# Patient Record
Sex: Female | Born: 1935 | Race: White | Hispanic: No | Marital: Married | Smoking: Never smoker
Health system: Southern US, Community
[De-identification: ages and names within clinical notes are randomized; demographics above are authoritative.]

## PROBLEM LIST (undated history)

## (undated) DIAGNOSIS — E119 Type 2 diabetes mellitus without complications: Secondary | ICD-10-CM

## (undated) DIAGNOSIS — D329 Benign neoplasm of meninges, unspecified: Secondary | ICD-10-CM

## (undated) DIAGNOSIS — I2510 Atherosclerotic heart disease of native coronary artery without angina pectoris: Secondary | ICD-10-CM

## (undated) DIAGNOSIS — I4891 Unspecified atrial fibrillation: Secondary | ICD-10-CM

## (undated) DIAGNOSIS — I10 Essential (primary) hypertension: Secondary | ICD-10-CM

## (undated) DIAGNOSIS — I5030 Unspecified diastolic (congestive) heart failure: Secondary | ICD-10-CM

## (undated) HISTORY — PX: OTHER SURGICAL HISTORY: SHX169

## (undated) HISTORY — PX: APPENDECTOMY: SHX54

## (undated) HISTORY — DX: Unspecified atrial fibrillation: I48.91

## (undated) HISTORY — DX: Benign neoplasm of meninges, unspecified: D32.9

## (undated) HISTORY — PX: CHOLECYSTECTOMY: SHX55

---

## 2014-05-19 DIAGNOSIS — E118 Type 2 diabetes mellitus with unspecified complications: Secondary | ICD-10-CM | POA: Insufficient documentation

## 2014-05-19 DIAGNOSIS — Z8781 Personal history of (healed) traumatic fracture: Secondary | ICD-10-CM | POA: Insufficient documentation

## 2014-05-19 DIAGNOSIS — D320 Benign neoplasm of cerebral meninges: Secondary | ICD-10-CM | POA: Insufficient documentation

## 2014-05-19 DIAGNOSIS — Z8603 Personal history of neoplasm of uncertain behavior: Secondary | ICD-10-CM | POA: Insufficient documentation

## 2014-12-06 DIAGNOSIS — I10 Essential (primary) hypertension: Secondary | ICD-10-CM | POA: Insufficient documentation

## 2015-02-01 ENCOUNTER — Encounter: Payer: Self-pay | Admitting: *Deleted

## 2015-02-01 ENCOUNTER — Emergency Department: Payer: Medicare Other

## 2015-02-01 ENCOUNTER — Emergency Department
Admission: EM | Admit: 2015-02-01 | Discharge: 2015-02-01 | Disposition: A | Discharge status: Home / Self Care | Payer: Medicare Other | Attending: Emergency Medicine | Admitting: Emergency Medicine

## 2015-02-01 DIAGNOSIS — W01198A Fall on same level from slipping, tripping and stumbling with subsequent striking against other object, initial encounter: Secondary | ICD-10-CM | POA: Diagnosis not present

## 2015-02-01 DIAGNOSIS — S233XXA Sprain of ligaments of thoracic spine, initial encounter: Secondary | ICD-10-CM | POA: Insufficient documentation

## 2015-02-01 DIAGNOSIS — Y9389 Activity, other specified: Secondary | ICD-10-CM | POA: Diagnosis not present

## 2015-02-01 DIAGNOSIS — S29012A Strain of muscle and tendon of back wall of thorax, initial encounter: Secondary | ICD-10-CM

## 2015-02-01 DIAGNOSIS — Y998 Other external cause status: Secondary | ICD-10-CM | POA: Insufficient documentation

## 2015-02-01 DIAGNOSIS — S0081XA Abrasion of other part of head, initial encounter: Secondary | ICD-10-CM | POA: Diagnosis not present

## 2015-02-01 DIAGNOSIS — R609 Edema, unspecified: Secondary | ICD-10-CM

## 2015-02-01 DIAGNOSIS — S6291XA Unspecified fracture of right wrist and hand, initial encounter for closed fracture: Secondary | ICD-10-CM | POA: Insufficient documentation

## 2015-02-01 DIAGNOSIS — I10 Essential (primary) hypertension: Secondary | ICD-10-CM | POA: Diagnosis not present

## 2015-02-01 DIAGNOSIS — W19XXXA Unspecified fall, initial encounter: Secondary | ICD-10-CM

## 2015-02-01 DIAGNOSIS — S39012A Strain of muscle, fascia and tendon of lower back, initial encounter: Secondary | ICD-10-CM | POA: Diagnosis not present

## 2015-02-01 DIAGNOSIS — S0083XA Contusion of other part of head, initial encounter: Secondary | ICD-10-CM

## 2015-02-01 DIAGNOSIS — Y9289 Other specified places as the place of occurrence of the external cause: Secondary | ICD-10-CM | POA: Diagnosis not present

## 2015-02-01 DIAGNOSIS — E119 Type 2 diabetes mellitus without complications: Secondary | ICD-10-CM | POA: Insufficient documentation

## 2015-02-01 DIAGNOSIS — S6991XA Unspecified injury of right wrist, hand and finger(s), initial encounter: Secondary | ICD-10-CM | POA: Diagnosis present

## 2015-02-01 DIAGNOSIS — S62101A Fracture of unspecified carpal bone, right wrist, initial encounter for closed fracture: Secondary | ICD-10-CM

## 2015-02-01 DIAGNOSIS — R52 Pain, unspecified: Secondary | ICD-10-CM

## 2015-02-01 HISTORY — DX: Essential (primary) hypertension: I10

## 2015-02-01 HISTORY — DX: Type 2 diabetes mellitus without complications: E11.9

## 2015-02-01 HISTORY — DX: Atherosclerotic heart disease of native coronary artery without angina pectoris: I25.10

## 2015-02-01 MED ORDER — TRAMADOL HCL 50 MG PO TABS: 50.0000 mg | ORAL_TABLET | Freq: Two times a day (BID) | ORAL | Status: DC

## 2015-02-01 MED ORDER — IBUPROFEN 200 MG PO TABS: 200.0000 mg | ORAL_TABLET | Freq: Four times a day (QID) | ORAL | Status: DC | PRN

## 2015-02-01 MED ORDER — ACETAMINOPHEN 500 MG PO TABS
1000.0000 mg | ORAL_TABLET | Freq: Four times a day (QID) | ORAL | Status: DC | PRN
  Administered 2015-02-01: 1000 mg via ORAL
  Filled 2015-02-01: qty 2

## 2015-02-01 NOTE — ED Provider Notes (Signed)
[facility] Emergency Department Provider Note  ____________________________________________  Time seen: Approximately 2:39 PM  I have reviewed the triage vital signs and the nursing notes.   HISTORY  Chief Complaint Fall   HPI [name] is a 81 y.o. female is brought in here from [facility].  Patient states she fell over a trolley when it rolled out in front of her. This caused her to fall forward striking her chin. She also is complaining of pain to her right wrist and mid back. She denies any head injury or loss of consciousness. She states that her to use currently or fitting normally. She denies any visual changes. She denies any nausea or vomiting. Really she rates her pain a 5 out of 10. She denies any previous injury to her right wrist.She was brought to the emergency room by her brother who is not present during the exam.   Past Medical History  Diagnosis Date  . Diabetes mellitus without complication   . Coronary artery disease   . Hypertension     There are no active problems to display for this patient.   Past Surgical History  Procedure Laterality Date  . Cholecystectomy    . Appendectomy      Current Outpatient Rx  Name  Route  Sig  Dispense  Refill  . ibuprofen (ADVIL) 200 MG tablet   Oral   Take 1 tablet (200 mg total) by mouth every 6 (six) hours as needed.   30 tablet   0   . traMADol (ULTRAM) 50 MG tablet   Oral   Take 1 tablet (50 mg total) by mouth 2 (two) times daily.   12 tablet   0     Allergies Review of patient's allergies indicates no known allergies.  No family history on file.  Social History Social History  Substance Use Topics  . Smoking status: Never Smoker   . Smokeless tobacco: None  . Alcohol Use: Yes    Review of Systems Constitutional: No fever/chills Eyes: No visual changes. ENT: No nose or throat pain Cardiovascular: Denies chest pain. Respiratory: Denies shortness of  breath. Gastrointestinal: No abdominal pain.  No nausea, no vomiting.  Genitourinary: Negative for dysuria. Musculoskeletal: Positive for back pain. Skin: Negative for rash. Positive for ecchymosis and swelling of 10 Neurological: Negative for headaches, focal weakness or numbness.  10-point ROS otherwise negative.  ____________________________________________   PHYSICAL EXAM:  VITAL SIGNS: ED Triage Vitals  Enc Vitals Group     BP 02/01/15 1326 185/159 mmHg     Pulse Rate 02/01/15 1326 69     Resp 02/01/15 1326 16     Temp 02/01/15 1326 98.8 F (37.1 C)     Temp Source 02/01/15 1326 Oral     SpO2 02/01/15 1326 97 %     Weight 02/01/15 1326 193 lb (87.544 kg)     Height 02/01/15 1326 5\' 9"  (1.753 m)     Head Cir --      Peak Flow --      Pain Score 02/01/15 1323 5     Pain Loc --      Pain Edu? --      Excl. in [place]? --     Constitutional: Alert and oriented. Well appearing and in no acute distress. Eyes: Conjunctivae are normal. PERRL. EOMI. Head: Atraumatic. Nose: No congestion/rhinnorhea. Neck: No stridor.  No cervical tenderness on palpation and range of motion is nonrestricted Hematological/Lymphatic/Immunilogical: No cervical lymphadenopathy. Cardiovascular: Normal rate,  regular rhythm. Grossly normal heart sounds.  Good peripheral circulation. Respiratory: Normal respiratory effort.  No retractions. Lungs CTAB. Nontender on palpation of the ribs. Gastrointestinal: Soft and nontender. No distention. No abdominal bruits. Musculoskeletal: Facial bones were nontender to palpation with the exception of the 10. Patient is talking without any difficulty. Right wrist is with swelling and minimal deformity. Range of motion is restricted secondary to pain. It is moderate tenderness on palpation. Motor and sensory function intact distally to the injury. Lower thoracic and lumbar area palpated with moderate tenderness on palpation. Bilateral hips were nontender. No tenderness was  noted on palpation of the knees and ankles or feet. No lower extremity tenderness nor edema.  No joint effusions. Neurologic:  Normal speech and language. No gross focal neurologic deficits are appreciated. No gait instability. Skin:  Skin is warm, dry and intact. No rash noted. There is positive abrasion and ecchymosis of the anterior chin Psychiatric: Mood and affect are normal. Speech and behavior are normal.  ____________________________________________   LABS (all labs ordered are listed, but only abnormal results are displayed)  Labs Reviewed - No data to display  RADIOLOGY  CT maxillofacial shows soft tissue swelling anterior mandible but no fractures were seen. CT of cervical spine showed multiple level degenerative changes but no acute fracture or soft tissue swelling. Per radiologist.  Right hand showed acute distal radius and ulna styloid fractures per radiologist and reviewed by me. Thoracic spine x-ray no acute fracture or subluxation per radiologist Lumbar spine showed multilevel degenerative disc disease with lumbar spondylosis. No acute fractures. Right forearm showed fractures of the distal radius and also styloid I, Johnn Hai, personally viewed and evaluated these images (plain radiographs) as part of my medical decision making.   ____________________________________________   PROCEDURES  Procedure(s) performed: None  Critical Care performed: No  ____________________________________________   INITIAL IMPRESSION / ASSESSMENT AND PLAN / ED COURSE  Pertinent labs & imaging results that were available during my care of the patient were reviewed by me and considered in my medical decision making (see chart for details)  CT reports were pending prior to shift change and the remainder of patient's care was turned over to [employer]. Initially patient was extremely upset over forearm x-ray being ordered prior to being seen by a provider. After being seen by  this provider she had multiple other pain complaints related to her fall. Patient refused narcotics prior to x-ray. She was given Tylenol 2 tablets to help with her pain.   ____________________________________________   FINAL CLINICAL IMPRESSION(S) / ED DIAGNOSES  Final diagnoses:  Swelling  Pain  Right wrist fracture, closed, initial encounter  Facial contusion, initial encounter  Upper back strain, initial encounter  Low back strain, initial encounter      Johnn Hai, PA-C 02/02/15 1412  Earleen Newport, MD 02/05/15 [phone]

## 2015-02-01 NOTE — Discharge Instructions (Signed)
Wear splint until evaluation by [facility]

## 2015-02-01 NOTE — ED Notes (Signed)
Pt reports fell over a trolley that rolled in front of her. Pain to right wrist and mid back pain. Bruising noted to chin. No LOC. Pt has ace wrap on wrist from [facility].

## 2015-02-01 NOTE — ED Provider Notes (Signed)
-----------------------------------------   4:44 PM on 02/01/2015 -----------------------------------------   Blood pressure 185/159, pulse 69, temperature 98.8 F (37.1 C), temperature source Oral, resp. rate 16, height 5\' 9"  (1.753 m), weight 193 lb (87.544 kg), SpO2 97 %.  Assuming care from [facility].  In short, [name] is a 81 y.o. female with a chief complaint of Fall .  Refer to the original H&P for additional details.  The current plan of care is to splint the patient's rigtht wrist 2nd to distal radial/ulnar styloid fracture. Advised patient no other acute findings from X-rays. and Ct Scans.Patient advised to follow up with [facility]o Clinic for re-evaluation of fracture right wrist. Patient given prescription for tramadol and ibuprofen.   Sable Feil, PA-C 02/01/15 1658  Nance Pear, MD 02/01/15 1754

## 2015-02-12 ENCOUNTER — Emergency Department
Admission: EM | Admit: 2015-02-12 | Discharge: 2015-02-12 | Disposition: A | Discharge status: Home / Self Care | Payer: Medicare Other | Attending: Emergency Medicine | Admitting: Emergency Medicine

## 2015-02-12 ENCOUNTER — Emergency Department: Payer: Medicare Other

## 2015-02-12 ENCOUNTER — Encounter: Payer: Self-pay | Admitting: Emergency Medicine

## 2015-02-12 ENCOUNTER — Other Ambulatory Visit: Payer: Self-pay

## 2015-02-12 DIAGNOSIS — W01198A Fall on same level from slipping, tripping and stumbling with subsequent striking against other object, initial encounter: Secondary | ICD-10-CM | POA: Diagnosis not present

## 2015-02-12 DIAGNOSIS — S0003XA Contusion of scalp, initial encounter: Secondary | ICD-10-CM | POA: Insufficient documentation

## 2015-02-12 DIAGNOSIS — S0990XA Unspecified injury of head, initial encounter: Secondary | ICD-10-CM | POA: Diagnosis present

## 2015-02-12 DIAGNOSIS — Y998 Other external cause status: Secondary | ICD-10-CM | POA: Diagnosis not present

## 2015-02-12 DIAGNOSIS — E119 Type 2 diabetes mellitus without complications: Secondary | ICD-10-CM | POA: Diagnosis not present

## 2015-02-12 DIAGNOSIS — I10 Essential (primary) hypertension: Secondary | ICD-10-CM | POA: Insufficient documentation

## 2015-02-12 DIAGNOSIS — Y92481 Parking lot as the place of occurrence of the external cause: Secondary | ICD-10-CM | POA: Insufficient documentation

## 2015-02-12 DIAGNOSIS — Y9389 Activity, other specified: Secondary | ICD-10-CM | POA: Diagnosis not present

## 2015-02-12 NOTE — ED Notes (Addendum)
Pt has cast in place to right arm, bruising noted above right eye

## 2015-02-12 NOTE — Discharge Instructions (Signed)
Head Injury °You have received a head injury. It does not appear serious at this time. Headaches and vomiting are common following head injury. It should be easy to awaken from sleeping. Sometimes it is necessary for you to stay in the emergency department for a while for observation. Sometimes admission to the hospital may be needed. After injuries such as yours, most problems occur within the first 24 hours, but side effects may occur up to 7-10 days after the injury. It is important for you to carefully monitor your condition and contact your health care provider or seek immediate medical care if there is a change in your condition. °WHAT ARE THE TYPES OF HEAD INJURIES? °Head injuries can be as minor as a bump. Some head injuries can be more severe. More severe head injuries include: °· A jarring injury to the brain (concussion). °· A bruise of the brain (contusion). This mean there is bleeding in the brain that can cause swelling. °· A cracked skull (skull fracture). °· Bleeding in the brain that collects, clots, and forms a bump (hematoma). °WHAT CAUSES A HEAD INJURY? °A serious head injury is most likely to happen to someone who is in a car wreck and is not wearing a seat belt. Other causes of major head injuries include bicycle or motorcycle accidents, sports injuries, and falls. °HOW ARE HEAD INJURIES DIAGNOSED? °A complete history of the event leading to the injury and your current symptoms will be helpful in diagnosing head injuries. Many times, pictures of the brain, such as CT or MRI are needed to see the extent of the injury. Often, an overnight hospital stay is necessary for observation.  °WHEN SHOULD I SEEK IMMEDIATE MEDICAL CARE?  °You should get help right away if: °· You have confusion or drowsiness. °· You feel sick to your stomach (nauseous) or have continued, forceful vomiting. °· You have dizziness or unsteadiness that is getting worse. °· You have severe, continued headaches not relieved by  medicine. Only take over-the-counter or prescription medicines for pain, fever, or discomfort as directed by your health care provider. °· You do not have normal function of the arms or legs or are unable to walk. °· You notice changes in the black spots in the center of the colored part of your eye (pupil). °· You have a clear or bloody fluid coming from your nose or ears. °· You have a loss of vision. °During the next 24 hours after the injury, you must stay with someone who can watch you for the warning signs. This person should contact local emergency services (911 in the [place] if you have seizures, you become unconscious, or you are unable to wake up. °HOW CAN I PREVENT A HEAD INJURY IN THE FUTURE? °The most important factor for preventing major head injuries is avoiding motor vehicle accidents.  To minimize the potential for damage to your head, it is crucial to wear seat belts while riding in motor vehicles. Wearing helmets while bike riding and playing collision sports (like football) is also helpful. Also, avoiding dangerous activities around the house will further help reduce your risk of head injury.  °WHEN CAN I RETURN TO NORMAL ACTIVITIES AND ATHLETICS? °You should be reevaluated by your health care provider before returning to these activities. If you have any of the following symptoms, you should not return to activities or contact sports until 1 week after the symptoms have stopped: °· Persistent headache. °· Dizziness or vertigo. °· Poor attention and concentration. °· Confusion. °·   Memory problems.  Nausea or vomiting.  Fatigue or tire easily.  Irritability.  Intolerant of bright lights or loud noises.  Anxiety or depression.  Disturbed sleep. MAKE SURE YOU:   Understand these instructions.  Will watch your condition.  Will get help right away if you are not doing well or get worse. Document Released: 04/28/2005 Document Revised: 05/03/2013 Document Reviewed:  01/03/2013 [facility] Patient Information 2015 [place], [place]. This information is not intended to replace advice given to you by your health care provider. Make sure you discuss any questions you have with your health care provider.  Facial or Scalp Contusion  A facial or scalp contusion is a deep bruise on the face or head. Contusions happen when an injury causes bleeding under the skin. Signs of bruising include pain, puffiness (swelling), and discolored skin. The contusion may turn blue, purple, or yellow. HOME CARE  Only take medicines as told by your doctor.  Put ice on the injured area.  Put ice in a plastic bag.  Place a towel between your skin and the bag.  Leave the ice on for 20 minutes, 2-3 times a day. GET HELP IF:  You have bite problems.  You have pain when chewing.  You are worried about your face not healing normally. GET HELP RIGHT AWAY IF:   You have severe pain or a headache and medicine does not help.  You are very tired or confused, or your personality changes.  You throw up (vomit).  You have a nosebleed that will not stop.  You see two of everything (double vision) or have blurry vision.  You have fluid coming from your nose or ear.  You have problems walking or using your arms or legs. MAKE SURE YOU:   Understand these instructions.  Will watch your condition.  Will get help right away if you are not doing well or get worse. Document Released: 04/17/2011 Document Revised: 02/16/2013 Document Reviewed: 12/09/2012 [facility] Patient Information 2015 [place], [place]. This information is not intended to replace advice given to you by your health care provider. Make sure you discuss any questions you have with your health care provider.

## 2015-02-12 NOTE — ED Notes (Signed)
Dr Jimmye Norman went reevaluate pt, pt c/o of back pain, new orders.  Await DC.

## 2015-02-12 NOTE — ED Provider Notes (Addendum)
[facility] Emergency Department Provider Note     Time seen: ----------------------------------------- 10:57 AM on 02/12/2015 -----------------------------------------    I have reviewed the triage vital signs and the nursing notes.   HISTORY  Chief Complaint Fall    HPI [name] is a 81 y.o. female who presents ER for a fall. Patient states she was bending over working on the dishwasher when she lost her balance and struck her head on the floor. Patient states she was in the floor throughout the night, her neighbor. She fell on September 23 in a parking lot when legs and fractured her right wrist and bruised her chin. He complains currently was had pain in right frontal scalp trauma.   Past Medical History  Diagnosis Date  . Diabetes mellitus without complication   . Coronary artery disease   . Hypertension     There are no active problems to display for this patient.   Past Surgical History  Procedure Laterality Date  . Cholecystectomy    . Appendectomy      Allergies Review of patient's allergies indicates no known allergies.  Social History Social History  Substance Use Topics  . Smoking status: Never Smoker   . Smokeless tobacco: Not on file  . Alcohol Use: Yes    Review of Systems Constitutional: Negative for fever. Eyes: Negative for visual changes. ENT: Negative for sore throat. Cardiovascular: Negative for chest pain. Respiratory: Negative for shortness of breath. Gastrointestinal: Negative for abdominal pain, vomiting and diarrhea. Genitourinary: Negative for dysuria. Musculoskeletal: Negative for back pain. Skin: Negative for rash. Neurological: Positive for headache  10-point ROS otherwise negative.  ____________________________________________   PHYSICAL EXAM:  VITAL SIGNS: ED Triage Vitals  Enc Vitals Group     BP --      Pulse --      Resp --      Temp --      Temp src --      SpO2 --      Weight  --      Height --      Head Cir --      Peak Flow --      Pain Score --      Pain Loc --      Pain Edu? --      Excl. in [place]? --     Constitutional: Alert and oriented. Well appearing and in no distress. Eyes: Conjunctivae are normal. PERRL. Normal extraocular movements. ENT   Head: Right periorbital scalp hematoma is evident. There is ecchymosis to the chin that she states is from remote fall   Nose: No congestion/rhinnorhea.   Mouth/Throat: Mucous membranes are moist.   Neck: No stridor. Cardiovascular: Normal rate, regular rhythm. Normal and symmetric distal pulses are present in all extremities. No murmurs, rubs, or gallops. Respiratory: Normal respiratory effort without tachypnea nor retractions. Breath sounds are clear and equal bilaterally. No wheezes/rales/rhonchi. Gastrointestinal: Soft and nontender. No distention. No abdominal bruits.  Musculoskeletal: Nontender with normal range of motion in all extremities with the exception of her right forearm and wrist that is in a cast Neurologic:  Normal speech and language. No gross focal neurologic deficits are appreciated. Speech is normal. No gait instability. Skin:  Skin is warm, dry and intact. No rash noted. Psychiatric: Mood and affect are normal. Speech and behavior are normal. Patient exhibits appropriate insight and judgment. ____________________________________________  EKG: Interpreted by me. Normal sinus rhythm with rate of 81 bpm, normal PR interval,  normal QS with, normal QT interval. Nonspecific ST-T wave changes.  ____________________________________________  ED COURSE:  Pertinent labs & imaging results that were available during my care of the patient were reviewed by me and considered in my medical decision making (see chart for details). Patient is in no acute distress, repeat fall. We'll obtain CT imaging. She is alert and oriented, denies any other complaints does not want pain medicine this  time. ____________________________________________   RADIOLOGY Images were viewed by me  CT head IMPRESSION: 1. Right forehead/scalp hematoma without underlying fracture. 2. Three cm right anterior convexity calcified meningioma re- identified with stable mass effect and vasogenic edema in the anterior right frontal lobe. 3. No new intracranial abnormality.  IMPRESSION: 1. No acute fracture or listhesis identified in the lumbar spine. 2. Widespread calcified atherosclerosis of the aorta. ____________________________________________  FINAL ASSESSMENT AND PLAN  Fall, minor head injury, scalp hematoma  Plan: Patient with imaging as dictated above. Here patient is stable, no serious injuries are appreciated. She is stable for outpatient follow-up with her doctor. Patient had been having some back spasms, therefore imaging or lumbar sacral spine was performed. There was no acute fracture, she stable for discharge   Earleen Newport, MD   Earleen Newport, MD 02/12/15 [place], MD 02/12/15 1236

## 2015-02-12 NOTE — ED Notes (Addendum)
Pt to ED via EMS transport after falling last night and hitting head on the floor, states she was in the floor throughout the night and found by her neighbor this am, pt states she fell on the 9/23 in the parking lot at [facility] and fx right wrist and bruised chin, today pt has bruising above right eye and c/o pain in lower back, a&o x 4 upon arrival to ED, states she thinks she may have blacked out at time of fall

## 2015-06-25 DIAGNOSIS — E1121 Type 2 diabetes mellitus with diabetic nephropathy: Secondary | ICD-10-CM | POA: Insufficient documentation

## 2015-08-01 ENCOUNTER — Encounter: Payer: Self-pay | Admitting: Podiatry

## 2015-08-01 ENCOUNTER — Ambulatory Visit (INDEPENDENT_AMBULATORY_CARE_PROVIDER_SITE_OTHER): Payer: Medicare Other | Admitting: Podiatry

## 2015-08-01 ENCOUNTER — Ambulatory Visit: Payer: Medicare Other

## 2015-08-01 VITALS — BP 147/88 | HR 80 | Resp 16

## 2015-08-01 DIAGNOSIS — E119 Type 2 diabetes mellitus without complications: Secondary | ICD-10-CM

## 2015-08-01 DIAGNOSIS — M79676 Pain in unspecified toe(s): Secondary | ICD-10-CM | POA: Diagnosis not present

## 2015-08-01 DIAGNOSIS — L84 Corns and callosities: Secondary | ICD-10-CM | POA: Diagnosis not present

## 2015-08-01 DIAGNOSIS — B351 Tinea unguium: Secondary | ICD-10-CM | POA: Diagnosis not present

## 2015-08-01 NOTE — Addendum Note (Signed)
Addended by: Rip Harbour on: 08/01/2015 01:31 PM   Modules accepted: Orders

## 2015-08-01 NOTE — Progress Notes (Signed)
   Subjective:    Patient ID: [name], female    DOB: [birth year], 82 y.o.   MRN: [id number]  HPI: She presents today with a chief complaint of painful elongated toenails and calluses to the distal aspect of the third digits bilaterally. She states that a year and a half ago she moved from [place] to hear and has not yet developed a relationship with a podiatrist. She relates therapy really don't hurt that is big and flat.    Review of Systems  HENT: Positive for hearing loss.   Cardiovascular: Positive for leg swelling.  Genitourinary: Positive for frequency.  Musculoskeletal: Positive for myalgias, back pain and gait problem.  Neurological: Positive for numbness.  All other systems reviewed and are negative.      Objective:   Physical Exam: Vital signs are stable she is alert and oriented 3 no apparent distress. Pulses are strongly palpable. Neurologic sensorium is intact. Deep tendon reflexes are not elicitable muscle strength +5 over 5 dorsiflexion plantar flexors and inverters everters onto the musculatures intact. Orthopedic evaluation demonstrates pes planus bilateral mallet to deformity third bilateral toenails are thick yellow dystrophic onychomycotic. Porokeratotic lesion or distal clavus third digit bilaterally. No open lesions or wounds. Patient deferred x-rays.        Assessment & Plan:  Diabetic foot care with mallet toe deformity third bilateral distal clavus. Non-complicated. Toenails are thick and painful.  Plan: Debridement of toenails and calluses bilateral.

## 2015-09-10 DIAGNOSIS — I358 Other nonrheumatic aortic valve disorders: Secondary | ICD-10-CM | POA: Insufficient documentation

## 2015-11-05 ENCOUNTER — Ambulatory Visit (INDEPENDENT_AMBULATORY_CARE_PROVIDER_SITE_OTHER): Payer: Medicare Other | Admitting: Podiatry

## 2015-11-05 ENCOUNTER — Encounter: Payer: Self-pay | Admitting: Podiatry

## 2015-11-05 DIAGNOSIS — B351 Tinea unguium: Secondary | ICD-10-CM

## 2015-11-05 DIAGNOSIS — E119 Type 2 diabetes mellitus without complications: Secondary | ICD-10-CM | POA: Diagnosis not present

## 2015-11-05 DIAGNOSIS — M79676 Pain in unspecified toe(s): Secondary | ICD-10-CM

## 2015-11-05 NOTE — Progress Notes (Signed)
She presents today with chief complaint of painful elongated toenails.  Objective: Vital signs are stable she is alert and oriented 3 pulses remain palpable neurologic sensorium is intact. Toenails are thick yellow dystrophic like mycotic.  Assessment: Pain and limp secondary to onychomycosis.  Plan: Debridement of toenails 1 through 5 bilateral covered service secondary to pain.

## 2016-01-30 ENCOUNTER — Ambulatory Visit: Payer: Medicare Other | Admitting: Podiatry

## 2016-06-27 DIAGNOSIS — M549 Dorsalgia, unspecified: Secondary | ICD-10-CM | POA: Insufficient documentation

## 2016-07-14 DIAGNOSIS — I4891 Unspecified atrial fibrillation: Secondary | ICD-10-CM | POA: Insufficient documentation

## 2016-07-14 DIAGNOSIS — I4819 Other persistent atrial fibrillation: Secondary | ICD-10-CM | POA: Insufficient documentation

## 2016-07-23 ENCOUNTER — Ambulatory Visit: Payer: Medicare Other | Attending: Orthopedic Surgery | Admitting: Physical Therapy

## 2016-07-23 DIAGNOSIS — G8929 Other chronic pain: Secondary | ICD-10-CM | POA: Diagnosis present

## 2016-07-23 DIAGNOSIS — R262 Difficulty in walking, not elsewhere classified: Secondary | ICD-10-CM | POA: Insufficient documentation

## 2016-07-23 DIAGNOSIS — M545 Low back pain: Secondary | ICD-10-CM | POA: Insufficient documentation

## 2016-07-23 DIAGNOSIS — R5381 Other malaise: Secondary | ICD-10-CM | POA: Diagnosis present

## 2016-07-24 NOTE — Therapy (Signed)
[facility] [address], [place], [address] Phone: [phone]   Fax:  [phone]  Physical Therapy Evaluation  Patient Details  Name: [name] MRN: [id number] Date of Birth: [birth year] No Data Recorded  Encounter Date: 07/23/2016      PT End of Session - 07/23/16 1432    Visit Number 1   Number of Visits 17   Date for PT Re-Evaluation 10/15/16   PT Start Time 1127   PT Stop Time 1205   PT Time Calculation (min) 38 min   Activity Tolerance Patient tolerated treatment well   Behavior During Therapy [facility] for tasks assessed/performed      Past Medical History:  Diagnosis Date  . Coronary artery disease   . Diabetes mellitus without complication ([place])   . Hypertension     Past Surgical History:  Procedure Laterality Date  . APPENDECTOMY    . CHOLECYSTECTOMY      There were no vitals filed for this visit.       Subjective Assessment - 07/23/16 1442    Subjective Patient was involved in an accident roughly 2 years ago where "something" hit her and caused her to fall and break her R wrist. She did not immediately have lower back pain, but developed fairly severe low back pain within 2 days (x-rays showed no fracture). She had difficulty walking past the chair for roughly 2 months. This progressively improved and returned to close to normal. She was largely symptom free until this past Thanksgiving when she was preparing meals for several days and again felt fairly intense lower back pain..   Limitations Walking;Standing;House hold activities   Diagnostic tests X-ray image that per PT appears to have collapsed vertebrae but unclear what level.    Patient Stated Goals To "walk taller" and become more active.    Currently in Pain? Yes  Patient reports intermittent mid to low back pain that is non-specific, she does not provide numeric rating for this. It does not frequently radiate into her LEs       TUG - 15.13 seconds with a cane  17m walk test - 12.16 seconds  5x sit to stand with hands - 19.38 seconds   Educated patient on theraband rows with red t-band x 10 Supine bridging with cuing for appropriate foot placement and pushing through heels x 5 (felt in gluteals and anterior thigh)   SLR- negative  Slump- negative  No pain reported with hip IR/ER bilaterally, no deficits in ROM noted.                          PT Education - 07/23/16 1441    Education provided Yes   Education Details Provided HEP and progression of exercises in clinic.    Person(s) Educated Patient   Methods Explanation;Demonstration;Handout   Comprehension Verbalized understanding;Returned demonstration             PT Long Term Goals - 07/24/16 1329      PT LONG TERM GOAL #1   Title Patient will demonstrate TUG time of less than 12 seconds with AD to demonstrate decreased falls risk.    Baseline 15.13 seconds    Time 6   Period Weeks   Status New     PT LONG TERM GOAL #2   Title Patient will demonstrate 33m walk time of less than 10 seconds to demonstrate decreased falls risk.    Baseline  12.16 seconds    Time 6   Period Weeks   Status New     PT LONG TERM GOAL #3   Title Patient will demonstrate 5x sit to stand time of less than 13 seconds to demonstrate improved LE strength and power.    Baseline 19.38 seconds    Time 6   Period Weeks   Status New               Plan - 07/23/16 1434    Clinical Impression Statement Patient arrives to clinic roughly 30 minutes late for appointment, thus this evaluation is quite limited. It appears her primary complaint is increasing mid to lower back pain associated with vertebral body height loss and progressive LE weakness from age/sarcopenia. She appears to have significant decline in LE strength and power (requires UEs to stand from chair). She also requires [facility] for mobility currently and is considered at high risk for  falling by outcome measures from this session. She would benefit from skilled PT services to increase her stamina and decrease her falls risk.    Rehab Potential Good   PT Frequency 2x / week   PT Duration 8 weeks   PT Treatment/Interventions Aquatic Therapy;Iontophoresis 4mg /ml Dexamethasone;Cryotherapy;Electrical Stimulation;Ultrasound;Moist Heat;Stair training;Gait training;Therapeutic exercise;Balance training;Therapeutic activities;Patient/family education;Manual techniques   PT Next Visit Plan Progress gluteal strengthening program, mid scapular strengthening.    PT Home Exercise Plan Supine bridging, rows with red t-band   Consulted and Agree with Plan of Care Patient      Patient will benefit from skilled therapeutic intervention in order to improve the following deficits and impairments:  Abnormal gait, Pain, Improper body mechanics, Difficulty walking, Decreased balance, Decreased strength, Decreased endurance, Decreased activity tolerance  Visit Diagnosis: Physical deconditioning - Plan: PT plan of care cert/re-cert  Difficulty in walking, not elsewhere classified - Plan: PT plan of care cert/re-cert  Chronic midline low back pain without sciatica - Plan: PT plan of care cert/re-cert      G-Codes - 75/44/92 1433    Functional Assessment Tool Used (Outpatient Only) TUG, 5x sit to stand, 22m walk   Functional Limitation Mobility: Walking and moving around   Mobility: Walking and Moving Around Current Status (E1007) At least 20 percent but less than 40 percent impaired, limited or restricted   Mobility: Walking and Moving Around Goal Status [phone]) At least 1 percent but less than 20 percent impaired, limited or restricted       Problem List Patient Active Problem List   Diagnosis Date Noted  . Macroalbuminuric diabetic nephropathy ([place]) 06/25/2015  . Essential (primary) hypertension 12/06/2014  . Cerebral meningioma ([place]) 05/19/2014  . Complication of diabetes mellitus  ([place]) 05/19/2014  . History of neoplasm of bladder 05/19/2014  . Personal history of healed traumatic fracture 05/19/2014   Royce Macadamia PT, DPT, CSCS    07/24/2016, 1:32 PM  [place] [address] [place], [address] Phone: [phone]   Fax:  [phone]  Name: [name] MRN: [id number] Date of Birth: [birth year]

## 2016-07-25 ENCOUNTER — Encounter: Payer: Medicare (Managed Care) | Admitting: Physical Therapy

## 2016-07-28 ENCOUNTER — Ambulatory Visit: Payer: Medicare Other | Admitting: Physical Therapy

## 2016-07-29 ENCOUNTER — Encounter: Payer: Medicare (Managed Care) | Admitting: Physical Therapy

## 2016-07-30 ENCOUNTER — Ambulatory Visit: Payer: Medicare Other | Admitting: Physical Therapy

## 2016-07-30 DIAGNOSIS — R5381 Other malaise: Secondary | ICD-10-CM

## 2016-07-30 DIAGNOSIS — M545 Low back pain, unspecified: Secondary | ICD-10-CM

## 2016-07-30 DIAGNOSIS — G8929 Other chronic pain: Secondary | ICD-10-CM

## 2016-07-30 DIAGNOSIS — R262 Difficulty in walking, not elsewhere classified: Secondary | ICD-10-CM

## 2016-07-30 NOTE — Therapy (Signed)
[facility] [address] [place], [address] Phone: [phone]   Fax:  [phone]  Physical Therapy Treatment  Patient Details  Name: [name] MRN: [id number] Date of Birth: 1930/08/27 No Data Recorded  Encounter Date: 07/30/2016      PT End of Session - 07/30/16 1049    Visit Number 2   Number of Visits 17   Date for PT Re-Evaluation 10/15/16   PT Start Time 1042   PT Stop Time 1125   PT Time Calculation (min) 43 min   Activity Tolerance Patient tolerated treatment well   Behavior During Therapy [facility] for tasks assessed/performed      Past Medical History:  Diagnosis Date  . Coronary artery disease   . Diabetes mellitus without complication ([place])   . Hypertension     Past Surgical History:  Procedure Laterality Date  . APPENDECTOMY    . CHOLECYSTECTOMY      There were no vitals filed for this visit.      Subjective Assessment - 07/30/16 1045    Subjective Patient reports she has been performing her HEP, no new falls. She was recently diagnosed with a-fib, has been feeling more lethargic lately.    Limitations Walking;Standing;House hold activities   Diagnostic tests X-ray image that per PT appears to have collapsed vertebrae but unclear what level.    Patient Stated Goals To "walk taller" and become more active.    Currently in Pain? No/denies  Reports more discomfort primarily with getting in and out of bed.       TRX Sit to stands with blue foam x 12, progressed to 6 with blue foam, 4 without to chair   Checked BP - 230/80, after 4 minutes rest 199/83 pulse rate 73-75 bpm -- after 6 minutes rest - 167/84 HR -76 (patient reports this is much more reflective of her normal values) .   Mini squats with HHA x 8 repetitions (continuing to monitor vitals and symptoms) x 2 sets   Standing hip taps (modified after unable to properly complete hip abductions) x8 for 2 sets   Measured BP -  213/84-- at this point discontinued therapy and called cardiology office who encouraged holding therapy until patient began new medication and BP was under better regulation.                            PT Education - 07/30/16 1049    Education provided Yes   Education Details Will hold on PT services until her BP regulation is improved.    Person(s) Educated Patient   Methods Explanation;Demonstration   Comprehension Verbalized understanding;Returned demonstration             PT Long Term Goals - 07/24/16 1329      PT LONG TERM GOAL #1   Title Patient will demonstrate TUG time of less than 12 seconds with AD to demonstrate decreased falls risk.    Baseline 15.13 seconds    Time 6   Period Weeks   Status New     PT LONG TERM GOAL #2   Title Patient will demonstrate 39m walk time of less than 10 seconds to demonstrate decreased falls risk.    Baseline 12.16 seconds    Time 6   Period Weeks   Status New     PT LONG TERM GOAL #3   Title Patient will demonstrate 5x sit to stand  time of less than 13 seconds to demonstrate improved LE strength and power.    Baseline 19.38 seconds    Time 6   Period Weeks   Status New               Plan - 07/30/16 1049    Clinical Impression Statement Patient demonstrating good muscular tolerance for strengthening, but noted to be dangerously hypertensive (systolic of 073) which reduced with rest, but persisted in rising throughout session. Given her new diagnosis of a-fib, called her cardiology office and concluded she would be best to change her medication and discontinue therapy for now until her BP is more stable.    Rehab Potential Good   PT Frequency 2x / week   PT Duration 8 weeks   PT Treatment/Interventions Aquatic Therapy;Iontophoresis 4mg /ml Dexamethasone;Cryotherapy;Electrical Stimulation;Ultrasound;Moist Heat;Stair training;Gait training;Therapeutic exercise;Balance training;Therapeutic  activities;Patient/family education;Manual techniques   PT Next Visit Plan Progress gluteal strengthening program, mid scapular strengthening.    PT Home Exercise Plan Supine bridging, rows with red t-band   Consulted and Agree with Plan of Care Patient      Patient will benefit from skilled therapeutic intervention in order to improve the following deficits and impairments:  Abnormal gait, Pain, Improper body mechanics, Difficulty walking, Decreased balance, Decreased strength, Decreased endurance, Decreased activity tolerance  Visit Diagnosis: Physical deconditioning  Difficulty in walking, not elsewhere classified  Chronic midline low back pain without sciatica     Problem List Patient Active Problem List   Diagnosis Date Noted  . Macroalbuminuric diabetic nephropathy ([place]) 06/25/2015  . Essential (primary) hypertension 12/06/2014  . Cerebral meningioma ([place]) 05/19/2014  . Complication of diabetes mellitus ([place]) 05/19/2014  . History of neoplasm of bladder 05/19/2014  . Personal history of healed traumatic fracture 05/19/2014   Royce Macadamia PT, DPT, CSCS    07/30/2016, 3:15 PM  [facility] [address] [place], [address] Phone: [phone]   Fax:  [phone]  Name: [name] MRN: [id number] Date of Birth: 02-21-1931

## 2016-07-30 NOTE — Patient Instructions (Addendum)
TRX Sit to stands with blue foam x 12, progressed to 6 with blue foam, 4 without to chair   Checked BP - 230/80, after 4 minutes rest 199/83 pulse rate 73-75 bpm -- after 6 minutes rest - 167/84 HR -76 (patient reports this is much more reflective of her normal values) .   Mini squats with HHA x 8 repetitions (continuing to monitor vitals and symptoms) x 2 sets   Standing hip taps (modified after unable to properly complete hip abductions) x8 for 2 sets   Measured BP - 213/84

## 2016-08-04 ENCOUNTER — Ambulatory Visit: Payer: Medicare Other | Admitting: Physical Therapy

## 2016-08-06 ENCOUNTER — Ambulatory Visit: Payer: Medicare Other | Admitting: Physical Therapy

## 2016-08-11 ENCOUNTER — Ambulatory Visit: Payer: Medicare Other | Admitting: Physical Therapy

## 2016-08-13 ENCOUNTER — Encounter: Payer: Medicare (Managed Care) | Admitting: Physical Therapy

## 2016-08-18 ENCOUNTER — Encounter: Payer: Medicare Other | Admitting: Physical Therapy

## 2016-08-20 ENCOUNTER — Encounter: Payer: Medicare (Managed Care) | Admitting: Physical Therapy

## 2016-08-25 ENCOUNTER — Encounter: Payer: Medicare (Managed Care) | Admitting: Physical Therapy

## 2016-08-27 ENCOUNTER — Encounter: Payer: Medicare (Managed Care) | Admitting: Physical Therapy

## 2016-08-27 DIAGNOSIS — G5601 Carpal tunnel syndrome, right upper limb: Secondary | ICD-10-CM | POA: Insufficient documentation

## 2016-09-01 ENCOUNTER — Encounter: Payer: Medicare (Managed Care) | Admitting: Physical Therapy

## 2016-09-03 ENCOUNTER — Encounter: Payer: Medicare (Managed Care) | Admitting: Physical Therapy

## 2016-09-08 ENCOUNTER — Encounter: Payer: Medicare (Managed Care) | Admitting: Physical Therapy

## 2016-10-16 ENCOUNTER — Ambulatory Visit (INDEPENDENT_AMBULATORY_CARE_PROVIDER_SITE_OTHER): Payer: Medicare Other | Admitting: Podiatry

## 2016-10-16 ENCOUNTER — Encounter: Payer: Self-pay | Admitting: Podiatry

## 2016-10-16 DIAGNOSIS — B351 Tinea unguium: Secondary | ICD-10-CM | POA: Diagnosis not present

## 2016-10-16 DIAGNOSIS — L84 Corns and callosities: Secondary | ICD-10-CM

## 2016-10-16 DIAGNOSIS — M79676 Pain in unspecified toe(s): Secondary | ICD-10-CM

## 2016-10-16 DIAGNOSIS — E119 Type 2 diabetes mellitus without complications: Secondary | ICD-10-CM | POA: Diagnosis not present

## 2016-10-16 NOTE — Progress Notes (Signed)
This patient returns to the office follow-up for long thick nails. This patient states that she has growth occurring under the nails that is causing concern. She says that she is a diabetic and requests an exam and treatment for her diabetic feet. She states that the nails are thick and painful walking and wearing her shoes. She also relates having a callus on the tip of both third toes. She presents the office today for an evaluation and treatment of her diabetic feet  GENERAL APPEARANCE: Alert, conversant. Appropriately groomed. No acute distress.  VASCULAR: Pedal pulses are  palpable at  [facility] and PT bilateral.  Capillary refill time is immediate to all digits,  Normal temperature gradient.   NEUROLOGIC: sensation is normal to 5.07 monofilament at 5/5 sites bilateral.  Light touch is intact bilateral, Muscle strength normal.  MUSCULOSKELETAL: acceptable muscle strength, tone and stability bilateral.  Intrinsic muscluature intact bilateral.  Rectus appearance of foot and digits noted bilateral. Mallet toe 3rd toe  B/L  DERMATOLOGIC: skin color, texture, and turgor are within normal limits.  No preulcerative lesions or ulcers  are seen, no interdigital maceration noted.  No open lesions present.   No drainage noted. Distal clavi 3rd toes  B/L NAILS  thick disfigured discolored nails with small debris noted and her first, third and fourth toes are especially thick.  Onychomycosis  B/L  Clavi 3rd toe B/L  ROV  debridement of long thick painful nails. Attention was provided to removing the subungual debris from her fungus nails. Debridement of distal clavi I third toes bilaterally   RTC 3 months   Gardiner Barefoot DPM

## 2016-12-04 IMAGING — CR DG HAND COMPLETE 3+V*R*
1 series · 3 of 3 positions shown · non-contrast
Comparison: None.

CLINICAL DATA: Status post fall today. Right wrist pain. Initial
encounter.

EXAM:
RIGHT HAND - COMPLETE 3+ VIEW

[Series 1: dg hand complete right · 0.14mm/px · 3 of 3 slices shown]
[im 1/3]
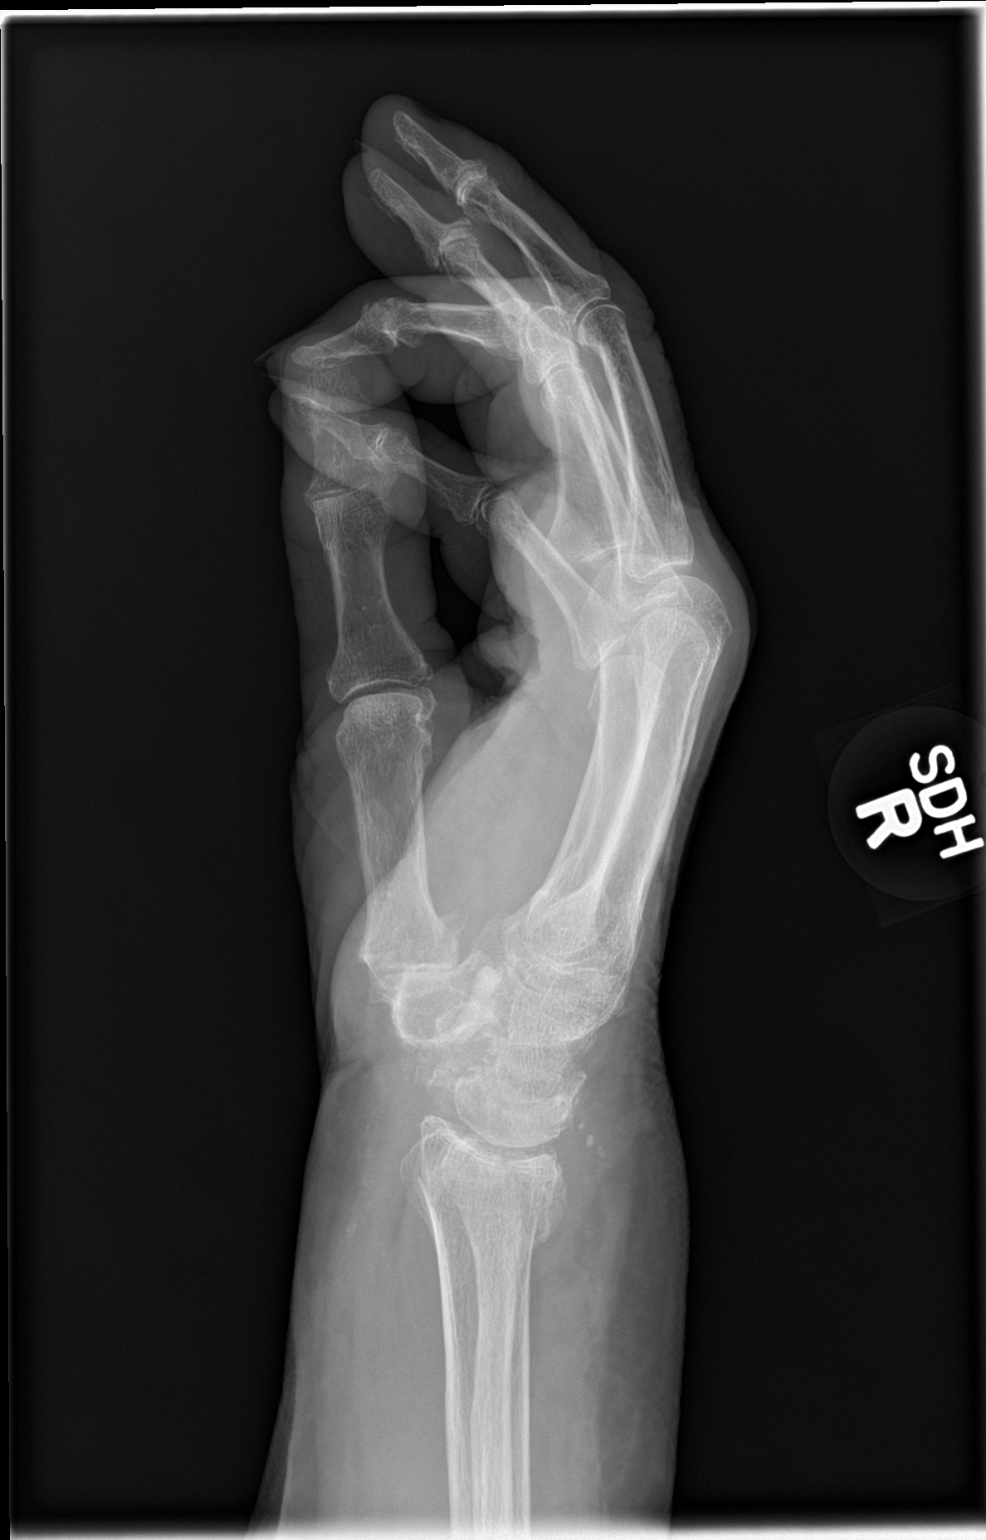
[im 2/3]
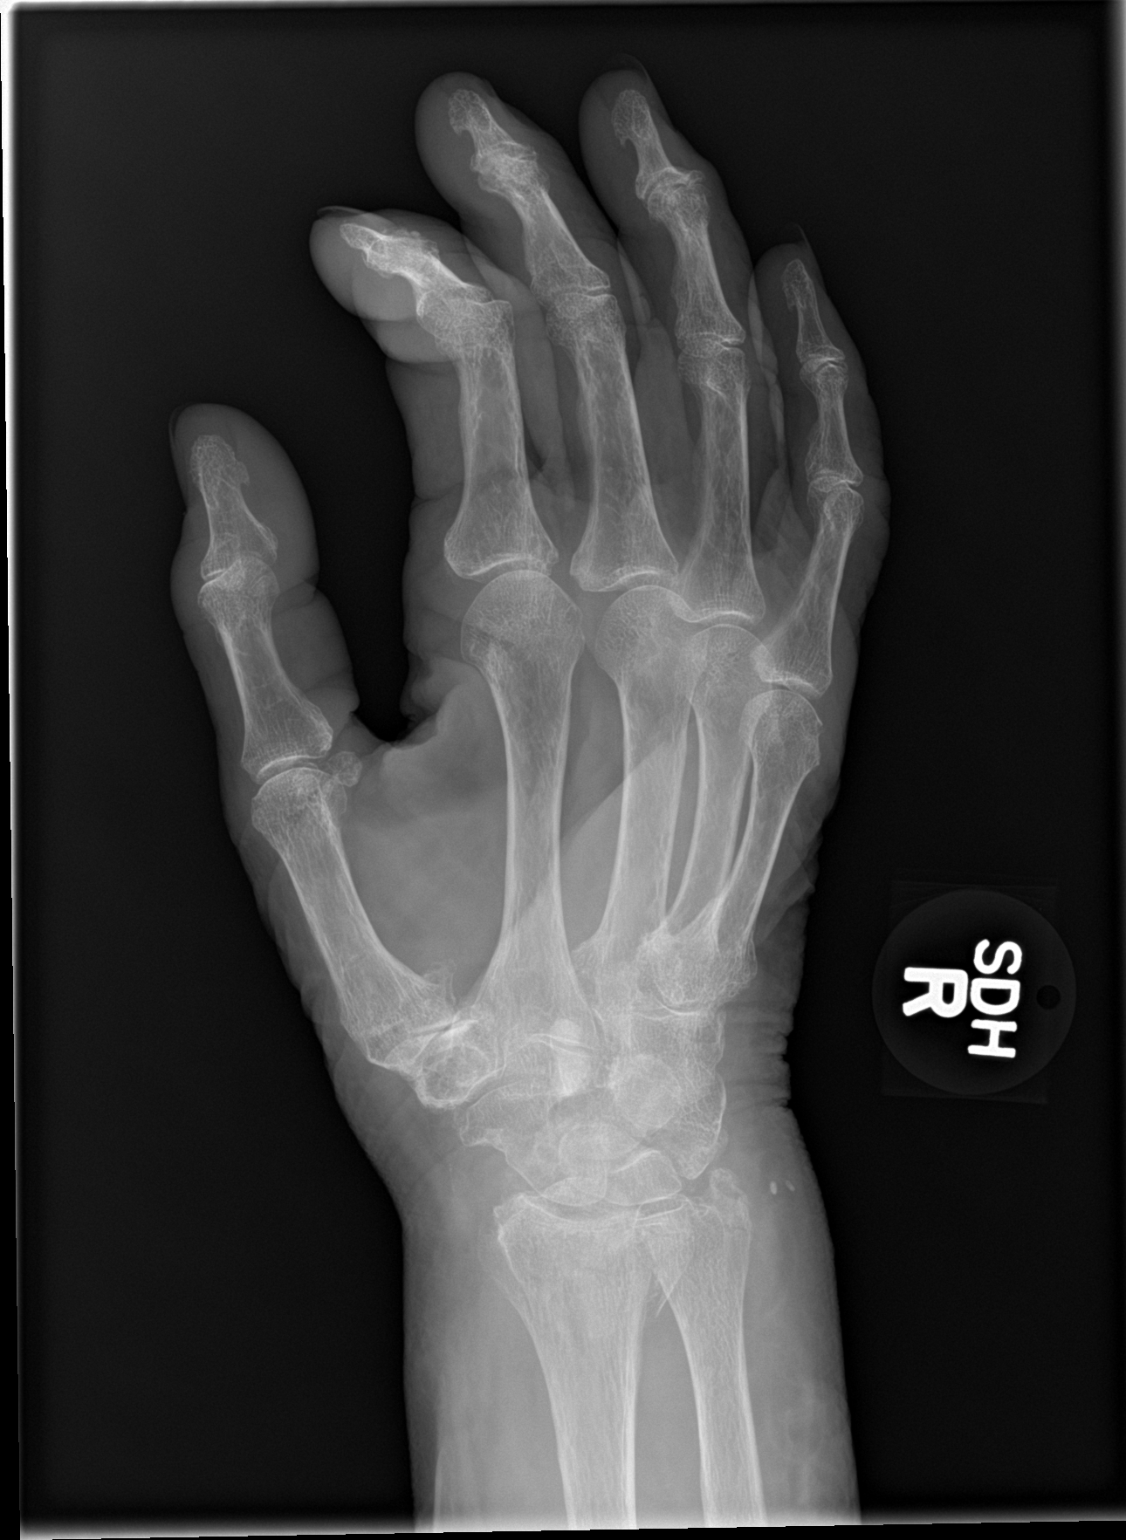
[im 3/3]
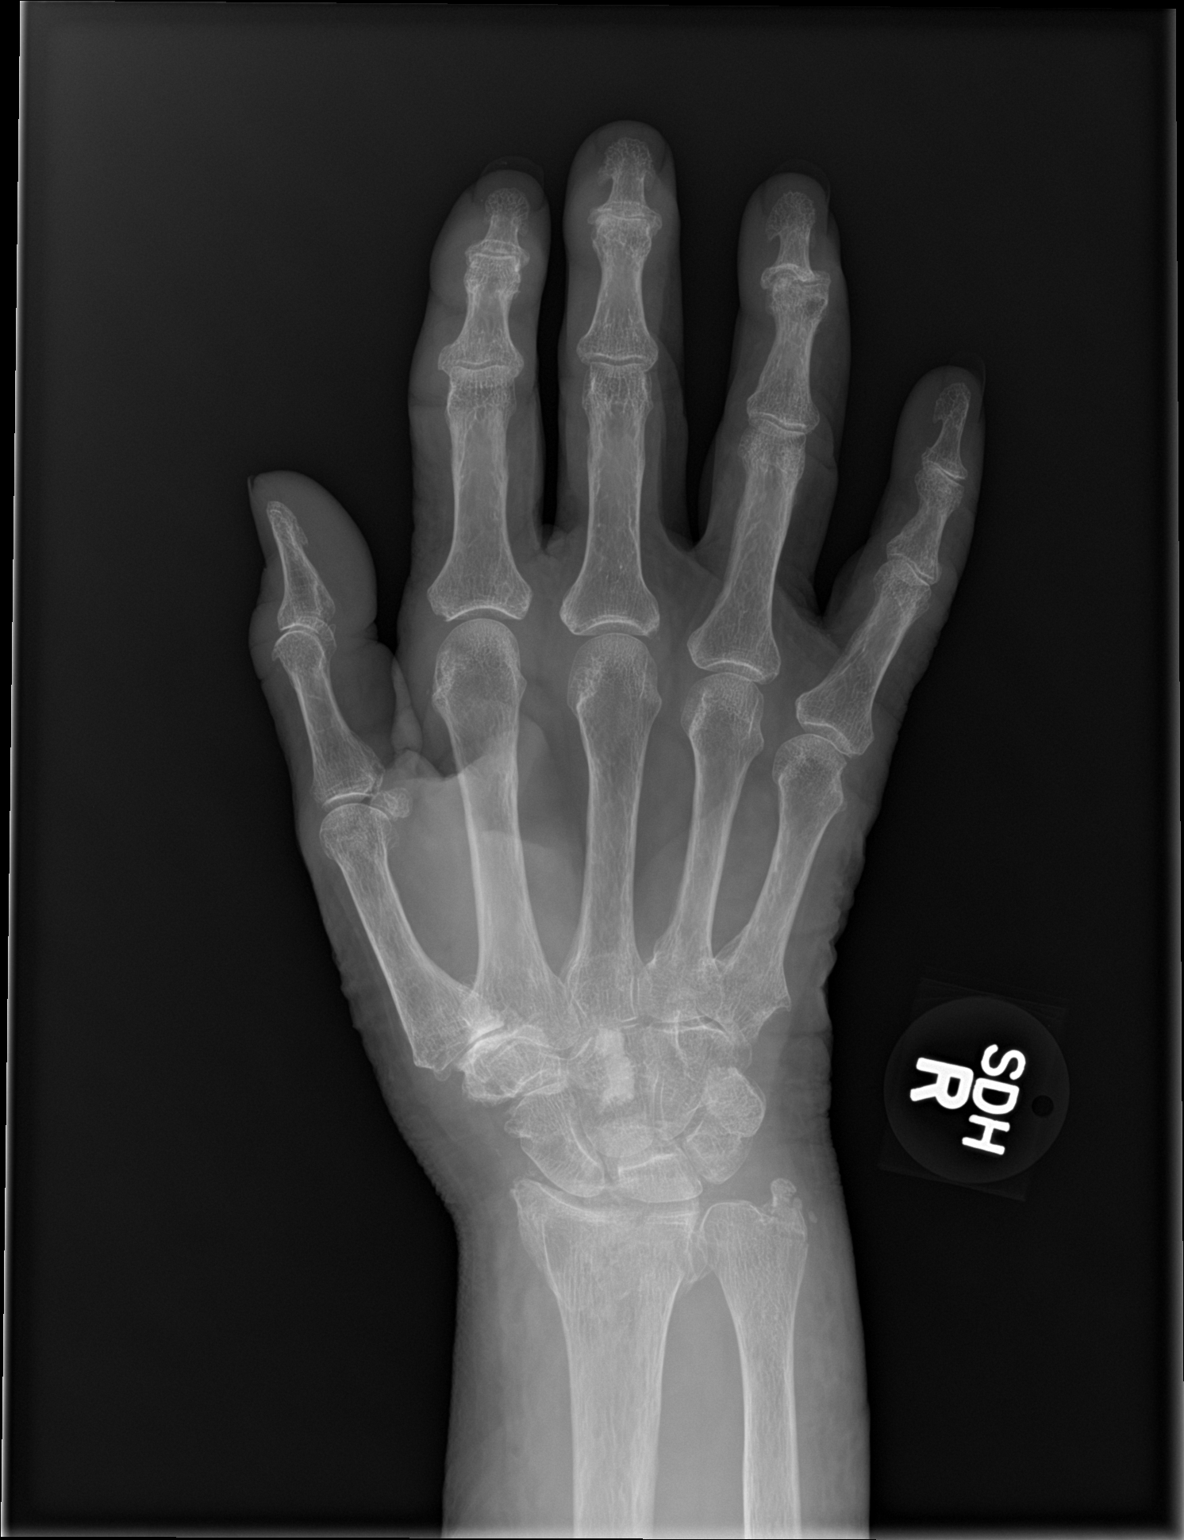

[3 of 3 positions shown; findings below may reference images not displayed]

FINDINGS: The patient has a mildly impacted and dorsally angulated fracture of
the distal radius with intra-articular extension. A nondisplaced
ulnar styloid fracture is also seen. No other fracture is
identified. Scattered degenerative change is most notable at the
first CMC joint and DIP joints of the long and ring fingers.
Sclerotic lesion projecting in the capitate is likely a bone island.
IMPRESSION: Acute distal radius and ulnar styloid fractures with associated soft
tissue swelling.

Osteoarthritis.

## 2016-12-04 IMAGING — CR DG FOREARM 2V*R*
1 series · 1 of 1 positions shown · non-contrast
Comparison: None.

CLINICAL DATA: Right wrist forearm pain after falling over a
trolley today.

EXAM:
RIGHT FOREARM - 2 VIEW

[ap]
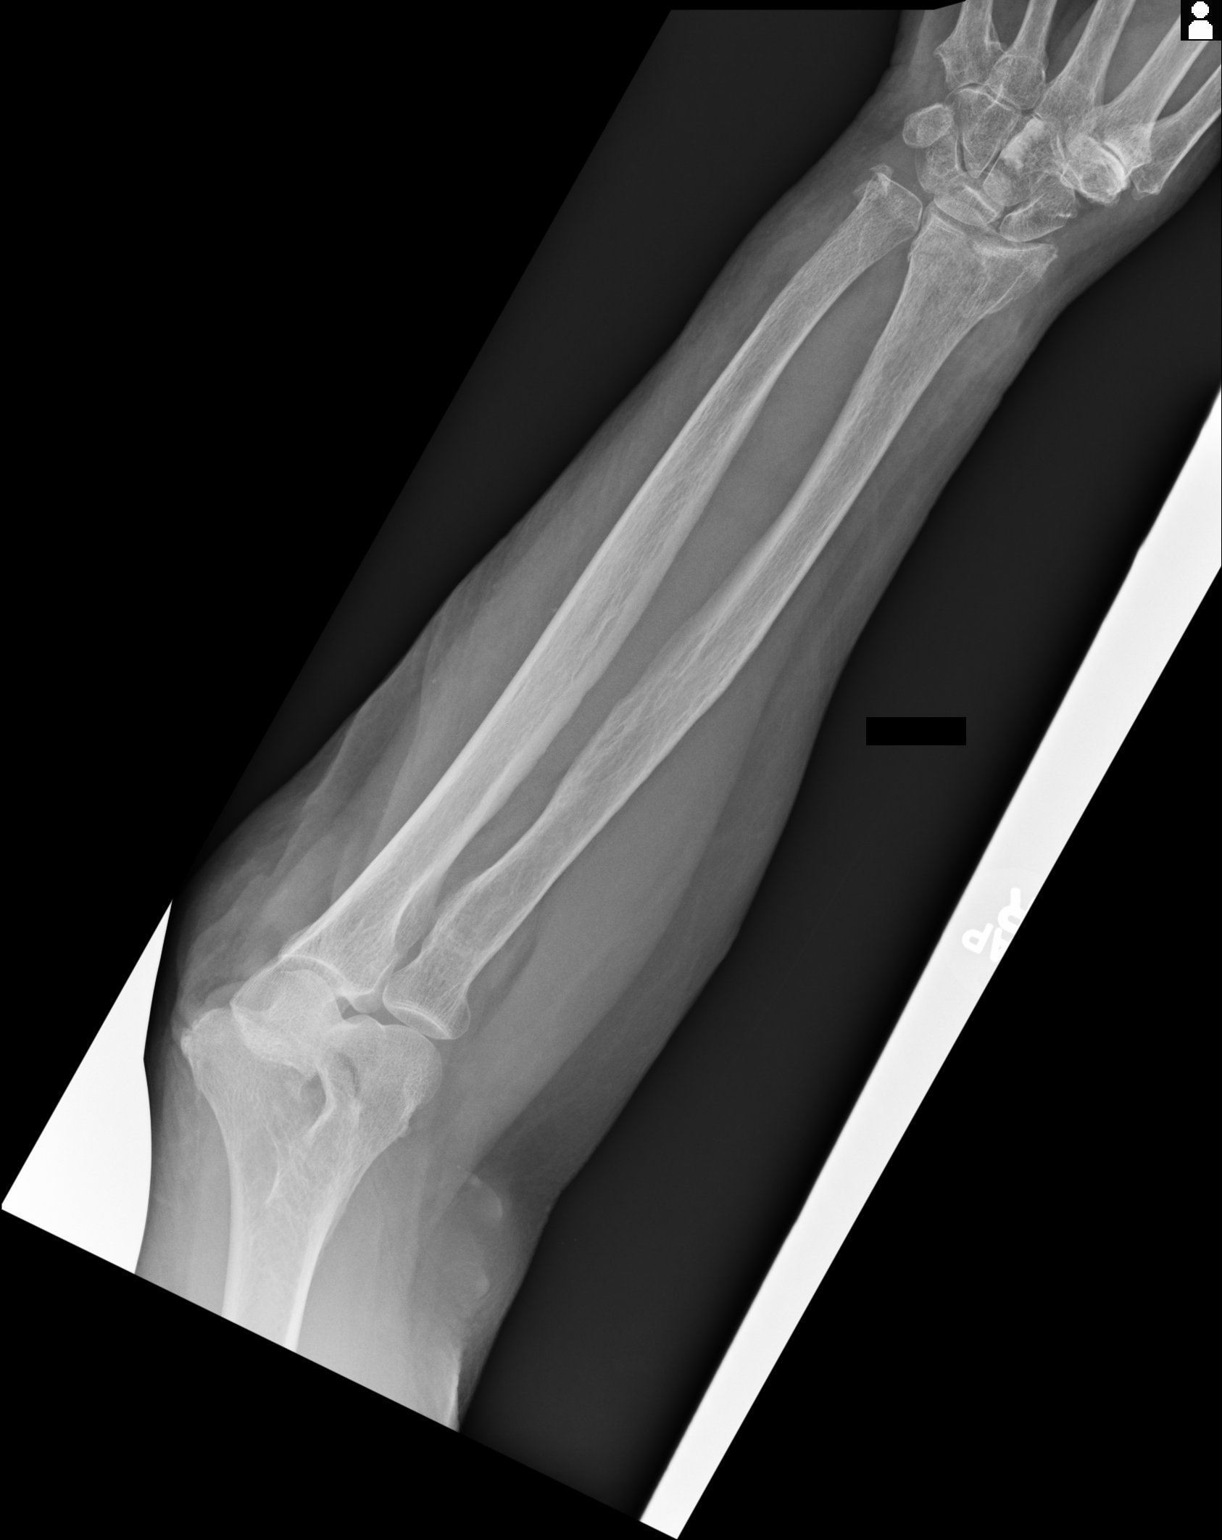

[1 of 1 positions shown; findings below may reference images not displayed]

FINDINGS: Comminuted, mildly impacted fracture of the distal radial metaphysis
and epiphysis with mild dorsal displacement and dorsal angulation of
the distal fragment. This extends into the radiocarpal joint. There
is also a mildly comminuted fracture of the ulnar styloid.
Associated soft tissue swelling. Also noted are right wrist
degenerative changes. There is also an elongated, oval area of
density ventral to the distal carpal row.
IMPRESSION: Fractures of the distal radius and ulnar styloid, as described
above.

## 2016-12-04 IMAGING — CR DG THORACIC SPINE 2V
1 series · 3 of 3 positions shown · non-contrast
Comparison: None.

CLINICAL DATA: Fall. Right wrist and mid back pain. Bruising to the
chin. Right back pain.

EXAM:
THORACIC SPINE 2 VIEWS

[Series 1: dg thoracic spine 2 view · 0.14mm/px · 3 of 3 slices shown]
[im 1/3]
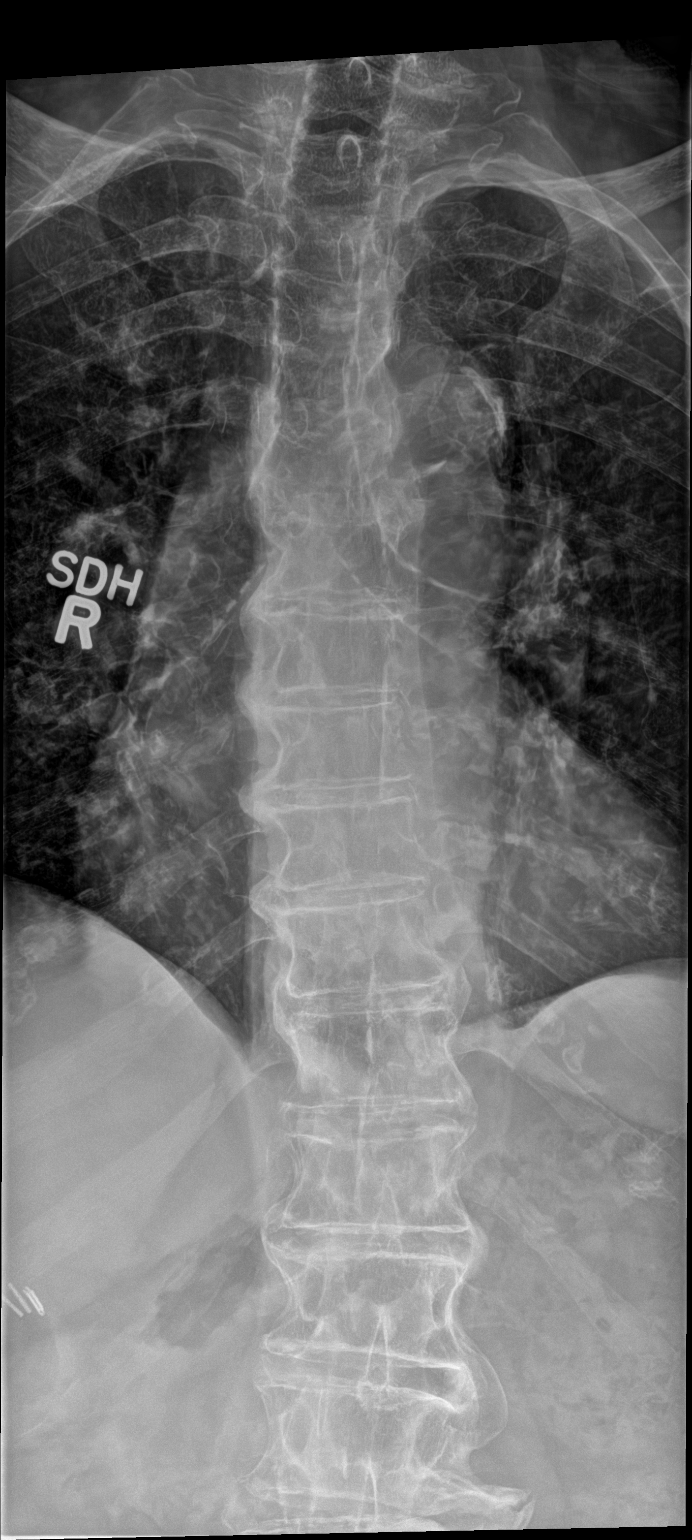
[im 2/3]
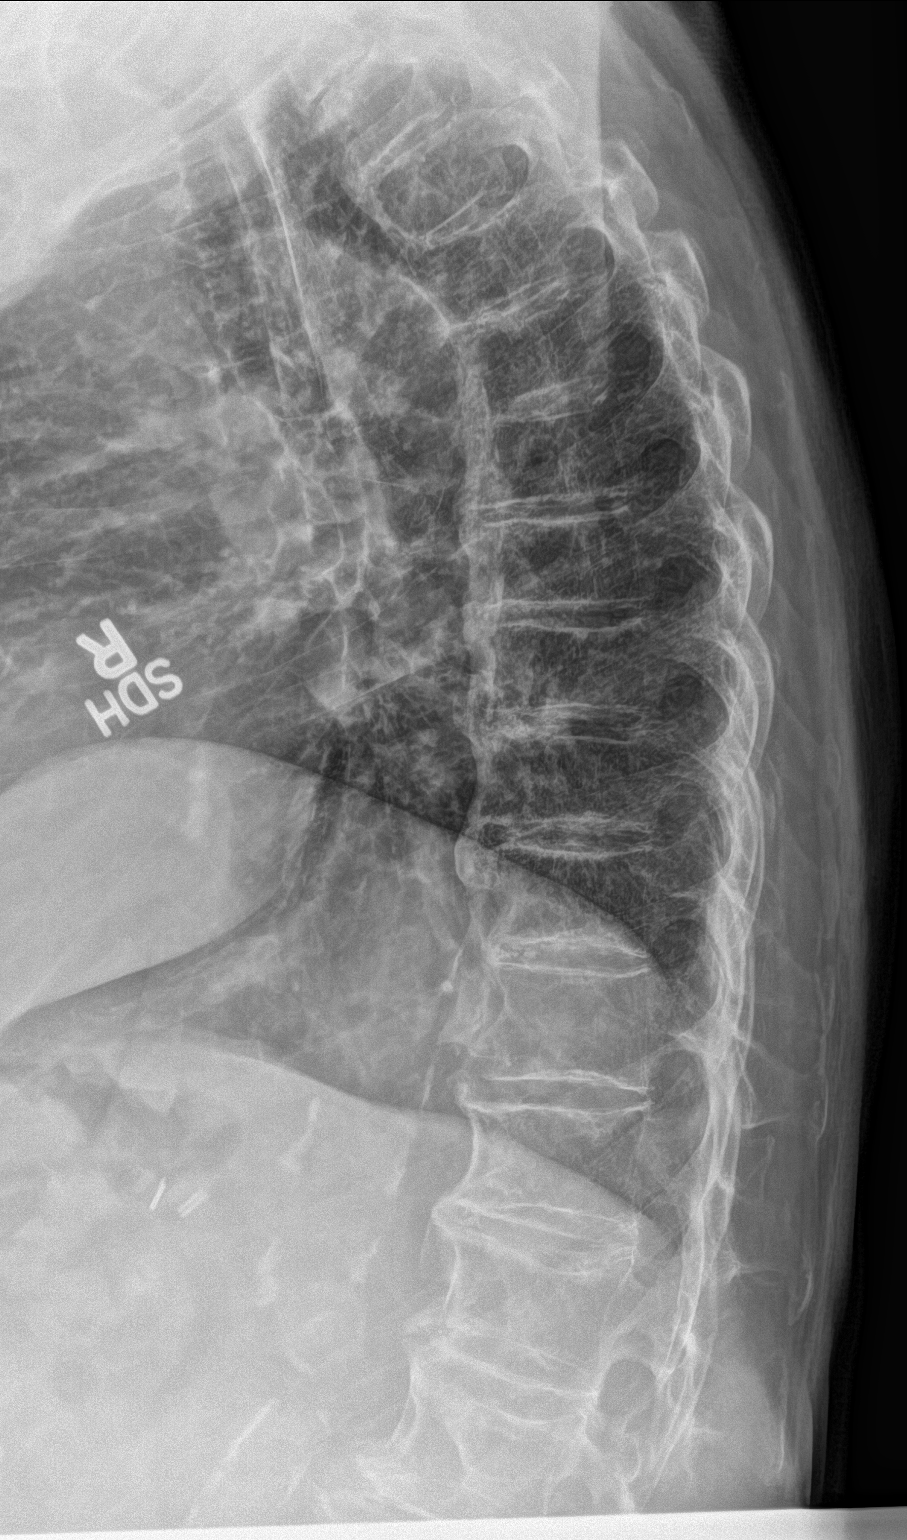
[im 3/3]
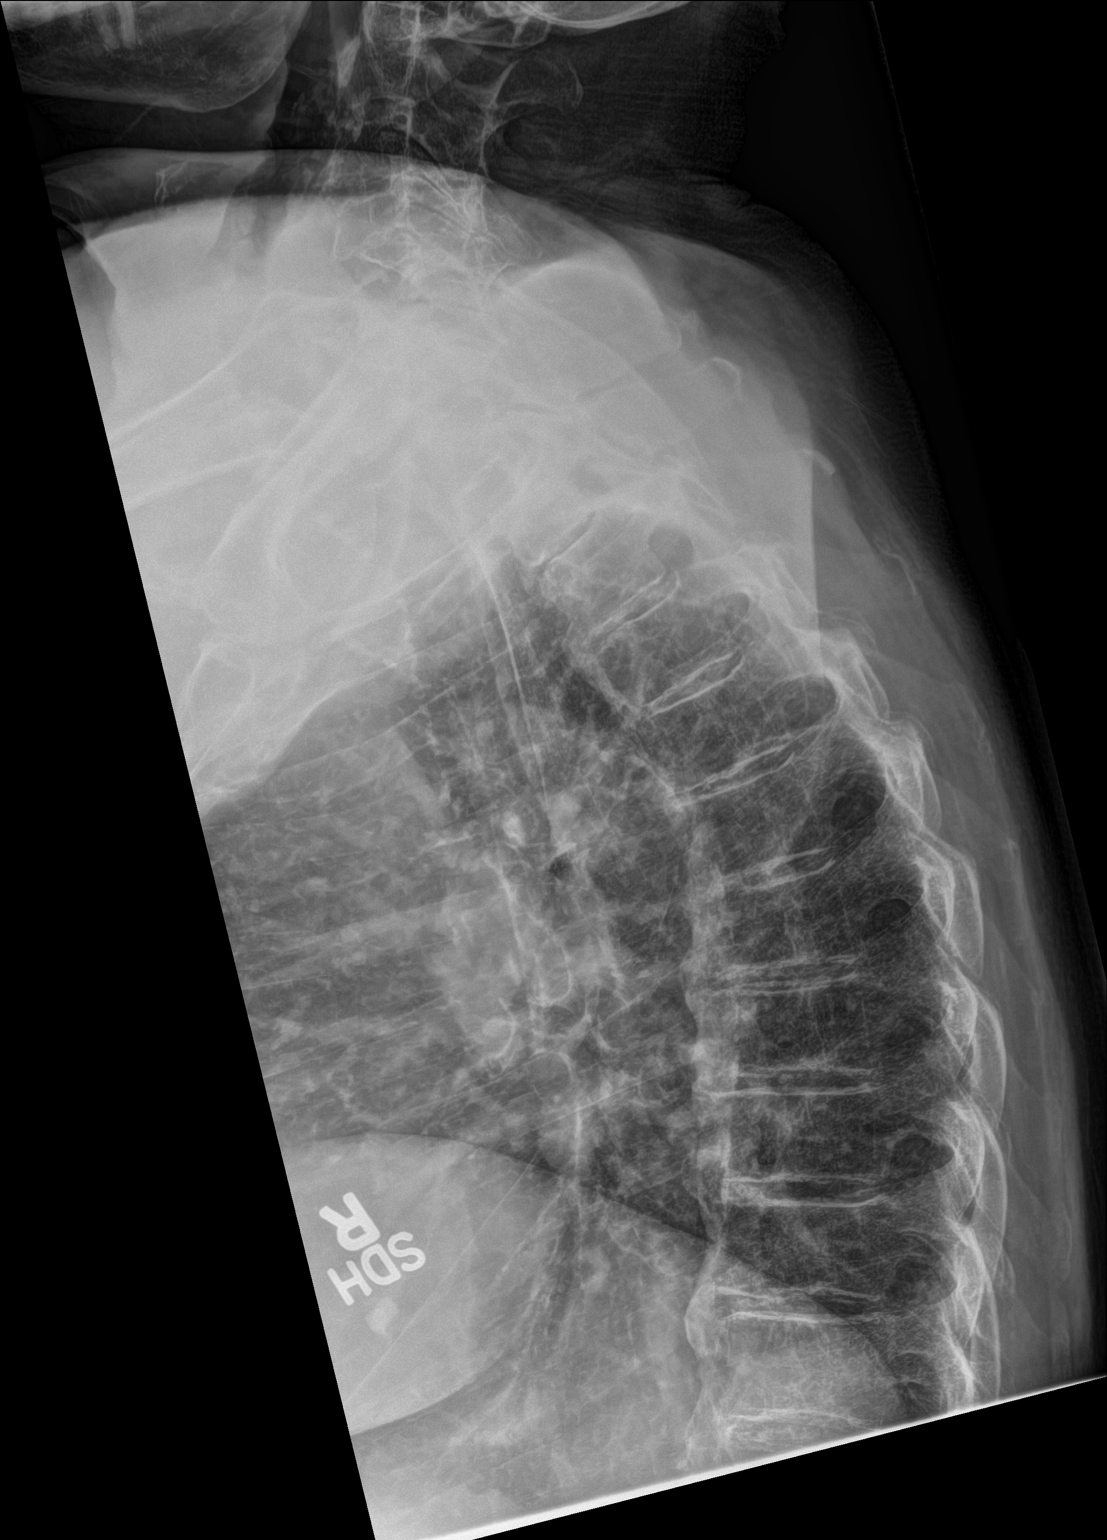

[3 of 3 positions shown; findings below may reference images not displayed]

FINDINGS: Diffuse bony demineralization and thoracic spondylosis. No discrete
compression fracture identified in the thoracic spine.

Thoracic aortic atherosclerotic calcification.  No pleural effusion.
IMPRESSION: 1. Bony demineralization.
2. Extensive thoracic spondylosis. No discrete thoracic compression
fracture or subluxation is identified.
3. Atherosclerosis.

## 2017-01-15 ENCOUNTER — Ambulatory Visit: Payer: Medicare Other | Admitting: Podiatry

## 2017-02-13 LAB — HM DEXA SCAN

## 2017-05-18 DIAGNOSIS — M810 Age-related osteoporosis without current pathological fracture: Secondary | ICD-10-CM | POA: Insufficient documentation

## 2018-07-05 ENCOUNTER — Other Ambulatory Visit: Payer: Self-pay | Admitting: Interventional Cardiology

## 2018-07-05 DIAGNOSIS — I4819 Other persistent atrial fibrillation: Secondary | ICD-10-CM

## 2018-07-21 ENCOUNTER — Other Ambulatory Visit: Payer: Self-pay

## 2018-07-21 ENCOUNTER — Ambulatory Visit
Admission: RE | Admit: 2018-07-21 | Discharge: 2018-07-21 | Disposition: A | Discharge status: Home / Self Care | Payer: Medicare Other | Source: Ambulatory Visit | Attending: Optometry | Admitting: Optometry

## 2018-07-21 DIAGNOSIS — E119 Type 2 diabetes mellitus without complications: Secondary | ICD-10-CM | POA: Diagnosis not present

## 2018-07-21 DIAGNOSIS — I10 Essential (primary) hypertension: Secondary | ICD-10-CM | POA: Diagnosis not present

## 2018-07-21 DIAGNOSIS — R011 Cardiac murmur, unspecified: Secondary | ICD-10-CM | POA: Diagnosis not present

## 2018-07-21 DIAGNOSIS — I358 Other nonrheumatic aortic valve disorders: Secondary | ICD-10-CM | POA: Diagnosis not present

## 2018-07-21 DIAGNOSIS — I2510 Atherosclerotic heart disease of native coronary artery without angina pectoris: Secondary | ICD-10-CM | POA: Insufficient documentation

## 2018-07-21 DIAGNOSIS — I350 Nonrheumatic aortic (valve) stenosis: Secondary | ICD-10-CM | POA: Insufficient documentation

## 2018-07-21 DIAGNOSIS — I4819 Other persistent atrial fibrillation: Secondary | ICD-10-CM | POA: Diagnosis not present

## 2018-07-21 NOTE — Progress Notes (Signed)
*  PRELIMINARY RESULTS* Echocardiogram 2D Echocardiogram has been performed.  [name] 07/21/2018, 11:58 AM

## 2019-01-12 LAB — BASIC METABOLIC PANEL
BUN: 30 — AB (ref 4–21)
Creatinine: 1.1 (ref 0.5–1.1)
Glucose: 114
Potassium: 4.3 (ref 3.4–5.3)
Sodium: 136 — AB (ref 137–147)

## 2019-01-12 LAB — HEMOGLOBIN A1C: Hemoglobin A1C: 6.3

## 2019-02-15 ENCOUNTER — Encounter: Payer: Medicare Other | Admitting: Nurse Practitioner

## 2019-02-15 ENCOUNTER — Other Ambulatory Visit: Payer: Self-pay

## 2019-02-16 NOTE — Progress Notes (Signed)
This encounter was created in error - please disregard.

## 2019-02-17 ENCOUNTER — Encounter: Payer: Self-pay | Admitting: Nurse Practitioner

## 2019-02-17 ENCOUNTER — Ambulatory Visit: Payer: Medicare Other | Admitting: Nurse Practitioner

## 2019-02-17 ENCOUNTER — Other Ambulatory Visit: Payer: Self-pay

## 2019-02-17 VITALS — BP 120/64 | HR 63 | Temp 97.1°F | Ht 69.0 in | Wt 195.0 lb

## 2019-02-17 DIAGNOSIS — M5442 Lumbago with sciatica, left side: Secondary | ICD-10-CM | POA: Diagnosis not present

## 2019-02-17 DIAGNOSIS — R531 Weakness: Secondary | ICD-10-CM | POA: Diagnosis not present

## 2019-02-17 NOTE — Progress Notes (Signed)
Careteam: Patient Care Team: Rusty Aus, MD as PCP - General (Internal Medicine)  Advanced Directive information Does Patient Have a Medical Advance Directive?: Yes(Has DNR, but does not provide a copy), Type of Advance Directive: Out of facility DNR (pink MOST or yellow form)  Allergies  Allergen Reactions  . Alendronate Other (See Comments)    Cramping of the jaw  . Iodinated Diagnostic Agents Other (See Comments)    Brother has allergy and she wants not to receive it   . Other Other (See Comments)    Brother has allergy and she wants not to receive it   . Statins Other (See Comments)    Fatigue/muscle weakness Fatigue/muscle weakness Fatigue/muscle weakness    Chief Complaint  Patient presents with  . Acute Visit    Patient with complaints of left hip pain x3 weeks, not relieved by Advil  . Immunizations    Flu shot - states she will get it at [facility] flu clinic     HPI: Patient is a 85 y.o. female seen at [facility] for acute visit. Pain to left lower back started 3 weeks ago, it is day 19. Never had pain like this before. Pain is 10+ sometimes, took medication before visit and pain 6-7 with 2 Advil. Has been using ibuprofen/advil for 3 weeks No injury.  Starts low back left side and travels around into leg- shooting pain.  Starts as a cramping and then muscle goes into a spasm. Had an electric type sensation when it first start and now that has gotten better but cramping has gotten worse.   Increase weakness, she is sedentary worse due to pandemic.  No loss of control of bowel or bladder.  Uses a walker   Fatigued easily which she contributes to decrease in activity.  Review of Systems:  Review of Systems  Constitutional: Negative for chills and fever.  Gastrointestinal: Negative for constipation and diarrhea.       No loss of control of bowels   Genitourinary: Positive for frequency (unchanged).  Musculoskeletal: Positive for back pain and  myalgias. Negative for falls and joint pain.  Neurological: Positive for weakness. Negative for dizziness, tingling, focal weakness and headaches.    Past Medical History:  Diagnosis Date  . Atrial fibrillation ([place])   . Coronary artery disease   . Diabetes mellitus without complication ([place])   . Hypertension   . Meningioma ([place])    Left frontal lobe   Past Surgical History:  Procedure Laterality Date  . APPENDECTOMY    . CHOLECYSTECTOMY    . MELANOMA REMOVAL Right    Social History:   reports that she has never smoked. She has never used smokeless tobacco. She reports current alcohol use. She reports that she does not use drugs.  History reviewed. No pertinent family history.  Medications: Patient's Medications  New Prescriptions   No medications on file  Previous Medications   ASPIRIN 325 MG EC TABLET    Take 325 mg by mouth daily. Usually takes 1 tab BID   FUROSEMIDE (LASIX) 20 MG TABLET    Take 20 mg by mouth daily. Take an extra pill qd if needed for swelling.   IBUPROFEN (ADVIL PO)    Take by mouth as needed. Unknown dose - states taking 1-2 up to 6 times a day   INSULIN DETEMIR (LEVEMIR FLEXPEN) 100 UNIT/ML PEN    Inject 5-10 Units into the skin 2 (two) times daily. 5 units QAM 10  units QPM   INSULIN SYRINGE-NEEDLE U-100 (INSULIN SYRINGE 1CC/31GX5/16") 31G X 5/16" 1 ML MISC       LOSARTAN-HYDROCHLOROTHIAZIDE (HYZAAR) 100-12.5 MG TABLET    Take 1 tablet by mouth daily.   METFORMIN (GLUCOPHAGE) 1000 MG TABLET    1,000 mg 2 (two) times daily with a meal.    METOPROLOL TARTRATE (LOPRESSOR) 25 MG TABLET    Take 12.5 mg by mouth 2 (two) times daily.   SPIRONOLACTONE (ALDACTONE) 25 MG TABLET    Take 25 mg by mouth daily.  Modified Medications   No medications on file  Discontinued Medications   ASPIRIN EC 81 MG TABLET    Take by mouth.    Physical Exam:   Vitals:   02/17/19 1520  BP: 120/64  Pulse: 63  Temp: (!) 97.1 F (36.2 C)  TempSrc: Oral  Weight: 195 lb  (88.5 kg)  Height: 5\' 9"  (1.753 m)  PF: 97 L/min   Body mass index is 28.8 kg/m. Wt Readings from Last 3 Encounters:  02/17/19 195 lb (88.5 kg)  02/12/15 195 lb (88.5 kg)  02/01/15 193 lb (87.5 kg)    Physical Exam Constitutional:      Appearance: Normal appearance.  Musculoskeletal:     Lumbar back: She exhibits tenderness and spasm.       Back:     Right lower leg: No edema.     Left lower leg: No edema.     Comments: Tenderness noted  Reports spasm   Neurological:     General: No focal deficit present.     Mental Status: She is alert and oriented to person, place, and time.     Sensory: No sensory deficit.     Coordination: Coordination normal.     Gait: Gait abnormal (slow with walker).     Deep Tendon Reflexes: Reflexes normal.   limited exam, pt unable to get up on exam table due to weakness and height of exam table in clinic   Labs reviewed: Basic Metabolic Panel: Recent Labs    01/12/19  NA 136*  K 4.3  BUN 30*  CREATININE 1.1   Liver Function Tests: No results for input(s): AST, ALT, ALKPHOS, BILITOT, PROT, ALBUMIN in the last 8760 hours. No results for input(s): LIPASE, AMYLASE in the last 8760 hours. No results for input(s): AMMONIA in the last 8760 hours. CBC: No results for input(s): WBC, NEUTROABS, HGB, HCT, MCV, PLT in the last 8760 hours. Lipid Panel: No results for input(s): CHOL, HDL, LDLCALC, TRIG, CHOLHDL, LDLDIRECT in the last 8760 hours. TSH: No results for input(s): TSH in the last 8760 hours. A1C: Lab Results  Component Value Date   HGBA1C 6.3 01/12/2019   Imagining:  CLINICAL DATA:  85 year old female with lumbar back pain after a fall last month from standing in a parking lot. Pain radiating to the right. Subsequent encounter.  EXAM: LUMBAR SPINE - 2-3 VIEW  COMPARISON:  Lumbar radiographs 02/01/2015.  FINDINGS: Stable cholecystectomy clips. Extensive calcified atherosclerosis of the abdominal aorta. Normal lumbar  segmentation re- demonstrated. Bulky, flowing lumbar endplate osteophytes maximal at L2-L3 on the left. Chronic severe disc space loss at L2-L3 with vacuum disc and endplate sclerosis. Stable lumbar and visualized lower thoracic vertebral height and alignment. Sacral ala appear grossly intact. Pelvic calcifications probably a combination of phleboliths and uterine fibroid calcification. No acute osseous abnormality identified.  IMPRESSION: 1.  No acute fracture or listhesis identified in the lumbar spine. 2. Widespread calcified atherosclerosis of the aorta.  Assessment/Plan 1. Acute left-sided low back pain with left-sided sciatica -pt is using an excess of ibuprofein/advil over the last 3 weeks. Due to her age will have her stop at this time due to risk of adverse effects on GI, cardiac, and kidneys.  -to use tylenol 500 mg 1-2 tablets every 8 hours as needed pain -to use heating pad 30 mins 3 times daily with muscle rub after to effected areas.  - DG Lumbar Spine Complete; Future -order sent to [facility] therapy department for PT due to weakness and low back pain.  2. Weakness Worsening weakness due to being sedentary from increase pain. Also noted decrease activity due to COVID which has possible contributed to pain. Therapy referral has been placed and SW at [facility] have been involved in pt care.   Carlos American. [place], [facility] [phone]

## 2019-02-17 NOTE — Patient Instructions (Addendum)
To use heating pad to low back 3 times daily for 20-30 mins after heat to apply muscle rub  To stop all NSAIDS (advil, ibuprofen, aleve) at this time To use tylenol 500 mg 1-2 tablets every 8 hours as needed for pain  Order placed for xray at [facility] - to coordinate with [facility] and radiology department about getting an appt   You will also hear from the physical therapy department.

## 2019-03-24 ENCOUNTER — Ambulatory Visit: Payer: Medicare Other | Admitting: Nurse Practitioner

## 2019-03-24 ENCOUNTER — Encounter: Payer: Self-pay | Admitting: Nurse Practitioner

## 2019-03-24 ENCOUNTER — Other Ambulatory Visit: Payer: Self-pay

## 2019-03-24 VITALS — BP 124/70 | HR 90 | Temp 98.8°F

## 2019-03-24 DIAGNOSIS — L03116 Cellulitis of left lower limb: Secondary | ICD-10-CM

## 2019-03-24 DIAGNOSIS — L03115 Cellulitis of right lower limb: Secondary | ICD-10-CM

## 2019-03-24 MED ORDER — DOXYCYCLINE HYCLATE 100 MG PO TABS: 100.0000 mg | ORAL_TABLET | Freq: Two times a day (BID) | ORAL | 0 refills | Status: DC

## 2019-03-24 NOTE — Patient Instructions (Signed)
[address] Located at the [facility]  To walk in without appt. Until 6 tonight    To start doxycycline 100 mg by mouth twice daily  Follow up in 1 week- sooner if symptoms worsen

## 2019-03-24 NOTE — Progress Notes (Signed)
Careteam: Patient Care Team: Rusty Aus, MD as PCP - General (Internal Medicine)  Advanced Directive information    Allergies  Allergen Reactions  . Alendronate Other (See Comments)    Cramping of the jaw  . Iodinated Diagnostic Agents Other (See Comments)    Brother has allergy and she wants not to receive it   . Other Other (See Comments)    Brother has allergy and she wants not to receive it   . Statins Other (See Comments)    Fatigue/muscle weakness Fatigue/muscle weakness Fatigue/muscle weakness    Chief Complaint  Patient presents with  . Acute Visit    Foot injury from mid October 2020      HPI: Patient is a 85 y.o. female seen in today at the [facility] for foot injury.  Pt with hx of diabetes, a fib, osteoporosis,   Went to Dr Tyrone Schimke and had xrays done of her back there but has not heard back but pain got better so she stopped calling.  A few weeks later her heavy walker (weighting about 15-20 lbs) fell and hit her foot. This was about a month ago. Immediately caused swelling, pain and blisters and redness. This has not healed or improved. At one point redness was going up her leg but this has improved. Her aide recommended compression socks and this helped the swelling. Pain and bruising has persisted.  Now red and exteremly painful. Ongoing blisters to tops of both feet.  No fevers but reports she feels "feverish for her"  Review of Systems:  Review of Systems  Constitutional: Positive for malaise/fatigue. Negative for chills and fever.  Musculoskeletal: Positive for falls, joint pain and myalgias.    Past Medical History:  Diagnosis Date  . Atrial fibrillation ([place])   . Coronary artery disease   . Diabetes mellitus without complication ([place])   . Hypertension   . Meningioma ([place])    Left frontal lobe   Past Surgical History:  Procedure Laterality Date  . APPENDECTOMY    . CHOLECYSTECTOMY    . MELANOMA REMOVAL Right    Social History:    reports that she has never smoked. She has never used smokeless tobacco. She reports current alcohol use. She reports that she does not use drugs.  History reviewed. No pertinent family history.  Medications: Patient's Medications  New Prescriptions   No medications on file  Previous Medications   ASPIRIN 325 MG EC TABLET    Take 325 mg by mouth 2 (two) times daily.    FUROSEMIDE (LASIX) 20 MG TABLET    Take 20 mg by mouth daily. Take an extra pill qd if needed for swelling.   INSULIN DETEMIR (LEVEMIR FLEXPEN) 100 UNIT/ML PEN    Inject 5-10 Units into the skin 2 (two) times daily. 5 units QAM 10 units QPM   INSULIN SYRINGE-NEEDLE U-100 (INSULIN SYRINGE 1CC/31GX5/16") 31G X 5/16" 1 ML MISC       LOSARTAN-HYDROCHLOROTHIAZIDE (HYZAAR) 100-12.5 MG TABLET    Take 1 tablet by mouth daily.   METFORMIN (GLUCOPHAGE) 1000 MG TABLET    1,000 mg 2 (two) times daily with a meal.    METOPROLOL TARTRATE (LOPRESSOR) 25 MG TABLET    Take 12.5 mg by mouth 2 (two) times daily.   SPIRONOLACTONE (ALDACTONE) 25 MG TABLET    Take 25 mg by mouth daily.  Modified Medications   No medications on file  Discontinued Medications   No medications on file  Physical Exam:  Vitals:   03/24/19 1516  BP: 124/70  Pulse: 90  Temp: 98.8 F (37.1 C)  TempSrc: Oral  SpO2: 98%   There is no height or weight on file to calculate BMI. Wt Readings from Last 3 Encounters:  02/17/19 195 lb (88.5 kg)  02/12/15 195 lb (88.5 kg)  02/01/15 193 lb (87.5 kg)    Physical Exam Constitutional:      Appearance: Normal appearance.  HENT:     Head: Normocephalic and atraumatic.  Cardiovascular:     Rate and Rhythm: Normal rate and regular rhythm.     Comments: Unable to get pulses in feet due to pain and pt not tolerating Pulmonary:     Effort: Pulmonary effort is normal.     Breath sounds: Normal breath sounds.  Skin:    General: Skin is warm.     Comments: Redness and significant swelling noted to complete right  foot with extreme right foot tenderness on palpitation. Intact blisters noted to dorsal foot bilaterally Left foot with mild erythema surround blisters and minimal swelling. Tender to touch bilaterally. No redness or edema noted to legs bilaterally  Neurological:     Mental Status: She is alert.  Psychiatric:        Mood and Affect: Mood is anxious. Affect is blunt.        Speech: Speech normal.        Behavior: Behavior is agitated.        Thought Content: Thought content normal.     Labs reviewed: Basic Metabolic Panel: Recent Labs    01/12/19  NA 136*  K 4.3  BUN 30*  CREATININE 1.1   Liver Function Tests: No results for input(s): AST, ALT, ALKPHOS, BILITOT, PROT, ALBUMIN in the last 8760 hours. No results for input(s): LIPASE, AMYLASE in the last 8760 hours. No results for input(s): AMMONIA in the last 8760 hours. CBC: No results for input(s): WBC, NEUTROABS, HGB, HCT, MCV, PLT in the last 8760 hours. Lipid Panel: No results for input(s): CHOL, HDL, LDLCALC, TRIG, CHOLHDL, LDLDIRECT in the last 8760 hours. TSH: No results for input(s): TSH in the last 8760 hours. A1C: Lab Results  Component Value Date   HGBA1C 6.3 01/12/2019     Assessment/Plan 1. Cellulitis of right lower extremity -to right foot, educated pt due to her diabetes hx she is at high risk for complications (gangrene and amputation) and it is imperative for her to keep follow up appts, have xray and take antibiotic as prescribed. She reports she does not wish to [facility] but then reports she will consider having transportation take her. Information provided on [facility] with address and number provided. [place] [facility] nurse made aware of situation. Pt reports she plans to call her endocrinologist and wont be following up with [place] in clinic or her PCP.  - DG Foot Complete Right; Future to rule out osteomyelitis, fracture - doxycycline (VIBRA-TABS) 100 MG tablet; Take 1  tablet (100 mg total) by mouth 2 (two) times daily.  Dispense: 20 tablet; Refill: 0 - DG Foot Complete Left; Future  2. Cellulitis of left lower extremity -left foot with slight redness, swelling and pain. Will get xray to rule out fracture and infection due to diabetes. - DG Foot Complete Right; Future - doxycycline (VIBRA-TABS) 100 MG tablet; Take 1 tablet (100 mg total) by mouth 2 (two) times daily.  Dispense: 20 tablet; Refill: 0 - DG Foot Complete Left;  Future  Next appt- encouraged 1 week follow up in clinic but she declined, states she will follow up with endocrinologist.  Carlos American. Harle Battiest  [facility] [phone]   Total time 60 mins:  time greater than 50% of total time spent doing pt counseling and coordination of care

## 2019-03-25 ENCOUNTER — Ambulatory Visit
Admission: RE | Admit: 2019-03-25 | Discharge: 2019-03-25 | Disposition: A | Discharge status: Home / Self Care | Payer: Medicare Other | Source: Ambulatory Visit | Attending: Nurse Practitioner | Admitting: Nurse Practitioner

## 2019-03-25 ENCOUNTER — Ambulatory Visit
Admission: RE | Admit: 2019-03-25 | Discharge: 2019-03-25 | Disposition: A | Discharge status: Home / Self Care | Payer: Medicare Other | Attending: Nurse Practitioner | Admitting: Nurse Practitioner

## 2019-03-25 DIAGNOSIS — L03116 Cellulitis of left lower limb: Secondary | ICD-10-CM

## 2019-03-25 DIAGNOSIS — M5442 Lumbago with sciatica, left side: Secondary | ICD-10-CM

## 2019-03-25 DIAGNOSIS — L03115 Cellulitis of right lower limb: Secondary | ICD-10-CM | POA: Insufficient documentation

## 2019-03-29 ENCOUNTER — Inpatient Hospital Stay
Admission: EM | Admit: 2019-03-29 | Discharge: 2019-04-08 | DRG: 300 | Disposition: A | Discharge status: Skilled Nursing Facility | Payer: Medicare Other | Attending: Family Medicine | Admitting: Family Medicine

## 2019-03-29 ENCOUNTER — Other Ambulatory Visit: Payer: Self-pay

## 2019-03-29 ENCOUNTER — Encounter: Payer: Self-pay | Admitting: Emergency Medicine

## 2019-03-29 ENCOUNTER — Telehealth: Payer: Self-pay | Admitting: Nurse Practitioner

## 2019-03-29 DIAGNOSIS — E875 Hyperkalemia: Secondary | ICD-10-CM | POA: Diagnosis not present

## 2019-03-29 DIAGNOSIS — L03116 Cellulitis of left lower limb: Secondary | ICD-10-CM | POA: Diagnosis not present

## 2019-03-29 DIAGNOSIS — Z888 Allergy status to other drugs, medicaments and biological substances status: Secondary | ICD-10-CM | POA: Diagnosis not present

## 2019-03-29 DIAGNOSIS — E1151 Type 2 diabetes mellitus with diabetic peripheral angiopathy without gangrene: Secondary | ICD-10-CM | POA: Diagnosis present

## 2019-03-29 DIAGNOSIS — S92911D Unspecified fracture of right toe(s), subsequent encounter for fracture with routine healing: Secondary | ICD-10-CM

## 2019-03-29 DIAGNOSIS — Z789 Other specified health status: Secondary | ICD-10-CM | POA: Diagnosis not present

## 2019-03-29 DIAGNOSIS — I70202 Unspecified atherosclerosis of native arteries of extremities, left leg: Secondary | ICD-10-CM | POA: Diagnosis present

## 2019-03-29 DIAGNOSIS — I70234 Atherosclerosis of native arteries of right leg with ulceration of heel and midfoot: Secondary | ICD-10-CM | POA: Diagnosis not present

## 2019-03-29 DIAGNOSIS — M810 Age-related osteoporosis without current pathological fracture: Secondary | ICD-10-CM | POA: Diagnosis present

## 2019-03-29 DIAGNOSIS — E118 Type 2 diabetes mellitus with unspecified complications: Secondary | ICD-10-CM

## 2019-03-29 DIAGNOSIS — Z85820 Personal history of malignant melanoma of skin: Secondary | ICD-10-CM

## 2019-03-29 DIAGNOSIS — Z20828 Contact with and (suspected) exposure to other viral communicable diseases: Secondary | ICD-10-CM | POA: Diagnosis present

## 2019-03-29 DIAGNOSIS — Z7982 Long term (current) use of aspirin: Secondary | ICD-10-CM

## 2019-03-29 DIAGNOSIS — L03115 Cellulitis of right lower limb: Secondary | ICD-10-CM | POA: Diagnosis present

## 2019-03-29 DIAGNOSIS — I5032 Chronic diastolic (congestive) heart failure: Secondary | ICD-10-CM | POA: Diagnosis present

## 2019-03-29 DIAGNOSIS — I739 Peripheral vascular disease, unspecified: Secondary | ICD-10-CM | POA: Diagnosis present

## 2019-03-29 DIAGNOSIS — I70244 Atherosclerosis of native arteries of left leg with ulceration of heel and midfoot: Secondary | ICD-10-CM | POA: Diagnosis not present

## 2019-03-29 DIAGNOSIS — I4819 Other persistent atrial fibrillation: Secondary | ICD-10-CM | POA: Diagnosis present

## 2019-03-29 DIAGNOSIS — W208XXD Other cause of strike by thrown, projected or falling object, subsequent encounter: Secondary | ICD-10-CM | POA: Diagnosis present

## 2019-03-29 DIAGNOSIS — Z79899 Other long term (current) drug therapy: Secondary | ICD-10-CM | POA: Diagnosis not present

## 2019-03-29 DIAGNOSIS — R52 Pain, unspecified: Secondary | ICD-10-CM | POA: Diagnosis present

## 2019-03-29 DIAGNOSIS — R079 Chest pain, unspecified: Secondary | ICD-10-CM | POA: Diagnosis not present

## 2019-03-29 DIAGNOSIS — I4891 Unspecified atrial fibrillation: Secondary | ICD-10-CM | POA: Diagnosis not present

## 2019-03-29 DIAGNOSIS — Z794 Long term (current) use of insulin: Secondary | ICD-10-CM | POA: Diagnosis not present

## 2019-03-29 DIAGNOSIS — I110 Hypertensive heart disease with heart failure: Secondary | ICD-10-CM | POA: Diagnosis present

## 2019-03-29 DIAGNOSIS — I10 Essential (primary) hypertension: Secondary | ICD-10-CM | POA: Diagnosis not present

## 2019-03-29 DIAGNOSIS — I2510 Atherosclerotic heart disease of native coronary artery without angina pectoris: Secondary | ICD-10-CM | POA: Diagnosis present

## 2019-03-29 DIAGNOSIS — Z91041 Radiographic dye allergy status: Secondary | ICD-10-CM

## 2019-03-29 DIAGNOSIS — N179 Acute kidney failure, unspecified: Secondary | ICD-10-CM | POA: Diagnosis present

## 2019-03-29 DIAGNOSIS — D649 Anemia, unspecified: Secondary | ICD-10-CM | POA: Diagnosis present

## 2019-03-29 LAB — BASIC METABOLIC PANEL
Anion gap: 17 — ABNORMAL HIGH (ref 5–15)
BUN: 85 mg/dL — ABNORMAL HIGH (ref 8–23)
CO2: 21 mmol/L — ABNORMAL LOW (ref 22–32)
Calcium: 9.9 mg/dL (ref 8.9–10.3)
Chloride: 99 mmol/L (ref 98–111)
Creatinine, Ser: 1.97 mg/dL — ABNORMAL HIGH (ref 0.44–1.00)
GFR calc Af Amer: 26 mL/min — ABNORMAL LOW (ref >60)
GFR calc non Af Amer: 22 mL/min — ABNORMAL LOW (ref >60)
Glucose, Bld: 135 mg/dL — ABNORMAL HIGH (ref 70–99)
Potassium: 4.2 mmol/L (ref 3.5–5.1)
Sodium: 137 mmol/L (ref 135–145)

## 2019-03-29 LAB — CBC
HCT: 37.3 % (ref 36.0–46.0)
Hemoglobin: 12.3 g/dL (ref 12.0–15.0)
MCH: 32.6 pg (ref 26.0–34.0)
MCHC: 33 g/dL (ref 30.0–36.0)
MCV: 98.9 fL (ref 80.0–100.0)
Platelets: 350 10*3/uL (ref 150–400)
RBC: 3.77 MIL/uL — ABNORMAL LOW (ref 3.87–5.11)
RDW: 14.2 % (ref 11.5–15.5)
WBC: 10.4 10*3/uL (ref 4.0–10.5)
nRBC: 0 % (ref 0.0–0.2)

## 2019-03-29 LAB — LACTIC ACID, PLASMA: Lactic Acid, Venous: 3.2 mmol/L (ref 0.5–1.9)

## 2019-03-29 MED ORDER — SODIUM CHLORIDE 0.9 % IV SOLN
INTRAVENOUS | Status: AC
  Administered 2019-03-29 – 2019-03-30 (×2): via INTRAVENOUS

## 2019-03-29 MED ORDER — SODIUM CHLORIDE 0.9 % IV BOLUS
1000.0000 mL | Freq: Once | INTRAVENOUS | Status: AC
  Administered 2019-03-29: 19:00:00 1000 mL via INTRAVENOUS

## 2019-03-29 MED ORDER — VANCOMYCIN HCL IN DEXTROSE 1-5 GM/200ML-% IV SOLN
1000.0000 mg | Freq: Once | INTRAVENOUS | Status: DC
  Filled 2019-03-29: qty 200

## 2019-03-29 MED ORDER — INSULIN ASPART 100 UNIT/ML ~~LOC~~ SOLN
0.0000 [IU] | Freq: Three times a day (TID) | SUBCUTANEOUS | Status: DC
  Filled 2019-03-29 (×2): qty 1

## 2019-03-29 MED ORDER — ACETAMINOPHEN 325 MG PO TABS
650.0000 mg | ORAL_TABLET | Freq: Four times a day (QID) | ORAL | Status: DC | PRN
  Filled 2019-03-29: qty 2

## 2019-03-29 MED ORDER — INSULIN DETEMIR 100 UNIT/ML ~~LOC~~ SOLN
5.0000 [IU] | Freq: Every morning | SUBCUTANEOUS | Status: DC
  Administered 2019-03-30 – 2019-04-08 (×10): 5 [IU] via SUBCUTANEOUS
  Filled 2019-03-29 (×12): qty 0.05

## 2019-03-29 MED ORDER — HEPARIN SODIUM (PORCINE) 5000 UNIT/ML IJ SOLN
5000.0000 [IU] | Freq: Three times a day (TID) | INTRAMUSCULAR | Status: DC
  Administered 2019-03-29 – 2019-04-08 (×29): 5000 [IU] via SUBCUTANEOUS
  Filled 2019-03-29 (×29): qty 1

## 2019-03-29 MED ORDER — SODIUM CHLORIDE 0.9 % IV SOLN
2.0000 g | Freq: Once | INTRAVENOUS | Status: AC
  Administered 2019-03-29: 2 g via INTRAVENOUS
  Filled 2019-03-29: qty 2

## 2019-03-29 MED ORDER — VANCOMYCIN HCL 10 G IV SOLR
1750.0000 mg | Freq: Once | INTRAVENOUS | Status: AC
  Administered 2019-03-29: 1750 mg via INTRAVENOUS
  Filled 2019-03-29: qty 1750

## 2019-03-29 MED ORDER — ACETAMINOPHEN 650 MG RE SUPP: 650.0000 mg | Freq: Four times a day (QID) | RECTAL | Status: DC | PRN

## 2019-03-29 MED ORDER — ASPIRIN EC 325 MG PO TBEC
325.0000 mg | DELAYED_RELEASE_TABLET | Freq: Two times a day (BID) | ORAL | Status: DC
  Administered 2019-03-29 – 2019-04-08 (×20): 325 mg via ORAL
  Filled 2019-03-29 (×20): qty 1

## 2019-03-29 MED ORDER — ONDANSETRON HCL 4 MG PO TABS: 4.0000 mg | ORAL_TABLET | Freq: Four times a day (QID) | ORAL | Status: DC | PRN

## 2019-03-29 MED ORDER — ONDANSETRON HCL 4 MG/2ML IJ SOLN: 4.0000 mg | Freq: Four times a day (QID) | INTRAMUSCULAR | Status: DC | PRN

## 2019-03-29 MED ORDER — METOPROLOL TARTRATE 25 MG PO TABS
12.5000 mg | ORAL_TABLET | Freq: Two times a day (BID) | ORAL | Status: DC
  Administered 2019-03-29 – 2019-04-08 (×19): 12.5 mg via ORAL
  Filled 2019-03-29 (×20): qty 1

## 2019-03-29 NOTE — ED Triage Notes (Signed)
Pt in via [facility] from home, reports being sent here from PCP due to elevated kidney function.  Pt eating snack in triage.  Denies any new complaints.    Cellulitis noted to bilateral feet, reports currently on antibiotic therapy for such.    Vitals WDL.

## 2019-03-29 NOTE — ED Notes (Signed)
Dr Joni Fears made aware at this time of pt's elevated Lactic Acid level as reported by lab. Lactic Acid 3.2 mmol/L.

## 2019-03-29 NOTE — Consult Note (Signed)
PHARMACY -  BRIEF ANTIBIOTIC NOTE   Pharmacy has received consult(s) for Cefepime/Vancomycin from an ED provider.  The patient's profile has been reviewed for ht/wt/allergies/indication/available labs.    One time order(s) placed for Cefepime 2g x1 and Vancomycin 1g x1.   Further antibiotics/pharmacy consults should be ordered by admitting physician if indicated.                       Thank you, Rowland Lathe 03/29/2019  7:03 PM

## 2019-03-29 NOTE — H&P (Addendum)
History and Physical    [name] [id number] DOB: November 28, 1930 DOA: 03/29/2019  PCP: Rusty Aus, MD  Patient coming from: Home.  Chief Complaint: Lower extremity swelling and worsening renal function.  HPI: [name] is a 85 y.o. female with history of diabetes mellitus type 2, chronic diastolic dysfunction, hypertension had an injury to have both feet when and heavy object fell onto it about 10 days ago.  The feet started swelling become erythematous with wound and had followed up with her primary care physician who prescribed antibiotics.  Despite taking which patient's wound has been getting worse.  Labs were done which showed worsening renal function and patient was instructed to come to the ER.  Patient states about 2 to 3 weeks ago patient did have some diarrhea but nothing further from that.  Denies any vomiting or abdominal pain chest pain or shortness of breath.  X-rays done of the feet on November 13 showed swelling of the right foot with second toe proximal phalanx fracture.  ED Course: In the ER labs reveal creatinine 1.9 with a lactate of 3.2 creatinine is increased from baseline of around 1.1 in September 2020.  Other labs show WBC of 10.4 globin 12.3 platelets 350.  COVID-19 test is pending blood cultures were obtained started on empiric antibiotics fluids and admitted for cellulitis of the both lower extremity with acute renal failure.  Review of Systems: As per HPI, rest all negative.   Past Medical History:  Diagnosis Date  . Atrial fibrillation ([place])   . Coronary artery disease   . Diabetes mellitus without complication ([place])   . Hypertension   . Meningioma ([place])    Left frontal lobe    Past Surgical History:  Procedure Laterality Date  . APPENDECTOMY    . CHOLECYSTECTOMY    . MELANOMA REMOVAL Right      reports that she has never smoked. She has never used smokeless tobacco. She reports current alcohol use of about 1.0 standard drinks of alcohol per  week. She reports that she does not use drugs.  Allergies  Allergen Reactions  . Alendronate Other (See Comments)    Cramping of the jaw  . Iodinated Diagnostic Agents Other (See Comments)    Brother has allergy and she wants not to receive it   . Statins Other (See Comments)    Fatigue/muscle weakness Fatigue/muscle weakness Fatigue/muscle weakness    Family History  Family history unknown: Yes    Prior to Admission medications   Medication Sig Start Date End Date Taking? Authorizing Provider  aspirin 325 MG EC tablet Take 325 mg by mouth 2 (two) times daily.    Yes [provider]  doxycycline (VIBRA-TABS) 100 MG tablet Take 1 tablet (100 mg total) by mouth 2 (two) times daily. 03/24/19  Yes Lauree Chandler, NP  furosemide (LASIX) 20 MG tablet Take 20 mg by mouth daily. Take an extra pill qd if needed for swelling.   Yes [provider]  ibuprofen (ADVIL) 200 MG tablet Take 200 mg by mouth every 6 (six) hours as needed.   Yes [provider]  Insulin Detemir (LEVEMIR FLEXPEN) 100 UNIT/ML Pen Inject 5-10 Units into the skin 2 (two) times daily. 5 units QAM 10 units QPM   Yes [provider]  losartan-hydrochlorothiazide (HYZAAR) 100-12.5 MG tablet Take 1 tablet by mouth daily.   Yes [provider]  metFORMIN (GLUCOPHAGE) 1000 MG tablet 1,000 mg 2 (two) times daily with a meal.  04/13/15  Yes [provider]  metoprolol tartrate (LOPRESSOR) 25 MG tablet Take 12.5 mg by mouth 2 (two) times daily.   Yes [provider]  spironolactone (ALDACTONE) 25 MG tablet Take 25 mg by mouth daily.   Yes [provider]    Physical Exam: Constitutional: Moderately built and nourished. Vitals:   03/29/19 1729 03/29/19 1732  BP: 115/69   Pulse: 74   Resp: 18   Temp: 98.5 F (36.9 C)   TempSrc: Oral   SpO2: 100%   Weight:  87.5 kg  Height:  5\' 7"  (1.702 m)   Eyes: Anicteric no pallor. ENMT: No discharge from the  ears eyes nose or mouth. Neck: No mass felt.  No neck rigidity. Respiratory: No rhonchi or crepitations. Cardiovascular: S1-S2 heard. Abdomen: Soft nontender bowel sounds present. Musculoskeletal: Both lower extremity foot are swollen erythematous with wounds.  No active discharge seen.  Warm to touch. Skin: Bilateral lower extremity wounds and edema of the feet warm to touch. Neurologic: Alert awake oriented to time place and person.  Moves all extremities. Psychiatric: Appears normal per normal affect.   Labs on Admission: I have personally reviewed following labs and imaging studies  CBC: Recent Labs  Lab 03/29/19 1758  WBC 10.4  HGB 12.3  HCT 37.3  MCV 98.9  PLT [id number]   Basic Metabolic Panel: Recent Labs  Lab 03/29/19 1758  NA 137  K 4.2  CL 99  CO2 21*  GLUCOSE 135*  BUN 85*  CREATININE 1.97*  CALCIUM 9.9   GFR: Estimated Creatinine Clearance: 23.3 mL/min (A) (by C-G formula based on SCr of 1.97 mg/dL (H)). Liver Function Tests: No results for input(s): AST, ALT, ALKPHOS, BILITOT, PROT, ALBUMIN in the last 168 hours. No results for input(s): LIPASE, AMYLASE in the last 168 hours. No results for input(s): AMMONIA in the last 168 hours. Coagulation Profile: No results for input(s): INR, PROTIME in the last 168 hours. Cardiac Enzymes: No results for input(s): CKTOTAL, CKMB, CKMBINDEX, TROPONINI in the last 168 hours. BNP (last 3 results) No results for input(s): PROBNP in the last 8760 hours. HbA1C: No results for input(s): HGBA1C in the last 72 hours. CBG: No results for input(s): GLUCAP in the last 168 hours. Lipid Profile: No results for input(s): CHOL, HDL, LDLCALC, TRIG, CHOLHDL, LDLDIRECT in the last 72 hours. Thyroid Function Tests: No results for input(s): TSH, T4TOTAL, FREET4, T3FREE, THYROIDAB in the last 72 hours. Anemia Panel: No results for input(s): VITAMINB12, FOLATE, FERRITIN, TIBC, IRON, RETICCTPCT in the last 72 hours. Urine analysis: No  results found for: COLORURINE, APPEARANCEUR, LABSPEC, PHURINE, GLUCOSEU, HGBUR, BILIRUBINUR, KETONESUR, PROTEINUR, UROBILINOGEN, NITRITE, LEUKOCYTESUR Sepsis Labs: @LABRCNTIP (procalcitonin:4,lacticidven:4) )No results found for this or any previous visit (from the past 240 hour(s)).   Radiological Exams on Admission: No results found.   Assessment/Plan Principal Problem:   Cellulitis of both lower extremities Active Problems:   Essential (primary) hypertension   Persistent atrial fibrillation (HCC)   Diabetes mellitus type 2, controlled, with complications ([place])   Cellulitis   ARF (acute renal failure) ([place])    1. Bilateral lower extremity cellulitis failed outpatient antibiotics we will keep patient on IV antibiotics for now follow cultures if symptoms do not get better will need MRI.  Check ABI.  Note that patient's x-rays did show second toe proximal phalanx fracture. 2. Acute renal failure could be from prerenal cause check UA FENa hold diuretics ARB gently hydrate.  Follow intake output. 3. Hypertension holding ARB due to  renal failure.  Continue beta-blockers.  As needed IV hydralazine. 4. History of A. fib mention in the chart.  Presently only on aspirin and beta-blocker. 5. Diabetes mellitus type 2 on Levemir.  Patient states her blood sugars have been running low recently.  Given that patient has renal failure will decrease her insulin to usually take Levemir twice daily we will keep only on once daily dose for now.  Sliding scale coverage.  Hemoglobin A1c was 6.32 months ago.  Given that patient has failed outpatient oral antibiotics for cellulitis is worsening cellulitis with renal function will need more than 2 midnight stay in inpatient status.   COVID-19 test is pending.  DVT prophylaxis: Heparin. Code Status: Full code. Family Communication: Discussed with patient. Disposition Plan: Home. Consults called: Wound team. Admission status: Inpatient.   Rise Patience MD [facility] Pager [phone].  If 7PM-7AM, please contact night-coverage [email or web address] Password [facility]  03/29/2019, 9:42 PM

## 2019-03-29 NOTE — ED Provider Notes (Signed)
[facility] Emergency Department Provider Note  ____________________________________________  Time seen: Approximately 7:32 PM  I have reviewed the triage vital signs and the nursing notes.   HISTORY  Chief Complaint Abnormal Lab    HPI [name] is a 85 y.o. female with a history of atrial fibrillation, CAD diabetes hypertension who is sent to the ED from her PCP today for hospitalization due to outpatient treatment failure of bilateral foot cellulitis.  The patient was in her usual state of health when a metal walker fell across her feet about a week ago.  She had constant pain after, worse with weightbearing and movement, nonradiating.  Her feet then progressively got more red and swollen.  She saw her doctor who did x-rays which diagnosed a right second proximal phalanx fracture.  They gave her ceftriaxone and started doxycycline for cellulitis.  However, on follow-up today they found that she is clinically not improved and her labs now show acute renal insufficiency.   I reviewed Dr. Blane Ohara note which suggests IV Zosyn and podiatry evaluation.     Past Medical History:  Diagnosis Date  . Atrial fibrillation ([place])   . Coronary artery disease   . Diabetes mellitus without complication ([place])   . Hypertension   . Meningioma ([place])    Left frontal lobe     Patient Active Problem List   Diagnosis Date Noted  . Postmenopausal osteoporosis 05/18/2017  . Carpal tunnel syndrome on right 08/27/2016  . Persistent atrial fibrillation ([place]) 07/14/2016  . Backache 06/27/2016  . Aortic heart murmur 09/10/2015  . Macroalbuminuric diabetic nephropathy ([place]) 06/25/2015  . Essential (primary) hypertension 12/06/2014  . Cerebral meningioma ([place]) 05/19/2014  . Complication of diabetes mellitus ([place]) 05/19/2014  . History of neoplasm of bladder 05/19/2014  . Personal history of healed traumatic fracture 05/19/2014  . Diabetes mellitus type 2, controlled,  with complications ([place]) [id number]     Past Surgical History:  Procedure Laterality Date  . APPENDECTOMY    . CHOLECYSTECTOMY    . MELANOMA REMOVAL Right      Prior to Admission medications   Medication Sig Start Date End Date Taking? Authorizing Provider  aspirin 325 MG EC tablet Take 325 mg by mouth 2 (two) times daily.     [provider]  doxycycline (VIBRA-TABS) 100 MG tablet Take 1 tablet (100 mg total) by mouth 2 (two) times daily. 03/24/19   Lauree Chandler, NP  furosemide (LASIX) 20 MG tablet Take 20 mg by mouth daily. Take an extra pill qd if needed for swelling.    [provider]  Insulin Detemir (LEVEMIR FLEXPEN) 100 UNIT/ML Pen Inject 5-10 Units into the skin 2 (two) times daily. 5 units QAM 10 units QPM    [provider]  Insulin Syringe-Needle U-100 (INSULIN SYRINGE 1CC/31GX5/16") 31G X 5/16" 1 ML MISC  05/19/14   [provider]  losartan-hydrochlorothiazide (HYZAAR) 100-12.5 MG tablet Take 1 tablet by mouth daily.    [provider]  metFORMIN (GLUCOPHAGE) 1000 MG tablet 1,000 mg 2 (two) times daily with a meal.  04/13/15   [provider]  metoprolol tartrate (LOPRESSOR) 25 MG tablet Take 12.5 mg by mouth 2 (two) times daily.    [provider]  spironolactone (ALDACTONE) 25 MG tablet Take 25 mg by mouth daily.    [provider]     Allergies Alendronate, Iodinated diagnostic agents, and Statins   No family history on file.  Social History Social History  Tobacco Use  . Smoking status: Never Smoker  . Smokeless tobacco: Never Used  Substance Use Topics  . Alcohol use: Yes    Alcohol/week: 1.0 standard drinks    Types: 1 Glasses of wine per week  . Drug use: No    Review of Systems  Constitutional:   No fever or chills.  ENT:   No sore throat. No rhinorrhea. Cardiovascular:   No chest pain or syncope. Respiratory:   No dyspnea or cough. Gastrointestinal:   Negative for  abdominal pain, vomiting and diarrhea.  Musculoskeletal:   Bilateral foot pain as above All other systems reviewed and are negative except as documented above in ROS and HPI.  ____________________________________________   PHYSICAL EXAM:  VITAL SIGNS: ED Triage Vitals  Enc Vitals Group     BP 03/29/19 1729 115/69     Pulse Rate 03/29/19 1729 74     Resp 03/29/19 1729 18     Temp 03/29/19 1729 98.5 F (36.9 C)     Temp Source 03/29/19 1729 Oral     SpO2 03/29/19 1729 100 %     Weight 03/29/19 1732 193 lb (87.5 kg)     Height 03/29/19 1732 5\' 7"  (1.702 m)     Head Circumference --      Peak Flow --      Pain Score 03/29/19 1746 3     Pain Loc --      Pain Edu? --      Excl. in [place]? --     Vital signs reviewed, nursing assessments reviewed.   Constitutional:   Alert and oriented. Non-toxic appearance. Eyes:   Conjunctivae are normal. EOMI. PERRL. ENT      Head:   Normocephalic and atraumatic.      Nose:   Wearing a mask.      Mouth/Throat:   Wearing a mask.      Neck:   No meningismus. Full ROM. Hematological/Lymphatic/Immunilogical:   No cervical lymphadenopathy. Cardiovascular:   Irregularly irregular rhythm. Symmetric bilateral radial and DP pulses.  No murmurs. Cap refill less than 2 seconds. Respiratory:   Normal respiratory effort without tachypnea/retractions. Breath sounds are clear and equal bilaterally. No wheezes/rales/rhonchi. Gastrointestinal:   Soft and nontender. Non distended. There is no CVA tenderness.  No rebound, rigidity, or guarding.  Musculoskeletal:   Normal range of motion in all extremities. No joint effusions or ankle tenderness.  There is erythema swelling tenderness of bilateral dorsal midfoot, right greater than left.  Some hemorrhagic blisters on the right foot as well.  No streaking.  No crepitus.  Good peripheral cap refill..  There is several small superficial wounds on the right foot as well which are crusted. Neurologic:   Normal speech  and language.  Motor grossly intact. No acute focal neurologic deficits are appreciated.  Skin:    Skin is warm, dry with injuries as above.. No rash noted.  No petechiae, purpura, or bullae.  ____________________________________________    LABS (pertinent positives/negatives) (all labs ordered are listed, but only abnormal results are displayed) Labs Reviewed  BASIC METABOLIC PANEL - Abnormal; Notable for the following components:      Result Value   CO2 21 (*)    Glucose, Bld 135 (*)    BUN 85 (*)    Creatinine, Ser 1.97 (*)    GFR calc non Af Amer 22 (*)    GFR calc Af Amer 26 (*)    Anion gap 17 (*)    All other  components within normal limits  CBC - Abnormal; Notable for the following components:   RBC 3.77 (*)    All other components within normal limits  CULTURE, BLOOD (ROUTINE X 2)  CULTURE, BLOOD (ROUTINE X 2)  SARS CORONAVIRUS 2 (TAT 6-24 HRS)  LACTIC ACID, PLASMA  LACTIC ACID, PLASMA   ____________________________________________   EKG    ____________________________________________    RADIOLOGY  No results found.  ____________________________________________   PROCEDURES Procedures  ____________________________________________    CLINICAL IMPRESSION / ASSESSMENT AND PLAN / ED COURSE  Medications ordered in the ED: Medications  ceFEPIme (MAXIPIME) 2 g in sodium chloride 0.9 % 100 mL IVPB (2 g Intravenous New Bag/Given 03/29/19 1931)  vancomycin (VANCOCIN) IVPB 1000 mg/200 mL premix (has no administration in time range)  sodium chloride 0.9 % bolus 1,000 mL (1,000 mLs Intravenous New Bag/Given 03/29/19 1928)    Pertinent labs & imaging results that were available during my care of the patient were reviewed by me and considered in my medical decision making (see chart for details).  [name] was evaluated in Emergency Department on 03/29/2019 for the symptoms described in the history of present illness. She was evaluated in the context of  the global COVID-19 pandemic, which necessitated consideration that the patient might be at risk for infection with the SARS-CoV-2 virus that causes COVID-19. Institutional protocols and algorithms that pertain to the evaluation of patients at risk for COVID-19 are in a state of rapid change based on information released by regulatory bodies including the CDC and federal and state organizations. These policies and algorithms were followed during the patient's care in the ED.   Patient presents with cellulitis of bilateral feet, outpatient treatment failure.  Given her age and comorbidities including insulin-dependent diabetes, she will need hospitalization for IV antibiotics, IV fluids for her acute kidney injury.  She is not septic.  Doubt osteomyelitis abscess necrotizing fasciitis.      ____________________________________________   FINAL CLINICAL IMPRESSION(S) / ED DIAGNOSES    Final diagnoses:  Cellulitis of foot, right  Failure of outpatient treatment     ED Discharge Orders    None      Portions of this note were generated with dragon dictation software. Dictation errors may occur despite best attempts at proofreading.   Carrie Mew, MD 03/29/19 [place]

## 2019-03-29 NOTE — Telephone Encounter (Signed)
Called pt to follow up. She reports she went to her PCP on 03/25/2019 and he gave her the xray report. She has another follow up with his office today at 1:30pm. Started the doxycycline and tolerated well. He also did blood work on her which she reports was all "normal". She thanked me for her visit and reports her PCP office is taking care of things from here. She is aware we are here if she need anything else.

## 2019-03-30 ENCOUNTER — Inpatient Hospital Stay: Payer: Medicare Other

## 2019-03-30 DIAGNOSIS — I739 Peripheral vascular disease, unspecified: Secondary | ICD-10-CM | POA: Diagnosis present

## 2019-03-30 DIAGNOSIS — I4819 Other persistent atrial fibrillation: Secondary | ICD-10-CM

## 2019-03-30 DIAGNOSIS — I5032 Chronic diastolic (congestive) heart failure: Secondary | ICD-10-CM

## 2019-03-30 DIAGNOSIS — I10 Essential (primary) hypertension: Secondary | ICD-10-CM

## 2019-03-30 LAB — CBC
HCT: 31.9 % — ABNORMAL LOW (ref 36.0–46.0)
Hemoglobin: 10.5 g/dL — ABNORMAL LOW (ref 12.0–15.0)
MCH: 32.9 pg (ref 26.0–34.0)
MCHC: 32.9 g/dL (ref 30.0–36.0)
MCV: 100 fL (ref 80.0–100.0)
Platelets: 292 10*3/uL (ref 150–400)
RBC: 3.19 MIL/uL — ABNORMAL LOW (ref 3.87–5.11)
RDW: 14.2 % (ref 11.5–15.5)
WBC: 7.4 10*3/uL (ref 4.0–10.5)
nRBC: 0 % (ref 0.0–0.2)

## 2019-03-30 LAB — BASIC METABOLIC PANEL
Anion gap: 16 — ABNORMAL HIGH (ref 5–15)
BUN: 76 mg/dL — ABNORMAL HIGH (ref 8–23)
CO2: 23 mmol/L (ref 22–32)
Calcium: 8.9 mg/dL (ref 8.9–10.3)
Chloride: 99 mmol/L (ref 98–111)
Creatinine, Ser: 1.72 mg/dL — ABNORMAL HIGH (ref 0.44–1.00)
GFR calc Af Amer: 31 mL/min — ABNORMAL LOW (ref >60)
GFR calc non Af Amer: 26 mL/min — ABNORMAL LOW (ref >60)
Glucose, Bld: 174 mg/dL — ABNORMAL HIGH (ref 70–99)
Potassium: 4.6 mmol/L (ref 3.5–5.1)
Sodium: 138 mmol/L (ref 135–145)

## 2019-03-30 LAB — LACTIC ACID, PLASMA: Lactic Acid, Venous: 1.1 mmol/L (ref 0.5–1.9)

## 2019-03-30 LAB — GLUCOSE, CAPILLARY
Glucose-Capillary: 142 mg/dL — ABNORMAL HIGH (ref 70–99)
Glucose-Capillary: 119 mg/dL — ABNORMAL HIGH (ref 70–99)
Glucose-Capillary: 148 mg/dL — ABNORMAL HIGH (ref 70–99)
Glucose-Capillary: 201 mg/dL — ABNORMAL HIGH (ref 70–99)

## 2019-03-30 LAB — SEDIMENTATION RATE: Sed Rate: 35 mm/hr — ABNORMAL HIGH (ref 0–30)

## 2019-03-30 LAB — SARS CORONAVIRUS 2 (TAT 6-24 HRS): SARS Coronavirus 2: NEGATIVE

## 2019-03-30 LAB — C-REACTIVE PROTEIN: CRP: 0.9 mg/dL (ref <1.0)

## 2019-03-30 MED ORDER — SODIUM CHLORIDE 0.9 % IV SOLN
2.0000 g | INTRAVENOUS | Status: DC
  Administered 2019-03-31: 2 g via INTRAVENOUS
  Filled 2019-03-30 (×3): qty 2

## 2019-03-30 MED ORDER — INSULIN DETEMIR 100 UNIT/ML ~~LOC~~ SOLN
10.0000 [IU] | Freq: Every day | SUBCUTANEOUS | Status: DC
  Administered 2019-03-30 – 2019-04-07 (×9): 10 [IU] via SUBCUTANEOUS
  Filled 2019-03-30 (×10): qty 0.1

## 2019-03-30 NOTE — Progress Notes (Signed)
Pharmacy Antibiotic Note  [name] is a 85 y.o. female admitted on 03/29/2019 with LE swelling cellulitis w/ AKIPharmacy has been consulted for vanc/cefepime dosing. Patient received vanc 1.75g IV load and cefepime 2g IV x 1 and is currently in AKI w/ current Scr of 1.97 (baseline 1.1 2 months ago).  Plan: Will follow up w/ am BMP to reassess patient's renal function and will check a vanc random level w/ am labs 11/19.  If renal function appears to be improving on am labs will schedule random level sooner in 12 hours and will readjust cefepime dose.  Height: 5\' 7"  (170.2 cm) Weight: 193 lb (87.5 kg) IBW/kg (Calculated) : 61.6  Temp (24hrs), Avg:98.5 F (36.9 C), Min:98.4 F (36.9 C), Max:98.5 F (36.9 C)  Recent Labs  Lab 03/29/19 1758 03/29/19 1917  WBC 10.4  --   CREATININE 1.97*  --   LATICACIDVEN  --  3.2*    Estimated Creatinine Clearance: 23.3 mL/min (A) (by C-G formula based on SCr of 1.97 mg/dL (H)).    Allergies  Allergen Reactions  . Alendronate Other (See Comments)    Cramping of the jaw  . Iodinated Diagnostic Agents Other (See Comments)    Brother has allergy and she wants not to receive it   . Statins Other (See Comments)    Fatigue/muscle weakness Fatigue/muscle weakness Fatigue/muscle weakness    Thank you for allowing pharmacy to be a part of this patient's care.  Tobie Lords, PharmD, BCPS Clinical Pharmacist 03/30/2019 1:20 AM

## 2019-03-30 NOTE — ED Notes (Signed)
Pt visualized resting in bed at this time, lights remain dimmed for patient comfort. Pt with even and unlabored respirations at this time. Will continue to monitor for further patient needs.

## 2019-03-30 NOTE — Progress Notes (Signed)
PROGRESS NOTE    [name]  PVV:[id number] DOB: Dec 16, 1930 DOA: 03/29/2019 PCP: Rusty Aus, MD    Brief Narrative:  [name] is a 85 y.o. female with history of diabetes mellitus type 2, chronic diastolic dysfunction, hypertension had an injury to have both feet when and heavy object fell onto it about 10 days ago.  The feet started swelling become erythematous with wound and had followed up with her primary care physician who prescribed antibiotics.  Despite taking which patient's wound has been getting worse.  Labs were done which showed worsening renal function and patient was instructed to come to the ER.  Patient states about 2 to 3 weeks ago patient did have some diarrhea but nothing further from that.  Denies any vomiting or abdominal pain chest pain or shortness of breath.  X-rays done of the feet on November 13 showed swelling of the right foot with second toe proximal phalanx fracture.  ED Course: In the ER labs reveal creatinine 1.9 with a lactate of 3.2 creatinine is increased from baseline of around 1.1 in September 2020.  Other labs show WBC of 10.4 globin 12.3 platelets 350.  COVID-19 test is pending blood cultures were obtained started on empiric antibiotics fluids and admitted for cellulitis of the both lower extremity with acute renal failure.  Interim History: 11/18: ABI: showed severe right lower extremity arterial occlusive disease at rest. Left ABI may be artifactually elevated due to vascular noncompressibility, underestimating degree of disease.  Subjective:  severe pain in both feet (the right foot is worse than the left). No fever or chills. No CP, SOB, no nausea, vomiting, diarrhea or abdominal pain.  Assessment & Plan:   Principal Problem:   Cellulitis of both lower extremities Active Problems:   Essential (primary) hypertension   Persistent atrial fibrillation (HCC)   Diabetes mellitus type 2, controlled, with complications (HCC)   ARF (acute  renal failure) (HCC)   Chronic diastolic CHF (congestive heart failure) (HCC)   PVD (peripheral vascular disease) (HCC)   Cellulitis of both lower extremities: patient has failed outpatient oral antibiotics for cellulitis. Note that patient's x-rays did show second toe proximal phalanx fracture. ABI showed severe right lower extremity arterial occlusive disease at rest. Left ABI may be artifactually elevated due to vascular noncompressibility, underestimating degree of disease.   -will continue vanco and cefepime -Follow-up ESR and CRP -Follow-up wound culture (so far no growth)  PVD: as shown by ABI. -Will consult VVS  AKI: could be from prerenal cause check UA FENa -hold diuretics ARB - gently hydrate, NS 75 cc/h - Follow intake output.  Hypertension: - holding ARB due to renal failure.  - Continue beta-blockers. -  As needed IV hydralazine  History of A. fib mention in the chart.  - Presently only on aspirin and beta-blocker.  Diabetes mellitus type 2, controlled, with complications: Dibetes mellitus type 2 on Levemir.  Patient states her blood sugars have been running low recently.  Given that patient has renal failure will decrease her insulin to usually take Levemir twice daily,  we will keep only on once daily dose for now.  Sliding scale coverage.  Hemoglobin A1c was 6.32 months ago.   DVT prophylaxis: Heparin. Code Status: Full code. Family Communication: Discussed with patient. Disposition Plan: Home. Consults called: Wound team. Admission status: Inpatient.    DVT prophylaxis: sq heparin Code Status: full Family Communication: no, family not at bedside Disposition Plan: inpt. Pt still has severe foot pain.  ABI showed severe right lower extremity arterial occlusive disease at rest, need further work up and VVS consult    Consultants:   none  Procedures:    Antimicrobials:  Anti-infectives (From admission, onward)   Start     Dose/Rate Route Frequency  Ordered Stop   03/30/19 2000  ceFEPIme (MAXIPIME) 2 g in sodium chloride 0.9 % 100 mL IVPB     2 g 200 mL/hr over 30 Minutes Intravenous Every 24 hours 03/30/19 0124     03/29/19 2145  vancomycin (VANCOCIN) 1,750 mg in sodium chloride 0.9 % 500 mL IVPB     1,750 mg 250 mL/hr over 120 Minutes Intravenous  Once 03/29/19 2143 03/30/19 0035   03/29/19 1915  ceFEPIme (MAXIPIME) 2 g in sodium chloride 0.9 % 100 mL IVPB     2 g 200 mL/hr over 30 Minutes Intravenous  Once 03/29/19 1901 03/29/19 2001   03/29/19 1915  vancomycin (VANCOCIN) IVPB 1000 mg/200 mL premix  Status:  Discontinued     1,000 mg 200 mL/hr over 60 Minutes Intravenous  Once 03/29/19 1901 03/29/19 2143        Objective: Vitals:   03/30/19 0858 03/30/19 1156 03/30/19 1610 03/30/19 1643  BP: (!) 148/65 (!) 112/97 (!) 155/80 (!) 158/71  Pulse: 75 79 77 71  Resp: (!) '22 15 18 20  ' Temp: 98 F (36.7 C)   97.6 F (36.4 C)  TempSrc: Oral   Oral  SpO2: 97% 95% 98% (!) 88%  Weight:      Height:        Intake/Output Summary (Last 24 hours) at 03/30/2019 1918 Last data filed at 03/30/2019 1602 Gross per 24 hour  Intake 1921.39 ml  Output -  Net 1921.39 ml   Filed Weights   03/29/19 1732  Weight: 87.5 kg    Examination:  Physical Exam:  General: Not in acute distress HEENT: PERRL, EOMI, no scleral icterus, No JVD or bruit Cardiac: S1/S2, RRR, gallops or rubs Pulm: Clear to auscultation bilaterally. No rales, wheezing, rhonchi or rubs. Abd: Soft, nondistended, nontender, no rebound pain, no organomegaly, BS present Ext: has bilateral foot swelling, erythema and tenderness, scabbed wounds in both feet. Musculoskeletal: No joint deformities, erythema, or stiffness, ROM full Skin: No rashes.  Neuro: Alert and oriented X3, cranial nerves II-XII grossly intact, moves all extremeties normally.  Psych: Patient is not psychotic, no suicidal or hemocidal ideation.    Data Reviewed: I have personally reviewed following  labs and imaging studies  CBC: Recent Labs  Lab 03/29/19 1758 03/30/19 0540  WBC 10.4 7.4  HGB 12.3 10.5*  HCT 37.3 31.9*  MCV 98.9 100.0  PLT 350 161   Basic Metabolic Panel: Recent Labs  Lab 03/29/19 1758 03/30/19 0540  NA 137 138  K 4.2 4.6  CL 99 99  CO2 21* 23  GLUCOSE 135* 174*  BUN 85* 76*  CREATININE 1.97* 1.72*  CALCIUM 9.9 8.9   GFR: Estimated Creatinine Clearance: 26.7 mL/min (A) (by C-G formula based on SCr of 1.72 mg/dL (H)). Liver Function Tests: No results for input(s): AST, ALT, ALKPHOS, BILITOT, PROT, ALBUMIN in the last 168 hours. No results for input(s): LIPASE, AMYLASE in the last 168 hours. No results for input(s): AMMONIA in the last 168 hours. Coagulation Profile: No results for input(s): INR, PROTIME in the last 168 hours. Cardiac Enzymes: No results for input(s): CKTOTAL, CKMB, CKMBINDEX, TROPONINI in the last 168 hours. BNP (last 3 results) No results for input(s): PROBNP in  the last 8760 hours. HbA1C: No results for input(s): HGBA1C in the last 72 hours. CBG: Recent Labs  Lab 03/30/19 0912 03/30/19 1142 03/30/19 1727  GLUCAP 142* 119* 148*   Lipid Profile: No results for input(s): CHOL, HDL, LDLCALC, TRIG, CHOLHDL, LDLDIRECT in the last 72 hours. Thyroid Function Tests: No results for input(s): TSH, T4TOTAL, FREET4, T3FREE, THYROIDAB in the last 72 hours. Anemia Panel: No results for input(s): VITAMINB12, FOLATE, FERRITIN, TIBC, IRON, RETICCTPCT in the last 72 hours. Sepsis Labs: Recent Labs  Lab 03/29/19 1917 03/30/19 0852  LATICACIDVEN 3.2* 1.1    Recent Results (from the past 240 hour(s))  Blood Culture (routine x 2)     Status: None (Preliminary result)   Collection Time: 03/29/19  7:06 PM   Specimen: BLOOD  Result Value Ref Range Status   Specimen Description BLOOD RIGHT ANTECUBITAL  Final   Special Requests   Final    BOTTLES DRAWN AEROBIC AND ANAEROBIC Blood Culture adequate volume   Culture   Final    NO GROWTH  < 12 HOURS Performed at [facility], [address], [place], [place] [address]    Report Status PENDING  Incomplete  Blood Culture (routine x 2)     Status: None (Preliminary result)   Collection Time: 03/29/19  7:17 PM   Specimen: BLOOD  Result Value Ref Range Status   Specimen Description BLOOD BLOOD RIGHT ARM  Final   Special Requests   Final    BOTTLES DRAWN AEROBIC AND ANAEROBIC Blood Culture adequate volume   Culture   Final    NO GROWTH < 12 HOURS Performed at [facility], [address], [place], [place] [address]    Report Status PENDING  Incomplete  SARS CORONAVIRUS 2 (TAT 6-24 HRS) Nasopharyngeal Nasopharyngeal Swab     Status: None   Collection Time: 03/29/19  7:17 PM   Specimen: Nasopharyngeal Swab  Result Value Ref Range Status   SARS Coronavirus 2 NEGATIVE NEGATIVE Final    Comment: (NOTE) SARS-CoV-2 target nucleic acids are NOT DETECTED. The SARS-CoV-2 RNA is generally detectable in upper and lower respiratory specimens during the acute phase of infection. Negative results do not preclude SARS-CoV-2 infection, do not rule out co-infections with other pathogens, and should not be used as the sole basis for treatment or other patient management decisions. Negative results must be combined with clinical observations, patient history, and epidemiological information. The expected result is Negative. Fact Sheet for Patients: SugarRoll.be Fact Sheet for Healthcare Providers: https://www.woods-mathews.com/ This test is not yet approved or cleared by the [place] FDA and  has been authorized for detection and/or diagnosis of SARS-CoV-2 by FDA under an Emergency Use Authorization (EUA). This EUA will remain  in effect (meaning this test can be used) for the duration of the COVID-19 declaration under Section 56 4(b)(1) of the Act, 21 U.S.C. section 360bbb-3(b)(1), unless the authorization is terminated  or revoked sooner. Performed at [facility], [place] [address], [place],  [address]      Radiology Studies: US Arterial Burnard Bunting (screening Lower Extremity)  Result Date: 03/30/2019 CLINICAL DATA:  Bilateral vascular ulcers, skin color change. Hypertension, diabetes. EXAM: NONINVASIVE PHYSIOLOGIC VASCULAR STUDY OF BILATERAL LOWER EXTREMITIES TECHNIQUE: Evaluation of both lower extremities were performed at rest, including calculation of ankle-brachial indices with single level Doppler, pressure and pulse volume recording. COMPARISON:  None. FINDINGS: Right ABI:  0.48 Left ABI:  1.19 Right Lower Extremity:  Monophasic arterial waveforms at the ankle. Left  Lower Extremity:  Biphasic arterial waveforms at the ankle. IMPRESSION: Severe right lower extremity arterial occlusive disease at rest. Left ABI may be artifactually elevated due to vascular noncompressibility, underestimating degree of disease. Electronically Signed   By: Lucrezia Europe M.D.   On: 03/30/2019 12:18        Scheduled Meds: . aspirin  325 mg Oral BID  . heparin  5,000 Units Subcutaneous Q8H  . insulin aspart  0-9 Units Subcutaneous TID WC  . insulin detemir  5 Units Subcutaneous q morning - 10a  . metoprolol tartrate  12.5 mg Oral BID   Continuous Infusions: . sodium chloride 75 mL/hr at 03/30/19 1856  . ceFEPime (MAXIPIME) IV       LOS: 1 day    Time spent: 30 min     Ivor Costa, DO [facility] PAGER is on [place]  If 7PM-7AM, please contact night-coverage [email or web address] Password [facility] 03/30/2019, 7:18 PM

## 2019-03-30 NOTE — Progress Notes (Signed)
MD notified: Patient came up from ED. refused to take sliding scale insulin. Indicates she takes levemir insulin at home and does not want to mix doses. The patient was educated but want to continue to take Levemir and she indicates if dose needs to be higher than dose taken at home she would be find with that.

## 2019-03-30 NOTE — ED Notes (Addendum)
Tiffany, A.D. at bedside, due to patient repeatedly stating to this RN and Joelene Millin, NT that she would like her demands passed along to administration. Pt states that she demands to be placed into a room because "as part of the treatment for my cellulitis it demands to be elevated and it's not elevated". This RN explained patient's leg was elevated, offered to place more pillows and more pillows placed by Joelene Millin, NT for patient comfort. This RN apologized for delay in going to a room however explained to patient long holds for admission. Pt states "well I understand but I need to be put in a room". Pt states to this RN, "Please pass them along to your administration and let them know this is not a request". This RN notified Tiffany, A.D. of patient's demands, currently at bedside speaking with patient.

## 2019-03-30 NOTE — ED Notes (Signed)
Pt up to the bathroom with this RN and Debi, Therapist, sports. Pt back to bed at this time without incident. Pt placed in hospital bed for comfort at this time. Pt states "why doesn't it elevate more?" This RN explained that this is the exact bed that she would receive should she be admitted to the floor. Pt states "No it's not I've seen beds with lots of elevation for the legs like for a broken leg". This RN explained that we did not have that kind of bed here. Pt states "Oh yes you do, so there are no other beds in the whole hopsital?" This RN explained that special considerations for bariatric beds however pt did not qualify for a bariatric bed. Pt states "I don't know who I need to talk to to complain but my care needs are just not being met". Pt given the remote to be able to self adjust her bed to her liking. Will continue to monitor for further patient needs.

## 2019-03-30 NOTE — ED Notes (Signed)
This RN to bedside at this time, introduced self to patient, pt awakens easily to mild verbal stimuli. Pt is alert and oriented during assessment. A-fib on the monitor. This RN apologized for delay in patient being admitted upstairs, pt states understanding. Repeat lactic collected at lactic from 11/17 was 3.2.

## 2019-03-30 NOTE — ED Notes (Signed)
Pt transported to [place] at this time. This RN informed by [place] tech that patient pulled her IV out of L AC during transfer from recliner to bed. This RN initiated new IV. Fluids paused while patient in [place] at this time, will re-initiate when patient returns.

## 2019-03-30 NOTE — ED Notes (Signed)
Pt resting in bed at this time eating lunch.

## 2019-03-30 NOTE — Consult Note (Signed)
WOC Nurse Consult Note: Reason for Consult: bilateral LE wounds  Wound type: bulla related to cellulitis. Patient does not report any injury to the area. Unknown etiology.  Timeline on presentation not clear   Pressure Injury POA: NA Measurement: Right dorsal foot: 1.0cm x 4.0cm x 0cm Left dorsal foot: 1cm x 1.5cm x 0cm  Right lateral great toe: 0.5cm x 0.5cm x 0cm Wound bed: both areas on the bilateral dorsal foot intact blood filled bulla; with surrounding non blanchable purple tissue. Right lateral great toe: 100% eschar Drainage (amount, consistency, odor) none, intact skin  Periwound: edema, palpable distal pulses, TTP Dressing procedure/placement/frequency: Single layer of xeroform over the dorsal foot wounds, top with dry dressing, secure with conform gauze.  No topical care to the right great toe wound.  ABIs being started when [place] leaving room. Based on results may need vascular; appears to have no circulatory issues.   Discussed POC with patient and bedside nurse.  Re consult if needed, will not follow at this time. Thanks  Laurella Tull [employer], RN,CWOCN, CNS, [place] [phone])

## 2019-03-30 NOTE — ED Notes (Signed)
Wound care consult at bedside at this time.

## 2019-03-30 NOTE — ED Notes (Signed)
Pt returned from [place] at this time. Fluids re-started by this RN. Pt states that she wants to speak to kitchen regarding the "sorry excuse for a breakfast tray" that she was sent for breakfast. This RN explained that patient had diet order in and that the kitchen sent trays based on diet order. Pt states "No, no, a biscuit and a pathetic egg is not good for a diabetic patient". This RN explained that in the ED patient's did not receive a menu from which to order. This RN apologized for that fact and relayed that this RN would call dietary and speak with them regarding patient's meal. Pt then had this RN cover her up with blankets, then reposition blankets, then reposition blankets again. Pt states "I'm just a crotchety old lady".

## 2019-03-30 NOTE — Progress Notes (Signed)
Pharmacy Antibiotic Note  [name] is a 85 y.o. female admitted on 03/29/2019 with LE swelling cellulitis w/ AKIPharmacy has been consulted for vanc/cefepime dosing. Patient received vanc 1.75g IV load and cefepime 2g IV x 1 and is currently in AKI w/ current Scr of 1.97 (baseline 1.1 2 months ago).  Plan: Will follow up w/ am BMP to reassess patient's renal function and will check a vanc random level w/ am labs 11/19.   11/18 @ 0500 Scr 1.72 has improved but not by much, will continue current plan and check vanc random w/ am labs 11/19. Will continue to monitor.  Height: 5\' 7"  (170.2 cm) Weight: 193 lb (87.5 kg) IBW/kg (Calculated) : 61.6  Temp (24hrs), Avg:98.2 F (36.8 C), Min:97.8 F (36.6 C), Max:98.5 F (36.9 C)  Recent Labs  Lab 03/29/19 1758 03/29/19 1917 03/30/19 0540  WBC 10.4  --  7.4  CREATININE 1.97*  --  1.72*  LATICACIDVEN  --  3.2*  --     Estimated Creatinine Clearance: 26.7 mL/min (A) (by C-G formula based on SCr of 1.72 mg/dL (H)).    Allergies  Allergen Reactions  . Alendronate Other (See Comments)    Cramping of the jaw  . Iodinated Diagnostic Agents Other (See Comments)    Brother has allergy and she wants not to receive it   . Statins Other (See Comments)    Fatigue/muscle weakness Fatigue/muscle weakness Fatigue/muscle weakness    Thank you for allowing pharmacy to be a part of this patient's care.  Tobie Lords, PharmD, BCPS Clinical Pharmacist 03/30/2019 7:25 AM

## 2019-03-30 NOTE — ED Notes (Addendum)
Pt assisted to the bathroom by this RN and Debi, RN. Pt back to room, this RN placed a recliner in room for patient comfort. Pt given breakfast tray at this time.

## 2019-03-30 NOTE — ED Notes (Signed)
This RN spoke with dining services, per dining services will send down different tray for patient per her request. Pt currently on phone with her nephew at this time. Pt looks at this RN and asks, "so what are they doing about my cellulitis?" This RN explained to patient would be receiving IV abx for cellulitis, pt can be overheard on phone with nephew "well I just think I'd be better off at home because I don't know how they are going to elevate me down here in the ER, they aren't elevating my foot". Pt's foot not elevated while in [place], this RN replaced pillow under R foot as it had been all night prior to patient going to [place].

## 2019-03-30 NOTE — ED Notes (Signed)
This RN explained waiting for hospital bed, pt states "see sometimes it pays to talk to administration". Pt A&O x 4, NAD noted at this time.

## 2019-03-31 DIAGNOSIS — I4891 Unspecified atrial fibrillation: Secondary | ICD-10-CM

## 2019-03-31 DIAGNOSIS — I70244 Atherosclerosis of native arteries of left leg with ulceration of heel and midfoot: Secondary | ICD-10-CM

## 2019-03-31 DIAGNOSIS — I739 Peripheral vascular disease, unspecified: Secondary | ICD-10-CM

## 2019-03-31 DIAGNOSIS — I70234 Atherosclerosis of native arteries of right leg with ulceration of heel and midfoot: Secondary | ICD-10-CM

## 2019-03-31 LAB — GLUCOSE, CAPILLARY
Glucose-Capillary: 110 mg/dL — ABNORMAL HIGH (ref 70–99)
Glucose-Capillary: 133 mg/dL — ABNORMAL HIGH (ref 70–99)
Glucose-Capillary: 145 mg/dL — ABNORMAL HIGH (ref 70–99)
Glucose-Capillary: 152 mg/dL — ABNORMAL HIGH (ref 70–99)

## 2019-03-31 LAB — BASIC METABOLIC PANEL
Anion gap: 13 (ref 5–15)
BUN: 53 mg/dL — ABNORMAL HIGH (ref 8–23)
CO2: 23 mmol/L (ref 22–32)
Calcium: 9.7 mg/dL (ref 8.9–10.3)
Chloride: 105 mmol/L (ref 98–111)
Creatinine, Ser: 1.34 mg/dL — ABNORMAL HIGH (ref 0.44–1.00)
GFR calc Af Amer: 41 mL/min — ABNORMAL LOW (ref >60)
GFR calc non Af Amer: 36 mL/min — ABNORMAL LOW (ref >60)
Glucose, Bld: 99 mg/dL (ref 70–99)
Potassium: 4.3 mmol/L (ref 3.5–5.1)
Sodium: 141 mmol/L (ref 135–145)

## 2019-03-31 LAB — CBC
HCT: 36.1 % (ref 36.0–46.0)
Hemoglobin: 12.3 g/dL (ref 12.0–15.0)
MCH: 32.7 pg (ref 26.0–34.0)
MCHC: 34.1 g/dL (ref 30.0–36.0)
MCV: 96 fL (ref 80.0–100.0)
Platelets: 331 10*3/uL (ref 150–400)
RBC: 3.76 MIL/uL — ABNORMAL LOW (ref 3.87–5.11)
RDW: 14.5 % (ref 11.5–15.5)
WBC: 7 10*3/uL (ref 4.0–10.5)
nRBC: 0 % (ref 0.0–0.2)

## 2019-03-31 LAB — VANCOMYCIN, RANDOM: Vancomycin Rm: 12

## 2019-03-31 MED ORDER — SODIUM CHLORIDE 0.9 % IV SOLN
INTRAVENOUS | Status: DC | PRN
  Administered 2019-03-31 – 2019-04-01 (×3): 1000 mL via INTRAVENOUS

## 2019-03-31 MED ORDER — DOCUSATE SODIUM 100 MG PO CAPS
100.0000 mg | ORAL_CAPSULE | Freq: Two times a day (BID) | ORAL | Status: DC
  Administered 2019-03-31 – 2019-04-06 (×6): 100 mg via ORAL
  Filled 2019-03-31 (×15): qty 1

## 2019-03-31 MED ORDER — SODIUM CHLORIDE 0.9 % IV SOLN
2.0000 g | Freq: Two times a day (BID) | INTRAVENOUS | Status: DC
  Administered 2019-03-31 – 2019-04-02 (×5): 2 g via INTRAVENOUS
  Filled 2019-03-31 (×8): qty 2

## 2019-03-31 MED ORDER — VANCOMYCIN HCL IN DEXTROSE 1-5 GM/200ML-% IV SOLN
1000.0000 mg | INTRAVENOUS | Status: DC
  Administered 2019-03-31: 07:00:00 1000 mg via INTRAVENOUS
  Filled 2019-03-31 (×2): qty 200

## 2019-03-31 NOTE — Progress Notes (Signed)
MD notified: This patient only takes insulin levemir at home and novolog insulin has ordered for meal coverages. She has been refusing the doses when required. She wants to know if you could D/C the novolog insulin(sliding scale) and only keep her on the levemir. She has been educated but continuous to refuse the medication.

## 2019-03-31 NOTE — Progress Notes (Signed)
Pharmacy Antibiotic Note  [name] is a 85 y.o. female admitted on 03/29/2019 with LE swelling cellulitis w/ AKIPharmacy has been consulted for vanc/cefepime dosing. Patient received vanc 1.75g IV load and cefepime 2g IV x 1 and is currently in AKI w/ current Scr of 1.97 (baseline 1.1 2 months ago).  Plan: -Vanc:  11/19 @ 0500 VR: 12 mcg/mL. Will start following regimen: Vancomycin 1000 mg IV Q 36 hrs. Goal AUC 400-550. Expected AUC: 530.4 SCr used: 1.34 Cssmin: 13.0 Will continue to monitor s/sx of infx and renal function and adjust as needed.  -Cefepime 2gm q24h. Renal fxn improved. Will adjust dose to 2 gm IV q12h for crcl 34.3 ml/min    Height: 5\' 7"  (170.2 cm) Weight: 193 lb 5.5 oz (87.7 kg) IBW/kg (Calculated) : 61.6  Temp (24hrs), Avg:97.9 F (36.6 C), Min:97.6 F (36.4 C), Max:98.2 F (36.8 C)  Recent Labs  Lab 03/29/19 1758 03/29/19 1917 03/30/19 0540 03/30/19 0852 03/31/19 0432  WBC 10.4  --  7.4  --  7.0  CREATININE 1.97*  --  1.72*  --  1.34*  LATICACIDVEN  --  3.2*  --  1.1  --   VANCORANDOM  --   --   --   --  12    Estimated Creatinine Clearance: 34.3 mL/min (A) (by C-G formula based on SCr of 1.34 mg/dL (H)).    Allergies  Allergen Reactions  . Alendronate Other (See Comments)    Cramping of the jaw  . Iodinated Diagnostic Agents Other (See Comments)    Brother has allergy and she wants not to receive it   . Statins Other (See Comments)    Fatigue/muscle weakness Fatigue/muscle weakness Fatigue/muscle weakness    Thank you for allowing pharmacy to be a part of this patient's care.  Chinita [place] PharmD Clinical Pharmacist 03/31/2019

## 2019-03-31 NOTE — Consult Note (Signed)
[facility] Vascular Consult Note  MRN : [id number]  [name] is a 85 y.o. (1931/03/27) female who presents with chief complaint of  Chief Complaint  Patient presents with  . Abnormal Lab  .  History of Present Illness:   I am asked to evaluate the patient by Dr. Soledad Gerlach.  She is an 85 year old woman who has been living independently at [facility].  Approximately 10 to 14 days ago she sustained bilateral foot trauma after dropping a heavy walker across both her feet.  Subsequently she developed ulcerations associated with cellulitis.  She was seen in the emergency room on November 17th  and subsequently admitted and started on IV antibiotics.  Examination by both the hospitalist staff as well as the ER demonstrated nonpalpable pedal pulses and I have been asked to evaluate for PAD in association with ulceration.  The patient denies claudication symptoms.  She has never had any open wound sores or infections in the past.  She is unaware that she has peripheral vascular disease.  She has never had a cardiac or peripheral catheterization.  Current Meds  Medication Sig  . aspirin 325 MG EC tablet Take 325 mg by mouth 2 (two) times daily.   Marland Kitchen doxycycline (VIBRA-TABS) 100 MG tablet Take 1 tablet (100 mg total) by mouth 2 (two) times daily.  . furosemide (LASIX) 20 MG tablet Take 20 mg by mouth daily. Take an extra pill qd if needed for swelling.  Marland Kitchen ibuprofen (ADVIL) 200 MG tablet Take 200 mg by mouth every 6 (six) hours as needed.  . Insulin Detemir (LEVEMIR FLEXPEN) 100 UNIT/ML Pen Inject 5-10 Units into the skin 2 (two) times daily. 5 units QAM 10 units QPM  . losartan-hydrochlorothiazide (HYZAAR) 100-12.5 MG tablet Take 1 tablet by mouth daily.  . metFORMIN (GLUCOPHAGE) 1000 MG tablet 1,000 mg 2 (two) times daily with a meal.   . metoprolol tartrate (LOPRESSOR) 25 MG tablet Take 12.5 mg by mouth 2 (two) times daily.  Marland Kitchen spironolactone (ALDACTONE) 25 MG  tablet Take 25 mg by mouth daily.    Past Medical History:  Diagnosis Date  . Atrial fibrillation ([place])   . Coronary artery disease   . Diabetes mellitus without complication ([place])   . Hypertension   . Meningioma ([place])    Left frontal lobe    Past Surgical History:  Procedure Laterality Date  . APPENDECTOMY    . CHOLECYSTECTOMY    . MELANOMA REMOVAL Right     Social History Social History   Tobacco Use  . Smoking status: Never Smoker  . Smokeless tobacco: Never Used  Substance Use Topics  . Alcohol use: Yes    Alcohol/week: 1.0 standard drinks    Types: 1 Glasses of wine per week  . Drug use: No    Family History Family History  Family history unknown: Yes  No family history of bleeding/clotting disorders, porphyria or autoimmune disease   Allergies  Allergen Reactions  . Alendronate Other (See Comments)    Cramping of the jaw  . Iodinated Diagnostic Agents Other (See Comments)    Brother has allergy and she wants not to receive it   . Statins Other (See Comments)    Fatigue/muscle weakness Fatigue/muscle weakness Fatigue/muscle weakness     REVIEW OF SYSTEMS (Negative unless checked)  Constitutional: [] Weight loss  [] Fever  [] Chills Cardiac: [] Chest pain   [] Chest pressure   [] Palpitations   [] Shortness of breath when laying flat   [] Shortness of  breath at rest   [] Shortness of breath with exertion. Vascular:  [] Pain in legs with walking   [] Pain in legs at rest   [] Pain in legs when laying flat   [] Claudication   [] Pain in feet when walking  [] Pain in feet at rest  [] Pain in feet when laying flat   [] History of DVT   [] Phlebitis   [] Swelling in legs   [] Varicose veins   [x] Non-healing ulcers Pulmonary:   [] Uses home oxygen   [] Productive cough   [] Hemoptysis   [] Wheeze  [] COPD   [] Asthma Neurologic:  [] Dizziness  [] Blackouts   [] Seizures   [] History of stroke   [] History of TIA  [] Aphasia   [] Temporary blindness   [] Dysphagia   [] Weakness or numbness in  arms   [x] Weakness or numbness in legs Musculoskeletal:  [x] Arthritis   [x] Joint swelling   [] Joint pain   [] Low back pain Hematologic:  [] Easy bruising  [] Easy bleeding   [] Hypercoagulable state   [] Anemic  [] Hepatitis Gastrointestinal:  [] Blood in stool   [] Vomiting blood  [] Gastroesophageal reflux/heartburn   [] Difficulty swallowing. Genitourinary:  [x] Chronic kidney disease   [] Difficult urination  [] Frequent urination  [] Burning with urination   [] Blood in urine Skin:  [] Rashes   [x] Ulcers   [x] Wounds Psychological:  [] History of anxiety   []  History of major depression.    Physical Examination  Vitals:   03/31/19 0500 03/31/19 0904 03/31/19 1210 03/31/19 1530  BP:  138/70 (!) 154/70 (!) 152/65  Pulse:  68 62 68  Resp:  14 17 16   Temp:  97.7 F (36.5 C) 98.2 F (36.8 C) 97.9 F (36.6 C)  TempSrc:  Oral Oral Oral  SpO2:  100% 100% 99%  Weight: 87.7 kg     Height:       Body mass index is 30.28 kg/m.  Head: [place]/AT, No temporalis wasting.  Ear/Nose/Throat: Nares w/o erythema or drainage, oropharynx w/o obsrtuction, Mallampati score: Class III.   Eyes: PERRLA, Sclera nonicteric.  Neck: Supple, no rigidity.  No  JVD.  Pulmonary: No audible wheezing, no use of accessory muscles.  Vascular: Popliteal pulses are nonpalpable on the right 2+ on the left.  Dorsalis pedis and posterior tibial pulses are nonpalpable bilaterally.  Both feet are bandaged across the forefoot.  There is 2-3+ edema and mild venous stasis dermatitis noted. Gastrointestinal: soft, non-tender, non-distended.  Musculoskeletal: Moves all extremities.  Arthritic deformity multiple joints.  2+ edema. Neurologic: CN 2-12 intact. Symmetrical.  Speech is fluent.  Psychiatric: Judgment intact, Mood & affect appropriate for pt's clinical situation. Dermatologic: No rashes or ulcers noted.  + cellulitis associated with her foot wounds. Lymph : No Cervical,  or Inguinal lymphadenopathy.      CBC Lab Results   Component Value Date   WBC 7.0 03/31/2019   HGB 12.3 03/31/2019   HCT 36.1 03/31/2019   MCV 96.0 03/31/2019   PLT 331 03/31/2019    BMET    Component Value Date/Time   NA 141 03/31/2019 0432   NA 136 (A) 01/12/2019   K 4.3 03/31/2019 0432   CL 105 03/31/2019 0432   CO2 23 03/31/2019 0432   GLUCOSE 99 03/31/2019 0432   BUN 53 (H) 03/31/2019 0432   BUN 30 (A) 01/12/2019   CREATININE 1.34 (H) 03/31/2019 0432   CALCIUM 9.7 03/31/2019 0432   GFRNONAA 36 (L) 03/31/2019 0432   GFRAA 41 (L) 03/31/2019 0432   Estimated Creatinine Clearance: 34.3 mL/min (A) (by C-G formula based on SCr of  1.34 mg/dL (H)).  COAG No results found for: INR, PROTIME    Assessment/Plan 1.  Atherosclerotic occlusive disease bilateral lower extremities associated with open wounds of the dorsum of both feet and cellulitis:  The patient likely will require angiography and revascularization.  She repeatedly stated that she is a very conservative person and does not want to rush into anything.  She is concerned about the contrast media, rightly so.  However given her injury it is unlikely that she will be able to resolve this without revascularization.  I did discuss this with her.  She does have a sister who had what sounds like vascular interventions but ultimately did and with an amputation.  This is also of great concern to her.  For the time being I would continue antibiotic therapy to treat her cellulitis and local wound care.  I will reassess the patient and continue my discussion with her regarding optimizing her wound healing potential.   A total of 70 minutes was spent with this patient and greater than 50% was spent in counseling and coordination of care with the patient.  Discussion included the treatment options for vascular disease including indications for surgery and intervention.  Also discussed is the appropriate timing of treatment.  In addition medical therapy was discussed.  2.  Renal  insufficiency: With hydration and control of her infection her renal function has been improving on admission her GFR was 22 and she is now 36 based on her morning labs.  She is quite encouraged by this and this is one of the reasons she wishes to give it a few more days before she would consider angiography.  I think this is a reasonable request and certainly continued improvement in her kidney function would make exposure to a contrast agent more easily tolerated.  3.  Atrial fibrillation: Continue antiarrhythmia medications as already ordered, these medications have been reviewed and there are no changes at this time.  Continue anticoagulation as ordered by Cardiology Service  4.  Diabetes mellitus with vascular occlusive disease: Her diabetes will also have a negative impact on wound healing and consequently supports revascularization.  Continue hypoglycemic medications as already ordered, these medications have been reviewed and there are no changes at this time.  Hgb A1C to be monitored as already arranged by primary service    Hortencia Pilar, MD  03/31/2019 8:22 PM

## 2019-03-31 NOTE — Evaluation (Signed)
Physical Therapy Evaluation Patient Details Name: [name] MRN: [id number] DOB: 04/07/1931 Today's Date: 03/31/2019   History of Present Illness  Pt. is an 85 y.o. female who was admitted to [facility] with LE edema, cellulitis, and worsening renal function. Pt. sustained an injury 10 days ago when a heavy object fell on her both of her feet. Per chert review, an xray obtained on 11/13 revealed a right 2nd digit proximal phalanx fracture. PMHx includes: DM Type II, A-Fib, and Meningioma.  Clinical Impression  Pt is a pleasant 85 year old female who was admitted for LE edema. Pt received in recliner and performs transfers with mod I and ambulation with supervision using RW. Pt demonstrates deficits with strength/endurance/mobility. Pt is close to baseline level, however does report she has deficits she wishes to work on.  Would benefit from skilled PT to address above deficits and promote optimal return to PLOF.     Follow Up Recommendations Outpatient PT    Equipment Recommendations  None recommended by PT    Recommendations for Other Services       Precautions / Restrictions Precautions Precautions: Fall Restrictions Weight Bearing Restrictions: No      Mobility  Bed Mobility               General bed mobility comments: deferred as pt received seated in chair  Transfers Overall transfer level: Modified independent Equipment used: Rolling walker (2 wheeled)             General transfer comment: safe technique with pt pushing from seated surface. Once standing, forward flexed posture noted  Ambulation/Gait Ambulation/Gait assistance: Supervision Gait Distance (Feet): 20 Feet Assistive device: Rolling walker (2 wheeled) Gait Pattern/deviations: Step-through pattern     General Gait Details: ambulated using RW to bathroom for urgent needs. Safe technique without LOB noted.   Stairs            Wheelchair Mobility    Modified Rankin (Stroke Patients  Only)       Balance Overall balance assessment: Needs assistance Sitting-balance support: Feet supported Sitting balance-Leahy Scale: Good     Standing balance support: Bilateral upper extremity supported Standing balance-Leahy Scale: Good                               Pertinent Vitals/Pain Pain Assessment: Faces Faces Pain Scale: Hurts a little bit Pain Location: B plantar feet Pain Descriptors / Indicators: Discomfort Pain Intervention(s): Limited activity within patient's tolerance    Home Living Family/patient expects to be discharged to:: Private residence Living Arrangements: Alone   Type of Home: Independent living facility       Home Layout: One level Home Equipment: Walker - 4 wheels;Grab bars - tub/shower;Shower seat;Bedside commode      Prior Function Level of Independence: Independent with assistive device(s)         Comments: used rollater for all mobility     Hand Dominance        Extremity/Trunk Assessment   Upper Extremity Assessment Upper Extremity Assessment: Overall WFL for tasks assessed    Lower Extremity Assessment Lower Extremity Assessment: Generalized weakness(B LE grossly 4/5 observed with functional movt)       Communication   Communication: No difficulties  Cognition Arousal/Alertness: Awake/alert Behavior During Therapy: WFL for tasks assessed/performed Overall Cognitive Status: Within Functional Limits for tasks assessed  General Comments      Exercises Other Exercises Other Exercises: ambulated to bathroom with supervision. Safe technique with transfer on/off toilet.    Assessment/Plan    PT Assessment Patient needs continued PT services  PT Problem List Decreased strength;Decreased balance;Decreased mobility;Pain;Decreased knowledge of use of DME       PT Treatment Interventions Gait training;DME instruction;Therapeutic exercise;Balance  training    PT Goals (Current goals can be found in the Care Plan section)  Acute Rehab PT Goals Patient Stated Goal: To be able to walk aagain PT Goal Formulation: With patient Time For Goal Achievement: 04/14/19 Potential to Achieve Goals: Good    Frequency Min 2X/week   Barriers to discharge        Co-evaluation               AM-PAC PT "6 Clicks" Mobility  Outcome Measure Help needed turning from your back to your side while in a flat bed without using bedrails?: None Help needed moving from lying on your back to sitting on the side of a flat bed without using bedrails?: None Help needed moving to and from a bed to a chair (including a wheelchair)?: A Little Help needed standing up from a chair using your arms (e.g., wheelchair or bedside chair)?: A Little Help needed to walk in hospital room?: A Little Help needed climbing 3-5 steps with a railing? : A Little 6 Click Score: 20    End of Session Equipment Utilized During Treatment: Gait belt Activity Tolerance: Patient tolerated treatment well Patient left: (bathroom, RN aware) Nurse Communication: Mobility status PT Visit Diagnosis: Muscle weakness (generalized) (M62.81);Difficulty in walking, not elsewhere classified (R26.2);Pain Pain - Right/Left: (bilat feet) Pain - part of body: Ankle and joints of foot    Time: 1345-1405 PT Time Calculation (min) (ACUTE ONLY): 20 min   Charges:   PT Evaluation $PT Eval Low Complexity: 1 Low PT Treatments $Therapeutic Activity: 8-22 mins        Greggory Stallion, PT, DPT [phone]   [name] 03/31/2019, 4:24 PM

## 2019-03-31 NOTE — Progress Notes (Signed)
Pharmacy Antibiotic Note  [name] is a 85 y.o. female admitted on 03/29/2019 with LE swelling cellulitis w/ AKIPharmacy has been consulted for vanc/cefepime dosing. Patient received vanc 1.75g IV load and cefepime 2g IV x 1 and is currently in AKI w/ current Scr of 1.97 (baseline 1.1 2 months ago).  Plan: 11/19 @ 0500 VR: 12 mcg/mL. Will start following regimen:  Vancomycin 1000 mg IV Q 36 hrs. Goal AUC 400-550. Expected AUC: 530.4 SCr used: 1.34 Cssmin: 13.0  Will continue to monitor s/sx of infx and renal function and adjust as needed.  Height: 5\' 7"  (170.2 cm) Weight: 193 lb (87.5 kg) IBW/kg (Calculated) : 61.6  Temp (24hrs), Avg:97.9 F (36.6 C), Min:97.6 F (36.4 C), Max:98.2 F (36.8 C)  Recent Labs  Lab 03/29/19 1758 03/29/19 1917 03/30/19 0540 03/30/19 0852 03/31/19 0432  WBC 10.4  --  7.4  --  7.0  CREATININE 1.97*  --  1.72*  --  1.34*  LATICACIDVEN  --  3.2*  --  1.1  --   VANCORANDOM  --   --   --   --  12    Estimated Creatinine Clearance: 34.3 mL/min (A) (by C-G formula based on SCr of 1.34 mg/dL (H)).    Allergies  Allergen Reactions  . Alendronate Other (See Comments)    Cramping of the jaw  . Iodinated Diagnostic Agents Other (See Comments)    Brother has allergy and she wants not to receive it   . Statins Other (See Comments)    Fatigue/muscle weakness Fatigue/muscle weakness Fatigue/muscle weakness    Thank you for allowing pharmacy to be a part of this patient's care.  Tobie Lords, PharmD, BCPS Clinical Pharmacist 03/31/2019 5:59 AM

## 2019-03-31 NOTE — Evaluation (Addendum)
Occupational Therapy Evaluation Patient Details Name: [name] MRN: [id number] DOB: [birth year] Today's Date: 03/31/2019    History of Present Illness Pt. is an 85 y.o. female who was admitted to [facility] with LE edema, cellulitis, and worsening renal function. Pt. sustained an injury 10 days ago when a heavy object fell on her both of her feet. Per chert review, an xray obtained on 11/13 revealed a right 2nd digit proximal phalanx fracture. PMHx includes: DM Type II, A-Fib, and Meningioma.   Clinical Impression   Pt. presents with limited functional mobility. Pt. has no pain at rest reporting that it only hurts during weightbearing. Pt. resides at home alone in an independent villa at [facility]. Pt. was independent with ADLs, and IADL functioning: including: meal preparation, and medication management. Pt. reports that she does not access the shower since this most recent injury secondary to a fear of falling. Pt. Reports that she is able to problem solve through performing LE ADL tasks. Pt. has a reacher, grab bars, shower chair, BSCommode, a 4 wheeled walker, and has declined any additional A/E needs at this time. Pt. declined need for additional, or follow-up OT services as pt. Reports being able to problem-solve through ADL needs/tasks. No further OT services are warranted at this time. Pt. Is in agreement. Pt. Could benefit a home aide to assist with household tasks upon return home. Will Complete the OT order.    Follow Up Recommendations  No OT follow up    Equipment Recommendations       Recommendations for Other Services       Precautions / Restrictions Precautions Precautions: None Restrictions Weight Bearing Restrictions: No      Mobility Bed Mobility               General bed mobility comments: Deferred  Transfers                 General transfer comment: Deferred    Balance                                           ADL either  performed or assessed with clinical judgement   ADL   Eating/Feeding: Set up;Independent   Grooming: Set up;Independent   Upper Body Bathing: Set up;Independent       Upper Body Dressing : Set up;Independent     Lower Body Dressing Details (indicate cue type and reason): Pt. reports being able to problem solve through donning pants, shoes, and socks independently               General ADL Comments: Pt. has a reacher.     Vision Baseline Vision/History: Wears glasses Wears Glasses: Distance only;Reading only(Driving)       Perception     Praxis      Pertinent Vitals/Pain Pain Assessment: No/denies pain     Hand Dominance Right   Extremity/Trunk Assessment Upper Extremity Assessment Upper Extremity Assessment: Overall WFL for tasks assessed           Communication Communication Communication: No difficulties   Cognition Arousal/Alertness: Awake/alert Behavior During Therapy: WFL for tasks assessed/performed Overall Cognitive Status: Within Functional Limits for tasks assessed  General Comments       Exercises     Shoulder Instructions      Home Living Family/patient expects to be discharged to:: Private residence Living Arrangements: Alone   Type of Home: Independent living facility([facility]) Home Access: Stairs to enter     [place]: One level     Bathroom Shower/Tub: Walk-in shower;Curtain         [place]: Environmental consultant - 4 wheels;Grab bars - tub/shower;Shower seat;Bedside commode          Prior Functioning/Environment Level of Independence: Independent        Comments: Pt. was independent  with ADLs/IADLs        OT Problem List:        OT Treatment/Interventions:      OT Goals(Current goals can be found in the care plan section) Acute Rehab OT Goals Patient Stated Goal: To be able to walk aagain OT Goal Formulation: With patient Potential to Achieve Goals:  Good  OT Frequency:     Barriers to D/C:            Co-evaluation              AM-PAC OT "6 Clicks" Daily Activity     Outcome Measure Help from another person eating meals?: None Help from another person taking care of personal grooming?: None Help from another person toileting, which includes using toliet, bedpan, or urinal?: A Little Help from another person bathing (including washing, rinsing, drying)?: A Little Help from another person to put on and taking off regular upper body clothing?: None Help from another person to put on and taking off regular lower body clothing?: None 6 Click Score: 22   End of Session    Activity Tolerance: Patient tolerated treatment well Patient left: in bed;with call bell/phone within reach;with bed alarm set  OT Visit Diagnosis: History of falling (Z91.81)                Time: [id number] OT Time Calculation (min): 20 min Charges:  OT General Charges $OT Visit: 1 Visit OT Evaluation $OT Eval Low Complexity: 1 Low  Harrel Carina, MS, OTR/L  Harrel Carina 03/31/2019, 12:22 PM

## 2019-03-31 NOTE — Progress Notes (Signed)
PROGRESS NOTE    [name]  NAT:[id number] DOB: [birth year] DOA: 03/29/2019 PCP: Rusty Aus, MD    Brief Narrative:  [name] is a 85 y.o. female with history of diabetes mellitus type 2, chronic diastolic dysfunction, hypertension had an injury to have both feet when and heavy object fell onto it about 10 days ago.  The feet started swelling become erythematous with wound and had followed up with her primary care physician who prescribed antibiotics.  Despite taking which patient's wound has been getting worse.  Labs were done which showed worsening renal function and patient was instructed to come to the ER.  Patient states about 2 to 3 weeks ago patient did have some diarrhea but nothing further from that.  Denies any vomiting or abdominal pain chest pain or shortness of breath.  X-rays done of the feet on November 13 showed swelling of the right foot with second toe proximal phalanx fracture.  ED Course: In the ER labs reveal creatinine 1.9 with a lactate of 3.2 creatinine is increased from baseline of around 1.1 in September 2020.  Other labs show WBC of 10.4 globin 12.3 platelets 350.  COVID-19 test is pending blood cultures were obtained started on empiric antibiotics fluids and admitted for cellulitis of the both lower extremity with acute renal failure.  Interim History: -11/18: ABI: showed severe right lower extremity arterial occlusive disease at rest. Left ABI may be artifactually elevated due to vascular noncompressibility, underestimating degree of disease. -11/19: consulted [facility], Dr. Delana Meyer, pending recommendation   Subjective:  still has severe pain in both feet (the right foot is worse than the left). No fever or chills. No CP, SOB, no nausea, vomiting, diarrhea or abdominal pain.  Assessment & Plan:   Principal Problem:   Cellulitis of both lower extremities Active Problems:   Essential (primary) hypertension   Persistent atrial fibrillation (HCC)  Diabetes mellitus type 2, controlled, with complications (HCC)   ARF (acute renal failure) (HCC)   Chronic diastolic CHF (congestive heart failure) (HCC)   PVD (peripheral vascular disease) (HCC)   Cellulitis of both lower extremities: patient has failed outpatient oral antibiotics for cellulitis. Note that patient's x-rays did show second toe proximal phalanx fracture. ABI showed severe right lower extremity arterial occlusive disease at rest. Left ABI may be artifactually elevated due to vascular noncompressibility, underestimating degree of disease. CRp  0.9 and ESR 35.  -will continue vanco and cefepime -Follow-up blood culture (so far no growth)  PVD: as shown by ABI. -consulted [facility]VS, pending recommendation  AKI: could be from prerenal cause check UA FENa -hold diuretics ARB - gently hydrate, NS 75 cc/h - Follow intake output.  Lab Results  Component Value Date   CREATININE 1.34 (H) 03/31/2019   CREATININE 1.72 (H) 03/30/2019   CREATININE 1.97 (H) 03/29/2019   Hypertension: - holding ARB due to renal failure.  - Continue beta-blockers. -  As needed IV hydralazine  History of A. fib mention in the chart.  - Presently only on aspirin and beta-blocker.  Diabetes mellitus type 2, controlled, with complications: Hemoglobin A1c was 6.32 months ago. -Levemir 5 unit in AM and 10 Units PM -SSI   DVT prophylaxis: Heparin. Code Status: Full code. Family Communication: Discussed with patient. Disposition Plan: Home. Consults called: Wound team. Admission status: Inpatient.    DVT prophylaxis: sq heparin Code Status: full Family Communication: no, family not at bedside Disposition Plan: inpt. Pt still has severe foot pain. ABI showed severe right  lower extremity arterial occlusive disease at rest, need further work up and VVS consult pending    Consultants:   none  Procedures:    Antimicrobials:  Anti-infectives (From admission, onward)   Start     Dose/Rate  Route Frequency Ordered Stop   03/31/19 1200  ceFEPIme (MAXIPIME) 2 g in sodium chloride 0.9 % 100 mL IVPB     2 g 200 mL/hr over 30 Minutes Intravenous Every 12 hours 03/31/19 0955     03/31/19 0600  vancomycin (VANCOCIN) IVPB 1000 mg/200 mL premix     1,000 mg 200 mL/hr over 60 Minutes Intravenous Every 36 hours 03/31/19 0558     03/30/19 2000  ceFEPIme (MAXIPIME) 2 g in sodium chloride 0.9 % 100 mL IVPB  Status:  Discontinued     2 g 200 mL/hr over 30 Minutes Intravenous Every 24 hours 03/30/19 0124 03/31/19 0955   03/29/19 2145  vancomycin (VANCOCIN) 1,750 mg in sodium chloride 0.9 % 500 mL IVPB     1,750 mg 250 mL/hr over 120 Minutes Intravenous  Once 03/29/19 2143 03/30/19 0035   03/29/19 1915  ceFEPIme (MAXIPIME) 2 g in sodium chloride 0.9 % 100 mL IVPB     2 g 200 mL/hr over 30 Minutes Intravenous  Once 03/29/19 1901 03/29/19 2001   03/29/19 1915  vancomycin (VANCOCIN) IVPB 1000 mg/200 mL premix  Status:  Discontinued     1,000 mg 200 mL/hr over 60 Minutes Intravenous  Once 03/29/19 1901 03/29/19 2143        Objective: Vitals:   03/31/19 0500 03/31/19 0904 03/31/19 1210 03/31/19 1530  BP:  138/70 (!) 154/70 (!) 152/65  Pulse:  68 62 68  Resp:  '14 17 16  ' Temp:  97.7 F (36.5 C) 98.2 F (36.8 C) 97.9 F (36.6 C)  TempSrc:  Oral Oral Oral  SpO2:  100% 100% 99%  Weight: 87.7 kg     Height:        Intake/Output Summary (Last 24 hours) at 03/31/2019 1632 Last data filed at 03/31/2019 1036 Gross per 24 hour  Intake 372.56 ml  Output -  Net 372.56 ml   Filed Weights   03/29/19 1732 03/31/19 0500  Weight: 87.5 kg 87.7 kg    Examination:  Physical Exam:  General: Not in acute distress HEENT: PERRL, EOMI, no scleral icterus, No JVD or bruit Cardiac: S1/S2, RRR, gallops or rubs Pulm: Clear to auscultation bilaterally. No rales, wheezing, rhonchi or rubs. Abd: Soft, nondistended, nontender, no rebound pain, no organomegaly, BS present Ext: has bilateral foot  swelling, erythema and tenderness, scabbed wounds in both feet. Musculoskeletal: No joint deformities, erythema, or stiffness, ROM full Skin: No rashes.  Neuro: Alert and oriented X3, cranial nerves II-XII grossly intact, moves all extremeties normally.  Psych: Patient is not psychotic, no suicidal or hemocidal ideation.    Data Reviewed: I have personally reviewed following labs and imaging studies  CBC: Recent Labs  Lab 03/29/19 1758 03/30/19 0540 03/31/19 0432  WBC 10.4 7.4 7.0  HGB 12.3 10.5* 12.3  HCT 37.3 31.9* 36.1  MCV 98.9 100.0 96.0  PLT 350 292 163   Basic Metabolic Panel: Recent Labs  Lab 03/29/19 1758 03/30/19 0540 03/31/19 0432  NA 137 138 141  K 4.2 4.6 4.3  CL 99 99 105  CO2 21* 23 23  GLUCOSE 135* 174* 99  BUN 85* 76* 53*  CREATININE 1.97* 1.72* 1.34*  CALCIUM 9.9 8.9 9.7   GFR: Estimated Creatinine Clearance: 34.3  mL/min (A) (by C-G formula based on SCr of 1.34 mg/dL (H)). Liver Function Tests: No results for input(s): AST, ALT, ALKPHOS, BILITOT, PROT, ALBUMIN in the last 168 hours. No results for input(s): LIPASE, AMYLASE in the last 168 hours. No results for input(s): AMMONIA in the last 168 hours. Coagulation Profile: No results for input(s): INR, PROTIME in the last 168 hours. Cardiac Enzymes: No results for input(s): CKTOTAL, CKMB, CKMBINDEX, TROPONINI in the last 168 hours. BNP (last 3 results) No results for input(s): PROBNP in the last 8760 hours. HbA1C: No results for input(s): HGBA1C in the last 72 hours. CBG: Recent Labs  Lab 03/30/19 1142 03/30/19 1727 03/30/19 2107 03/31/19 0755 03/31/19 1158  GLUCAP 119* 148* 201* 110* 133*   Lipid Profile: No results for input(s): CHOL, HDL, LDLCALC, TRIG, CHOLHDL, LDLDIRECT in the last 72 hours. Thyroid Function Tests: No results for input(s): TSH, T4TOTAL, FREET4, T3FREE, THYROIDAB in the last 72 hours. Anemia Panel: No results for input(s): VITAMINB12, FOLATE, FERRITIN, TIBC, IRON,  RETICCTPCT in the last 72 hours. Sepsis Labs: Recent Labs  Lab 03/29/19 1917 03/30/19 0852  LATICACIDVEN 3.2* 1.1    Recent Results (from the past 240 hour(s))  Blood Culture (routine x 2)     Status: None (Preliminary result)   Collection Time: 03/29/19  7:06 PM   Specimen: BLOOD  Result Value Ref Range Status   Specimen Description BLOOD RIGHT ANTECUBITAL  Final   Special Requests   Final    BOTTLES DRAWN AEROBIC AND ANAEROBIC Blood Culture adequate volume   Culture   Final    NO GROWTH 2 DAYS Performed at [facility], [address] [place], [place] [address]    Report Status PENDING  Incomplete  Blood Culture (routine x 2)     Status: None (Preliminary result)   Collection Time: 03/29/19  7:17 PM   Specimen: BLOOD  Result Value Ref Range Status   Specimen Description BLOOD BLOOD RIGHT ARM  Final   Special Requests   Final    BOTTLES DRAWN AEROBIC AND ANAEROBIC Blood Culture adequate volume   Culture   Final    NO GROWTH 2 DAYS Performed at [facility], [address] [place], [place] [address]    Report Status PENDING  Incomplete  SARS CORONAVIRUS 2 (TAT 6-24 HRS) Nasopharyngeal Nasopharyngeal Swab     Status: None   Collection Time: 03/29/19  7:17 PM   Specimen: Nasopharyngeal Swab  Result Value Ref Range Status   SARS Coronavirus 2 NEGATIVE NEGATIVE Final    Comment: (NOTE) SARS-CoV-2 target nucleic acids are NOT DETECTED. The SARS-CoV-2 RNA is generally detectable in upper and lower respiratory specimens during the acute phase of infection. Negative results do not preclude SARS-CoV-2 infection, do not rule out co-infections with other pathogens, and should not be used as the sole basis for treatment or other patient management decisions. Negative results must be combined with clinical observations, patient history, and epidemiological information. The expected result is Negative. Fact Sheet for Patients:  SugarRoll.be Fact Sheet for Healthcare Providers: https://www.woods-mathews.com/ This test is not yet approved or cleared by the [place] FDA and  has been authorized for detection and/or diagnosis of SARS-CoV-2 by FDA under an Emergency Use Authorization (EUA). This EUA will remain  in effect (meaning this test can be used) for the duration of the COVID-19 declaration under Section 56 4(b)(1) of the Act, 21 U.S.C. section 360bbb-3(b)(1), unless the authorization is terminated or revoked sooner. Performed at [facility]  [facility], [place] [address], [place], [place] [address]      Radiology Studies: US Arterial Burnard Bunting (screening Lower Extremity)  Result Date: 03/30/2019 CLINICAL DATA:  Bilateral vascular ulcers, skin color change. Hypertension, diabetes. EXAM: NONINVASIVE PHYSIOLOGIC VASCULAR STUDY OF BILATERAL LOWER EXTREMITIES TECHNIQUE: Evaluation of both lower extremities were performed at rest, including calculation of ankle-brachial indices with single level Doppler, pressure and pulse volume recording. COMPARISON:  None. FINDINGS: Right ABI:  0.48 Left ABI:  1.19 Right Lower Extremity:  Monophasic arterial waveforms at the ankle. Left Lower Extremity:  Biphasic arterial waveforms at the ankle. IMPRESSION: Severe right lower extremity arterial occlusive disease at rest. Left ABI may be artifactually elevated due to vascular noncompressibility, underestimating degree of disease. Electronically Signed   By: Lucrezia Europe M.D.   On: 03/30/2019 12:18        Scheduled Meds: . aspirin  325 mg Oral BID  . docusate sodium  100 mg Oral BID  . heparin  5,000 Units Subcutaneous Q8H  . insulin aspart  0-9 Units Subcutaneous TID WC  . insulin detemir  10 Units Subcutaneous QHS  . insulin detemir  5 Units Subcutaneous q morning - 10a  . metoprolol tartrate  12.5 mg Oral BID   Continuous Infusions: . sodium chloride 1,000 mL (03/31/19 0642)  . ceFEPime  (MAXIPIME) IV 2 g (03/31/19 1224)  . vancomycin 1,000 mg (03/31/19 0643)     LOS: 2 days    Time spent: 30 min     Ivor Costa, DO [facility] PAGER is on [place]  If 7PM-7AM, please contact night-coverage [email or web address] Password [email or web address] 03/31/2019, 4:32 PM

## 2019-04-01 DIAGNOSIS — I70234 Atherosclerosis of native arteries of right leg with ulceration of heel and midfoot: Secondary | ICD-10-CM

## 2019-04-01 DIAGNOSIS — I70244 Atherosclerosis of native arteries of left leg with ulceration of heel and midfoot: Secondary | ICD-10-CM

## 2019-04-01 DIAGNOSIS — E875 Hyperkalemia: Secondary | ICD-10-CM | POA: Diagnosis present

## 2019-04-01 DIAGNOSIS — I4891 Unspecified atrial fibrillation: Secondary | ICD-10-CM

## 2019-04-01 LAB — CBC
HCT: 33.8 % — ABNORMAL LOW (ref 36.0–46.0)
Hemoglobin: 10.9 g/dL — ABNORMAL LOW (ref 12.0–15.0)
MCH: 32.4 pg (ref 26.0–34.0)
MCHC: 32.2 g/dL (ref 30.0–36.0)
MCV: 100.6 fL — ABNORMAL HIGH (ref 80.0–100.0)
Platelets: 295 10*3/uL (ref 150–400)
RBC: 3.36 MIL/uL — ABNORMAL LOW (ref 3.87–5.11)
RDW: 14.3 % (ref 11.5–15.5)
WBC: 7.4 10*3/uL (ref 4.0–10.5)
nRBC: 0 % (ref 0.0–0.2)

## 2019-04-01 LAB — BASIC METABOLIC PANEL
Anion gap: 10 (ref 5–15)
BUN: 47 mg/dL — ABNORMAL HIGH (ref 8–23)
CO2: 25 mmol/L (ref 22–32)
Calcium: 9.3 mg/dL (ref 8.9–10.3)
Chloride: 106 mmol/L (ref 98–111)
Creatinine, Ser: 1.17 mg/dL — ABNORMAL HIGH (ref 0.44–1.00)
GFR calc Af Amer: 49 mL/min — ABNORMAL LOW (ref >60)
GFR calc non Af Amer: 42 mL/min — ABNORMAL LOW (ref >60)
Glucose, Bld: 109 mg/dL — ABNORMAL HIGH (ref 70–99)
Potassium: 5.2 mmol/L — ABNORMAL HIGH (ref 3.5–5.1)
Sodium: 141 mmol/L (ref 135–145)

## 2019-04-01 LAB — GLUCOSE, CAPILLARY
Glucose-Capillary: 100 mg/dL — ABNORMAL HIGH (ref 70–99)
Glucose-Capillary: 163 mg/dL — ABNORMAL HIGH (ref 70–99)
Glucose-Capillary: 116 mg/dL — ABNORMAL HIGH (ref 70–99)
Glucose-Capillary: 211 mg/dL — ABNORMAL HIGH (ref 70–99)

## 2019-04-01 LAB — VANCOMYCIN, RANDOM: Vancomycin Rm: 13

## 2019-04-01 MED ORDER — VANCOMYCIN HCL IN DEXTROSE 750-5 MG/150ML-% IV SOLN
750.0000 mg | INTRAVENOUS | Status: DC
  Administered 2019-04-01 – 2019-04-02 (×2): 750 mg via INTRAVENOUS
  Filled 2019-04-01 (×3): qty 150

## 2019-04-01 MED ORDER — SODIUM ZIRCONIUM CYCLOSILICATE 10 G PO PACK
10.0000 g | PACK | Freq: Once | ORAL | Status: DC
  Filled 2019-04-01: qty 1

## 2019-04-01 MED ORDER — SODIUM CHLORIDE 0.9 % IV SOLN
INTRAVENOUS | Status: DC
  Administered 2019-04-01 – 2019-04-02 (×2): via INTRAVENOUS

## 2019-04-01 NOTE — Progress Notes (Signed)
Notified Dr. Blaine Hamper that patient is refusing her insulin because she does not take it at home. She also refused her Lokelma for her potassium, she said that it may bring her potassium down to low and that she wanted the doctor to come and evaluate her. No new orders given.

## 2019-04-01 NOTE — Progress Notes (Signed)
[facility]  Daily Progress Note   Subjective  - * No surgery found *  The patient continues to have pain in both of her feet.  She has been thinking about the angiogram which we discussed yesterday evening and would like to have further imaging before agreeing to an angiogram and has questioned whether a CT scan would be appropriate.  She is been refusing her Lokelma and would rather change to a different antibiotic if that is what is causing her potassium to increase.  Objective Vitals:   04/01/19 0500 04/01/19 0514 04/01/19 0930 04/01/19 1155  BP:  (!) 157/67 132/72 (!) 150/91  Pulse:  (!) 58 (!) 52 64  Resp:  16  18  Temp:  97.7 F (36.5 C)  98.1 F (36.7 C)  TempSrc:  Oral  Oral  SpO2:  98%  100%  Weight: 88 kg     Height:        Intake/Output Summary (Last 24 hours) at 04/01/2019 1801 Last data filed at 04/01/2019 1558 Gross per 24 hour  Intake 859.03 ml  Output 800 ml  Net 59.03 ml    PULM  Normal effort , no use of accessory muscles CV  No JVD, RRR Abd      No distended, nontender VASC  pedal pulses are nonpalpable.  Wounds are undressed on the dorsum of the right foot she has 3 ulcers in a transverse pattern across the top of the foot the lateral and middle ones are full-thickness and appear about the size of a dime the most medial 1 is full-thickness and appears to be approximately the size of a nickel.  On the left foot there is a superficial almost more of an abrasion or skin tear.  There is 3+ edema on the dorsum of both feet and I suspect this is in part swelling from the trauma and in part some hematoma.  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  The good news today is that her GFR is now up to 40.  I do not see any urgency and in fact feel that waiting a few more days to allow her kidney function to recover would be the most appropriate course of action.  Nevertheless I do not think she will heal her wounds of the right foot without revascularization.  I believe the left foot appears to be significantly more superficial and her perfusion is better on that side as well both of which would say the left foot should heal without vascular intervention.    She seems to be fixated on a CT scan but does not appear to grasp that the scan requires contrast  and that there is no potential for intervention and therefore she would still require an angiogram.  Nevertheless, if she insists on a CAT scan we could obtain a CT angiogram which would allow for more clear understanding of her occlusive disease I would then delay intervention until we are certain her kidney function did not deteriorate after the contrast exposure.    She also raised the fact that she would like her doctor to come see her and participate in the care she was informed that since there is now a hospitalist service internal medicine doctor is no longer make rounds in the hospital she seemed quite vexed by this but then said she does not see him but once every 3 years and then commented she sees her diabetic doctor at [facility] more often than her regular doctor but would never expect her doctor from [facility] to come here to see her.  It was a somewhat confusing discussion.    Lastly, attempts at  replacing her IV were discontinued at the patient's insistence after a single attempt with a 22-gauge.  I would strongly consider having the PICC service assessor if she will continue her antibiotics for more than another day or 2.    [name]  04/01/2019, 6:01 PM

## 2019-04-01 NOTE — Progress Notes (Signed)
PROGRESS NOTE    [name]  AJL:[id number] DOB: 02-02-1931 DOA: 03/29/2019 PCP: Rusty Aus, MD    Brief Narrative:  [name] is a 85 y.o. female with history of diabetes mellitus type 2, chronic diastolic dysfunction, hypertension had an injury to have both feet when and heavy object fell onto it about 10 days ago.  The feet started swelling become erythematous with wound and had followed up with her primary care physician who prescribed antibiotics.  Despite taking which patient's wound has been getting worse.  Labs were done which showed worsening renal function and patient was instructed to come to the ER.  Patient states about 2 to 3 weeks ago patient did have some diarrhea but nothing further from that.  Denies any vomiting or abdominal pain chest pain or shortness of breath.  X-rays done of the feet on November 13 showed swelling of the right foot with second toe proximal phalanx fracture.  ED Course: In the ER labs reveal creatinine 1.9 with a lactate of 3.2 creatinine is increased from baseline of around 1.1 in September 2020.  Other labs show WBC of 10.4 globin 12.3 platelets 350.  COVID-19 test is pending blood cultures were obtained started on empiric antibiotics fluids and admitted for cellulitis of the both lower extremity with acute renal failure.  Interim History: -11/18: ABI: showed severe right lower extremity arterial occlusive disease at rest. Left ABI may be artifactually elevated due to vascular noncompressibility, underestimating degree of disease. -11/19: consulted [facility], Dr. Delana Meyer, pending recommendation -11/20: k 5.2. pt refused Lokelma  Subjective: Patient reports that her foot pain has improved than yesterday. No fever or chills. No CP, SOB, no nausea, vomiting, diarrhea or abdominal pain.  Assessment & Plan:   Principal Problem:   Cellulitis of both lower extremities Active Problems:   Essential (primary) hypertension   Persistent atrial  fibrillation (HCC)   Diabetes mellitus type 2, controlled, with complications (HCC)   ARF (acute renal failure) (HCC)   Chronic diastolic CHF (congestive heart failure) (HCC)   PVD (peripheral vascular disease) (HCC)   Hyperkalemia   Cellulitis of both lower extremities: patient has failed outpatient oral antibiotics for cellulitis. Note that patient's x-rays did show second toe proximal phalanx fracture. ABI showed severe right lower extremity arterial occlusive disease at rest. Left ABI may be artifactually elevated due to vascular noncompressibility, underestimating degree of disease. CRP  0.9 and ESR 35.  -will continue vanco and cefepime -Follow-up blood culture (so far no growth)  PVD: as shown by ABI. -consulted [facility], planing intervention, need more discuss with pt, possible CTA needed  AKI: improving  Lab Results  Component Value Date   CREATININE 1.17 (H) 04/01/2019   CREATININE 1.34 (H) 03/31/2019   CREATININE 1.72 (H) 03/30/2019   Hypertension: - holding ARB due to renal failure.  - Continue beta-blockers. -  As needed IV hydralazine  History of A. fib mention in the chart.  - Presently only on aspirin and beta-blocker.  Diabetes mellitus type 2, controlled, with complications: Hemoglobin A1c was 6.32 months ago. -Levemir 5 unit in AM and 10 Units PM -SSI  Hyperkalemia:  K 5.2.  -Lokelma was ordered, but the patient refused it -will give gentle IVF 75 cc/h -recheck BMP in am   DVT prophylaxis: sq heparin Code Status: full Family Communication: talked to pt's daughter-in-law on the phone Disposition Plan: inpt. Pt still has severe foot pain. ABI showed severe right lower extremity arterial occlusive disease at rest,  need further work up and [facility] consult pending   Consultants:   none  Procedures:    Antimicrobials:  Anti-infectives (From admission, onward)   Start     Dose/Rate Route Frequency Ordered Stop   04/01/19 1400  vancomycin (VANCOCIN) IVPB  750 mg/150 ml premix     750 mg 150 mL/hr over 60 Minutes Intravenous Every 24 hours 04/01/19 1219     03/31/19 1200  ceFEPIme (MAXIPIME) 2 g in sodium chloride 0.9 % 100 mL IVPB     2 g 200 mL/hr over 30 Minutes Intravenous Every 12 hours 03/31/19 0955     03/31/19 0600  vancomycin (VANCOCIN) IVPB 1000 mg/200 mL premix  Status:  Discontinued     1,000 mg 200 mL/hr over 60 Minutes Intravenous Every 36 hours 03/31/19 0558 04/01/19 1219   03/30/19 2000  ceFEPIme (MAXIPIME) 2 g in sodium chloride 0.9 % 100 mL IVPB  Status:  Discontinued     2 g 200 mL/hr over 30 Minutes Intravenous Every 24 hours 03/30/19 0124 03/31/19 0955   03/29/19 2145  vancomycin (VANCOCIN) 1,750 mg in sodium chloride 0.9 % 500 mL IVPB     1,750 mg 250 mL/hr over 120 Minutes Intravenous  Once 03/29/19 2143 03/30/19 0035   03/29/19 1915  ceFEPIme (MAXIPIME) 2 g in sodium chloride 0.9 % 100 mL IVPB     2 g 200 mL/hr over 30 Minutes Intravenous  Once 03/29/19 1901 03/29/19 2001   03/29/19 1915  vancomycin (VANCOCIN) IVPB 1000 mg/200 mL premix  Status:  Discontinued     1,000 mg 200 mL/hr over 60 Minutes Intravenous  Once 03/29/19 1901 03/29/19 2143        Objective: Vitals:   04/01/19 0500 04/01/19 0514 04/01/19 0930 04/01/19 1155  BP:  (!) 157/67 132/72 (!) 150/91  Pulse:  (!) 58 (!) 52 64  Resp:  16  18  Temp:  97.7 F (36.5 C)  98.1 F (36.7 C)  TempSrc:  Oral  Oral  SpO2:  98%  100%  Weight: 88 kg     Height:        Intake/Output Summary (Last 24 hours) at 04/01/2019 1928 Last data filed at 04/01/2019 1558 Gross per 24 hour  Intake 859.03 ml  Output 800 ml  Net 59.03 ml   Filed Weights   03/29/19 1732 03/31/19 0500 04/01/19 0500  Weight: 87.5 kg 87.7 kg 88 kg    Examination:  Physical Exam:  General: Not in acute distress HEENT: PERRL, EOMI, no scleral icterus, No JVD or bruit Cardiac: S1/S2, RRR, gallops or rubs Pulm: Clear to auscultation bilaterally. No rales, wheezing, rhonchi or  rubs. Abd: Soft, nondistended, nontender, no rebound pain, no organomegaly, BS present Ext: has bilateral foot swelling, erythema and tenderness, scabbed wounds in both feet. Musculoskeletal: No joint deformities, erythema, or stiffness, ROM full Skin: No rashes.  Neuro: Alert and oriented X3, cranial nerves II-XII grossly intact, moves all extremeties normally.  Psych: Patient is not psychotic, no suicidal or hemocidal ideation.    Data Reviewed: I have personally reviewed following labs and imaging studies  CBC: Recent Labs  Lab 03/29/19 1758 03/30/19 0540 03/31/19 0432 04/01/19 0641  WBC 10.4 7.4 7.0 7.4  HGB 12.3 10.5* 12.3 10.9*  HCT 37.3 31.9* 36.1 33.8*  MCV 98.9 100.0 96.0 100.6*  PLT 350 292 331 993   Basic Metabolic Panel: Recent Labs  Lab 03/29/19 1758 03/30/19 0540 03/31/19 0432 04/01/19 0641  NA 137 138 141 141  K 4.2 4.6 4.3 5.2*  CL 99 99 105 106  CO2 21* '23 23 25  ' GLUCOSE 135* 174* 99 109*  BUN 85* 76* 53* 47*  CREATININE 1.97* 1.72* 1.34* 1.17*  CALCIUM 9.9 8.9 9.7 9.3   GFR: Estimated Creatinine Clearance: 39.3 mL/min (A) (by C-G formula based on SCr of 1.17 mg/dL (H)). Liver Function Tests: No results for input(s): AST, ALT, ALKPHOS, BILITOT, PROT, ALBUMIN in the last 168 hours. No results for input(s): LIPASE, AMYLASE in the last 168 hours. No results for input(s): AMMONIA in the last 168 hours. Coagulation Profile: No results for input(s): INR, PROTIME in the last 168 hours. Cardiac Enzymes: No results for input(s): CKTOTAL, CKMB, CKMBINDEX, TROPONINI in the last 168 hours. BNP (last 3 results) No results for input(s): PROBNP in the last 8760 hours. HbA1C: No results for input(s): HGBA1C in the last 72 hours. CBG: Recent Labs  Lab 03/31/19 1700 03/31/19 2101 04/01/19 0751 04/01/19 1153 04/01/19 1651  GLUCAP 145* 152* 100* 163* 116*   Lipid Profile: No results for input(s): CHOL, HDL, LDLCALC, TRIG, CHOLHDL, LDLDIRECT in the last  72 hours. Thyroid Function Tests: No results for input(s): TSH, T4TOTAL, FREET4, T3FREE, THYROIDAB in the last 72 hours. Anemia Panel: No results for input(s): VITAMINB12, FOLATE, FERRITIN, TIBC, IRON, RETICCTPCT in the last 72 hours. Sepsis Labs: Recent Labs  Lab 03/29/19 1917 03/30/19 0852  LATICACIDVEN 3.2* 1.1    Recent Results (from the past 240 hour(s))  Blood Culture (routine x 2)     Status: None (Preliminary result)   Collection Time: 03/29/19  7:06 PM   Specimen: BLOOD  Result Value Ref Range Status   Specimen Description BLOOD RIGHT ANTECUBITAL  Final   Special Requests   Final    BOTTLES DRAWN AEROBIC AND ANAEROBIC Blood Culture adequate volume   Culture   Final    NO GROWTH 3 DAYS Performed at [facility], [address] [place], [place] [address]    Report Status PENDING  Incomplete  Blood Culture (routine x 2)     Status: None (Preliminary result)   Collection Time: 03/29/19  7:17 PM   Specimen: BLOOD  Result Value Ref Range Status   Specimen Description BLOOD BLOOD RIGHT ARM  Final   Special Requests   Final    BOTTLES DRAWN AEROBIC AND ANAEROBIC Blood Culture adequate volume   Culture   Final    NO GROWTH 3 DAYS Performed at [facility], [address], [place], [place] [address]    Report Status PENDING  Incomplete  SARS CORONAVIRUS 2 (TAT 6-24 HRS) Nasopharyngeal Nasopharyngeal Swab     Status: None   Collection Time: 03/29/19  7:17 PM   Specimen: Nasopharyngeal Swab  Result Value Ref Range Status   SARS Coronavirus 2 NEGATIVE NEGATIVE Final    Comment: (NOTE) SARS-CoV-2 target nucleic acids are NOT DETECTED. The SARS-CoV-2 RNA is generally detectable in upper and lower respiratory specimens during the acute phase of infection. Negative results do not preclude SARS-CoV-2 infection, do not rule out co-infections with other pathogens, and should not be used as the sole basis for treatment or other patient management decisions.  Negative results must be combined with clinical observations, patient history, and epidemiological information. The expected result is Negative. Fact Sheet for Patients: SugarRoll.be Fact Sheet for Healthcare Providers: https://www.woods-mathews.com/ This test is not yet approved or cleared by the [place] FDA and  has been authorized for detection and/or diagnosis of SARS-CoV-2 by FDA under  an Emergency Use Authorization (EUA). This EUA will remain  in effect (meaning this test can be used) for the duration of the COVID-19 declaration under Section 56 4(b)(1) of the Act, 21 U.S.C. section 360bbb-3(b)(1), unless the authorization is terminated or revoked sooner. Performed at [facility], [place] [address], [place],  [address]      Radiology Studies: No results found.      Scheduled Meds: . aspirin  325 mg Oral BID  . docusate sodium  100 mg Oral BID  . heparin  5,000 Units Subcutaneous Q8H  . insulin aspart  0-9 Units Subcutaneous TID WC  . insulin detemir  10 Units Subcutaneous QHS  . insulin detemir  5 Units Subcutaneous q morning - 10a  . metoprolol tartrate  12.5 mg Oral BID  . sodium zirconium cyclosilicate  10 g Oral Once   Continuous Infusions: . sodium chloride Stopped (04/01/19 1503)  . ceFEPime (MAXIPIME) IV Stopped (04/01/19 1137)  . vancomycin 750 mg (04/01/19 1508)     LOS: 3 days    Time spent: 30 min     Ivor Costa, DO [facility] PAGER is on [place]  If 7PM-7AM, please contact night-coverage [email or web address] Password [facility] 04/01/2019, 7:28 PM

## 2019-04-01 NOTE — Progress Notes (Signed)
Pharmacy Antibiotic Note  [name] is a 85 y.o. female admitted on 03/29/2019 with LE swelling cellulitis. Pharmacy was consulted for vancomycin and cefepime dosing. Approximately 10 to 14 days ago she sustained bilateral foot trauma after dropping a heavy walker across both her feet.  Subsequently she developed ulcerations associated with cellulitis. Dr Delana Meyer has noted that she likely will require angiography and revascularization She was originally admitted with AKI, which is much improved. SCr today is 1.17 mg/dL (baseline ~1.1), This is day #4 of IV antibiotics  Vancomycin doses 11/17 2235 1750mg  11/19 0643 1000mg   Vancomycin levels (random) 11/19 0452 12 mcg/mL 11/20 0641 13 mcg/mL   Plan: 1) start vancomycin 750 mg IV Q 24 hrs Goal AUC 400-550 BMI 30.4 Expected AUC: 528 SCr used: 1.17  Css: 31.6/15.1 mcg/mL T1/2: 21.5h  2) continue cefepime 2gm IV q12h   Height: 5\' 7"  (170.2 cm) Weight: 193 lb 14.4 oz (88 kg) IBW/kg (Calculated) : 61.6  Temp (24hrs), Avg:98 F (36.7 C), Min:97.7 F (36.5 C), Max:98.6 F (37 C)  Recent Labs  Lab 03/29/19 1758 03/29/19 1917 03/30/19 0540 03/30/19 0852 03/31/19 0432 04/01/19 0641  WBC 10.4  --  7.4  --  7.0 7.4  CREATININE 1.97*  --  1.72*  --  1.34* 1.17*  LATICACIDVEN  --  3.2*  --  1.1  --   --   VANCORANDOM  --   --   --   --  12  --     Estimated Creatinine Clearance: 39.3 mL/min (A) (by C-G formula based on SCr of 1.17 mg/dL (H)).    Antimicrobials this admission: vancomycin 11/17 >>  cefepime 11/17 >>   Microbiology results: 11/17 BCx: NG 11/17 SARS CoV-2: negative   Thank you for allowing pharmacy to be a part of this patient's care.  Vallery Sa, PharmD Clinical Pharmacist 04/01/2019

## 2019-04-01 NOTE — Care Management Important Message (Signed)
Important Message  Patient Details  Name: [name] MRN: [id number] Date of Birth: October 24, 1930   Medicare Important Message Given:  Yes  Initial Medicare IM given by Patient Access Associate on 03/31/2019 at 10:04am.  Still valid.     Dannette Barbara 04/01/2019, 8:22 AM

## 2019-04-02 DIAGNOSIS — L03115 Cellulitis of right lower limb: Secondary | ICD-10-CM

## 2019-04-02 LAB — BASIC METABOLIC PANEL
Anion gap: 8 (ref 5–15)
BUN: 36 mg/dL — ABNORMAL HIGH (ref 8–23)
CO2: 25 mmol/L (ref 22–32)
Calcium: 9.1 mg/dL (ref 8.9–10.3)
Chloride: 106 mmol/L (ref 98–111)
Creatinine, Ser: 1.01 mg/dL — ABNORMAL HIGH (ref 0.44–1.00)
GFR calc Af Amer: 58 mL/min — ABNORMAL LOW (ref >60)
GFR calc non Af Amer: 50 mL/min — ABNORMAL LOW (ref >60)
Glucose, Bld: 107 mg/dL — ABNORMAL HIGH (ref 70–99)
Potassium: 4.5 mmol/L (ref 3.5–5.1)
Sodium: 139 mmol/L (ref 135–145)

## 2019-04-02 LAB — GLUCOSE, CAPILLARY
Glucose-Capillary: 90 mg/dL (ref 70–99)
Glucose-Capillary: 164 mg/dL — ABNORMAL HIGH (ref 70–99)
Glucose-Capillary: 253 mg/dL — ABNORMAL HIGH (ref 70–99)
Glucose-Capillary: 148 mg/dL — ABNORMAL HIGH (ref 70–99)

## 2019-04-02 LAB — MAGNESIUM: Magnesium: 1.9 mg/dL (ref 1.7–2.4)

## 2019-04-02 LAB — CBC
HCT: 32.5 % — ABNORMAL LOW (ref 36.0–46.0)
Hemoglobin: 11.2 g/dL — ABNORMAL LOW (ref 12.0–15.0)
MCH: 33 pg (ref 26.0–34.0)
MCHC: 34.5 g/dL (ref 30.0–36.0)
MCV: 95.9 fL (ref 80.0–100.0)
Platelets: 286 10*3/uL (ref 150–400)
RBC: 3.39 MIL/uL — ABNORMAL LOW (ref 3.87–5.11)
RDW: 14.5 % (ref 11.5–15.5)
WBC: 7.5 10*3/uL (ref 4.0–10.5)
nRBC: 0 % (ref 0.0–0.2)

## 2019-04-02 LAB — VANCOMYCIN, RANDOM: Vancomycin Rm: 17

## 2019-04-02 MED ORDER — TRIAMCINOLONE 0.1 % CREAM:EUCERIN CREAM 1:1
TOPICAL_CREAM | Freq: Two times a day (BID) | CUTANEOUS | Status: DC | PRN
  Filled 2019-04-02: qty 1

## 2019-04-02 MED ORDER — SODIUM CHLORIDE 0.9 % IV SOLN
1.0000 g | INTRAVENOUS | Status: DC
  Administered 2019-04-03 – 2019-04-06 (×4): 1 g via INTRAVENOUS
  Filled 2019-04-02: qty 1
  Filled 2019-04-02: qty 10
  Filled 2019-04-02 (×3): qty 1

## 2019-04-02 MED ORDER — SENNOSIDES-DOCUSATE SODIUM 8.6-50 MG PO TABS
1.0000 | ORAL_TABLET | Freq: Two times a day (BID) | ORAL | Status: DC
  Filled 2019-04-02 (×12): qty 1

## 2019-04-02 MED ORDER — POLYETHYLENE GLYCOL 3350 17 G PO PACK
17.0000 g | PACK | Freq: Every day | ORAL | Status: DC | PRN
  Filled 2019-04-02: qty 1

## 2019-04-02 NOTE — Progress Notes (Addendum)
PROGRESS NOTE    [name]  KWI:[id number] DOB: Dec 09, 1930 DOA: 03/29/2019 PCP: Rusty Aus, MD    Brief Narrative:  [name] is a 85 y.o. female with history of diabetes mellitus type 2, chronic diastolic dysfunction, hypertension had an injury to have both feet when and heavy object fell onto it about 10 days ago.  The feet started swelling become erythematous with wound and had followed up with her primary care physician who prescribed antibiotics.  Despite taking which patient's wound has been getting worse.  Labs were done which showed worsening renal function and patient was instructed to come to the ER.  Patient states about 2 to 3 weeks ago patient did have some diarrhea but nothing further from that.  Denies any vomiting or abdominal pain chest pain or shortness of breath.  X-rays done of the feet on November 13 showed swelling of the right foot with second toe proximal phalanx fracture.  ED Course: In the ER labs reveal creatinine 1.9 with a lactate of 3.2 creatinine is increased from baseline of around 1.1 in September 2020.  Other labs show WBC of 10.4 globin 12.3 platelets 350.  COVID-19 test is pending blood cultures were obtained started on empiric antibiotics fluids and admitted for cellulitis of the both lower extremity with acute renal failure.  Interim History: -11/18: ABI: showed severe right lower extremity arterial occlusive disease at rest. Left ABI may be artifactually elevated due to vascular noncompressibility, underestimating degree of disease. -11/19: consulted [facility], Dr. Delana Meyer, pending recommendation -11/20: k 5.2. pt refused Lokelma -->11/21 K 4.5 with IVF treatment. D/C'ed IVF -11/21: narrow down antibiotics from vancomycin and cefepime to Rocephin  Subjective: Patient still has severe bilateral foot pain, which is constant, sharp and worse upon slight touch. No fever or chills. No CP, SOB, no nausea, vomiting, diarrhea or abdominal pain.  Assessment  & Plan:   Principal Problem:   Cellulitis of both lower extremities Active Problems:   Essential (primary) hypertension   Persistent atrial fibrillation (HCC)   Diabetes mellitus type 2, controlled, with complications (HCC)   ARF (acute renal failure) (HCC)   Chronic diastolic CHF (congestive heart failure) (HCC)   PVD (peripheral vascular disease) (HCC)   Hyperkalemia   Cellulitis of foot, right   Cellulitis of both lower extremities: patient has failed outpatient oral antibiotics for cellulitis. Note that patient's x-rays did show second toe proximal phalanx fracture. ABI showed severe right lower extremity arterial occlusive disease at rest. Left ABI may be artifactually elevated due to vascular noncompressibility, underestimating degree of disease. CRP  0.9 and ESR 35.   -will changed vanco and cefepime to rocephin 11/21, will continue rocephin  -Follow-up blood culture (so far no growth) -wound care on borad  PVD: as shown by ABI. VVS has discussed several times about possible intervention, but pt still cannot make final decision. -consulted [facility], planing intervention, need more discuss with pt, possible CTA needed  AKI: improving  Lab Results  Component Value Date   CREATININE 0.89 04/04/2019   CREATININE 0.91 04/03/2019   CREATININE 1.01 (H) 04/02/2019   Hypertension: - holding ARB due to renal failure.  - Continue beta-blockers. -  As needed IV hydralazine  History of A. fib mention in the chart.  - Presently only on aspirin and beta-blocker.  Diabetes mellitus type 2, controlled, with complications: Hemoglobin A1c was 6.32 months ago. -Levemir 5 unit in AM and 10 Units PM -SSI  Hyperkalemia: resolved. K 4.2 -BMP in  am   DVT prophylaxis: sq heparin Code Status: full Family Communication:  Disposition Plan: inpt. Pt still has severe foot pain. ABI showed severe right lower extremity arterial occlusive disease at rest, need further work up and VVS consult  pending   Consultants:   none  Procedures:    Antimicrobials:  Anti-infectives (From admission, onward)   Start     Dose/Rate Route Frequency Ordered Stop   04/03/19 0900  cefTRIAXone (ROCEPHIN) 1 g in sodium chloride 0.9 % 100 mL IVPB     1 g 200 mL/hr over 30 Minutes Intravenous Every 24 hours 04/02/19 1709     04/01/19 1400  vancomycin (VANCOCIN) IVPB 750 mg/150 ml premix  Status:  Discontinued     750 mg 150 mL/hr over 60 Minutes Intravenous Every 24 hours 04/01/19 1219 04/02/19 1730   03/31/19 1200  ceFEPIme (MAXIPIME) 2 g in sodium chloride 0.9 % 100 mL IVPB  Status:  Discontinued     2 g 200 mL/hr over 30 Minutes Intravenous Every 12 hours 03/31/19 0955 04/02/19 1708   03/31/19 0600  vancomycin (VANCOCIN) IVPB 1000 mg/200 mL premix  Status:  Discontinued     1,000 mg 200 mL/hr over 60 Minutes Intravenous Every 36 hours 03/31/19 0558 04/01/19 1219   03/30/19 2000  ceFEPIme (MAXIPIME) 2 g in sodium chloride 0.9 % 100 mL IVPB  Status:  Discontinued     2 g 200 mL/hr over 30 Minutes Intravenous Every 24 hours 03/30/19 0124 03/31/19 0955   03/29/19 2145  vancomycin (VANCOCIN) 1,750 mg in sodium chloride 0.9 % 500 mL IVPB     1,750 mg 250 mL/hr over 120 Minutes Intravenous  Once 03/29/19 2143 03/30/19 0035   03/29/19 1915  ceFEPIme (MAXIPIME) 2 g in sodium chloride 0.9 % 100 mL IVPB     2 g 200 mL/hr over 30 Minutes Intravenous  Once 03/29/19 1901 03/29/19 2001   03/29/19 1915  vancomycin (VANCOCIN) IVPB 1000 mg/200 mL premix  Status:  Discontinued     1,000 mg 200 mL/hr over 60 Minutes Intravenous  Once 03/29/19 1901 03/29/19 2143        Objective: Vitals:   04/04/19 0517 04/04/19 1009 04/04/19 1151 04/04/19 2029  BP: 134/66 (!) 156/83 (!) 152/66 (!) 120/102  Pulse: (!) 58 68 66 61  Resp: 18   18  Temp: 98.6 F (37 C)  97.6 F (36.4 C) 98.3 F (36.8 C)  TempSrc: Oral  Oral Oral  SpO2: 98% 100% 99% 100%  Weight:      Height:        Intake/Output Summary  (Last 24 hours) at 04/04/2019 2128 Last data filed at 04/04/2019 1600 Gross per 24 hour  Intake 100 ml  Output -  Net 100 ml   Filed Weights   03/31/19 0500 04/01/19 0500 04/02/19 0500  Weight: 87.7 kg 88 kg 88.9 kg    Examination:  Physical Exam:  General: Not in acute distress HEENT: PERRL, EOMI, no scleral icterus, No JVD or bruit Cardiac: S1/S2, RRR, gallops or rubs Pulm: Clear to auscultation bilaterally. No rales, wheezing, rhonchi or rubs. Abd: Soft, nondistended, nontender, no rebound pain, no organomegaly, BS present Ext: has bilateral foot swelling, erythema and tenderness, scabbed wounds with black blisters in both feet. Musculoskeletal: No joint deformities, erythema, or stiffness, ROM full Skin: No rashes.  Neuro: Alert and oriented X3, cranial nerves II-XII grossly intact, moves all extremeties normally.  Psych: Patient is not psychotic, no suicidal or hemocidal ideation.  Data Reviewed: I have personally reviewed following labs and imaging studies  CBC: Recent Labs  Lab 03/31/19 0432 04/01/19 0641 04/02/19 0634 04/03/19 0453 04/04/19 0346  WBC 7.0 7.4 7.5 7.2 7.2  HGB 12.3 10.9* 11.2* 10.6* 10.1*  HCT 36.1 33.8* 32.5* 31.0* 30.1*  MCV 96.0 100.6* 95.9 96.0 98.7  PLT 331 295 286 281 102   Basic Metabolic Panel: Recent Labs  Lab 03/31/19 0432 04/01/19 0641 04/02/19 0634 04/03/19 0453 04/04/19 0346  NA 141 141 139 138 140  K 4.3 5.2* 4.5 4.2 4.6  CL 105 106 106 106 110  CO2 _0 GLUCOSE 99 109* 107* 120* 152*  BUN 53* 47* 36* 29* 29*  CREATININE 1.34* 1.17* 1.01* 0.91 0.89  CALCIUM 9.7 9.3 9.1 9.0 9.1  MG  --   --  1.9  --   --    GFR: Estimated Creatinine Clearance: 51.9 mL/min (by C-G formula based on SCr of 0.89 mg/dL). Liver Function Tests: No results for input(s): AST, ALT, ALKPHOS, BILITOT, PROT, ALBUMIN in the last 168 hours. No results for input(s): LIPASE, AMYLASE in the last 168 hours. No results for input(s):  AMMONIA in the last 168 hours. Coagulation Profile: No results for input(s): INR, PROTIME in the last 168 hours. Cardiac Enzymes: No results for input(s): CKTOTAL, CKMB, CKMBINDEX, TROPONINI in the last 168 hours. BNP (last 3 results) No results for input(s): PROBNP in the last 8760 hours. HbA1C: No results for input(s): HGBA1C in the last 72 hours. CBG: Recent Labs  Lab 04/03/19 1653 04/03/19 2150 04/04/19 0734 04/04/19 1149 04/04/19 1639  GLUCAP 212* 320* 119* 173* 195*   Lipid Profile: No results for input(s): CHOL, HDL, LDLCALC, TRIG, CHOLHDL, LDLDIRECT in the last 72 hours. Thyroid Function Tests: No results for input(s): TSH, T4TOTAL, FREET4, T3FREE, THYROIDAB in the last 72 hours. Anemia Panel: No results for input(s): VITAMINB12, FOLATE, FERRITIN, TIBC, IRON, RETICCTPCT in the last 72 hours. Sepsis Labs: Recent Labs  Lab 03/29/19 1917 03/30/19 0852  LATICACIDVEN 3.2* 1.1    Recent Results (from the past 240 hour(s))  Blood Culture (routine x 2)     Status: None   Collection Time: 03/29/19  7:06 PM   Specimen: BLOOD  Result Value Ref Range Status   Specimen Description BLOOD RIGHT ANTECUBITAL  Final   Special Requests   Final    BOTTLES DRAWN AEROBIC AND ANAEROBIC Blood Culture adequate volume   Culture   Final    NO GROWTH 5 DAYS Performed at [facility], [address], [place], [place] [address]    Report Status 04/03/2019 FINAL  Final  Blood Culture (routine x 2)     Status: None   Collection Time: 03/29/19  7:17 PM   Specimen: BLOOD  Result Value Ref Range Status   Specimen Description BLOOD BLOOD RIGHT ARM  Final   Special Requests   Final    BOTTLES DRAWN AEROBIC AND ANAEROBIC Blood Culture adequate volume   Culture   Final    NO GROWTH 5 DAYS Performed at [facility], [address] [place], [place] [address]    Report Status 04/03/2019 FINAL  Final  SARS CORONAVIRUS 2 (TAT 6-24 HRS) Nasopharyngeal Nasopharyngeal Swab      Status: None   Collection Time: 03/29/19  7:17 PM   Specimen: Nasopharyngeal Swab  Result Value Ref Range Status   SARS Coronavirus 2 NEGATIVE NEGATIVE Final    Comment: (NOTE) SARS-CoV-2 target nucleic acids are  NOT DETECTED. The SARS-CoV-2 RNA is generally detectable in upper and lower respiratory specimens during the acute phase of infection. Negative results do not preclude SARS-CoV-2 infection, do not rule out co-infections with other pathogens, and should not be used as the sole basis for treatment or other patient management decisions. Negative results must be combined with clinical observations, patient history, and epidemiological information. The expected result is Negative. Fact Sheet for Patients: SugarRoll.be Fact Sheet for Healthcare Providers: https://www.woods-mathews.com/ This test is not yet approved or cleared by the [place] FDA and  has been authorized for detection and/or diagnosis of SARS-CoV-2 by FDA under an Emergency Use Authorization (EUA). This EUA will remain  in effect (meaning this test can be used) for the duration of the COVID-19 declaration under Section 56 4(b)(1) of the Act, 21 U.S.C. section 360bbb-3(b)(1), unless the authorization is terminated or revoked sooner. Performed at [facility], [place] [address], [place], [place] [address]      Radiology Studies: No results found.      Scheduled Meds: . aspirin  325 mg Oral BID  . docusate sodium  100 mg Oral BID  . heparin  5,000 Units Subcutaneous Q8H  . insulin aspart  0-9 Units Subcutaneous TID WC  . insulin detemir  10 Units Subcutaneous QHS  . insulin detemir  5 Units Subcutaneous q morning - 10a  . metoprolol tartrate  12.5 mg Oral BID  . senna-docusate  1 tablet Oral BID  . sodium zirconium cyclosilicate  10 g Oral Once   Continuous Infusions: . sodium chloride Stopped (04/01/19 1503)  . cefTRIAXone (ROCEPHIN)  IV Stopped  (04/04/19 1046)     LOS: 6 days    Time spent: 30 min     Ivor Costa, DO [facility] PAGER is on [place]  If 7PM-7AM, please contact night-coverage [email or web address] Password [facility] 04/04/2019, 9:28 PM

## 2019-04-03 LAB — GLUCOSE, CAPILLARY
Glucose-Capillary: 113 mg/dL — ABNORMAL HIGH (ref 70–99)
Glucose-Capillary: 155 mg/dL — ABNORMAL HIGH (ref 70–99)
Glucose-Capillary: 212 mg/dL — ABNORMAL HIGH (ref 70–99)
Glucose-Capillary: 320 mg/dL — ABNORMAL HIGH (ref 70–99)

## 2019-04-03 LAB — BASIC METABOLIC PANEL
Anion gap: 8 (ref 5–15)
BUN: 29 mg/dL — ABNORMAL HIGH (ref 8–23)
CO2: 24 mmol/L (ref 22–32)
Calcium: 9 mg/dL (ref 8.9–10.3)
Chloride: 106 mmol/L (ref 98–111)
Creatinine, Ser: 0.91 mg/dL (ref 0.44–1.00)
GFR calc Af Amer: >60 mL/min (ref >60)
GFR calc non Af Amer: 57 mL/min — ABNORMAL LOW (ref >60)
Glucose, Bld: 120 mg/dL — ABNORMAL HIGH (ref 70–99)
Potassium: 4.2 mmol/L (ref 3.5–5.1)
Sodium: 138 mmol/L (ref 135–145)

## 2019-04-03 LAB — CBC
HCT: 31 % — ABNORMAL LOW (ref 36.0–46.0)
Hemoglobin: 10.6 g/dL — ABNORMAL LOW (ref 12.0–15.0)
MCH: 32.8 pg (ref 26.0–34.0)
MCHC: 34.2 g/dL (ref 30.0–36.0)
MCV: 96 fL (ref 80.0–100.0)
Platelets: 281 10*3/uL (ref 150–400)
RBC: 3.23 MIL/uL — ABNORMAL LOW (ref 3.87–5.11)
RDW: 14.5 % (ref 11.5–15.5)
WBC: 7.2 10*3/uL (ref 4.0–10.5)
nRBC: 0 % (ref 0.0–0.2)

## 2019-04-03 LAB — CULTURE, BLOOD (ROUTINE X 2)
Culture: NO GROWTH
Culture: NO GROWTH
Special Requests: ADEQUATE
Special Requests: ADEQUATE

## 2019-04-03 NOTE — Plan of Care (Signed)
Patient very anxious and worried in regards to her wounds. Although nursing continues to reinforce education and provide reassurance, patient still prefers physician to evaluate.   Fuller Mandril, RN

## 2019-04-03 NOTE — Consult Note (Addendum)
[facility] Nurse Consult Note: Reason for Consult: bilateral feet, R>L Wound type:Trauma, PAD  Discussed with Dr. Blaine Hamper today via [place].  Patient was seen by my partner on 11/18 and by Vascular Surgeon, Dr. Delana Meyer on 11/19. In his note he indicated that the wounds were not likely to heal without angioplasty. He continues to discuss his recommendation for this procedure with the patient. She is interested in having a CT scan.  There are conservative wound care orders on the chart for topical care using xeroform gauze that were provided by my partner and these are still appropriate today. Prevention from pressure injury in the patient with diminished blood flow to the LEs is important. I have added the order to float heels for this purpose and to assist with edema. Wound measurements are unchanged from initial assessment:  Right dorsal foot: 1.0cm x 4.0cm x 0.2cm (total area, three distinct wounds, largest at medial, full thickness) Left dorsal foot: 1cm x 1.5cm, no depth (absorbing blood filled bullae) Right lateral great toe: 0.5cm x 0.5cm, no depth (eschar)  At this time, there is no role for continuing care by [place].  [place] nursing team will not follow, but will remain available to this patient, the nursing and medical teams.  Please re-consult if needed. Thanks, Maudie Flakes, MSN, RN, [place], Arther Abbott  Pager# [phone]

## 2019-04-04 DIAGNOSIS — I70234 Atherosclerosis of native arteries of right leg with ulceration of heel and midfoot: Secondary | ICD-10-CM

## 2019-04-04 LAB — CBC
HCT: 30.1 % — ABNORMAL LOW (ref 36.0–46.0)
Hemoglobin: 10.1 g/dL — ABNORMAL LOW (ref 12.0–15.0)
MCH: 33.1 pg (ref 26.0–34.0)
MCHC: 33.6 g/dL (ref 30.0–36.0)
MCV: 98.7 fL (ref 80.0–100.0)
Platelets: 274 10*3/uL (ref 150–400)
RBC: 3.05 MIL/uL — ABNORMAL LOW (ref 3.87–5.11)
RDW: 14.6 % (ref 11.5–15.5)
WBC: 7.2 10*3/uL (ref 4.0–10.5)
nRBC: 0 % (ref 0.0–0.2)

## 2019-04-04 LAB — GLUCOSE, CAPILLARY
Glucose-Capillary: 119 mg/dL — ABNORMAL HIGH (ref 70–99)
Glucose-Capillary: 173 mg/dL — ABNORMAL HIGH (ref 70–99)
Glucose-Capillary: 195 mg/dL — ABNORMAL HIGH (ref 70–99)
Glucose-Capillary: 271 mg/dL — ABNORMAL HIGH (ref 70–99)

## 2019-04-04 LAB — BASIC METABOLIC PANEL
Anion gap: 7 (ref 5–15)
BUN: 29 mg/dL — ABNORMAL HIGH (ref 8–23)
CO2: 23 mmol/L (ref 22–32)
Calcium: 9.1 mg/dL (ref 8.9–10.3)
Chloride: 110 mmol/L (ref 98–111)
Creatinine, Ser: 0.89 mg/dL (ref 0.44–1.00)
GFR calc Af Amer: >60 mL/min (ref >60)
GFR calc non Af Amer: 59 mL/min — ABNORMAL LOW (ref >60)
Glucose, Bld: 152 mg/dL — ABNORMAL HIGH (ref 70–99)
Potassium: 4.6 mmol/L (ref 3.5–5.1)
Sodium: 140 mmol/L (ref 135–145)

## 2019-04-04 NOTE — Care Management Important Message (Signed)
Important Message  Patient Details  Name: [name] MRN: [id number] Date of Birth: August 23, 1930   Medicare Important Message Given:  Yes     Dannette Barbara 04/04/2019, 10:44 AM

## 2019-04-04 NOTE — Progress Notes (Signed)
PT Cancellation Note  Patient Details Name: [name] MRN: [id number] DOB: [birth year]   Cancelled Treatment:    Reason Eval/Treat Not Completed: Other (comment). Treatment attempted, however pt reports she would rather just rest as she is feeling sluggish. Refused treatment at this time.   Lyndell Gillyard 04/04/2019, 3:35 PM  Greggory Stallion, PT, DPT [phone]

## 2019-04-04 NOTE — Progress Notes (Addendum)
[facility]  Daily Progress Note   Subjective  -  Foot remains painful when she walks and by her description she is having significant with ambulation  Objective Vitals:   04/03/19 2033 04/04/19 0517 04/04/19 1009 04/04/19 1151  BP: (!) 142/78 134/66 (!) 156/83 (!) 152/66  Pulse: 65 (!) 58 68 66  Resp: 20 18    Temp: 98 F (36.7 C) 98.6 F (37 C)  97.6 F (36.4 C)  TempSrc: Oral Oral  Oral  SpO2: 98% 98% 100% 99%  Weight:      Height:        Intake/Output Summary (Last 24 hours) at 04/04/2019 1820 Last data filed at 04/04/2019 1600 Gross per 24 hour  Intake 100 ml  Output -  Net 100 ml    PULM  Normal effort , no use of accessory muscles CV  No JVD Abd      none distended, no guarding VASC  edema and erythema of the ankle is now essentially resolved  Laboratory CBC    Component Value Date/Time   WBC 7.2 04/04/2019 0346   HGB 10.1 (L) 04/04/2019 0346   HCT 30.1 (L) 04/04/2019 0346   PLT 274 04/04/2019 0346    BMET    Component Value Date/Time   NA 140 04/04/2019 0346   NA 136 (A) 01/12/2019   K 4.6 04/04/2019 0346   CL 110 04/04/2019 0346   CO2 23 04/04/2019 0346   GLUCOSE 152 (H) 04/04/2019 0346   BUN 29 (H) 04/04/2019 0346   BUN 30 (A) 01/12/2019   CREATININE 0.89 04/04/2019 0346   CALCIUM 9.1 04/04/2019 0346   GFRNONAA 59 (L) 04/04/2019 0346   GFRAA >60 04/04/2019 0346    Assessment/Planning:   Atherosclerotic occlusive disease bilateral lower extremities with ulcerations of the right forefoot:    I again had discussion regarding the need for angiography.  We discussed that her kidney function has now returned to normal.  She does not wish to address the topic of angiography.  She is focusing more on antibiotic therapy and its duration as well as wound care and discharge planning.    She is of the opinion that she will have multiple choices and options for her posthospitalization care.  She is expressed  disappointment in the care given at [facility] in particular with respect to her brother.  She repeatedly stated how much better the medical care is in [place].    At this point I have restated the difficulty in healing wounds with significant atherosclerotic occlusive disease.  Since she does not wish to commit to angiography at this point I have offered that she follow-up with me in the office and we can always address possible angiography with intervention as an outpatient.   A total of 30 minutes was spent with this patient and greater than 50% was spent in counseling and coordination of care with the patient.  Discussion included the treatment options for vascular disease including indications for surgery and intervention.  Also discussed is the appropriate timing of treatment.  In addition medical therapy was discussed.    [name]  04/04/2019, 6:20 PM

## 2019-04-05 LAB — BASIC METABOLIC PANEL
Anion gap: 5 (ref 5–15)
BUN: 25 mg/dL — ABNORMAL HIGH (ref 8–23)
CO2: 26 mmol/L (ref 22–32)
Calcium: 9.3 mg/dL (ref 8.9–10.3)
Chloride: 107 mmol/L (ref 98–111)
Creatinine, Ser: 0.87 mg/dL (ref 0.44–1.00)
GFR calc Af Amer: >60 mL/min (ref >60)
GFR calc non Af Amer: >60 mL/min (ref >60)
Glucose, Bld: 137 mg/dL — ABNORMAL HIGH (ref 70–99)
Potassium: 5.7 mmol/L — ABNORMAL HIGH (ref 3.5–5.1)
Sodium: 138 mmol/L (ref 135–145)

## 2019-04-05 LAB — CBC
HCT: 30.5 % — ABNORMAL LOW (ref 36.0–46.0)
Hemoglobin: 10.5 g/dL — ABNORMAL LOW (ref 12.0–15.0)
MCH: 32.9 pg (ref 26.0–34.0)
MCHC: 34.4 g/dL (ref 30.0–36.0)
MCV: 95.6 fL (ref 80.0–100.0)
Platelets: 272 10*3/uL (ref 150–400)
RBC: 3.19 MIL/uL — ABNORMAL LOW (ref 3.87–5.11)
RDW: 14.4 % (ref 11.5–15.5)
WBC: 7.8 10*3/uL (ref 4.0–10.5)
nRBC: 0 % (ref 0.0–0.2)

## 2019-04-05 LAB — GLUCOSE, CAPILLARY
Glucose-Capillary: 103 mg/dL — ABNORMAL HIGH (ref 70–99)
Glucose-Capillary: 188 mg/dL — ABNORMAL HIGH (ref 70–99)
Glucose-Capillary: 157 mg/dL — ABNORMAL HIGH (ref 70–99)
Glucose-Capillary: 266 mg/dL — ABNORMAL HIGH (ref 70–99)

## 2019-04-05 MED ORDER — INSULIN ASPART 100 UNIT/ML IV SOLN: 10.0000 [IU] | Freq: Once | INTRAVENOUS | Status: DC

## 2019-04-05 MED ORDER — SODIUM ZIRCONIUM CYCLOSILICATE 10 G PO PACK
10.0000 g | PACK | Freq: Once | ORAL | Status: AC
  Administered 2019-04-05: 10 g via ORAL
  Filled 2019-04-05: qty 1

## 2019-04-05 MED ORDER — DEXTROSE 50 % IV SOLN
1.0000 | Freq: Once | INTRAVENOUS | Status: DC
  Filled 2019-04-05: qty 50

## 2019-04-05 MED ORDER — CALCIUM GLUCONATE-NACL 1-0.675 GM/50ML-% IV SOLN
1.0000 g | Freq: Once | INTRAVENOUS | Status: AC
  Administered 2019-04-05: 1000 mg via INTRAVENOUS
  Filled 2019-04-05: qty 50

## 2019-04-05 MED ORDER — HYDRALAZINE HCL 50 MG PO TABS
50.0000 mg | ORAL_TABLET | Freq: Three times a day (TID) | ORAL | Status: DC | PRN
  Administered 2019-04-05 – 2019-04-07 (×2): 50 mg via ORAL
  Filled 2019-04-05 (×2): qty 1

## 2019-04-05 MED ORDER — SODIUM CHLORIDE 0.9 % IV SOLN
INTRAVENOUS | Status: DC
  Administered 2019-04-05 (×2): via INTRAVENOUS

## 2019-04-05 NOTE — Progress Notes (Addendum)
MD notified: The patient refuses to take [facility] at this time would like to speak to you before taking it. She was educated on the lab values and reason for lokalma yet still refuses. Refuses calcium gluconate at this time as well and MD was notified.

## 2019-04-05 NOTE — Progress Notes (Signed)
OT Cancellation Note  Patient Details Name: [name] MRN: [id number] DOB: 05-Jul-1930   Cancelled Treatment:    Reason Eval/Treat Not Completed: Medical issues which prohibited therapy. Consult received, chart reviewed. Of note, pt evaluated by OT during this admission on 03/31/19 and signed off. Pt noted with K+ of 5.7 this date and refusing medication to address hyperkalemia. Pt K+ falling outside guidelines for participation in therapy at this time. Will continue to follow and re-attempt as appropriate to see whether a significant change has taken place to warrant additional skilled OT needs.   Jeni Salles, MPH, MS, OTR/L ascom [phone] 04/05/19, 8:23 AM

## 2019-04-05 NOTE — Progress Notes (Signed)
PT Cancellation Note  Patient Details Name: [name] MRN: [id number] DOB: Jul 28, 1930   Cancelled Treatment:    Reason Eval/Treat Not Completed: Other (comment). Pt with 5.7 K+ at this time, not appropriate for PT intervention. Noted 3 active/new PT consults in chart. Pt has been on caseload and already evaluated. Treatment attempted yesterday, however pt refused due to feeling sluggish. Will re-attempt next date.   Scotlynn Noyes 04/05/2019, 10:32 AM  Greggory Stallion, PT, DPT [phone]

## 2019-04-05 NOTE — Progress Notes (Signed)
PROGRESS NOTE    [name]  PRX:[id number] DOB: 28-May-1930 DOA: 03/29/2019 PCP: Rusty Aus, MD    Brief Narrative:  [name] is a 85 y.o. female with history of diabetes mellitus type 2, chronic diastolic dysfunction, hypertension had an injury to have both feet when and heavy object fell onto it about 10 days ago.  The feet started swelling become erythematous with wound and had followed up with her primary care physician who prescribed antibiotics.  Despite taking which patient's wound has been getting worse.  Labs were done which showed worsening renal function and patient was instructed to come to the ER.  Patient states about 2 to 3 weeks ago patient did have some diarrhea but nothing further from that.  Denies any vomiting or abdominal pain chest pain or shortness of breath.  X-rays done of the feet on November 13 showed swelling of the right foot with second toe proximal phalanx fracture.  ED Course: In the ER labs reveal creatinine 1.9 with a lactate of 3.2 creatinine is increased from baseline of around 1.1 in September 2020.  Other labs show WBC of 10.4 globin 12.3 platelets 350.  COVID-19 test is pending blood cultures were obtained started on empiric antibiotics fluids and admitted for cellulitis of the both lower extremity with acute renal failure.  Interim History: -11/18: ABI: showed severe right lower extremity arterial occlusive disease at rest. Left ABI may be artifactually elevated due to vascular noncompressibility, underestimating degree of disease. -11/19: consulted [facility], Dr. Delana Meyer, pending recommendation -11/20: k 5.2. pt refused Lokelma -->11/21 K 4.5 with IVF treatment. D/C'ed IVF -11/21: narrow down antibiotics from vancomycin and cefepime to Rocephin - 11/24: K 5.7, 10 g of Lokelma is given  Subjective: Patient still has severe bilateral foot pain, which is constant, sharp and worse upon slight touch. No fever or chills. No CP, SOB, no nausea, vomiting,  diarrhea or abdominal pain.  Assessment & Plan:   Principal Problem:   Cellulitis of both lower extremities Active Problems:   Essential (primary) hypertension   Persistent atrial fibrillation (HCC)   Diabetes mellitus type 2, controlled, with complications (HCC)   ARF (acute renal failure) (HCC)   Chronic diastolic CHF (congestive heart failure) (HCC)   PVD (peripheral vascular disease) (HCC)   Hyperkalemia   Cellulitis of foot, right   Cellulitis of both lower extremities: patient has failed outpatient oral antibiotics for cellulitis. Note that patient's x-rays did show second toe proximal phalanx fracture. ABI showed severe right lower extremity arterial occlusive disease at rest. Left ABI may be artifactually elevated due to vascular noncompressibility, underestimating degree of disease. CRP  0.9 and ESR 35. Improvng. No fever or leukocytosis.  -changed vanco and cefepime to rocephin 11/21, will continue rocephin  -Follow-up blood culture (so far no growth) -wound care on borad  PVD: as shown by ABI. [facility]VS has discussed several times about possible intervention, but pt still cannot make final decision. -consulted [facility], planing intervention, need more discuss with pt, possible CTA needed. Per Dr. Delana Meyer, decision is made that pt will be followed up in his office, no procedure will be performed in hospital 11/23. -will get PT/OT recommendation and plan to discharge pt   AKI: improving  Lab Results  Component Value Date   CREATININE 0.87 04/05/2019   CREATININE 0.89 04/04/2019   CREATININE 0.91 04/03/2019   Hypertension: - holding ARB due to renal failure.  - Continue beta-blockers. -  As needed IV hydralazine  History of  A. fib mention in the chart.  - Presently only on aspirin and beta-blocker.  Diabetes mellitus type 2, controlled, with complications: Hemoglobin A1c was 6.32 months ago. -Levemir 5 unit in AM and 10 Units PM -SSI  Hyperkalemia: resolved. K 5.7 -10 of  Lokelma given -BMP in am   DVT prophylaxis: sq heparin Code Status: full Family Communication:  Disposition Plan: inpt. Pt still has severe foot pain. ABI showed severe right lower extremity arterial occlusive disease at rest, need further work up and VVS consult pending   Consultants:   none  Procedures:    Antimicrobials:  Anti-infectives (From admission, onward)   Start     Dose/Rate Route Frequency Ordered Stop   04/03/19 0900  cefTRIAXone (ROCEPHIN) 1 g in sodium chloride 0.9 % 100 mL IVPB     1 g 200 mL/hr over 30 Minutes Intravenous Every 24 hours 04/02/19 1709     04/01/19 1400  vancomycin (VANCOCIN) IVPB 750 mg/150 ml premix  Status:  Discontinued     750 mg 150 mL/hr over 60 Minutes Intravenous Every 24 hours 04/01/19 1219 04/02/19 1730   03/31/19 1200  ceFEPIme (MAXIPIME) 2 g in sodium chloride 0.9 % 100 mL IVPB  Status:  Discontinued     2 g 200 mL/hr over 30 Minutes Intravenous Every 12 hours 03/31/19 0955 04/02/19 1708   03/31/19 0600  vancomycin (VANCOCIN) IVPB 1000 mg/200 mL premix  Status:  Discontinued     1,000 mg 200 mL/hr over 60 Minutes Intravenous Every 36 hours 03/31/19 0558 04/01/19 1219   03/30/19 2000  ceFEPIme (MAXIPIME) 2 g in sodium chloride 0.9 % 100 mL IVPB  Status:  Discontinued     2 g 200 mL/hr over 30 Minutes Intravenous Every 24 hours 03/30/19 0124 03/31/19 0955   03/29/19 2145  vancomycin (VANCOCIN) 1,750 mg in sodium chloride 0.9 % 500 mL IVPB     1,750 mg 250 mL/hr over 120 Minutes Intravenous  Once 03/29/19 2143 03/30/19 0035   03/29/19 1915  ceFEPIme (MAXIPIME) 2 g in sodium chloride 0.9 % 100 mL IVPB     2 g 200 mL/hr over 30 Minutes Intravenous  Once 03/29/19 1901 03/29/19 2001   03/29/19 1915  vancomycin (VANCOCIN) IVPB 1000 mg/200 mL premix  Status:  Discontinued     1,000 mg 200 mL/hr over 60 Minutes Intravenous  Once 03/29/19 1901 03/29/19 2143        Objective: Vitals:   04/05/19 1152 04/05/19 1626 04/05/19 1856  04/05/19 2009  BP: (!) 177/77 (!) 170/79 (!) 178/95 (!) 168/74  Pulse: 67 71  68  Resp: 18 20  20  Temp:  98.4 F (36.9 C)  98.3 F (36.8 C)  TempSrc:  Oral  Oral  SpO2: 98% 100%  96%  Weight:      Height:        Intake/Output Summary (Last 24 hours) at 04/05/2019 2134 Last data filed at 04/05/2019 2012 Gross per 24 hour  Intake 893.4 ml  Output -  Net 893.4 ml   Filed Weights   04/01/19 0500 04/02/19 0500 04/05/19 0500  Weight: 88 kg 88.9 kg 86.7 kg    Examination:  Physical Exam:  General: Not in acute distress HEENT: PERRL, EOMI, no scleral icterus, No JVD or bruit Cardiac: S1/S2, RRR, gallops or rubs Pulm: Clear to auscultation bilaterally. No rales, wheezing, rhonchi or rubs. Abd: Soft, nondistended, nontender, no rebound pain, no organomegaly, BS present Ext: has bilateral foot swelling, erythema and tenderness, scabbed   wounds with black blisters in both feet. Musculoskeletal: No joint deformities, erythema, or stiffness, ROM full Skin: No rashes.  Neuro: Alert and oriented X3, cranial nerves II-XII grossly intact, moves all extremeties normally.  Psych: Patient is not psychotic, no suicidal or hemocidal ideation.    Data Reviewed: I have personally reviewed following labs and imaging studies  CBC: Recent Labs  Lab 04/01/19 0641 04/02/19 0634 04/03/19 0453 04/04/19 0346 04/05/19 0420  WBC 7.4 7.5 7.2 7.2 7.8  HGB 10.9* 11.2* 10.6* 10.1* 10.5*  HCT 33.8* 32.5* 31.0* 30.1* 30.5*  MCV 100.6* 95.9 96.0 98.7 95.6  PLT 295 286 281 274 272   Basic Metabolic Panel: Recent Labs  Lab 04/01/19 0641 04/02/19 0634 04/03/19 0453 04/04/19 0346 04/05/19 0420  NA 141 139 138 140 138  K 5.2* 4.5 4.2 4.6 5.7*  CL 106 106 106 110 107  CO2 25 25 24 23 26  GLUCOSE 109* 107* 120* 152* 137*  BUN 47* 36* 29* 29* 25*  CREATININE 1.17* 1.01* 0.91 0.89 0.87  CALCIUM 9.3 9.1 9.0 9.1 9.3  MG  --  1.9  --   --   --    GFR: Estimated Creatinine Clearance: 52.5  mL/min (by C-G formula based on SCr of 0.87 mg/dL). Liver Function Tests: No results for input(s): AST, ALT, ALKPHOS, BILITOT, PROT, ALBUMIN in the last 168 hours. No results for input(s): LIPASE, AMYLASE in the last 168 hours. No results for input(s): AMMONIA in the last 168 hours. Coagulation Profile: No results for input(s): INR, PROTIME in the last 168 hours. Cardiac Enzymes: No results for input(s): CKTOTAL, CKMB, CKMBINDEX, TROPONINI in the last 168 hours. BNP (last 3 results) No results for input(s): PROBNP in the last 8760 hours. HbA1C: No results for input(s): HGBA1C in the last 72 hours. CBG: Recent Labs  Lab 04/04/19 2158 04/05/19 0751 04/05/19 1214 04/05/19 1638 04/05/19 2105  GLUCAP 271* 103* 188* 157* 266*   Lipid Profile: No results for input(s): CHOL, HDL, LDLCALC, TRIG, CHOLHDL, LDLDIRECT in the last 72 hours. Thyroid Function Tests: No results for input(s): TSH, T4TOTAL, FREET4, T3FREE, THYROIDAB in the last 72 hours. Anemia Panel: No results for input(s): VITAMINB12, FOLATE, FERRITIN, TIBC, IRON, RETICCTPCT in the last 72 hours. Sepsis Labs: Recent Labs  Lab 03/30/19 0852  LATICACIDVEN 1.1    Recent Results (from the past 240 hour(s))  Blood Culture (routine x 2)     Status: None   Collection Time: 03/29/19  7:06 PM   Specimen: BLOOD  Result Value Ref Range Status   Specimen Description BLOOD RIGHT ANTECUBITAL  Final   Special Requests   Final    BOTTLES DRAWN AEROBIC AND ANAEROBIC Blood Culture adequate volume   Culture   Final    NO GROWTH 5 DAYS Performed at [facility], [address] [place], [place] [address]    Report Status 04/03/2019 FINAL  Final  Blood Culture (routine x 2)     Status: None   Collection Time: 03/29/19  7:17 PM   Specimen: BLOOD  Result Value Ref Range Status   Specimen Description BLOOD BLOOD RIGHT ARM  Final   Special Requests   Final    BOTTLES DRAWN AEROBIC AND ANAEROBIC Blood Culture adequate volume    Culture   Final    NO GROWTH 5 DAYS Performed at [facility], [address] [place], [place] [address]    Report Status 04/03/2019 FINAL  Final  SARS CORONAVIRUS 2 (TAT 6-24 HRS) Nasopharyngeal   Nasopharyngeal Swab     Status: None   Collection Time: 03/29/19  7:17 PM   Specimen: Nasopharyngeal Swab  Result Value Ref Range Status   SARS Coronavirus 2 NEGATIVE NEGATIVE Final    Comment: (NOTE) SARS-CoV-2 target nucleic acids are NOT DETECTED. The SARS-CoV-2 RNA is generally detectable in upper and lower respiratory specimens during the acute phase of infection. Negative results do not preclude SARS-CoV-2 infection, do not rule out co-infections with other pathogens, and should not be used as the sole basis for treatment or other patient management decisions. Negative results must be combined with clinical observations, patient history, and epidemiological information. The expected result is Negative. Fact Sheet for Patients: https://www.fda.gov/media/138098/download Fact Sheet for Healthcare Providers: https://www.fda.gov/media/138095/download This test is not yet approved or cleared by the [place] FDA and  has been authorized for detection and/or diagnosis of SARS-CoV-2 by FDA under an Emergency Use Authorization (EUA). This EUA will remain  in effect (meaning this test can be used) for the duration of the COVID-19 declaration under Section 56 4(b)(1) of the Act, 21 U.S.C. section 360bbb-3(b)(1), unless the authorization is terminated or revoked sooner. Performed at [facility], [address] [place], [place] [address]      Radiology Studies: No results found.      Scheduled Meds: . aspirin  325 mg Oral BID  . dextrose  1 ampule Intravenous Once  . docusate sodium  100 mg Oral BID  . heparin  5,000 Units Subcutaneous Q8H  . insulin aspart  0-9 Units Subcutaneous TID WC  . insulin aspart  10 Units Intravenous Once  . insulin detemir  10  Units Subcutaneous QHS  . insulin detemir  5 Units Subcutaneous q morning - 10a  . metoprolol tartrate  12.5 mg Oral BID  . senna-docusate  1 tablet Oral BID   Continuous Infusions: . sodium chloride Stopped (04/01/19 1503)  . cefTRIAXone (ROCEPHIN)  IV Stopped (04/05/19 1306)     LOS: 7 days    Time spent: 30 min     Xilin Niu, DO [facility] PAGER is on AMION  If 7PM-7AM, please contact night-coverage [email or web address] Password [email or web address] 04/05/2019, 9:34 PM   

## 2019-04-05 NOTE — Progress Notes (Addendum)
The patient complained that she was left in the room sitting in the bedside commode for 20 minutes. The patient was upset that the NA was not was not able to help her after being left on the bedside commode. It was explained to the patient by the nurse tech that she was called to care for another patient upon  leaving her room and forgot and apologized. The patient indicated that she felt like things were taken person to her and that her life was in danger if something could have happen; for example a fall. The nurse tech tried to explain to the patient what happen but the patient would talk over the nurse tech at times. The Nurse, NA and charged nurse apologized but the patient was upset. The patient was informed that her complain would be notified to the 2c director. The charge nurse will notify the director of the complain.

## 2019-04-05 NOTE — TOC Initial Note (Addendum)
Transition of Care [facility]) - Initial/Assessment Note    Patient Details  Name: [name] MRN: [id number] Date of Birth: 11/01/1930  Transition of Care [facility]) CM/SW Contact:    Ross Ludwig, LCSW Phone Number: 04/05/2019, 3:00pm   Clinical Narrative:                  Patient is an 85 year old female who is alert and oriented x4.  Patient is a resident at [facility] facility.  Patient expressed concern that she wants to know if there  Is a speciality facility that does wound care for patient.  CSW informed her that the SNFs that do rehab have their own wound care team.  Patient expressed that she does not want to discharge from the hospital until her wound is healed completely because she is concerned if she leaves before, it may open up and become more infected again.  Patient states that if the physician tries to discharge her too soon she will appeal the discharge.  Patient stated that she has heard stories about people at SNFs and they end up becoming worse then when they went in.  CSW explained to her about the process of finding SNFs and how insurance will pay for the stay.  Patient did not seem receptive about going to SNF for short term rehab and to let her wounds heal at a SNF.  CSW informed her about the different star ratings from CMS and Medicare.  Paitient expressed understanding .  CSW to continue to follow patient's progress throughout discharge planning.  Expected Discharge Plan: Skilled Nursing Facility Barriers to Discharge: Continued Medical Work up   Patient Goals and CMS Choice Patient states their goals for this hospitalization and ongoing recovery are:: To have her wound healed up and then return back home. CMS Medicare.gov Compare Post Acute Care list provided to:: Patient Choice offered to / list presented to : Patient  Expected Discharge Plan and Services Expected Discharge Plan: [place] In-house Referral: Clinical Social  Work   Post Acute Care Choice: [place] Living arrangements for the past 2 months: [facility]([place])                                      Prior Living Arrangements/Services Living arrangements for the past 2 months: Independent Living Facility([place]) Lives with:: Self Patient language and need for interpreter reviewed:: Yes Do you feel safe going back to the place where you live?: No   Patient feels she does not want to leave hospital until her wound is completely healed.  Need for Family Participation in Patient Care: No (Comment) Care giver support system in place?: No (comment)   Criminal Activity/Legal Involvement Pertinent to Current Situation/Hospitalization: No - Comment as needed  Activities of Daily Living Home Assistive Devices/Equipment: None(Lives at [facility]) ADL Screening (condition at time of admission) Patient's cognitive ability adequate to safely complete daily activities?: Yes Is the patient deaf or have difficulty hearing?: No Does the patient have difficulty seeing, even when wearing glasses/contacts?: No Does the patient have difficulty concentrating, remembering, or making decisions?: No Patient able to express need for assistance with ADLs?: Yes Does the patient have difficulty dressing or bathing?: No Independently performs ADLs?: Yes (appropriate for developmental age) Does the patient have difficulty walking or climbing stairs?: Yes Weakness of Legs: Both Weakness of  Arms/Hands: None  Permission Sought/Granted Permission sought to share information with : Facility Art therapist granted to share information with : Yes, Verbal Permission Granted  Share Information with NAME: Fernand Parkins   [id number] or Luci Bank   [phone]  Permission granted to share info w AGENCY: SNF admissions        Emotional Assessment Appearance:: Appears  stated age Attitude/Demeanor/Rapport: Engaged Affect (typically observed): Appropriate, Agitated, Accepting, Stable Orientation: : Oriented to Self, Oriented to Place, Oriented to  Time, Oriented to Situation Alcohol / Substance Use: Not Applicable Psych Involvement: No (comment)  Admission diagnosis:  Pain [R52] Cellulitis of foot, right [L03.115] Failure of outpatient treatment [Z78.9] Cellulitis [L03.90] Patient Active Problem List   Diagnosis Date Noted  . Cellulitis of foot, right   . Hyperkalemia 04/01/2019  . Chronic diastolic CHF (congestive heart failure) ([place]) 03/30/2019  . PVD (peripheral vascular disease) ([place]) 03/30/2019  . Cellulitis of both lower extremities 03/29/2019  . ARF (acute renal failure) ([place]) 03/29/2019  . Postmenopausal osteoporosis 05/18/2017  . Carpal tunnel syndrome on right 08/27/2016  . Persistent atrial fibrillation ([place]) 07/14/2016  . Backache 06/27/2016  . Aortic heart murmur 09/10/2015  . Macroalbuminuric diabetic nephropathy ([place]) 06/25/2015  . Essential (primary) hypertension 12/06/2014  . Cerebral meningioma ([place]) 05/19/2014  . Complication of diabetes mellitus ([place]) 05/19/2014  . History of neoplasm of bladder 05/19/2014  . Personal history of healed traumatic fracture 05/19/2014  . Diabetes mellitus type 2, controlled, with complications ([place]) [id number]   PCP:  Rusty Aus, MD Pharmacy:   [id number] - [place], [place] [address] [place] [address] Phone: [phone] Fax: [phone]     Social Determinants of Health (SDOH) Interventions    Readmission Risk Interventions No flowsheet data found.

## 2019-04-05 NOTE — Progress Notes (Signed)
MD notified: Would you like to order PRN BP meds for this patient. Her systolic BP is above 0000000 as per order parameters.

## 2019-04-06 DIAGNOSIS — Z789 Other specified health status: Secondary | ICD-10-CM

## 2019-04-06 DIAGNOSIS — I70234 Atherosclerosis of native arteries of right leg with ulceration of heel and midfoot: Secondary | ICD-10-CM

## 2019-04-06 LAB — GLUCOSE, CAPILLARY
Glucose-Capillary: 123 mg/dL — ABNORMAL HIGH (ref 70–99)
Glucose-Capillary: 180 mg/dL — ABNORMAL HIGH (ref 70–99)
Glucose-Capillary: 227 mg/dL — ABNORMAL HIGH (ref 70–99)
Glucose-Capillary: 212 mg/dL — ABNORMAL HIGH (ref 70–99)

## 2019-04-06 LAB — BASIC METABOLIC PANEL
Anion gap: 9 (ref 5–15)
BUN: 24 mg/dL — ABNORMAL HIGH (ref 8–23)
CO2: 25 mmol/L (ref 22–32)
Calcium: 9.1 mg/dL (ref 8.9–10.3)
Chloride: 104 mmol/L (ref 98–111)
Creatinine, Ser: 0.82 mg/dL (ref 0.44–1.00)
GFR calc Af Amer: >60 mL/min (ref >60)
GFR calc non Af Amer: >60 mL/min (ref >60)
Glucose, Bld: 151 mg/dL — ABNORMAL HIGH (ref 70–99)
Potassium: 4.6 mmol/L (ref 3.5–5.1)
Sodium: 138 mmol/L (ref 135–145)

## 2019-04-06 LAB — CBC
HCT: 28.4 % — ABNORMAL LOW (ref 36.0–46.0)
Hemoglobin: 9.9 g/dL — ABNORMAL LOW (ref 12.0–15.0)
MCH: 33.1 pg (ref 26.0–34.0)
MCHC: 34.9 g/dL (ref 30.0–36.0)
MCV: 95 fL (ref 80.0–100.0)
Platelets: 280 10*3/uL (ref 150–400)
RBC: 2.99 MIL/uL — ABNORMAL LOW (ref 3.87–5.11)
RDW: 14.4 % (ref 11.5–15.5)
WBC: 8 10*3/uL (ref 4.0–10.5)
nRBC: 0 % (ref 0.0–0.2)

## 2019-04-06 MED ORDER — SODIUM CHLORIDE 0.9 % IV SOLN
1.0000 g | INTRAVENOUS | Status: AC
  Administered 2019-04-07 – 2019-04-08 (×2): 1 g via INTRAVENOUS
  Filled 2019-04-06 (×2): qty 1

## 2019-04-06 NOTE — Progress Notes (Signed)
Physical Therapy Treatment Patient Details Name: [name] MRN: [id number] DOB: 18-Dec-1930 Today's Date: 04/06/2019    History of Present Illness Pt. is an 85 y.o. female who was admitted to [facility] with LE edema, cellulitis, and worsening renal function. Pt. sustained an injury 10 days ago when a heavy object fell on her both of her feet. Per chert review, an xray obtained on 11/13 revealed a right 2nd digit proximal phalanx fracture. PMHx includes: DM Type II, A-Fib, and Meningioma.    PT Comments    Agrees to session this attempt.  To edge of bed with rails but no assist.  Sitting balance without difficulty for seated ex as below.  Stood with supervision and was able to walk to door and back x 3 with RW and slow steady gait.    Pt self directs session and is generally argumentative when suggested ideas to improve mobility resistive to education or suggestions.   Follow Up Recommendations  Outpatient PT     Equipment Recommendations  None recommended by PT    Recommendations for Other Services       Precautions / Restrictions Precautions Precautions: Fall Restrictions Weight Bearing Restrictions: No    Mobility  Bed Mobility Overal bed mobility: Modified Independent             General bed mobility comments: mod indep from flat bed using bed rails to assist, no assist needed from therapist  Transfers Overall transfer level: Modified independent Equipment used: Rolling walker (2 wheeled)                Ambulation/Gait Ambulation/Gait assistance: Supervision   Assistive device: Rolling walker (2 wheeled) Gait Pattern/deviations: Step-through pattern Gait velocity: decreased       Stairs             Wheelchair Mobility    Modified Rankin (Stroke Patients Only)       Balance Overall balance assessment: Modified Independent   Sitting balance-Leahy Scale: Good     Standing balance support: Bilateral upper extremity supported Standing  balance-Leahy Scale: Good Standing balance comment: no LOB with gait/turns                            Cognition Arousal/Alertness: Awake/alert Behavior During Therapy: Agitated Overall Cognitive Status: Within Functional Limits for tasks assessed                                 General Comments: cues to redirect at times      Exercises Other Exercises Other Exercises: seated AROM ankle pumps, LAQ x 10 BLE Other Exercises: Pt instructed in modifications for LB ADL tasks to improve comfort 2/2 bilat foot pain    General Comments General comments (skin integrity, edema, etc.): B feet bandaged, clean and dry, grip socks over top      Pertinent Vitals/Pain Pain Assessment: Faces Pain Score: 5  Faces Pain Scale: Hurts a little bit Pain Location: general soreness noted BLE with mobility Pain Descriptors / Indicators: Sore Pain Intervention(s): Limited activity within patient's tolerance;Monitored during session    Home Living Family/patient expects to be discharged to:: Private residence Living Arrangements: Alone   Type of Home: Independent living facility     Home Layout: One level Home Equipment: Walker - 4 wheels;Grab bars - tub/shower;Shower seat;Bedside commode;Hand held shower head;Adaptive equipment      Prior Function Level of Independence:  Independent with assistive device(s)      Comments: used rollater for all mobility, indep with ADL and med mgt   PT Goals (current goals can now be found in the care plan section) Acute Rehab PT Goals Patient Stated Goal: to return to PLOF with improved independence Progress towards PT goals: Progressing toward goals    Frequency    Min 2X/week      PT Plan Current plan remains appropriate    Co-evaluation              AM-PAC PT "6 Clicks" Mobility   Outcome Measure  Help needed turning from your back to your side while in a flat bed without using bedrails?: None Help needed moving  from lying on your back to sitting on the side of a flat bed without using bedrails?: None Help needed moving to and from a bed to a chair (including a wheelchair)?: None Help needed standing up from a chair using your arms (e.g., wheelchair or bedside chair)?: None Help needed to walk in hospital room?: A Little Help needed climbing 3-5 steps with a railing? : A Little 6 Click Score: 22    End of Session Equipment Utilized During Treatment: Gait belt Activity Tolerance: Patient tolerated treatment well Patient left: in bed;with call bell/phone within reach;with bed alarm set Nurse Communication: Mobility status Pain - part of body: Ankle and joints of foot     Time: [id number] PT Time Calculation (min) (ACUTE ONLY): 31 min  Charges:  $Gait Training: 8-22 mins $Therapeutic Exercise: 8-22 mins                    Chesley Noon, PTA 04/06/19, 12:28 PM

## 2019-04-06 NOTE — Care Management Important Message (Signed)
Important Message  Patient Details  Name: [name] MRN: [id number] Date of Birth: 20-Nov-1930   Medicare Important Message Given:  Yes     Dannette Barbara 04/06/2019, 11:12 AM

## 2019-04-06 NOTE — NC FL2 (Signed)
[place] LEVEL OF CARE SCREENING TOOL     IDENTIFICATION  Patient Name: [name] Birthdate: 1930-12-14 Sex: female Admission Date (Current Location): 03/29/2019  [place] and [place] Number:  Engineering geologist and Address:  [facility], [address] [place], [place] [address]      Provider Number: [id number]  Attending Physician Name and Address:  Ivor Costa, MD  Relative Name and Phone Number:  Fernand Parkins   [phone] or Barkley Bruns Son   [phone]    Current Level of Care: Hospital Recommended Level of Care: [place] Prior Approval Number:    Date Approved/Denied:   PASRR Number: [id number]  Discharge Plan: SNF    Current Diagnoses: Patient Active Problem List   Diagnosis Date Noted  . Failure of outpatient treatment   . Cellulitis of foot, right   . Hyperkalemia 04/01/2019  . Chronic diastolic CHF (congestive heart failure) ([place]) 03/30/2019  . PVD (peripheral vascular disease) ([place]) 03/30/2019  . Cellulitis of both lower extremities 03/29/2019  . ARF (acute renal failure) ([place]) 03/29/2019  . Postmenopausal osteoporosis 05/18/2017  . Carpal tunnel syndrome on right 08/27/2016  . Persistent atrial fibrillation ([place]) 07/14/2016  . Backache 06/27/2016  . Aortic heart murmur 09/10/2015  . Macroalbuminuric diabetic nephropathy ([place]) 06/25/2015  . Essential (primary) hypertension 12/06/2014  . Cerebral meningioma ([place]) 05/19/2014  . Complication of diabetes mellitus ([place]) 05/19/2014  . History of neoplasm of bladder 05/19/2014  . Personal history of healed traumatic fracture 05/19/2014  . Diabetes mellitus type 2, controlled, with complications ([place]) [id number]    Orientation RESPIRATION BLADDER Height & Weight     Self, Time, Situation, Place  Normal Continent Weight: 191 lb 3.2 oz (86.7 kg) Height:  5\' 7"  (170.2 cm)  BEHAVIORAL SYMPTOMS/MOOD NEUROLOGICAL BOWEL NUTRITION  STATUS      Continent Diet(Cardiac diet)  AMBULATORY STATUS COMMUNICATION OF NEEDS Skin   Limited Assist Verbally Surgical wounds(Daily dressing changes.)                       Personal Care Assistance Level of Assistance  Bathing, Dressing, Feeding Bathing Assistance: Limited assistance Feeding assistance: Independent Dressing Assistance: Limited assistance     Functional Limitations Info  Sight, Hearing, Speech Sight Info: Adequate Hearing Info: Adequate Speech Info: Adequate    SPECIAL CARE FACTORS FREQUENCY  PT (By licensed PT), OT (By licensed OT)     PT Frequency: Outpatient therapy OT Frequency: Outpatient therapy            Contractures Contractures Info: Not present    Additional Factors Info  Code Status, Allergies, Insulin Sliding Scale Code Status Info: Full Code Allergies Info: Alendronate Iodinated Diagnostic Agents Statins   Insulin Sliding Scale Info: insulin aspart (novoLOG) injection 0-9 Units 3x a day with meals       Current Medications (04/06/2019):  This is the current hospital active medication list Current Facility-Administered Medications  Medication Dose Route Frequency Provider Last Rate Last Dose  . 0.9 %  sodium chloride infusion   Intravenous PRN Rise Patience, MD   Stopped at 04/01/19 1503  . acetaminophen (TYLENOL) tablet 650 mg  650 mg Oral Q6H PRN Rise Patience, MD       Or  . acetaminophen (TYLENOL) suppository 650 mg  650 mg Rectal Q6H PRN Rise Patience, MD      . aspirin EC tablet 325 mg  325 mg Oral BID Rise Patience,  MD   325 mg at 04/06/19 0850  . [START ON 04/07/2019] cefTRIAXone (ROCEPHIN) 1 g in sodium chloride 0.9 % 100 mL IVPB  1 g Intravenous Q24H Ivor Costa, MD      . dextrose 50 % solution 50 mL  1 ampule Intravenous Once Kirby-Graham, Karsten Fells, NP      . docusate sodium (COLACE) capsule 100 mg  100 mg Oral BID Ivor Costa, MD   100 mg at 04/06/19 0835  . heparin injection 5,000 Units   5,000 Units Subcutaneous Q8H Rise Patience, MD   5,000 Units at 04/06/19 1410  . hydrALAZINE (APRESOLINE) tablet 50 mg  50 mg Oral TID PRN Ivor Costa, MD   50 mg at 04/05/19 1856  . insulin aspart (novoLOG) injection 0-9 Units  0-9 Units Subcutaneous TID WC Rise Patience, MD      . insulin aspart (novoLOG) injection 10 Units  10 Units Intravenous Once Kirby-Graham, Karsten Fells, NP      . insulin detemir (LEVEMIR) injection 10 Units  10 Units Subcutaneous QHS Rise Patience, MD   10 Units at 04/05/19 2119  . insulin detemir (LEVEMIR) injection 5 Units  5 Units Subcutaneous q morning - 10a Rise Patience, MD   5 Units at 04/06/19 [phone]  . metoprolol tartrate (LOPRESSOR) tablet 12.5 mg  12.5 mg Oral BID Rise Patience, MD   12.5 mg at 04/06/19 0836  . ondansetron (ZOFRAN) tablet 4 mg  4 mg Oral Q6H PRN Rise Patience, MD       Or  . ondansetron [facility]) injection 4 mg  4 mg Intravenous Q6H PRN Rise Patience, MD      . polyethylene glycol (MIRALAX / GLYCOLAX) packet 17 g  17 g Oral Daily PRN Ivor Costa, MD      . senna-docusate (Senokot-S) tablet 1 tablet  1 tablet Oral BID Ivor Costa, MD      . triamcinolone 0.1 % cream : eucerin cream, 1:1   Topical BID PRN Ivor Costa, MD         Discharge Medications: Please see discharge summary for a list of discharge medications.  Relevant Imaging Results:  Relevant Lab Results:   Additional Information SSN [id number]  Ross Ludwig, LCSW

## 2019-04-06 NOTE — TOC Progression Note (Signed)
Transition of Care [facility]) - Progression Note    Patient Details  Name: [name] MRN: [id number] Date of Birth: 25-Sep-1930  Transition of Care [facility]) CM/SW Contact  Ross Ludwig, [place] Phone Number: 04/06/2019, 6:13 PM  Clinical Narrative:     CSW spoke with patient, and she decided she would like to go to the [place] side of [place] to help with her wound care, and gain her strength back up to walk again.  CSW contacted Seth Bake in Admissions, and they can accept patient on Friday if she is medically ready for discharge and orders have been received.  CSW updated bedside nurse and physician.  CSW to continue to follow patient's progress throughout discharge planning.   Expected Discharge Plan: [place] Barriers to Discharge: Continued Medical Work up  Expected Discharge Plan and Services Expected Discharge Plan: [place] In-house Referral: Clinical Social Work   Post Acute Care Choice: [place] Living arrangements for the past 2 months: Independent Living Facility([place])                                       Social Determinants of Health (SDOH) Interventions    Readmission Risk Interventions No flowsheet data found.

## 2019-04-06 NOTE — Progress Notes (Signed)
PT Cancellation Note  Patient Details Name: [name] MRN: [id number] DOB: [birth year]   Cancelled Treatment:    Reason Eval/Treat Not Completed: Other (comment). Treatment attempted, pt currently on [facility] and preparing for bath. Pt prefers to have therapy at another time. Will re-attempt.   Cassiopeia Florentino 04/06/2019, 9:57 AM Greggory Stallion, PT, DPT [phone]

## 2019-04-06 NOTE — Progress Notes (Signed)
[facility]  Daily Progress Note   Subjective  -  Still having pain in her feet mostly the right one at this point minimally so the left.  Overall the pain is significantly improved from when she was admitted  Objective Vitals:   04/05/19 1856 04/05/19 2009 04/06/19 0412 04/06/19 1145  BP: (!) 178/95 (!) 168/74 (!) 178/90 (!) 166/81  Pulse:  68 70 69  Resp:  20 20 16   Temp:  98.3 F (36.8 C) 98.3 F (36.8 C) 98.7 F (37.1 C)  TempSrc:  Oral Oral Oral  SpO2:  96% 99%   Weight:      Height:        Intake/Output Summary (Last 24 hours) at 04/06/2019 1215 Last data filed at 04/06/2019 1007 Gross per 24 hour  Intake 1253.4 ml  Output 0 ml  Net 1253.4 ml    PULM  Normal effort , no use of accessory muscles CV  No JVD, RRR Abd      No distended, nontender VASC  dressings are changed there is full-thickness tissue loss across the dorsum of the right foot.  The left foot remains stable and I believe will heal  Laboratory CBC    Component Value Date/Time   WBC 8.0 04/06/2019 0357   HGB 9.9 (L) 04/06/2019 0357   HCT 28.4 (L) 04/06/2019 0357   PLT 280 04/06/2019 0357    BMET    Component Value Date/Time   NA 138 04/06/2019 0357   NA 136 (A) 01/12/2019   K 4.6 04/06/2019 0357   CL 104 04/06/2019 0357   CO2 25 04/06/2019 0357   GLUCOSE 151 (H) 04/06/2019 0357   BUN 24 (H) 04/06/2019 0357   BUN 30 (A) 01/12/2019   CREATININE 0.82 04/06/2019 0357   CALCIUM 9.1 04/06/2019 0357   GFRNONAA >60 04/06/2019 0357   GFRAA >60 04/06/2019 0357    Assessment/Planning:   1.  Atherosclerotic occlusive disease bilateral lower extremities with ulcerations of the right forefoot:  We discussed her lower extremity situation once again.  Predominantly focusing on the right as I believe the left will heal tissue damage appears fairly superficial.  On the right it is quite clear given her ABI of less than 0.5 in association with the multiple ulcers that these  will not heal without revascularization.  She is somewhat fixated on the idea that antibiotics will resolve everything and feels that being discharged before a complete course of IV antibiotics is inappropriate.  I asked that she discuss this with the medical service.  We again discussed methods to visualize the extent of her occlusive disease.  Today she inquired regarding an MRI.  She is aware that MRIs do not necessarily require contrast.  I did describe to her that a time-of-flight MRI gives very poor visualization of the vasculature below the knee.  Given that her kidney function is recovered and then her infection appears to be essentially resolved I again recommended that we follow-up in the office I can get an arterial duplex to better map out her pattern of occlusive disease.  This is acceptable to her.  Hopefully she will reconsider angiography with the intention for intervention.  Given her tissue loss I believe she is placing her own limb in jeopardy and have reiterated the importance of revascularization for wound healing and preventing limb loss.  At this point I will plan to see her back in the office.    Hortencia Pilar  04/06/2019, 12:15 PM

## 2019-04-06 NOTE — Evaluation (Signed)
Occupational Therapy Evaluation Patient Details Name: [name] MRN: [id number] DOB: June 27, 1930 Today's Date: 04/06/2019    History of Present Illness Pt. is an 85 y.o. female who was admitted to [facility] with LE edema, cellulitis, and worsening renal function. Pt. sustained an injury 10 days ago when a heavy object fell on her both of her feet. Per chert review, an xray obtained on 11/13 revealed a right 2nd digit proximal phalanx fracture. PMHx includes: DM Type II, A-Fib, and Meningioma.   Clinical Impression   Pt seen for OT evaluation this date. Prior to hospital admission, pt was ambulating with rollator and indep with ADL and med mgt.  Pt lives at [place].  Currently pt demonstrates impairments in strength, balance, and bilat (R>L) foot/ankle pain requiring supervision to CGA for sit to stand ADL and functional mobility with RW. Pt instructed in modifications for LB ADL tasks to improve comfort 2/2 bilat foot pain. Pt able to bring each LE across opposite knee to minimize need to bend down to the floor for LB dressing. Pt would benefit from skilled OT to address noted impairments and functional limitations (see below for any additional details) in order to maximize safety and independence while minimizing falls risk and caregiver burden.  Upon hospital discharge, recommend pt discharge back to ILF with OP OT. Suspect continued nursing/wounds care.    Follow Up Recommendations  Outpatient OT    Equipment Recommendations  None recommended by OT    Recommendations for Other Services       Precautions / Restrictions Precautions Precautions: Fall Restrictions Weight Bearing Restrictions: No      Mobility Bed Mobility Overal bed mobility: Modified Independent             General bed mobility comments: mod indep from flat bed using bed rails to assist, no assist needed from therapist  Transfers Overall transfer level: Modified independent Equipment used: Rolling walker  (2 wheeled)                  Balance Overall balance assessment: Needs assistance   Sitting balance-Leahy Scale: Good     Standing balance support: Bilateral upper extremity supported Standing balance-Leahy Scale: Good                             ADL either performed or assessed with clinical judgement   ADL Overall ADL's : Needs assistance/impaired                                       General ADL Comments: supervision to CGA for sit to stand ADL tasks and toilet transfers     Vision Baseline Vision/History: Wears glasses Wears Glasses: Distance only;Reading only(driving) Patient Visual Report: No change from baseline       Perception     Praxis      Pertinent Vitals/Pain Pain Assessment: 0-10 Pain Score: 5  Pain Location: B plantar feet Pain Descriptors / Indicators: Discomfort;Aching Pain Intervention(s): Limited activity within patient's tolerance;Monitored during session;Repositioned     Hand Dominance Right   Extremity/Trunk Assessment Upper Extremity Assessment Upper Extremity Assessment: Overall WFL for tasks assessed   Lower Extremity Assessment Lower Extremity Assessment: Generalized weakness(at least 4-/5 bilat)       Communication Communication Communication: No difficulties   Cognition Arousal/Alertness: Awake/alert Behavior During Therapy: WFL for tasks assessed/performed Overall Cognitive  Status: Within Functional Limits for tasks assessed                                 General Comments: cues to redirect at times   General Comments  B feet bandaged, clean and dry, grip socks over top    Exercises Other Exercises Other Exercises: Pt instructed in modifications for LB ADL tasks to improve comfort 2/2 bilat foot pain   Shoulder Instructions      Home Living Family/patient expects to be discharged to:: Private residence Living Arrangements: Alone   Type of Home: [place]: One level     Bathroom Shower/Tub: Walk-in Hydrologist: Standard(BSC over toilet)     Home Equipment: Environmental consultant - 4 wheels;Grab bars - tub/shower;Shower seat;Bedside commode;Hand held shower head;Adaptive equipment Adaptive Equipment: [place]acher        Prior Functioning/Environment Level of Independence: Independent with assistive device(s)        Comments: used rollater for all mobility, indep with ADL and med mgt        OT Problem List: Decreased strength;Pain;Decreased activity tolerance;Impaired balance (sitting and/or standing)      OT Treatment/Interventions:      OT Goals(Current goals can be found in the care plan section) Acute Rehab OT Goals Patient Stated Goal: to return to PLOF with improved independence OT Goal Formulation: With patient Time For Goal Achievement: 04/20/19 Potential to Achieve Goals: Good ADL Goals Pt Will Perform Lower Body Dressing: with modified independence;with adaptive equipment;sit to/from stand Pt Will Transfer to Toilet: with modified independence;ambulating(BSC over toilet, LRAD for amb) Additional ADL Goal #1: Pt will verbalize plan to implement at least 1 learned falls prevention strategy Additional ADL Goal #2: Pt will verbalize plan to implement at least 1 learned energy conservation strategy  OT Frequency:     Barriers to D/C:            Co-evaluation              AM-PAC OT "6 Clicks" Daily Activity     Outcome Measure Help from another person eating meals?: None Help from another person taking care of personal grooming?: None Help from another person toileting, which includes using toliet, bedpan, or urinal?: A Little Help from another person bathing (including washing, rinsing, drying)?: A Little Help from another person to put on and taking off regular upper body clothing?: None Help from another person to put on and taking off regular lower body clothing?: A  Little 6 Click Score: 21   End of Session Equipment Utilized During Treatment: Gait belt;Rolling walker  Activity Tolerance: Patient tolerated treatment well Patient left: in bed;with call bell/phone within reach;with bed alarm set;with nursing/sitter in room  OT Visit Diagnosis: History of falling (Z91.81);Muscle weakness (generalized) (M62.81);Pain Pain - Right/Left: Right(R>L) Pain - part of body: Ankle and joints of foot                Time: [id number] OT Time Calculation (min): 29 min Charges:  OT General Charges $OT Visit: 1 Visit OT Evaluation $OT Eval Low Complexity: 1 Low OT Treatments $Self Care/Home Management : 8-22 mins  Jeni Salles, MPH, MS, OTR/L ascom [phone] 04/06/19, 11:59 AM

## 2019-04-06 NOTE — Progress Notes (Addendum)
PROGRESS NOTE    [name]  MGN:[id number] DOB: [birth year] DOA: 03/29/2019 PCP: Rusty Aus, MD    Brief Narrative:  [name] is a 85 y.o. female with history of diabetes mellitus type 2, chronic diastolic dysfunction, hypertension had an injury to have both feet when and heavy object fell onto it about 10 days ago.  The feet started swelling become erythematous with wound and had followed up with her primary care physician who prescribed antibiotics.  Despite taking which patient's wound has been getting worse.  Labs were done which showed worsening renal function and patient was instructed to come to the ER.  Patient states about 2 to 3 weeks ago patient did have some diarrhea but nothing further from that.  Denies any vomiting or abdominal pain chest pain or shortness of breath.  X-rays done of the feet on November 13 showed swelling of the right foot with second toe proximal phalanx fracture.  ED Course: In the ER labs reveal creatinine 1.9 with a lactate of 3.2 creatinine is increased from baseline of around 1.1 in September 2020.  Other labs show WBC of 10.4 globin 12.3 platelets 350.  COVID-19 test is pending blood cultures were obtained started on empiric antibiotics fluids and admitted for cellulitis of the both lower extremity with acute renal failure.  Interim History: -11/18: ABI: showed severe right lower extremity arterial occlusive disease at rest. Left ABI may be artifactually elevated due to vascular noncompressibility, underestimating degree of disease. -11/19: consulted [facility], Dr. Delana Meyer, pending recommendation -11/20: k 5.2. pt refused Lokelma -->11/21 K 4.5 with IVF treatment. D/C'ed IVF -11/21: narrow down antibiotics from vancomycin and cefepime to Rocephin - 11/24: K 5.7, 10 g of Lokelma is given - 11/25: foot dressings are changed by [facility], Dr. Delana Meyer  Subjective: Patient still has severe bilateral foot pain, which is constant, sharp and worse upon slight  touch,particularlly on the right foot. No fever or chills. No CP, SOB, no nausea, vomiting, diarrhea or abdominal pain.  Assessment & Plan:   Principal Problem:   Cellulitis of both lower extremities Active Problems:   Essential (primary) hypertension   Persistent atrial fibrillation (HCC)   Diabetes mellitus type 2, controlled, with complications (HCC)   ARF (acute renal failure) (HCC)   Chronic diastolic CHF (congestive heart failure) (HCC)   PVD (peripheral vascular disease) (HCC)   Hyperkalemia   Cellulitis of foot, right   Cellulitis of both lower extremities: patient has failed outpatient oral antibiotics for cellulitis. Note that patient's x-rays did show second toe proximal phalanx fracture. ABI showed severe right lower extremity arterial occlusive disease at rest. Left ABI may be artifactually elevated due to vascular noncompressibility, underestimating degree of disease. CRP  0.9 and ESR 35. The left foot lesion is improving. The right foot still has full-thickness tissue loss across the dorsum of the right foot, locally looks severe and nonhealing. Though pt has no fever or leukocytosis, given her severe PAD and DM, will continue to treat pt with 2 more days of IV antibiotics. -changed vanco and cefepime to rocephin 11/21, will continue rocephin  -Follow-up blood culture (so far no growth) -wound care on borad  PVD: as shown by ABI. [facility] has discussed several times about possible intervention, but pt still cannot make final decision. -consulted [facility], planing intervention, need more discuss with pt, possible CTA needed. Per Dr. Delana Meyer, decision is made that pt will be followed up in his office, no procedure will be performed in hospital  11/23. -will get PT/OT recommendation and plan to discharge pt   AKI: improving  Lab Results  Component Value Date   CREATININE 0.82 04/06/2019   CREATININE 0.87 04/05/2019   CREATININE 0.89 04/04/2019   Hypertension: - holding ARB due to  renal failure.  - Continue beta-blockers. -  As needed IV hydralazine  History of A. fib mention in the chart.  - Presently only on aspirin and beta-blocker.  Diabetes mellitus type 2, controlled, with complications: Hemoglobin A1c was 6.32 months ago. -Levemir 5 unit in AM and 10 Units PM -SSI  Hyperkalemia: resolved. K 5.7 -10 of Lokelma given -BMP in am    DVT prophylaxis: sq heparin Code Status: full Family Communication:  Disposition Plan: inpt.  ABI showed severe right lower extremity arterial occlusive disease at rest. The right foot still has full-thickness tissue loss across the dorsum of the right foot, locally looks severe and nonhealing. Though pt has no fever or leukocytosis, given her severe PAD and DM, will continue to treat pt with 2 more days of IV antibiotics.   Consultants:   none  Procedures:    Antimicrobials:  Anti-infectives (From admission, onward)   Start     Dose/Rate Route Frequency Ordered Stop   04/07/19 0800  cefTRIAXone (ROCEPHIN) 1 g in sodium chloride 0.9 % 100 mL IVPB     1 g 200 mL/hr over 30 Minutes Intravenous Every 24 hours 04/06/19 1309 04/09/19 0759   04/03/19 0900  cefTRIAXone (ROCEPHIN) 1 g in sodium chloride 0.9 % 100 mL IVPB  Status:  Discontinued     1 g 200 mL/hr over 30 Minutes Intravenous Every 24 hours 04/02/19 1709 04/06/19 1149   04/01/19 1400  vancomycin (VANCOCIN) IVPB 750 mg/150 ml premix  Status:  Discontinued     750 mg 150 mL/hr over 60 Minutes Intravenous Every 24 hours 04/01/19 1219 04/02/19 1730   03/31/19 1200  ceFEPIme (MAXIPIME) 2 g in sodium chloride 0.9 % 100 mL IVPB  Status:  Discontinued     2 g 200 mL/hr over 30 Minutes Intravenous Every 12 hours 03/31/19 0955 04/02/19 1708   03/31/19 0600  vancomycin (VANCOCIN) IVPB 1000 mg/200 mL premix  Status:  Discontinued     1,000 mg 200 mL/hr over 60 Minutes Intravenous Every 36 hours 03/31/19 0558 04/01/19 1219   03/30/19 2000  ceFEPIme (MAXIPIME) 2 g in  sodium chloride 0.9 % 100 mL IVPB  Status:  Discontinued     2 g 200 mL/hr over 30 Minutes Intravenous Every 24 hours 03/30/19 0124 03/31/19 0955   03/29/19 2145  vancomycin (VANCOCIN) 1,750 mg in sodium chloride 0.9 % 500 mL IVPB     1,750 mg 250 mL/hr over 120 Minutes Intravenous  Once 03/29/19 2143 03/30/19 0035   03/29/19 1915  ceFEPIme (MAXIPIME) 2 g in sodium chloride 0.9 % 100 mL IVPB     2 g 200 mL/hr over 30 Minutes Intravenous  Once 03/29/19 1901 03/29/19 2001   03/29/19 1915  vancomycin (VANCOCIN) IVPB 1000 mg/200 mL premix  Status:  Discontinued     1,000 mg 200 mL/hr over 60 Minutes Intravenous  Once 03/29/19 1901 03/29/19 2143        Objective: Vitals:   04/05/19 1856 04/05/19 2009 04/06/19 0412 04/06/19 1145  BP: (!) 178/95 (!) 168/74 (!) 178/90 (!) 166/81  Pulse:  68 70 69  Resp:  _0 Temp:  98.3 F (36.8 C) 98.3 F (36.8 C) 98.7 F (37.1 C)  TempSrc:  Oral Oral Oral  SpO2:  96% 99%   Weight:      Height:        Intake/Output Summary (Last 24 hours) at 04/06/2019 1702 Last data filed at 04/06/2019 1007 Gross per 24 hour  Intake 675.33 ml  Output 0 ml  Net 675.33 ml   Filed Weights   04/01/19 0500 04/02/19 0500 04/05/19 0500  Weight: 88 kg 88.9 kg 86.7 kg    Examination:  Physical Exam:  General: Not in acute distress HEENT: PERRL, EOMI, no scleral icterus, No JVD or bruit Cardiac: S1/S2, RRR, gallops or rubs Pulm: Clear to auscultation bilaterally. No rales, wheezing, rhonchi or rubs. Abd: Soft, nondistended, nontender, no rebound pain, no organomegaly, BS present Ext: has bilateral foot swelling, erythema and tenderness, with black blisters in both feet. The left foot lesion and blister are improving, in healing process, but the right foot still has full-thickness tissue loss across the dorsum of the right foot, locally looks severe and nonhealing.  Musculoskeletal: No joint deformities, erythema, or stiffness, ROM full Skin: No rashes.   Neuro: Alert and oriented X3, cranial nerves II-XII grossly intact, moves all extremeties normally.  Psych: Patient is not psychotic, no suicidal or hemocidal ideation.    Data Reviewed: I have personally reviewed following labs and imaging studies  CBC: Recent Labs  Lab 04/02/19 0634 04/03/19 0453 04/04/19 0346 04/05/19 0420 04/06/19 0357  WBC 7.5 7.2 7.2 7.8 8.0  HGB 11.2* 10.6* 10.1* 10.5* 9.9*  HCT 32.5* 31.0* 30.1* 30.5* 28.4*  MCV 95.9 96.0 98.7 95.6 95.0  PLT 286 281 274 272 220   Basic Metabolic Panel: Recent Labs  Lab 04/02/19 0634 04/03/19 0453 04/04/19 0346 04/05/19 0420 04/06/19 0357  NA 139 138 140 138 138  K 4.5 4.2 4.6 5.7* 4.6  CL 106 106 110 107 104  CO2 _0 GLUCOSE 107* 120* 152* 137* 151*  BUN 36* 29* 29* 25* 24*  CREATININE 1.01* 0.91 0.89 0.87 0.82  CALCIUM 9.1 9.0 9.1 9.3 9.1  MG 1.9  --   --   --   --    GFR: Estimated Creatinine Clearance: 55.7 mL/min (by C-G formula based on SCr of 0.82 mg/dL). Liver Function Tests: No results for input(s): AST, ALT, ALKPHOS, BILITOT, PROT, ALBUMIN in the last 168 hours. No results for input(s): LIPASE, AMYLASE in the last 168 hours. No results for input(s): AMMONIA in the last 168 hours. Coagulation Profile: No results for input(s): INR, PROTIME in the last 168 hours. Cardiac Enzymes: No results for input(s): CKTOTAL, CKMB, CKMBINDEX, TROPONINI in the last 168 hours. BNP (last 3 results) No results for input(s): PROBNP in the last 8760 hours. HbA1C: No results for input(s): HGBA1C in the last 72 hours. CBG: Recent Labs  Lab 04/05/19 1214 04/05/19 1638 04/05/19 2105 04/06/19 0733 04/06/19 1142  GLUCAP 188* 157* 266* 123* 180*   Lipid Profile: No results for input(s): CHOL, HDL, LDLCALC, TRIG, CHOLHDL, LDLDIRECT in the last 72 hours. Thyroid Function Tests: No results for input(s): TSH, T4TOTAL, FREET4, T3FREE, THYROIDAB in the last 72 hours. Anemia Panel: No results for  input(s): VITAMINB12, FOLATE, FERRITIN, TIBC, IRON, RETICCTPCT in the last 72 hours. Sepsis Labs: No results for input(s): PROCALCITON, LATICACIDVEN in the last 168 hours.  Recent Results (from the past 240 hour(s))  Blood Culture (routine x 2)     Status: None   Collection Time: 03/29/19  7:06 PM   Specimen: BLOOD  Result  Value Ref Range Status   Specimen Description BLOOD RIGHT ANTECUBITAL  Final   Special Requests   Final    BOTTLES DRAWN AEROBIC AND ANAEROBIC Blood Culture adequate volume   Culture   Final    NO GROWTH 5 DAYS Performed at [facility], [address] [place], [place] [address]    Report Status 04/03/2019 FINAL  Final  Blood Culture (routine x 2)     Status: None   Collection Time: 03/29/19  7:17 PM   Specimen: BLOOD  Result Value Ref Range Status   Specimen Description BLOOD BLOOD RIGHT ARM  Final   Special Requests   Final    BOTTLES DRAWN AEROBIC AND ANAEROBIC Blood Culture adequate volume   Culture   Final    NO GROWTH 5 DAYS Performed at [facility], [address] [place], [place] [address]    Report Status 04/03/2019 FINAL  Final  SARS CORONAVIRUS 2 (TAT 6-24 HRS) Nasopharyngeal Nasopharyngeal Swab     Status: None   Collection Time: 03/29/19  7:17 PM   Specimen: Nasopharyngeal Swab  Result Value Ref Range Status   SARS Coronavirus 2 NEGATIVE NEGATIVE Final    Comment: (NOTE) SARS-CoV-2 target nucleic acids are NOT DETECTED. The SARS-CoV-2 RNA is generally detectable in upper and lower respiratory specimens during the acute phase of infection. Negative results do not preclude SARS-CoV-2 infection, do not rule out co-infections with other pathogens, and should not be used as the sole basis for treatment or other patient management decisions. Negative results must be combined with clinical observations, patient history, and epidemiological information. The expected result is Negative. Fact Sheet for Patients:  SugarRoll.be Fact Sheet for Healthcare Providers: https://www.woods-mathews.com/ This test is not yet approved or cleared by the [place] FDA and  has been authorized for detection and/or diagnosis of SARS-CoV-2 by FDA under an Emergency Use Authorization (EUA). This EUA will remain  in effect (meaning this test can be used) for the duration of the COVID-19 declaration under Section 56 4(b)(1) of the Act, 21 U.S.C. section 360bbb-3(b)(1), unless the authorization is terminated or revoked sooner. Performed at [facility], [place] [address], [place], [place] [address]      Radiology Studies: No results found.      Scheduled Meds: . aspirin  325 mg Oral BID  . dextrose  1 ampule Intravenous Once  . docusate sodium  100 mg Oral BID  . heparin  5,000 Units Subcutaneous Q8H  . insulin aspart  0-9 Units Subcutaneous TID WC  . insulin aspart  10 Units Intravenous Once  . insulin detemir  10 Units Subcutaneous QHS  . insulin detemir  5 Units Subcutaneous q morning - 10a  . metoprolol tartrate  12.5 mg Oral BID  . senna-docusate  1 tablet Oral BID   Continuous Infusions: . sodium chloride Stopped (04/01/19 1503)  . [START ON 04/07/2019] cefTRIAXone (ROCEPHIN)  IV       LOS: 8 days    Time spent: 30 min     Ivor Costa, DO [facility] PAGER is on [place]  If 7PM-7AM, please contact night-coverage [email or web address] Password [email or web address] 04/06/2019, 5:02 PM

## 2019-04-07 DIAGNOSIS — R079 Chest pain, unspecified: Secondary | ICD-10-CM

## 2019-04-07 LAB — TROPONIN I (HIGH SENSITIVITY)
Troponin I (High Sensitivity): 11 ng/L (ref <18)
Troponin I (High Sensitivity): 13 ng/L (ref <18)

## 2019-04-07 LAB — GLUCOSE, CAPILLARY
Glucose-Capillary: 125 mg/dL — ABNORMAL HIGH (ref 70–99)
Glucose-Capillary: 138 mg/dL — ABNORMAL HIGH (ref 70–99)
Glucose-Capillary: 240 mg/dL — ABNORMAL HIGH (ref 70–99)
Glucose-Capillary: 134 mg/dL — ABNORMAL HIGH (ref 70–99)

## 2019-04-07 LAB — BASIC METABOLIC PANEL
Anion gap: 9 (ref 5–15)
BUN: 26 mg/dL — ABNORMAL HIGH (ref 8–23)
CO2: 26 mmol/L (ref 22–32)
Calcium: 9.1 mg/dL (ref 8.9–10.3)
Chloride: 104 mmol/L (ref 98–111)
Creatinine, Ser: 0.97 mg/dL (ref 0.44–1.00)
GFR calc Af Amer: >60 mL/min (ref >60)
GFR calc non Af Amer: 53 mL/min — ABNORMAL LOW (ref >60)
Glucose, Bld: 140 mg/dL — ABNORMAL HIGH (ref 70–99)
Potassium: 4.5 mmol/L (ref 3.5–5.1)
Sodium: 139 mmol/L (ref 135–145)

## 2019-04-07 LAB — CBC
HCT: 28.1 % — ABNORMAL LOW (ref 36.0–46.0)
Hemoglobin: 9.8 g/dL — ABNORMAL LOW (ref 12.0–15.0)
MCH: 33.1 pg (ref 26.0–34.0)
MCHC: 34.9 g/dL (ref 30.0–36.0)
MCV: 94.9 fL (ref 80.0–100.0)
Platelets: 259 10*3/uL (ref 150–400)
RBC: 2.96 MIL/uL — ABNORMAL LOW (ref 3.87–5.11)
RDW: 14.3 % (ref 11.5–15.5)
WBC: 6.6 10*3/uL (ref 4.0–10.5)
nRBC: 0 % (ref 0.0–0.2)

## 2019-04-07 MED ORDER — HYDROCHLOROTHIAZIDE 12.5 MG PO CAPS: 12.5000 mg | ORAL_CAPSULE | Freq: Every day | ORAL | Status: DC

## 2019-04-07 MED ORDER — HYDRALAZINE HCL 20 MG/ML IJ SOLN
5.0000 mg | Freq: Once | INTRAMUSCULAR | Status: AC
  Administered 2019-04-07: 13:00:00 5 mg via INTRAVENOUS
  Filled 2019-04-07: qty 1

## 2019-04-07 MED ORDER — AMLODIPINE BESYLATE 5 MG PO TABS
5.0000 mg | ORAL_TABLET | Freq: Every day | ORAL | Status: DC
  Administered 2019-04-07: 5 mg via ORAL
  Filled 2019-04-07 (×2): qty 1

## 2019-04-07 MED ORDER — NITROGLYCERIN 0.4 MG SL SUBL: 0.4000 mg | SUBLINGUAL_TABLET | SUBLINGUAL | Status: DC | PRN

## 2019-04-07 NOTE — Progress Notes (Addendum)
PROGRESS NOTE    [name]  MGQ:[id number] DOB: [birth year] DOA: 03/29/2019 PCP: Rusty Aus, MD    Brief Narrative:  Gurleen Larrivee is a 85 y.o. female with history of diabetes mellitus type 2, chronic diastolic dysfunction, hypertension had an injury to have both feet when and heavy object fell onto it about 10 days ago.  The feet started swelling become erythematous with wound and had followed up with her primary care physician who prescribed antibiotics.  Despite taking which patient's wound has been getting worse.  Labs were done which showed worsening renal function and patient was instructed to come to the ER.  Patient states about 2 to 3 weeks ago patient did have some diarrhea but nothing further from that.  Denies any vomiting or abdominal pain chest pain or shortness of breath.  X-rays done of the feet on November 13 showed swelling of the right foot with second toe proximal phalanx fracture.  ED Course: In the ER labs reveal creatinine 1.9 with a lactate of 3.2 creatinine is increased from baseline of around 1.1 in September 2020.  Other labs show WBC of 10.4 globin 12.3 platelets 350.  COVID-19 test is pending blood cultures were obtained started on empiric antibiotics fluids and admitted for cellulitis of the both lower extremity with acute renal failure.  Interim History: -11/18: ABI: showed severe right lower extremity arterial occlusive disease at rest. Left ABI may be artifactually elevated due to vascular noncompressibility, underestimating degree of disease. -11/19: consulted [facility], Dr. Delana Meyer, pending recommendation -11/20: k 5.2. pt refused Lokelma -->11/21 K 4.5 with IVF treatment. D/C'ed IVF -11/21: narrow down antibiotics from vancomycin and cefepime to Rocephin - 11/24: K 5.7, 10 g of Lokelma is given - 11/25: foot dressings are changed by [facility], Dr. Delana Meyer - 11/26: has chest pressure in AM. EKG no significant change  Subjective: Patient still has severe  bilateral foot pain, particularlly on the right foot, which is constant, sharp .No fever or chills. No nausea, vomiting, diarrhea or abdominal pain. She report chest pressure feeling this AM.  Assessment & Plan:   Principal Problem:   Cellulitis of both lower extremities Active Problems:   Essential (primary) hypertension   Persistent atrial fibrillation (HCC)   Diabetes mellitus type 2, controlled, with complications (HCC)   ARF (acute renal failure) (HCC)   Chronic diastolic CHF (congestive heart failure) (HCC)   PVD (peripheral vascular disease) (HCC)   Hyperkalemia   Cellulitis of foot, right   Failure of outpatient treatment   Cellulitis of both lower extremities: patient has failed outpatient oral antibiotics for cellulitis. Note that patient's x-rays did show second toe proximal phalanx fracture. ABI showed severe right lower extremity arterial occlusive disease at rest. Left ABI may be artifactually elevated due to vascular noncompressibility, underestimating degree of disease. CRP  0.9 and ESR 35. The left foot lesion is improving. The right foot still has full-thickness tissue loss across the dorsum of the right foot, locally looks severe and nonhealing. Though pt has no fever or leukocytosis, given her severe PAD and DM, will continue to treat pt IV antibiotics. -changed vanco and cefepime to rocephin 11/21, will continue rocephin -->stop on 11/28 AM  -Follow-up blood culture (so far no growth) -wound care on board  PVD: as shown by ABI. [facility] has discussed several times about possible intervention, but pt still cannot make final decision. Per Dr. Delana Meyer, decision is made on 11/23 that pt will be followed up in his office, no  procedure will be performed in hospital. -f/u with [facility] as outpt  AKI: resolved  Lab Results  Component Value Date   CREATININE 0.97 04/07/2019   CREATININE 0.82 04/06/2019   CREATININE 0.87 04/05/2019   Hypertension: - holding hyzarr due to AKI -  Continue beta-blockers, metoprolol - added amlodipine 5 mg daily  11/26 - will not resume her spironolactone since patient he is redeveloped hyperkalemia - As needed IV hydralazine  History of A. fib mentioned in the chart.  - Presently only on aspirin and beta-blocker.  Diabetes mellitus type 2, controlled, with complications: Hemoglobin A1c was 6.32 months ago. -Levemir 5 unit in AM and 10 Units PM -SSI  Hyperkalemia: resolved.  -BMP in am  Chest pressure: Very atypical.  EKG has no significant change.  Initial troponin negative. -Aspirin -on Metoprolol -prn NGT -Troponin x3    DVT prophylaxis: sq heparin Code Status: full Family Communication: not at bedside.  Patient states that you do need to call my son, make my own medical decision. Disposition Plan: inpt.  ABI showed severe right lower extremity arterial occlusive disease at rest. The right foot still has full-thickness tissue loss across the dorsum of the right foot, locally looks severe and nonhealing. Though pt has no fever or leukocytosis, given her severe PAD and DM, will continue to treat pt with IV antibiotics today. [facility].   Consultants:   none  Procedures:    Antimicrobials:  Anti-infectives (From admission, onward)   Start     Dose/Rate Route Frequency Ordered Stop   04/07/19 0800  cefTRIAXone (ROCEPHIN) 1 g in sodium chloride 0.9 % 100 mL IVPB     1 g 200 mL/hr over 30 Minutes Intravenous Every 24 hours 04/06/19 1309 04/09/19 0759   04/03/19 0900  cefTRIAXone (ROCEPHIN) 1 g in sodium chloride 0.9 % 100 mL IVPB  Status:  Discontinued     1 g 200 mL/hr over 30 Minutes Intravenous Every 24 hours 04/02/19 1709 04/06/19 1149   04/01/19 1400  vancomycin (VANCOCIN) IVPB 750 mg/150 ml premix  Status:  Discontinued     750 mg 150 mL/hr over 60 Minutes Intravenous Every 24 hours 04/01/19 1219 04/02/19 1730   03/31/19 1200  ceFEPIme (MAXIPIME) 2 g in sodium chloride 0.9 % 100 mL IVPB   Status:  Discontinued     2 g 200 mL/hr over 30 Minutes Intravenous Every 12 hours 03/31/19 0955 04/02/19 1708   03/31/19 0600  vancomycin (VANCOCIN) IVPB 1000 mg/200 mL premix  Status:  Discontinued     1,000 mg 200 mL/hr over 60 Minutes Intravenous Every 36 hours 03/31/19 0558 04/01/19 1219   03/30/19 2000  ceFEPIme (MAXIPIME) 2 g in sodium chloride 0.9 % 100 mL IVPB  Status:  Discontinued     2 g 200 mL/hr over 30 Minutes Intravenous Every 24 hours 03/30/19 0124 03/31/19 0955   03/29/19 2145  vancomycin (VANCOCIN) 1,750 mg in sodium chloride 0.9 % 500 mL IVPB     1,750 mg 250 mL/hr over 120 Minutes Intravenous  Once 03/29/19 2143 03/30/19 0035   03/29/19 1915  ceFEPIme (MAXIPIME) 2 g in sodium chloride 0.9 % 100 mL IVPB     2 g 200 mL/hr over 30 Minutes Intravenous  Once 03/29/19 1901 03/29/19 2001   03/29/19 1915  vancomycin (VANCOCIN) IVPB 1000 mg/200 mL premix  Status:  Discontinued     1,000 mg 200 mL/hr over 60 Minutes Intravenous  Once 03/29/19 1901 03/29/19 2143  Objective: Vitals:   04/07/19 0420 04/07/19 0729 04/07/19 1251 04/07/19 1419  BP: (!) 198/78 (!) 167/76 (!) 183/83 (!) 158/58  Pulse: 71 60 70 83  Resp: 18  18   Temp: 97.6 F (36.4 C)  97.8 F (36.6 C)   TempSrc: Oral  Oral   SpO2: 98%  98% 98%  Weight:      Height:        Intake/Output Summary (Last 24 hours) at 04/07/2019 1644 Last data filed at 04/07/2019 1502 Gross per 24 hour  Intake 580 ml  Output 750 ml  Net -170 ml   Filed Weights   04/02/19 0500 04/05/19 0500 04/07/19 0416  Weight: 88.9 kg 86.7 kg 92.4 kg    Examination:  Physical Exam:  General: Not in acute distress HEENT: PERRL, EOMI, no scleral icterus, No JVD or bruit Cardiac: S1/S2, RRR, gallops or rubs Pulm: Clear to auscultation bilaterally. No rales, wheezing, rhonchi or rubs. Abd: Soft, nondistended, nontender, no rebound pain, no organomegaly, BS present Ext: has bilateral foot swelling, erythema and tenderness,  with black blisters in both feet. The left foot lesion and blister are improving and aer in healing process, but the right foot still has full-thickness tissue loss across the dorsum of the right foot, locally looks severe and nonhealing.   Musculoskeletal: No joint deformities, erythema, or stiffness, ROM full Skin: No rashes.  Neuro: Alert and oriented X3, cranial nerves II-XII grossly intact, moves all extremeties normally.  Psych: Patient is not psychotic, no suicidal or hemocidal ideation.    Data Reviewed: I have personally reviewed following labs and imaging studies  CBC: Recent Labs  Lab 04/03/19 0453 04/04/19 0346 04/05/19 0420 04/06/19 0357 04/07/19 0525  WBC 7.2 7.2 7.8 8.0 6.6  HGB 10.6* 10.1* 10.5* 9.9* 9.8*  HCT 31.0* 30.1* 30.5* 28.4* 28.1*  MCV 96.0 98.7 95.6 95.0 94.9  PLT 281 274 272 280 315   Basic Metabolic Panel: Recent Labs  Lab 04/02/19 0634 04/03/19 0453 04/04/19 0346 04/05/19 0420 04/06/19 0357 04/07/19 0525  NA 139 138 140 138 138 139  K 4.5 4.2 4.6 5.7* 4.6 4.5  CL 106 106 110 107 104 104  CO2 _0 GLUCOSE 107* 120* 152* 137* 151* 140*  BUN 36* 29* 29* 25* 24* 26*  CREATININE 1.01* 0.91 0.89 0.87 0.82 0.97  CALCIUM 9.1 9.0 9.1 9.3 9.1 9.1  MG 1.9  --   --   --   --   --    GFR: Estimated Creatinine Clearance: 48.6 mL/min (by C-G formula based on SCr of 0.97 mg/dL). Liver Function Tests: No results for input(s): AST, ALT, ALKPHOS, BILITOT, PROT, ALBUMIN in the last 168 hours. No results for input(s): LIPASE, AMYLASE in the last 168 hours. No results for input(s): AMMONIA in the last 168 hours. Coagulation Profile: No results for input(s): INR, PROTIME in the last 168 hours. Cardiac Enzymes: No results for input(s): CKTOTAL, CKMB, CKMBINDEX, TROPONINI in the last 168 hours. BNP (last 3 results) No results for input(s): PROBNP in the last 8760 hours. HbA1C: No results for input(s): HGBA1C in the last 72  hours. CBG: Recent Labs  Lab 04/06/19 1646 04/06/19 2148 04/07/19 0813 04/07/19 1252 04/07/19 1622  GLUCAP 227* 212* 125* 138* 240*   Lipid Profile: No results for input(s): CHOL, HDL, LDLCALC, TRIG, CHOLHDL, LDLDIRECT in the last 72 hours. Thyroid Function Tests: No results for input(s): TSH, T4TOTAL, FREET4, T3FREE, THYROIDAB in the last 72 hours. Anemia  Panel: No results for input(s): VITAMINB12, FOLATE, FERRITIN, TIBC, IRON, RETICCTPCT in the last 72 hours. Sepsis Labs: No results for input(s): PROCALCITON, LATICACIDVEN in the last 168 hours.  Recent Results (from the past 240 hour(s))  Blood Culture (routine x 2)     Status: None   Collection Time: 03/29/19  7:06 PM   Specimen: BLOOD  Result Value Ref Range Status   Specimen Description BLOOD RIGHT ANTECUBITAL  Final   Special Requests   Final    BOTTLES DRAWN AEROBIC AND ANAEROBIC Blood Culture adequate volume   Culture   Final    NO GROWTH 5 DAYS Performed at [facility], [address] [place], [place] [address]    Report Status 04/03/2019 FINAL  Final  Blood Culture (routine x 2)     Status: None   Collection Time: 03/29/19  7:17 PM   Specimen: BLOOD  Result Value Ref Range Status   Specimen Description BLOOD BLOOD RIGHT ARM  Final   Special Requests   Final    BOTTLES DRAWN AEROBIC AND ANAEROBIC Blood Culture adequate volume   Culture   Final    NO GROWTH 5 DAYS Performed at [facility], [address], [place], [place] [address]    Report Status 04/03/2019 FINAL  Final  SARS CORONAVIRUS 2 (TAT 6-24 HRS) Nasopharyngeal Nasopharyngeal Swab     Status: None   Collection Time: 03/29/19  7:17 PM   Specimen: Nasopharyngeal Swab  Result Value Ref Range Status   SARS Coronavirus 2 NEGATIVE NEGATIVE Final    Comment: (NOTE) SARS-CoV-2 target nucleic acids are NOT DETECTED. The SARS-CoV-2 RNA is generally detectable in upper and lower respiratory specimens during the acute phase of  infection. Negative results do not preclude SARS-CoV-2 infection, do not rule out co-infections with other pathogens, and should not be used as the sole basis for treatment or other patient management decisions. Negative results must be combined with clinical observations, patient history, and epidemiological information. The expected result is Negative. Fact Sheet for Patients: SugarRoll.be Fact Sheet for Healthcare Providers: https://www.woods-mathews.com/ This test is not yet approved or cleared by the [place] FDA and  has been authorized for detection and/or diagnosis of SARS-CoV-2 by FDA under an Emergency Use Authorization (EUA). This EUA will remain  in effect (meaning this test can be used) for the duration of the COVID-19 declaration under Section 56 4(b)(1) of the Act, 21 U.S.C. section 360bbb-3(b)(1), unless the authorization is terminated or revoked sooner. Performed at [facility], [place] [address], [place], [place] [address]      Radiology Studies: No results found.      Scheduled Meds:  amLODipine  5 mg Oral Daily   aspirin  325 mg Oral BID   dextrose  1 ampule Intravenous Once   docusate sodium  100 mg Oral BID   heparin  5,000 Units Subcutaneous Q8H   insulin aspart  0-9 Units Subcutaneous TID WC   insulin aspart  10 Units Intravenous Once   insulin detemir  10 Units Subcutaneous QHS   insulin detemir  5 Units Subcutaneous q morning - 10a   metoprolol tartrate  12.5 mg Oral BID   senna-docusate  1 tablet Oral BID   Continuous Infusions:  sodium chloride Stopped (04/01/19 1503)   cefTRIAXone (ROCEPHIN)  IV Stopped (04/07/19 1008)     LOS: 9 days    Time spent: 30 min     Ivor Costa, DO [facility] PAGER is on [employer]  If 7PM-7AM, please contact night-coverage [email or web address] Password [facility] 04/07/2019, 4:44 PM

## 2019-04-07 NOTE — Progress Notes (Addendum)
RN notified MD pain is complaining of chest discomfort. Per MD Okay for RN to order troponin and EKG. RN will continue to monitor.

## 2019-04-08 LAB — GLUCOSE, CAPILLARY
Glucose-Capillary: 149 mg/dL — ABNORMAL HIGH (ref 70–99)
Glucose-Capillary: 191 mg/dL — ABNORMAL HIGH (ref 70–99)
Glucose-Capillary: 229 mg/dL — ABNORMAL HIGH (ref 70–99)

## 2019-04-08 MED ORDER — AMLODIPINE BESYLATE 5 MG PO TABS: 5.0000 mg | ORAL_TABLET | Freq: Every day | ORAL | Status: DC

## 2019-04-08 MED ORDER — AMOXICILLIN-POT CLAVULANATE 875-125 MG PO TABS: 1.0000 | ORAL_TABLET | Freq: Two times a day (BID) | ORAL | 0 refills | Status: AC

## 2019-04-08 MED ORDER — GLYCERIN (LAXATIVE) 2 G RE SUPP
1.0000 | Freq: Once | RECTAL | Status: AC
  Administered 2019-04-08: 12:00:00 1 via RECTAL
  Filled 2019-04-08: qty 1

## 2019-04-08 NOTE — Progress Notes (Addendum)
Patient requesting glycerin suppository. Dr Sarajane Jews gave an order for this. Patient refused norvasc and coverage insulin. Dr Sarajane Jews aware

## 2019-04-08 NOTE — Progress Notes (Signed)
Pt is ready to be D/C today. Contacted Seth Bake at [facility] and left her a VM. Will continue to f/u to assist with the D/C plan.

## 2019-04-08 NOTE — Progress Notes (Signed)
[facility] main number at [phone] and left 2nd VM for Seth Bake to return CM call. Informed her that pt is ready to be D/C today.

## 2019-04-08 NOTE — Progress Notes (Addendum)
PROGRESS NOTE    [name]  GGY:[id number] DOB: 1931/04/14 DOA: 03/29/2019 PCP: Rusty Aus, MD    Brief Narrative:  [name] is a 85 y.o. female with history of diabetes mellitus type 2, chronic diastolic dysfunction, hypertension had an injury to have both feet when and heavy object fell onto it about 10 days ago.  The feet started swelling become erythematous with wound and had followed up with her primary care physician who prescribed antibiotics.  Despite taking which patient's wound has been getting worse.  Labs were done which showed worsening renal function and patient was instructed to come to the ER.  Patient states about 2 to 3 weeks ago patient did have some diarrhea but nothing further from that.  Denies any vomiting or abdominal pain chest pain or shortness of breath.  X-rays done of the feet on November 13 showed swelling of the right foot with second toe proximal phalanx fracture.  ED Course: In the ER labs reveal creatinine 1.9 with a lactate of 3.2 creatinine is increased from baseline of around 1.1 in September 2020.  Other labs show WBC of 10.4 globin 12.3 platelets 350.  COVID-19 test is pending blood cultures were obtained started on empiric antibiotics fluids and admitted for cellulitis of the both lower extremity with acute renal failure.  Interim History: -11/18: ABI: showed severe right lower extremity arterial occlusive disease at rest. Left ABI may be artifactually elevated due to vascular noncompressibility, underestimating degree of disease. -11/19: consulted [facility], Dr. Delana Meyer, pending recommendation -11/20: k 5.2. pt refused Lokelma -->11/21 K 4.5 with IVF treatment. D/C'ed IVF -11/21: narrow down antibiotics from vancomycin and cefepime to Rocephin - 11/23: [facility], Dr. Delana Meyer evaluated pt and discussed with patient again about choice of treatment. Pt seems to be not able to make her decision yet. Dr. Delana Meyer is concerned that  difficulty in healing wounds  with significant atherosclerotic occlusive disease since pt does not wish to commit to angiography at this point. Dr. Delana Meyer offered that she follow-up with in the office.  Subjective:  Pt states that her foot pain has no significant improvement, particularly on the right foot. Still has severe bilateral constant and sharp foot pain. No fever or chills. No CP, SOB, no nausea, vomiting, diarrhea or abdominal pain.  Assessment & Plan:   Principal Problem:   Cellulitis of both lower extremities Active Problems:   Essential (primary) hypertension   Persistent atrial fibrillation (HCC)   Diabetes mellitus type 2, controlled, with complications (HCC)   ARF (acute renal failure) (HCC)   Chronic diastolic CHF (congestive heart failure) (HCC)   PVD (peripheral vascular disease) (HCC)   Hyperkalemia   Cellulitis of foot, right   Failure of outpatient treatment   Cellulitis of both lower extremities: patient has failed outpatient oral antibiotics for cellulitis. Note that patient's x-rays did show second toe proximal phalanx fracture. ABI showed severe right lower extremity arterial occlusive disease at rest. Left ABI may be artifactually elevated due to vascular noncompressibility, underestimating degree of disease. CRP  0.9 and ESR 35. Very difficult situation,  pt does not wish to commit to angiography at this point and therefore will not be able to do any intervention treatment for her PVD, her would will likely not be healing smoothly.  -will changed vanco and cefepime to rocephin 11/21, will continue rocephin  -Follow-up blood culture (so far no growth) -wound care on borad  PVD: as shown by ABI. [facility] has discussed several times about  possible intervention, but pt still cannot make final decision.[facility], Dr. Delana Meyer evaluated pt again and discussed with patient again about choice of treatment. Pt seems to be not able to make her decision yet. Dr. Delana Meyer is concerning about the difficulty in  healing wounds with significant atherosclerotic occlusive disease since pt does not wish to commit to angiography at this point. Dr. Delana Meyer offered that she follow-up with in the office.  AKI: improved -f/u BMP  Hypertension: - holding ARB due to renal failure.  - Continue beta-blockers. -  As needed IV hydralazine  History of A. fib mention in the chart.  - Presently only on aspirin and beta-blocker.  Diabetes mellitus type 2, controlled, with complications: Hemoglobin A1c was 6.32 months ago. -Levemir 5 unit in AM and 10 Units PM -SSI  Hyperkalemia: resolved. -BMP in am   DVT prophylaxis: sq heparin Code Status: full Family Communication:  Disposition Plan: inpt. Pt still has severe foot pain, still has non-healing wound in both feet, not improved significantly. ABI showed severe right lower extremity arterial occlusive disease at rest, need further IV antibiotic treatment.    Consultants:   none  Procedures:    Antimicrobials:   Examination:  T=97.9, HR 80, RR 18, Bp 165/77  Physical Exam:  General: Not in acute distress HEENT: PERRL, EOMI, no scleral icterus, No JVD or bruit Cardiac: S1/S2, RRR, gallops or rubs Pulm: Clear to auscultation bilaterally. No rales, wheezing, rhonchi or rubs. Abd: Soft, nondistended, nontender, no rebound pain, no organomegaly, BS present Ext: has bilateral foot swelling, erythema and severe tenderness, with black blisters and wound in both feet. Musculoskeletal: No joint deformities, erythema, or stiffness, ROM full Skin: No rashes.  Neuro: Alert and oriented X3, cranial nerves II-XII grossly intact, moves all extremeties normally.  Psych: Patient is not psychotic, no suicidal or hemocidal ideation.    Data Reviewed: I have personally reviewed following labs and imaging studies  CBC: WBC 7.2, Hgb 10.1, Platelet 274  BMP Na 140, K 4.0, Cl 100, Co2 23, BUN 29, Cre 0.89  GFR: Estimated Creatinine Clearance: 49.8 mL/min  (by C-G formula based on SCr of 0.97 mg/dL). Liver Function Tests: No results for input(s): AST, ALT, ALKPHOS, BILITOT, PROT, ALBUMIN in the last 168 hours. No results for input(s): LIPASE, AMYLASE in the last 168 hours. No results for input(s): AMMONIA in the last 168 hours. Coagulation Profile: No results for input(s): INR, PROTIME in the last 168 hours. Cardiac Enzymes: No results for input(s): CKTOTAL, CKMB, CKMBINDEX, TROPONINI in the last 168 hours. BNP (last 3 results) No results for input(s): PROBNP in the last 8760 hours. HbA1C: No results for input(s): HGBA1C in the last 72 hours. CBG:  Lipid Profile: No results for input(s): CHOL, HDL, LDLCALC, TRIG, CHOLHDL, LDLDIRECT in the last 72 hours. Thyroid Function Tests: No results for input(s): TSH, T4TOTAL, FREET4, T3FREE, THYROIDAB in the last 72 hours. Anemia Panel: No results for input(s): VITAMINB12, FOLATE, FERRITIN, TIBC, IRON, RETICCTPCT in the last 72 hours. Sepsis Labs: No results for input(s): PROCALCITON, LATICACIDVEN in the last 168 hours.  Recent Results (from the past 240 hour(s))  Blood Culture (routine x 2)     Status: None   Collection Time: 03/29/19  7:06 PM   Specimen: BLOOD  Result Value Ref Range Status   Specimen Description BLOOD RIGHT ANTECUBITAL  Final   Special Requests   Final    BOTTLES DRAWN AEROBIC AND ANAEROBIC Blood Culture adequate volume   Culture   Final  NO GROWTH 5 DAYS Performed at [facility], [address] [place], [place] [address]    Report Status 04/03/2019 FINAL  Final  Blood Culture (routine x 2)     Status: None   Collection Time: 03/29/19  7:17 PM   Specimen: BLOOD  Result Value Ref Range Status   Specimen Description BLOOD BLOOD RIGHT ARM  Final   Special Requests   Final    BOTTLES DRAWN AEROBIC AND ANAEROBIC Blood Culture adequate volume   Culture   Final    NO GROWTH 5 DAYS Performed at [facility], [address] [place], [place]  [address]    Report Status 04/03/2019 FINAL  Final  SARS CORONAVIRUS 2 (TAT 6-24 HRS) Nasopharyngeal Nasopharyngeal Swab     Status: None   Collection Time: 03/29/19  7:17 PM   Specimen: Nasopharyngeal Swab  Result Value Ref Range Status   SARS Coronavirus 2 NEGATIVE NEGATIVE Final    Comment: (NOTE) SARS-CoV-2 target nucleic acids are NOT DETECTED. The SARS-CoV-2 RNA is generally detectable in upper and lower respiratory specimens during the acute phase of infection. Negative results do not preclude SARS-CoV-2 infection, do not rule out co-infections with other pathogens, and should not be used as the sole basis for treatment or other patient management decisions. Negative results must be combined with clinical observations, patient history, and epidemiological information. The expected result is Negative. Fact Sheet for Patients: SugarRoll.be Fact Sheet for Healthcare Providers: https://www.woods-mathews.com/ This test is not yet approved or cleared by the [place] FDA and  has been authorized for detection and/or diagnosis of SARS-CoV-2 by FDA under an Emergency Use Authorization (EUA). This EUA will remain  in effect (meaning this test can be used) for the duration of the COVID-19 declaration under Section 56 4(b)(1) of the Act, 21 U.S.C. section 360bbb-3(b)(1), unless the authorization is terminated or revoked sooner. Performed at [facility], [place] [address], [place],  [address]      Radiology Studies: No results found.      Scheduled Meds:  amLODipine  5 mg Oral Daily   aspirin  325 mg Oral BID   dextrose  1 ampule Intravenous Once   docusate sodium  100 mg Oral BID   glycerin adult  1 suppository Rectal Once   heparin  5,000 Units Subcutaneous Q8H   insulin aspart  0-9 Units Subcutaneous TID WC   insulin aspart  10 Units Intravenous Once   insulin detemir  10 Units Subcutaneous QHS   insulin  detemir  5 Units Subcutaneous q morning - 10a   metoprolol tartrate  12.5 mg Oral BID   senna-docusate  1 tablet Oral BID   Continuous Infusions:  sodium chloride Stopped (04/01/19 1503)     LOS: 10 days    Time spent: 30 min     Ivor Costa, DO [facility] PAGER is on [place]  If 7PM-7AM, please contact night-coverage [email or web address] Password [email or web address] 04/08/2019, 11:36 AM     PROGRESS NOTE    [name]  GYK:[id number] DOB: 1930-08-07 DOA: 03/29/2019 PCP: Rusty Aus, MD    Brief Narrative:  Dyneisha Murchison is a 85 y.o. female with history of diabetes mellitus type 2, chronic diastolic dysfunction, hypertension had an injury to have both feet when and heavy object fell onto it about 10 days ago.  The feet started swelling become erythematous with wound and had followed up with her primary care physician who prescribed antibiotics.  Despite taking which patient's wound  has been getting worse.  Labs were done which showed worsening renal function and patient was instructed to come to the ER.  Patient states about 2 to 3 weeks ago patient did have some diarrhea but nothing further from that.  Denies any vomiting or abdominal pain chest pain or shortness of breath.  X-rays done of the feet on November 13 showed swelling of the right foot with second toe proximal phalanx fracture.  ED Course: In the ER labs reveal creatinine 1.9 with a lactate of 3.2 creatinine is increased from baseline of around 1.1 in September 2020.  Other labs show WBC of 10.4 globin 12.3 platelets 350.  COVID-19 test is pending blood cultures were obtained started on empiric antibiotics fluids and admitted for cellulitis of the both lower extremity with acute renal failure.  Interim History: -11/18: ABI: showed severe right lower extremity arterial occlusive disease at rest. Left ABI may be artifactually elevated due to vascular noncompressibility, underestimating degree of disease. -11/19: consulted [facility],  Dr. Delana Meyer, pending recommendation -11/20: k 5.2. pt refused Lokelma -->11/21 K 4.5 with IVF treatment. D/C'ed IVF -11/21: narrow down antibiotics from vancomycin and cefepime to Rocephin  Subjective: Patient still has severe bilateral constant and sharp bilateral foot pain, pt is worse upon slight touch. No fever or chills. No CP, SOB, no nausea, vomiting, diarrhea or abdominal pain.  Assessment & Plan:   Principal Problem:   Cellulitis of both lower extremities Active Problems:   Essential (primary) hypertension   Persistent atrial fibrillation (HCC)   Diabetes mellitus type 2, controlled, with complications (HCC)   ARF (acute renal failure) (HCC)   Chronic diastolic CHF (congestive heart failure) (HCC)   PVD (peripheral vascular disease) (HCC)   Hyperkalemia   Cellulitis of foot, right   Failure of outpatient treatment   Cellulitis of both lower extremities: patient has failed outpatient oral antibiotics for cellulitis. Note that patient's x-rays did show second toe proximal phalanx fracture. ABI showed severe right lower extremity arterial occlusive disease at rest. Left ABI may be artifactually elevated due to vascular noncompressibility, underestimating degree of disease. CRP  0.9 and ESR 35.   -will changed vanco and cefepime to rocephin 11/21, will continue rocephin  -Follow-up blood culture (so far no growth) -wound care on borad  PVD: as shown by ABI. [facility] has discussed several times about possible intervention, but pt still cannot make final decision. -consulted [facility], planing decision, need more discuss with pt per [facility], possible CTA needed  AKI: improving  Hypertension: - holding ARB due to renal failure.  - Continue beta-blockers. -  As needed IV hydralazine  History of A. fib mention in the chart.  - Presently only on aspirin and beta-blocker.  Diabetes mellitus type 2, controlled, with complications: Hemoglobin A1c was 6.32 months ago. -Levemir 5 unit in AM and  10 Units PM -SSI  Hyperkalemia: resolved. K=4.2 -BMP in am   DVT prophylaxis: sq heparin Code Status: full Family Communication:  Disposition Plan: inpt. Pt still has severe foot pain. ABI showed severe right lower extremity arterial occlusive disease at rest, need further work up and [facility] consult pending   Consultants:   none  Procedures:    Antimicrobials:   Examination:  T=98.6, HR 63, RR 18, Bp 134/66  Physical Exam:  General: Not in acute distress HEENT: PERRL, EOMI, no scleral icterus, No JVD or bruit Cardiac: S1/S2, RRR, gallops or rubs Pulm: Clear to auscultation bilaterally. No rales, wheezing, rhonchi or rubs. Abd: Soft, nondistended, nontender,  no rebound pain, no organomegaly, BS present Ext: has bilateral foot swelling, erythema and tenderness, with black blisters and wound in both feet. Musculoskeletal: No joint deformities, erythema, or stiffness, ROM full Skin: No rashes.  Neuro: Alert and oriented X3, cranial nerves II-XII grossly intact, moves all extremeties normally.  Psych: Patient is not psychotic, no suicidal or hemocidal ideation.    Data Reviewed: I have personally reviewed following labs and imaging studies  CBC: WBC 72, Hgb 10.6, Platelet 281  BMP Na 139, K 4.5, Cl 106, Co2 25, BUN 36, Cre 1.01, Mg 1.9  GFR: Estimated Creatinine Clearance: 49.8 mL/min (by C-G formula based on SCr of 0.97 mg/dL). Liver Function Tests: No results for input(s): AST, ALT, ALKPHOS, BILITOT, PROT, ALBUMIN in the last 168 hours. No results for input(s): LIPASE, AMYLASE in the last 168 hours. No results for input(s): AMMONIA in the last 168 hours. Coagulation Profile: No results for input(s): INR, PROTIME in the last 168 hours. Cardiac Enzymes: No results for input(s): CKTOTAL, CKMB, CKMBINDEX, TROPONINI in the last 168 hours. BNP (last 3 results) No results for input(s): PROBNP in the last 8760 hours. HbA1C: No results for input(s): HGBA1C in the last  72 hours. CBG:  Lipid Profile: No results for input(s): CHOL, HDL, LDLCALC, TRIG, CHOLHDL, LDLDIRECT in the last 72 hours. Thyroid Function Tests: No results for input(s): TSH, T4TOTAL, FREET4, T3FREE, THYROIDAB in the last 72 hours. Anemia Panel: No results for input(s): VITAMINB12, FOLATE, FERRITIN, TIBC, IRON, RETICCTPCT in the last 72 hours. Sepsis Labs: No results for input(s): PROCALCITON, LATICACIDVEN in the last 168 hours.  Recent Results (from the past 240 hour(s))  Blood Culture (routine x 2)     Status: None   Collection Time: 03/29/19  7:06 PM   Specimen: BLOOD  Result Value Ref Range Status   Specimen Description BLOOD RIGHT ANTECUBITAL  Final   Special Requests   Final    BOTTLES DRAWN AEROBIC AND ANAEROBIC Blood Culture adequate volume   Culture   Final    NO GROWTH 5 DAYS Performed at [facility], [address], [place], [place] [address]    Report Status 04/03/2019 FINAL  Final  Blood Culture (routine x 2)     Status: None   Collection Time: 03/29/19  7:17 PM   Specimen: BLOOD  Result Value Ref Range Status   Specimen Description BLOOD BLOOD RIGHT ARM  Final   Special Requests   Final    BOTTLES DRAWN AEROBIC AND ANAEROBIC Blood Culture adequate volume   Culture   Final    NO GROWTH 5 DAYS Performed at [facility], [address] [place], [place] [address]    Report Status 04/03/2019 FINAL  Final  SARS CORONAVIRUS 2 (TAT 6-24 HRS) Nasopharyngeal Nasopharyngeal Swab     Status: None   Collection Time: 03/29/19  7:17 PM   Specimen: Nasopharyngeal Swab  Result Value Ref Range Status   SARS Coronavirus 2 NEGATIVE NEGATIVE Final    Comment: (NOTE) SARS-CoV-2 target nucleic acids are NOT DETECTED. The SARS-CoV-2 RNA is generally detectable in upper and lower respiratory specimens during the acute phase of infection. Negative results do not preclude SARS-CoV-2 infection, do not rule out co-infections with other pathogens, and should not  be used as the sole basis for treatment or other patient management decisions. Negative results must be combined with clinical observations, patient history, and epidemiological information. The expected result is Negative. Fact Sheet for Patients: SugarRoll.be Fact Sheet for Healthcare Providers:  https://www.woods-mathews.com/ This test is not yet approved or cleared by the [place] and  has been authorized for detection and/or diagnosis of SARS-CoV-2 by FDA under an Emergency Use Authorization (EUA). This EUA will remain  in effect (meaning this test can be used) for the duration of the COVID-19 declaration under Section 56 4(b)(1) of the Act, 21 U.S.C. section 360bbb-3(b)(1), unless the authorization is terminated or revoked sooner. Performed at [facility], [place] [address], [place], [place] [address]      Radiology Studies: No results found.      Scheduled Meds:  amLODipine  5 mg Oral Daily   aspirin  325 mg Oral BID   dextrose  1 ampule Intravenous Once   docusate sodium  100 mg Oral BID   glycerin adult  1 suppository Rectal Once   heparin  5,000 Units Subcutaneous Q8H   insulin aspart  0-9 Units Subcutaneous TID WC   insulin aspart  10 Units Intravenous Once   insulin detemir  10 Units Subcutaneous QHS   insulin detemir  5 Units Subcutaneous q morning - 10a   metoprolol tartrate  12.5 mg Oral BID   senna-docusate  1 tablet Oral BID   Continuous Infusions:  sodium chloride Stopped (04/01/19 1503)     LOS: 10 days    Time spent: 30 min     Ivor Costa, DO [facility] PAGER is on [place]  If 7PM-7AM, please contact night-coverage [email or web address] Password [facility] 04/08/2019, 11:37 AM

## 2019-04-08 NOTE — Progress Notes (Addendum)
PROGRESS NOTE    [name]  QZR:[id number] DOB: 03/28/1931 DOA: 03/29/2019 PCP: Rusty Aus, MD    Brief Narrative:  [name] is a 85 y.o. female with history of diabetes mellitus type 2, chronic diastolic dysfunction, hypertension had an injury to have both feet when and heavy object fell onto it about 10 days ago.  The feet started swelling become erythematous with wound and had followed up with her primary care physician who prescribed antibiotics.  Despite taking which patient's wound has been getting worse.  Labs were done which showed worsening renal function and patient was instructed to come to the ER.  Patient states about 2 to 3 weeks ago patient did have some diarrhea but nothing further from that.  Denies any vomiting or abdominal pain chest pain or shortness of breath.  X-rays done of the feet on November 13 showed swelling of the right foot with second toe proximal phalanx fracture.  ED Course: In the ER labs reveal creatinine 1.9 with a lactate of 3.2 creatinine is increased from baseline of around 1.1 in September 2020.  Other labs show WBC of 10.4 globin 12.3 platelets 350.  COVID-19 test is pending blood cultures were obtained started on empiric antibiotics fluids and admitted for cellulitis of the both lower extremity with acute renal failure.  Interim History: -11/18: ABI: showed severe right lower extremity arterial occlusive disease at rest. Left ABI may be artifactually elevated due to vascular noncompressibility, underestimating degree of disease. -11/19: consulted [facility], Dr. Delana Meyer, pending recommendation -11/20: k 5.2. pt refused Lokelma -->11/21 K 4.5 with IVF treatment. D/C'ed IVF -11/21: narrow down antibiotics from vancomycin and cefepime to Rocephin  Subjective: Patient still has severe bilateral constant and sharp bilateral foot pain, pt is worse upon slight touch. No fever or chills. No CP, SOB, no nausea, vomiting, diarrhea or abdominal pain.   Assessment & Plan:   Principal Problem:   Cellulitis of both lower extremities Active Problems:   Essential (primary) hypertension   Persistent atrial fibrillation (HCC)   Diabetes mellitus type 2, controlled, with complications (HCC)   ARF (acute renal failure) (HCC)   Chronic diastolic CHF (congestive heart failure) (HCC)   PVD (peripheral vascular disease) (HCC)   Hyperkalemia   Cellulitis of foot, right   Failure of outpatient treatment   Cellulitis of both lower extremities: patient has failed outpatient oral antibiotics for cellulitis. Note that patient's x-rays did show second toe proximal phalanx fracture. ABI showed severe right lower extremity arterial occlusive disease at rest. Left ABI may be artifactually elevated due to vascular noncompressibility, underestimating degree of disease. CRP  0.9 and ESR 35.   -will changed vanco and cefepime to rocephin 11/21, will continue rocephin  -Follow-up blood culture (so far no growth) -wound care on borad  PVD: as shown by ABI. [facility] has discussed several times about possible intervention, but pt still cannot make final decision. -consulted [facility], planing decision, need more discuss with pt per [facility], possible CTA needed  AKI: improving  Hypertension: - holding ARB due to renal failure.  - Continue beta-blockers. -  As needed IV hydralazine  History of A. fib mention in the chart.  - Presently only on aspirin and beta-blocker.  Diabetes mellitus type 2, controlled, with complications: Hemoglobin A1c was 6.32 months ago. -Levemir 5 unit in AM and 10 Units PM -SSI  Hyperkalemia: resolved. K=4.2 -BMP in am   DVT prophylaxis: sq heparin Code Status: full Family Communication:  Disposition Plan: inpt.  Pt still has severe foot pain. ABI showed severe right lower extremity arterial occlusive disease at rest, need further work up and [facility] consult pending   Consultants:   none  Procedures:    Antimicrobials:    Examination:  T=98.6, HR 63, RR 18, Bp 134/66  Physical Exam:  General: Not in acute distress HEENT: PERRL, EOMI, no scleral icterus, No JVD or bruit Cardiac: S1/S2, RRR, gallops or rubs Pulm: Clear to auscultation bilaterally. No rales, wheezing, rhonchi or rubs. Abd: Soft, nondistended, nontender, no rebound pain, no organomegaly, BS present Ext: has bilateral foot swelling, erythema and tenderness, with black blisters and wound in both feet. Musculoskeletal: No joint deformities, erythema, or stiffness, ROM full Skin: No rashes.  Neuro: Alert and oriented X3, cranial nerves II-XII grossly intact, moves all extremeties normally.  Psych: Patient is not psychotic, no suicidal or hemocidal ideation.    Data Reviewed: I have personally reviewed following labs and imaging studies  CBC: WBC 72, Hgb 10.6, Platelet 281  BMP Na 139, K 4.5, Cl 106, Co2 25, BUN 36, Cre 1.01, Mg 1.9  GFR: Estimated Creatinine Clearance: 49.8 mL/min (by C-G formula based on SCr of 0.97 mg/dL). Liver Function Tests: No results for input(s): AST, ALT, ALKPHOS, BILITOT, PROT, ALBUMIN in the last 168 hours. No results for input(s): LIPASE, AMYLASE in the last 168 hours. No results for input(s): AMMONIA in the last 168 hours. Coagulation Profile: No results for input(s): INR, PROTIME in the last 168 hours. Cardiac Enzymes: No results for input(s): CKTOTAL, CKMB, CKMBINDEX, TROPONINI in the last 168 hours. BNP (last 3 results) No results for input(s): PROBNP in the last 8760 hours. HbA1C: No results for input(s): HGBA1C in the last 72 hours. CBG:  Lipid Profile: No results for input(s): CHOL, HDL, LDLCALC, TRIG, CHOLHDL, LDLDIRECT in the last 72 hours. Thyroid Function Tests: No results for input(s): TSH, T4TOTAL, FREET4, T3FREE, THYROIDAB in the last 72 hours. Anemia Panel: No results for input(s): VITAMINB12, FOLATE, FERRITIN, TIBC, IRON, RETICCTPCT in the last 72 hours. Sepsis Labs: No results  for input(s): PROCALCITON, LATICACIDVEN in the last 168 hours.  Recent Results (from the past 240 hour(s))  Blood Culture (routine x 2)     Status: None   Collection Time: 03/29/19  7:06 PM   Specimen: BLOOD  Result Value Ref Range Status   Specimen Description BLOOD RIGHT ANTECUBITAL  Final   Special Requests   Final    BOTTLES DRAWN AEROBIC AND ANAEROBIC Blood Culture adequate volume   Culture   Final    NO GROWTH 5 DAYS Performed at [facility], [address], [place], [place] [address]    Report Status 04/03/2019 FINAL  Final  Blood Culture (routine x 2)     Status: None   Collection Time: 03/29/19  7:17 PM   Specimen: BLOOD  Result Value Ref Range Status   Specimen Description BLOOD BLOOD RIGHT ARM  Final   Special Requests   Final    BOTTLES DRAWN AEROBIC AND ANAEROBIC Blood Culture adequate volume   Culture   Final    NO GROWTH 5 DAYS Performed at [facility], [address] [place], [place] [address]    Report Status 04/03/2019 FINAL  Final  SARS CORONAVIRUS 2 (TAT 6-24 HRS) Nasopharyngeal Nasopharyngeal Swab     Status: None   Collection Time: 03/29/19  7:17 PM   Specimen: Nasopharyngeal Swab  Result Value Ref Range Status   SARS Coronavirus 2 NEGATIVE NEGATIVE Final  Comment: (NOTE) SARS-CoV-2 target nucleic acids are NOT DETECTED. The SARS-CoV-2 RNA is generally detectable in upper and lower respiratory specimens during the acute phase of infection. Negative results do not preclude SARS-CoV-2 infection, do not rule out co-infections with other pathogens, and should not be used as the sole basis for treatment or other patient management decisions. Negative results must be combined with clinical observations, patient history, and epidemiological information. The expected result is Negative. Fact Sheet for Patients: SugarRoll.be Fact Sheet for Healthcare Providers: https://www.woods-mathews.com/  This test is not yet approved or cleared by the [place] FDA and  has been authorized for detection and/or diagnosis of SARS-CoV-2 by FDA under an Emergency Use Authorization (EUA). This EUA will remain  in effect (meaning this test can be used) for the duration of the COVID-19 declaration under Section 56 4(b)(1) of the Act, 21 U.S.C. section 360bbb-3(b)(1), unless the authorization is terminated or revoked sooner. Performed at [facility], [place] [address], [place], [place] [address]      Radiology Studies: No results found.      Scheduled Meds: . amLODipine  5 mg Oral Daily  . aspirin  325 mg Oral BID  . dextrose  1 ampule Intravenous Once  . docusate sodium  100 mg Oral BID  . glycerin adult  1 suppository Rectal Once  . heparin  5,000 Units Subcutaneous Q8H  . insulin aspart  0-9 Units Subcutaneous TID WC  . insulin aspart  10 Units Intravenous Once  . insulin detemir  10 Units Subcutaneous QHS  . insulin detemir  5 Units Subcutaneous q morning - 10a  . metoprolol tartrate  12.5 mg Oral BID  . senna-docusate  1 tablet Oral BID   Continuous Infusions: . sodium chloride Stopped (04/01/19 1503)     LOS: 10 days    Time spent: 30 min     Ivor Costa, DO [facility] PAGER is on AMION  If 7PM-7AM, please contact night-coverage [email or web address] Password [facility] 04/08/2019, 11:25 AM

## 2019-04-08 NOTE — Discharge Summary (Signed)
Physician Discharge Summary  Eunie Yake [id number] DOB: [birth year] DOA: 03/29/2019  PCP: Rusty Aus, MD  Admit date: 03/29/2019 Discharge date: 04/08/2019  Recommendations for Outpatient Follow-up:   Atherosclerotic occlusive disease bilateral lower extremities with associated open wounds of the dorsum status post trauma, bilateral cellulitis. --Close follow-up with vascular surgery.  See further details below.  Acute kidney injury.  Diuretic and ARB held. -- Resolved.  Held diuretic and ARB on discharge.  May be able to resume these in the near future.  Right second proximal phalanx fracture present on admission, secondary to injury from a metal walker.  Bilateral lower extremity wounds secondary to cellulitis, bulla. -- Single layer of xeroform over the dorsal foot wounds, top with dry dressing, secure with conform gauze.  No topical care to the right great toe wound.  Normocytic anemia, stable since admission. --Suggest outpatient evaluation if clinically indicated.  Essential hypertension --Continue metoprolol, amlodipine.    Spironolactone hold given mild hyperkalemia.  Could consider restarting as an outpatient and following BMP closely.  History of chronic diastolic CHF.  Diuretics currently on hold.  Monitor daily weights.  Resume diuretic as clinically indicated.  Atrial fibrillation not on anticoagulation --Continue aspirin and beta-blocker --Not on anticoagulation, defer to her primary cardiologist Dr. Ronnald Ramp in regard to this   Follow-up Information    Schnier, Dolores Lory, MD Follow up in 10 day(s).   Specialties: Vascular Surgery, Cardiology, Radiology, Vascular Surgery Why: right leg arterial duplex See Schnier only OK to double book Contact information: [place] [address] [phone]            Discharge Diagnoses: Principal diagnosis is #1 1. Bilateral lower extremity cellulitis status post injury to feet from metal  walker 2. Atherosclerotic occlusive disease bilateral lower extremities with associated open wounds of the dorsum status post trauma, bilateral cellulitis. 3. Acute kidney injury 4. Right second proximal phalanx fracture present on admission, secondary to injury from a metal walker 5. Normocytic anemia 6. Essential hypertension 7. Atrial fibrillation not on anticoagulation 8. CAD 9. Diabetes mellitus  Discharge Condition: improved Disposition: SNF short-term  Diet recommendation: heart healthy, diabetic diet  Filed Weights   04/05/19 0500 04/07/19 0416 04/08/19 0549  Weight: 86.7 kg 92.4 kg 97.1 kg    History of present illness:  85 year old woman resident [facility], PMH including diabetes mellitus type 2, chronic diastolic CHF who sustained an injury to both feet when a walker fell on them.  She developed erythema and was prescribed antibiotics by PCP but despite compliance her condition worsened.  Outpatient laboratory studies also showed worsening renal function and she was sent to the emergency department for further evaluation.  Admitted for bilateral lower extremity cellulitis failed outpatient therapy.    Hospital Course:  She improved with antibiotics in regard to infection.  ABI revealed atherosclerotic occlusive disease bilateral lower extremities and she was seen by vascular surgery.  Recommendation was to proceed with angiogram, this was delayed by acute kidney injury.  Renal function improved but the patient continued to have reservations about further evaluation, this was discussed by vascular surgery with the patient in detail. Patient wished to focus on antibiotics and follow-up in the outpatient setting. Left foot wound noted to be improved, right foot wound continued to have full-thickness tissue loss, looks to be nonhealing.  Vascular surgery strongly recommended angiography with consideration given to intervention.  Specifically it was discussed with  the patient that wound healing on the right was  not to be expected without intervention.  This was discussed multiple times by vascular surgery and by medicine.  I discussed this again with the patient today.  Her infection appears to be essentially resolved.  She will complete a few more days of oral antibiotics.  With Danae Chen RN at bedside, I had a long discussion with the patient, she asked many detailed questions and demonstrated clear understanding of the infection as well as poor wound healing.  She demonstrated understanding that the surgeon does not feel her wound to heal on the right without intervention, nevertheless she does not wish to pursue this at this point.  I discussed with her that my concern that she would be rehospitalized for infection or worsening of her right foot without intervention and she demonstrated understanding of this as well.  I discussed with her that antibiotics are not thought to be curative in her right foot.  Bilateral lower extremity cellulitis status post injury to feet from metal walker --Completed IV antibiotics today.  4 more days of oral antibiotics.  Atherosclerotic occlusive disease bilateral lower extremities with associated open wounds of the dorsum status post trauma, bilateral cellulitis. --Vascular surgery believes left foot will heal.  Primary focus has been on the right foot which given ABI less than 0.5 in association with multiple ulcers is felt to not be able to heal without revascularization.  Vascular surgery discussed in detail with patient and the plan is to follow-up in the outpatient setting with an arterial duplex to better map pattern of occlusive disease.  Vascular surgery continues to feel angiography would be appropriate however the patient will not consider this at this point.  "Given her tissue loss I believe she is placing her own limb in jeopardy and have reiterated the importance of revascularization for wound healing and preventing limb  loss."  Acute kidney injury.  Diuretic and ARB held. -- Resolved.  Hold diuretic and ARB on discharge.  Right second proximal phalanx fracture present on admission, secondary to injury from a metal walker.  Bilateral lower extremity wounds secondary to cellulitis, bulla. -- Single layer of xeroform over the dorsal foot wounds, top with dry dressing, secure with conform gauze.  No topical care to the right great toe wound.  Normocytic anemia, stable since admission. --Suggest outpatient evaluation if clinically indicated.  Essential hypertension --Continue metoprolol, amlodipine.  Willnot resume spironolactone given mild hyperkalemia.  Hyzaar held at this point secondary to AKI.  This may be able to be restarted in the outpatient setting.  Atrial fibrillation not on anticoagulation --Continue aspirin and beta-blocker  CAD -- Stable.  No recurrent chest pain.  EKG and troponins were unremarkable.  No further evaluation suggested.  Diabetes mellitus --Stable.  Continue Levemir 5 units in the morning and 10 units in the evening.  Atypical chest pressure.  EKG no acute changes.  Troponins negative.  No further evaluation suggested.  Significant Hospital Events    11/17 admitted for bilateral lower extremity cellulitis, acute kidney injury  11/18 vascular surgery consulted for peripheral arterial disease bilateral lower extremities  11/18 wound care nurse consult  11/19 vascular surgery consult  Consults:   Vascular surgery  Procedures:   ABI showed severe right lower extremity arterial occlusive disease at rest.  Left ABI may be artifactually elevated secondary to vascular noncompressibility underestimate degree of disease.  Significant Diagnostic Tests:   Bilateral lower extremity ABI: Right ABI 0.48, left ABI 1.19.  Severe right lower extremity arterial occlusive disease at rest.  Left ABI may be artifactually elevated due to vascular noncompressibility  underestimating degree of disease.  Micro Data:   SARS Covid negative  Blood cultures no growth, final  Antimicrobials:   Vancomycin 11/17 > 11/21  Cefepime 11/17 > 11/21  Ceftriaxone 11/22 > 11/27  Augmentin 11/28 > 12/1  Today's assessment: S: Reports today that bilateral feet have shown significant improvement over time.  Some tenderness in the right compared to the left.  No chest pain today.  No new issues. O: Vitals:  Vitals:   04/08/19 0703 04/08/19 0938  BP: (!) 179/70 (!) 186/77  Pulse: 67 62  Resp:    Temp:    SpO2:      Constitutional:  Appears calm, comfortable. Cardiovascular.  Regular rate and rhythm.  No murmur, rub or gallop.  No lower extremity edema. Respiratory.  Clear to auscultation bilaterally.  No wheezes, rales or rhonchi.  Normal respiratory effort. Abdomen.  Soft, nontender. Skin.  Bilateral lower extremities visualized.  There is very mild residual erythema over the left dorsum of the foot in mild erythema over the dorsum of the right foot.  There is full-thickness tissue loss on the dorsum of the right foot.  The feet are warm and dry.  There is no evidence of cellulitis at this point. Psychiatric.  Grossly normal mood and affect.  Speech fluent and appropriate.  Asks many good questions, demonstrates clear understanding of issues presented in regard to infection, wound healing and vascular surgery recommendations.  Clearly has capacity in good understanding.  CBG stable EKG from this a.m. atrial fibrillation, septal MI, old compared to previous study 11/24, no acute changes seen.  Discharge Instructions   Allergies as of 04/08/2019      Reactions   Alendronate Other (See Comments)   Cramping of the jaw   Iodinated Diagnostic Agents Other (See Comments)   Brother has allergy and she wants not to receive it    Statins Other (See Comments)   Fatigue/muscle weakness Fatigue/muscle weakness Fatigue/muscle weakness      Medication List     STOP taking these medications   doxycycline 100 MG tablet Commonly known as: VIBRA-TABS   furosemide 20 MG tablet Commonly known as: LASIX   ibuprofen 200 MG tablet Commonly known as: ADVIL   losartan-hydrochlorothiazide 100-12.5 MG tablet Commonly known as: HYZAAR   spironolactone 25 MG tablet Commonly known as: ALDACTONE     TAKE these medications   amLODipine 5 MG tablet Commonly known as: NORVASC Take 1 tablet (5 mg total) by mouth daily. Start taking on: April 09, 2019   amoxicillin-clavulanate 875-125 MG tablet Commonly known as: Augmentin Take 1 tablet by mouth 2 (two) times daily for 4 days.   aspirin 325 MG EC tablet Take 325 mg by mouth 2 (two) times daily.   Levemir FlexPen 100 UNIT/ML Pen Generic drug: Insulin Detemir Inject 5-10 Units into the skin 2 (two) times daily. 5 units QAM 10 units QPM   metFORMIN 1000 MG tablet Commonly known as: GLUCOPHAGE 1,000 mg 2 (two) times daily with a meal.   metoprolol tartrate 25 MG tablet Commonly known as: LOPRESSOR Take 12.5 mg by mouth 2 (two) times daily.      Allergies  Allergen Reactions  . Alendronate Other (See Comments)    Cramping of the jaw  . Iodinated Diagnostic Agents Other (See Comments)    Brother has allergy and she wants not to receive it   . Statins Other (See Comments)  Fatigue/muscle weakness Fatigue/muscle weakness Fatigue/muscle weakness    The results of significant diagnostics from this hospitalization (including imaging, microbiology, ancillary and laboratory) are listed below for reference.    Significant Diagnostic Studies: Dg Lumbar Spine Complete  Result Date: 03/25/2019 CLINICAL DATA:  Worsening lumbar spine pain. EXAM: LUMBAR SPINE - COMPLETE 4+ VIEW COMPARISON:  02/12/2015 FINDINGS: Bones are diffusely demineralized. Loss of disc space again noted T12-L1, L1-2, L2-3 loss of disc height evident L5-S1. Endplate degeneration noted at multiple levels. SI joints  unremarkable. Atherosclerotic calcification noted in the wall of the abdominal aorta. Coarse calcification in the central pelvis likely uterine fibroid. IMPRESSION: Stable.  No acute bony abnormality. Diffuse degenerative disc disease in the lumbar spine. Electronically Signed   By: Misty Stanley M.D.   On: 03/25/2019 12:57   US Arterial Abi (screening Lower Extremity)  Result Date: 03/30/2019 CLINICAL DATA:  Bilateral vascular ulcers, skin color change. Hypertension, diabetes. EXAM: NONINVASIVE PHYSIOLOGIC VASCULAR STUDY OF BILATERAL LOWER EXTREMITIES TECHNIQUE: Evaluation of both lower extremities were performed at rest, including calculation of ankle-brachial indices with single level Doppler, pressure and pulse volume recording. COMPARISON:  None. FINDINGS: Right ABI:  0.48 Left ABI:  1.19 Right Lower Extremity:  Monophasic arterial waveforms at the ankle. Left Lower Extremity:  Biphasic arterial waveforms at the ankle. IMPRESSION: Severe right lower extremity arterial occlusive disease at rest. Left ABI may be artifactually elevated due to vascular noncompressibility, underestimating degree of disease. Electronically Signed   By: Lucrezia Europe M.D.   On: 03/30/2019 12:18   Dg Foot Complete Left  Result Date: 03/25/2019 CLINICAL DATA:  Foot injury with pain and swelling. EXAM: LEFT FOOT - COMPLETE 3+ VIEW COMPARISON:  None. FINDINGS: Diffuse osteopenia. Soft tissue swelling noted dorsal midfoot. No acute fracture identified. No subluxation or dislocation. Degenerative changes noted in the MTP joint of the great toe. IMPRESSION: Negative. Electronically Signed   By: Misty Stanley M.D.   On: 03/25/2019 13:11   Dg Foot Complete Right  Result Date: 03/25/2019 CLINICAL DATA:  Swelling and pain after injury. EXAM: RIGHT FOOT COMPLETE - 3+ VIEW COMPARISON:  None. FINDINGS: Three views study shows fracture in the proximal aspect of the second toe proximal phalanx. No definite extension to the articular  surface. Bones are diffusely demineralized. Soft tissue swelling noted at the midfoot. IMPRESSION: Midfoot soft tissue swelling with fracture noted in the proximal aspect of the second toe proximal phalanx. No evidence for fracture line extension to the proximal articular surface. Electronically Signed   By: Misty Stanley M.D.   On: 03/25/2019 13:00    Microbiology: Recent Results (from the past 240 hour(s))  Blood Culture (routine x 2)     Status: None   Collection Time: 03/29/19  7:06 PM   Specimen: BLOOD  Result Value Ref Range Status   Specimen Description BLOOD RIGHT ANTECUBITAL  Final   Special Requests   Final    BOTTLES DRAWN AEROBIC AND ANAEROBIC Blood Culture adequate volume   Culture   Final    NO GROWTH 5 DAYS Performed at [facility], [address] [place], [place] [address]    Report Status 04/03/2019 FINAL  Final  Blood Culture (routine x 2)     Status: None   Collection Time: 03/29/19  7:17 PM   Specimen: BLOOD  Result Value Ref Range Status   Specimen Description BLOOD BLOOD RIGHT ARM  Final   Special Requests   Final    BOTTLES DRAWN AEROBIC AND ANAEROBIC  Blood Culture adequate volume   Culture   Final    NO GROWTH 5 DAYS Performed at [facility], [address] [place], [place] [address]    Report Status 04/03/2019 FINAL  Final  SARS CORONAVIRUS 2 (TAT 6-24 HRS) Nasopharyngeal Nasopharyngeal Swab     Status: None   Collection Time: 03/29/19  7:17 PM   Specimen: Nasopharyngeal Swab  Result Value Ref Range Status   SARS Coronavirus 2 NEGATIVE NEGATIVE Final    Comment: (NOTE) SARS-CoV-2 target nucleic acids are NOT DETECTED. The SARS-CoV-2 RNA is generally detectable in upper and lower respiratory specimens during the acute phase of infection. Negative results do not preclude SARS-CoV-2 infection, do not rule out co-infections with other pathogens, and should not be used as the sole basis for treatment or other patient management  decisions. Negative results must be combined with clinical observations, patient history, and epidemiological information. The expected result is Negative. Fact Sheet for Patients: SugarRoll.be Fact Sheet for Healthcare Providers: https://www.woods-mathews.com/ This test is not yet approved or cleared by the [place] FDA and  has been authorized for detection and/or diagnosis of SARS-CoV-2 by FDA under an Emergency Use Authorization (EUA). This EUA will remain  in effect (meaning this test can be used) for the duration of the COVID-19 declaration under Section 56 4(b)(1) of the Act, 21 U.S.C. section 360bbb-3(b)(1), unless the authorization is terminated or revoked sooner. Performed at [facility], [place] [address] [place], [place] [address]      Labs: Basic Metabolic Panel: Recent Labs  Lab 04/02/19 [phone] 04/03/19 0453 04/04/19 0346 04/05/19 0420 04/06/19 0357 04/07/19 0525  NA 139 138 140 138 138 139  K 4.5 4.2 4.6 5.7* 4.6 4.5  CL 106 106 110 107 104 104  CO2 25 24 23 26 25 26   GLUCOSE 107* 120* 152* 137* 151* 140*  BUN 36* 29* 29* 25* 24* 26*  CREATININE 1.01* 0.91 0.89 0.87 0.82 0.97  CALCIUM 9.1 9.0 9.1 9.3 9.1 9.1  MG 1.9  --   --   --   --   --    CBC: Recent Labs  Lab 04/03/19 0453 04/04/19 0346 04/05/19 0420 04/06/19 0357 04/07/19 0525  WBC 7.2 7.2 7.8 8.0 6.6  HGB 10.6* 10.1* 10.5* 9.9* 9.8*  HCT 31.0* 30.1* 30.5* 28.4* 28.1*  MCV 96.0 98.7 95.6 95.0 94.9  PLT 281 274 272 280 259    CBG: Recent Labs  Lab 04/07/19 1252 04/07/19 1622 04/07/19 2113 04/08/19 0830 04/08/19 1140  GLUCAP 138* 240* 134* 149* 191*    Principal Problem:   Cellulitis of both lower extremities Active Problems:   Essential (primary) hypertension   Persistent atrial fibrillation (HCC)   Diabetes mellitus type 2, controlled, with complications (HCC)   ARF (acute renal failure) (HCC)   Chronic diastolic CHF  (congestive heart failure) (HCC)   PVD (peripheral vascular disease) (HCC)   Hyperkalemia   Cellulitis of foot, right   Failure of outpatient treatment   Time coordinating discharge: 50 minutes  Signed:  Murray Hodgkins, MD  [facility]  04/08/2019, 12:53 PM

## 2019-04-08 NOTE — Progress Notes (Signed)
Report called to Timmothy Sours at [facility]. EMS called for transport

## 2019-04-08 NOTE — Care Management Important Message (Signed)
Important Message  Patient Details  Name: [name] MRN: [id number] Date of Birth: 09-Jul-1930   Medicare Important Message Given:  Yes     Dannette Barbara 04/08/2019, 12:20 PM

## 2019-04-08 NOTE — Progress Notes (Signed)
Discharge instructions reviewed with the patient. EMS is here to transport the patient

## 2019-04-08 NOTE — Progress Notes (Signed)
Received call from [place] with [facility]. She reports that pt can be D/C today to room # 326. RN to call report at [phone]. Completed medical necessity form for the ambulance. RN made aware of above.

## 2019-04-11 DIAGNOSIS — E11621 Type 2 diabetes mellitus with foot ulcer: Secondary | ICD-10-CM

## 2019-04-11 DIAGNOSIS — I739 Peripheral vascular disease, unspecified: Secondary | ICD-10-CM

## 2019-04-11 DIAGNOSIS — I5032 Chronic diastolic (congestive) heart failure: Secondary | ICD-10-CM

## 2019-04-11 DIAGNOSIS — L03119 Cellulitis of unspecified part of limb: Secondary | ICD-10-CM

## 2019-04-11 DIAGNOSIS — E1121 Type 2 diabetes mellitus with diabetic nephropathy: Secondary | ICD-10-CM

## 2019-04-11 DIAGNOSIS — N1831 Chronic kidney disease, stage 3a: Secondary | ICD-10-CM

## 2019-04-11 DIAGNOSIS — I10 Essential (primary) hypertension: Secondary | ICD-10-CM

## 2019-04-12 DIAGNOSIS — L03115 Cellulitis of right lower limb: Secondary | ICD-10-CM

## 2019-04-18 DIAGNOSIS — L03119 Cellulitis of unspecified part of limb: Secondary | ICD-10-CM | POA: Diagnosis not present

## 2019-04-20 ENCOUNTER — Other Ambulatory Visit: Payer: Self-pay

## 2019-04-20 ENCOUNTER — Encounter: Payer: No Typology Code available for payment source | Attending: Internal Medicine | Admitting: Internal Medicine

## 2019-04-20 DIAGNOSIS — N289 Disorder of kidney and ureter, unspecified: Secondary | ICD-10-CM | POA: Diagnosis not present

## 2019-04-20 DIAGNOSIS — I10 Essential (primary) hypertension: Secondary | ICD-10-CM | POA: Diagnosis not present

## 2019-04-20 DIAGNOSIS — E11621 Type 2 diabetes mellitus with foot ulcer: Secondary | ICD-10-CM | POA: Diagnosis not present

## 2019-04-20 DIAGNOSIS — L97518 Non-pressure chronic ulcer of other part of right foot with other specified severity: Secondary | ICD-10-CM | POA: Diagnosis not present

## 2019-04-20 DIAGNOSIS — E1142 Type 2 diabetes mellitus with diabetic polyneuropathy: Secondary | ICD-10-CM | POA: Insufficient documentation

## 2019-04-20 DIAGNOSIS — L97528 Non-pressure chronic ulcer of other part of left foot with other specified severity: Secondary | ICD-10-CM | POA: Insufficient documentation

## 2019-04-20 DIAGNOSIS — I4891 Unspecified atrial fibrillation: Secondary | ICD-10-CM | POA: Diagnosis not present

## 2019-04-20 DIAGNOSIS — E1152 Type 2 diabetes mellitus with diabetic peripheral angiopathy with gangrene: Secondary | ICD-10-CM | POA: Insufficient documentation

## 2019-04-20 DIAGNOSIS — Z794 Long term (current) use of insulin: Secondary | ICD-10-CM | POA: Diagnosis not present

## 2019-04-20 DIAGNOSIS — L03115 Cellulitis of right lower limb: Secondary | ICD-10-CM | POA: Diagnosis not present

## 2019-04-21 NOTE — Progress Notes (Signed)
[name], [name] Visit Report for 04/20/2019 Allergy List Details Patient Name: [name], [name] [facility] Date of Service: 04/20/2019 8:15 AM Medical Record Number: [id number] Patient Account Number: [id number] Date of Birth/Sex: 24-Aug-1930 (85 y.o. F) Treating [name]: [name] Primary Care [name]: [name] Other Clinician: Referring [name]: [name] Treating [name]/Extender: [name] Weeks in Treatment: 0 Allergies Active Allergies No Known Drug Allergies Allergy Notes Electronic Signature(s) Signed: 04/21/2019 4:28:48 PM By: [name] Entered By: [name] on 04/20/2019 08:45:15 [name], [name] -------------------------------------------------------------------------------- Arrival Information Details Patient Name: [name] Date of Service: 04/20/2019 8:15 AM Medical Record Number: [id number] Patient Account Number: [id number] Date of Birth/Sex: [birth year] (85 y.o. F) Treating [name]: [name] Primary Care Tylar Merendino: [name] Other Clinician: Referring [name]: [name] Treating [name]/Extender: [name] in Treatment: 0 Visit Information Patient Arrived: Wheel Chair Arrival Time: 08:34 Accompanied By: caregiver Transfer Assistance: EasyPivot Patient Lift Patient Identification Verified: Yes Secondary Verification Process Yes Completed: Patient Has Alerts: Yes Patient Alerts: ABI L 1.19 R 0.48 03/2019 Electronic Signature(s) Signed: 04/20/2019 10:49:05 AM By: [name] Entered By: [name] on 04/20/2019 10:49:04 [name], [name] -------------------------------------------------------------------------------- Clinic Level of Care Assessment Details Patient Name: [name] Date of Service: 04/20/2019 8:15 AM Medical Record Number: [id number] Patient Account Number: [id number] Date of Birth/Sex: May 21, 1930 (85 y.o. F) Treating [name]: [name] Primary Care [name]: [name]  Other Clinician: Referring [name]: [name] Treating [name]/Extender: [name] in Treatment: 0 Clinic Level of Care Assessment Items TOOL 2 Quantity Score []  - Use when only an EandM is performed on the INITIAL visit 0 ASSESSMENTS - Nursing Assessment / Reassessment X - General Physical Exam (combine w/ comprehensive assessment (listed just below) when 1 20 performed on new pt. evals) X- 1 25 Comprehensive Assessment (HX, ROS, Risk Assessments, Wounds Hx, etc.) ASSESSMENTS - Wound and Skin Assessment / Reassessment []  - Simple Wound Assessment / Reassessment - one wound 0 X- 2 5 Complex Wound Assessment / Reassessment - multiple wounds []  - 0 Dermatologic / Skin Assessment (not related to wound area) ASSESSMENTS - Ostomy and/or Continence Assessment and Care []  - Incontinence Assessment and Management 0 []  - 0 Ostomy Care Assessment and Management (repouching, etc.) PROCESS - Coordination of Care X - Simple Patient / Family Education for ongoing care 1 15 []  - 0 Complex (extensive) Patient / Family Education for ongoing care X- 1 10 Staff obtains Programmer, systems, Records, Test Results / Process Orders []  - 0 Staff telephones HHA, Nursing Homes / Clarify orders / etc []  - 0 Routine Transfer to another Facility (non-emergent condition) []  - 0 Routine Hospital Admission (non-emergent condition) []  - 0 New Admissions / Biomedical engineer / Ordering NPWT, Apligraf, etc. []  - 0 Emergency Hospital Admission (emergent condition) X- 1 10 Simple Discharge Coordination []  - 0 Complex (extensive) Discharge Coordination PROCESS - Special Needs []  - Pediatric / Minor Patient Management 0 []  - 0 Isolation Patient Management [name], [name] []  - 0 Hearing / Language / Visual special needs []  - 0 Assessment of Community assistance (transportation, D/C planning, etc.) []  - 0 Additional assistance / Altered mentation []  - 0 Support Surface(s)  Assessment (bed, cushion, seat, etc.) INTERVENTIONS - Wound Cleansing / Measurement X - Wound Imaging (photographs - any number of wounds) 1 5 []  - 0 Wound Tracing (instead of photographs) []  - 0 Simple Wound Measurement - one wound X- 2 5 Complex Wound Measurement - multiple wounds []  - 0 Simple Wound Cleansing -  one wound X- 2 5 Complex Wound Cleansing - multiple wounds INTERVENTIONS - Wound Dressings []  - Small Wound Dressing one or multiple wounds 0 X- 2 15 Medium Wound Dressing one or multiple wounds []  - 0 Large Wound Dressing one or multiple wounds []  - 0 Application of Medications - injection INTERVENTIONS - Miscellaneous []  - External ear exam 0 []  - 0 Specimen Collection (cultures, biopsies, blood, body fluids, etc.) []  - 0 Specimen(s) / Culture(s) sent or taken to Lab for analysis []  - 0 Patient Transfer (multiple staff / Harrel Lemon Lift / Similar devices) []  - 0 Simple Staple / Suture removal (25 or less) []  - 0 Complex Staple / Suture removal (26 or more) []  - 0 Hypo / Hyperglycemic Management (close monitor of Blood Glucose) []  - 0 Ankle / Brachial Index (ABI) - do not check if billed separately Has the patient been seen at the hospital within the last three years: Yes Total Score: 145 Level Of Care: New/Established - Level 4 Electronic Signature(s) Signed: 04/20/2019 5:38:26 PM By: [name], BSN, [name], CWS, [name], BSN Entered By: [name], BSN, [name], CWS, [name] on 04/20/2019 09:53:16 [name], [name] -------------------------------------------------------------------------------- Encounter Discharge Information Details Patient Name: [name] Date of Service: 04/20/2019 8:15 AM Medical Record Number: [id number] Patient Account Number: [id number] Date of Birth/Sex: 1930-06-21 (85 y.o. F) Treating [name]: [name] Primary Care Emilina Smarr: [name] Other Clinician: Referring [name]: [name] Treating Karmyn Lowman/Extender: [name] in  Treatment: 0 Encounter Discharge Information Items Discharge Condition: Stable Ambulatory Status: Wheelchair Discharge Destination: Skilled Nursing Facility Telephoned: No Orders Sent: Yes Transportation: Private Auto Accompanied By: caregiver Schedule Follow-up Appointment: Yes Clinical Summary of Care: Electronic Signature(s) Signed: 04/20/2019 5:38:26 PM By: [name], BSN, [name], CWS, [name], BSN Entered By: [name], BSN, [name], CWS, [name] on 04/20/2019 10:14:21 [name], Akane ([id number]) -------------------------------------------------------------------------------- Lower Extremity Assessment Details Patient Name: [name] Date of Service: 04/20/2019 8:15 AM Medical Record Number: [id number] Patient Account Number: [id number] Date of Birth/Sex: 1930-11-18 (85 y.o. F) Treating [name]: [name] Primary Care Dariyon Urquilla: [name] Other Clinician: Referring Everlie Eble: [name] Treating Rudean Icenhour/Extender: [name] in Treatment: 0 Edema Assessment Assessed: [Left: No] [Right: No] [Left: Edema] [Right: :] Calf Left: Right: Point of Measurement: 32 cm From Medial Instep 36 cm 36.5 cm Ankle Left: Right: Point of Measurement: 9 cm From Medial Instep 26.2 cm 26.2 cm Vascular Assessment Pulses: Dorsalis Pedis Palpable: [Left:No] [Right:No] Doppler Audible: [Left:Yes] [Right:Yes] Posterior Tibial Palpable: [Left:No Yes] [Right:No Yes] Electronic Signature(s) Signed: 04/21/2019 4:28:48 PM By: [name] Entered By: [name] on 04/20/2019 09:15:39 Caney, Woodlands ([id number]) -------------------------------------------------------------------------------- Multi Wound Chart Details Patient Name: [name] Date of Service: 04/20/2019 8:15 AM Medical Record Number: [id number] Patient Account Number: [id number] Date of Birth/Sex: [birth year] (85 y.o. F) Treating [name]: [name] Primary Care Elissia Spiewak: [name] Other Clinician: Referring Mavin Dyke: [name] Treating Joriel Streety/Extender: [name] in Treatment: 0 Vital Signs Height(in): 69 Pulse(bpm): 65 Weight(lbs): 195 Blood Pressure(mmHg): 135/87 Body Mass Index(BMI): 29 Temperature(F): 98.3 Respiratory Rate 16 (breaths/min): Photos: [N/A:N/A] Wound Location: Left Foot - Dorsal Right Foot - Dorsal N/A Wounding Event: Trauma Trauma N/A Primary Etiology: Cellulitis Cellulitis N/A Comorbid History: Cataracts, Arrhythmia, Cataracts, Arrhythmia, N/A Congestive Heart Failure, Congestive Heart Failure, Hypertension, Type II Hypertension, Type II Diabetes, Rheumatoid Arthritis Diabetes, Rheumatoid Arthritis Date Acquired: 03/24/2019 03/24/2019 N/A Weeks of Treatment: 0 0 N/A Wound Status: Open Open N/A Measurements L x W x D 0.5x0.7x0.1 3x2.8x0.1 N/A (cm) Area (cm) :  0.275 6.597 N/A Volume (cm) : 0.027 0.66 N/A % Reduction in Area: 0.00% N/A N/A % Reduction in Volume: 0.00% N/A N/A Classification: Full Thickness Without Unclassifiable N/A Exposed Support Structures Exudate Amount: Medium Small N/A Exudate Type: Serosanguineous Serosanguineous N/A Exudate Color: red, brown red, brown N/A Wound Margin: Flat and Intact Thickened N/A Granulation Amount: Small (1-33%) None Present (0%) N/A Granulation Quality: Pink N/A N/A Necrotic Amount: Large (67-100%) Large (67-100%) N/A Necrotic Tissue: Adherent Slough Eschar N/A Exposed Structures: Fat Layer (Subcutaneous Fascia: No N/A Tissue) Exposed: Yes Fat Layer (Subcutaneous Fascia: No Tissue) Exposed: No Fell, Dezirae ([id number]) Tendon: No Tendon: No Muscle: No Muscle: No Joint: No Joint: No Bone: No Bone: No Epithelialization: None None N/A Treatment Notes Electronic Signature(s) Signed: 04/20/2019 6:09:55 PM By: Linton Ham MD Entered By: Linton Ham on 04/20/2019 10:06:36 Emmons, Jomaira  ([id number]) -------------------------------------------------------------------------------- Multi-Disciplinary Care Plan Details Patient Name: [name] Date of Service: 04/20/2019 8:15 AM Medical Record Number: [id number] Patient Account Number: [id number] Date of Birth/Sex: 08/15/1930 (85 y.o. F) Treating [name]: [name] Primary Care Miley Blanchett: [name] Other Clinician: Referring Rainey Kahrs: [name] Treating Aerionna Moravek/Extender: [name] in Treatment: 0 Active Inactive Necrotic Tissue Nursing Diagnoses: Impaired tissue integrity related to necrotic/devitalized tissue Knowledge deficit related to management of necrotic/devitalized tissue Goals: Necrotic/devitalized tissue will be minimized in the wound bed Date Initiated: 04/20/2019 Target Resolution Date: 05/04/2019 Goal Status: Active Interventions: Assess patient pain level pre-, during and post procedure and prior to discharge Provide education on necrotic tissue and debridement process Treatment Activities: Apply topical anesthetic as ordered : 04/20/2019 Notes: Orientation to the Wound Care Program Nursing Diagnoses: Knowledge deficit related to the wound healing center program Goals: Patient/caregiver will verbalize understanding of the [place] Date Initiated: 04/20/2019 Target Resolution Date: 05/04/2019 Goal Status: Active Interventions: Provide education on orientation to the wound center Notes: Venous Leg Ulcer Nursing Diagnoses: Actual venous Insuffiency (use after diagnosis is confirmed) Goals: Patient/caregiver will verbalize understanding of disease process and disease management VERDERBER, Geralynn ([id number]) Date Initiated: 04/20/2019 Target Resolution Date: 05/04/2019 Goal Status: Active Interventions: Assess peripheral edema status every visit. Notes: Wound/Skin Impairment Nursing Diagnoses: Impaired tissue integrity Goals: Patient/caregiver will verbalize  understanding of skin care regimen Date Initiated: 04/20/2019 Target Resolution Date: 05/04/2019 Goal Status: Active Ulcer/skin breakdown will have a volume reduction of 30% by week 4 Date Initiated: 04/20/2019 Target Resolution Date: 05/18/2019 Goal Status: Active Interventions: Assess ulceration(s) every visit Treatment Activities: Skin care regimen initiated : 04/20/2019 Topical wound management initiated : 04/20/2019 Notes: Electronic Signature(s) Signed: 04/20/2019 5:38:26 PM By: [name], BSN, [name], CWS, [name], BSN Entered By: [name], BSN, [name], CWS, [name] on 04/20/2019 09:37:49 Michiels, Larsen ([id number]) -------------------------------------------------------------------------------- Pain Assessment Details Patient Name: [name] Date of Service: 04/20/2019 8:15 AM Medical Record Number: [id number] Patient Account Number: [id number] Date of Birth/Sex: [birth year] (85 y.o. F) Treating [name]: [name] Primary Care Shonice Wrisley: [name] Other Clinician: Referring Merland Holness: [name] Treating Marques Ericson/Extender: [name] in Treatment: 0 Active Problems Location of Pain Severity and Description of Pain Patient Has Paino Yes Site Locations Rate the pain. Current Pain Level: 3 Character of Pain Describe the Pain: Other: Pressure Pain Management and Medication Current Pain Management: Notes At night 4-5 pain level Electronic Signature(s) Signed: 04/21/2019 4:28:48 PM By: [name] Entered By: [name] on 04/20/2019 08:42:38 Pizzo, Samarrah ([id number]) -------------------------------------------------------------------------------- Patient/Caregiver Education Details Patient Name: [name] Date of Service: 04/20/2019 8:15 AM Medical Record Number: [id number] Patient Account Number:  [id number] Date of Birth/Gender: Jan 21, 1931 (85 y.o. F) Treating [name]: [name] Primary Care Physician: [name] Other Clinician: Referring Physician: Emily [name] Treating Physician/Extender: [name] in Treatment: 0 Education Assessment Education Provided To: Patient Education Topics Provided Elevated Blood Sugar/ Impact on Healing: Handouts: Elevated Blood Sugars: How Do They Affect Wound Healing Methods: Demonstration, Explain/Verbal Responses: State content correctly Safety: Handouts: Safety Methods: Demonstration, Explain/Verbal Responses: State content correctly Tissue Oxygenation: Handouts: Peripheral Arterial Disease and Related Ulcers Methods: Demonstration, Explain/Verbal Responses: State content correctly Welcome To The [place]: Handouts: Welcome To The [place] Methods: Demonstration, Explain/Verbal Responses: State content correctly Electronic Signature(s) Signed: 04/20/2019 5:38:26 PM By: [name], BSN, [name], CWS, [name], BSN Entered By: [name], BSN, [name], CWS, [name] on 04/20/2019 09:42:38 [name], Dajha ([id number]) -------------------------------------------------------------------------------- Wound Assessment Details Patient Name: [name] Date of Service: 04/20/2019 8:15 AM Medical Record Number: [id number] Patient Account Number: [id number] Date of Birth/Sex: 12-May-1931 (85 y.o. F) Treating [name]: [name] Primary Care Jayle Solarz: [name] Other Clinician: Referring Amol Domanski: [name] Treating Kajuan Guyton/Extender: [name] in Treatment: 0 Wound Status Wound Number: 1 Primary Cellulitis Etiology: Wound Location: Left Foot - Dorsal Wound Open Wounding Event: Trauma Status: Date Acquired: 03/24/2019 Comorbid Cataracts, Arrhythmia, Congestive Heart Weeks Of Treatment: 0 History: Failure, Hypertension, Type II Diabetes, Clustered Wound: No Rheumatoid Arthritis Photos Wound Measurements Length: (cm) 0.5 Width: (cm) 0.7 Depth: (cm) 0.1 Area: (cm) 0.275 Volume: (cm) 0.027 % Reduction in Area: 0% % Reduction in Volume: 0% Epithelialization:  None Tunneling: No Undermining: No Wound Description Full Thickness Without Exposed Support Classification: Structures Wound Margin: Flat and Intact Exudate Medium Amount: Exudate Type: Serosanguineous Exudate Color: red, brown Foul Odor After Cleansing: No Slough/Fibrino Yes Wound Bed Granulation Amount: Small (1-33%) Exposed Structure Granulation Quality: Pink Fascia Exposed: No Necrotic Amount: Large (67-100%) Fat Layer (Subcutaneous Tissue) Exposed: Yes Necrotic Quality: Adherent Slough Tendon Exposed: No Muscle Exposed: No Joint Exposed: No Bone Exposed: No Jenning, Ashrita ([id number]) Treatment Notes Wound #1 (Left, Dorsal Foot) Notes left - prisma, right - santyl, gauze, abd and conform Electronic Signature(s) Signed: 04/21/2019 4:28:48 PM By: [name] Entered By: [name] on 04/20/2019 09:20:02 Grudzien, Fionnuala ([id number]) -------------------------------------------------------------------------------- Wound Assessment Details Patient Name: [name] Date of Service: 04/20/2019 8:15 AM Medical Record Number: [id number] Patient Account Number: [id number] Date of Birth/Sex: [birth year] (85 y.o. F) Treating [name]: [name] Primary Care Johnwesley Lederman: [name] Other Clinician: Referring Ulyssa Walthour: [name] Treating Jadarion Halbig/Extender: [name] in Treatment: 0 Wound Status Wound Number: 2 Primary Cellulitis Etiology: Wound Location: Right Foot - Dorsal Wound Open Wounding Event: Trauma Status: Date Acquired: 03/24/2019 Comorbid Cataracts, Arrhythmia, Congestive Heart Weeks Of Treatment: 0 History: Failure, Hypertension, Type II Diabetes, Clustered Wound: No Rheumatoid Arthritis Photos Wound Measurements Length: (cm) 3 % Reduction Width: (cm) 2.8 % Reduction Depth: (cm) 0.1 Epitheliali Area: (cm) 6.597 Tunneling: Volume: (cm) 0.66 Underminin in Area: in Volume: zation: None No g: No Wound Description Classification:  Unclassifiable Foul Odor A Wound Margin: Thickened Slough/Fibr Exudate Amount: Small Exudate Type: Serosanguineous Exudate Color: red, brown fter Cleansing: No ino Yes Wound Bed Granulation Amount: None Present (0%) Exposed Structure Necrotic Amount: Large (67-100%) Fascia Exposed: No Necrotic Quality: Eschar Fat Layer (Subcutaneous Tissue) Exposed: No Tendon Exposed: No Muscle Exposed: No Joint Exposed: No Bone Exposed: No Treatment Notes Struthers, Kaiulani ([id number]) Wound #2 (Right, Dorsal Foot) Notes left - prisma, right - santyl, gauze, abd and conform Electronic Signature(s) Signed: 04/21/2019 4:28:48  PM By: [name] Entered By: [name] on 04/20/2019 09:19:37 Hafley, Shabree ([id number]) -------------------------------------------------------------------------------- [place] Details Patient Name: [name] Date of Service: 04/20/2019 8:15 AM Medical Record Number: [id number] Patient Account Number: [id number] Date of Birth/Sex: 1930-08-20 (85 y.o. F) Treating [name]: [name] Primary Care Dustan Hyams: [name] Other Clinician: Referring Zaevion Parke: [name] Treating Evans Levee/Extender: [name] in Treatment: 0 Vital Signs Time Taken: 08:40 Temperature (F): 98.3 Height (in): 69 Pulse (bpm): 65 Source: Stated Respiratory Rate (breaths/min): 16 Weight (lbs): 195 Blood Pressure (mmHg): 135/87 Source: Stated Reference Range: 80 - 120 mg / dl Body Mass Index (BMI): 28.8 Electronic Signature(s) Signed: 04/21/2019 4:28:48 PM By: [name] Entered By: [name] on 04/20/2019 08:43:26

## 2019-04-21 NOTE — Progress Notes (Signed)
[name] ([id number]) Visit Report for 04/20/2019 Abuse/Suicide Risk Screen Details Patient Name: [name], [name] Date of Service: 04/20/2019 8:15 AM Medical Record Number: [id number] Patient Account Number: [id number] Date of Birth/Sex: 03-05-1931 (85 y.o. F) Treating RN: Harold Barban Primary Care Lex Linhares: Emily Filbert Other Clinician: Referring [name] Treating [name] Abuse/Suicide Risk Screen Items Answer ABUSE RISK SCREEN: Has anyone close to you tried to hurt or harm you recentlyo No Do you feel uncomfortable with anyone in your familyo No Has anyone forced you do things that you didnot want to doo No Electronic Signature(s) Signed: 04/21/2019 4:28:48 PM By: Harold Barban Entered By: Harold Barban on 04/20/2019 09:01:23 [name] ([id number]) -------------------------------------------------------------------------------- Activities of Daily Living Details Patient Name: [name] Date of Service: 04/20/2019 8:15 AM Medical Record Number: [id number] Patient Account Number: [id number] Date of Birth/Sex: 10-08-1930 (85 y.o. F) Treating RN: Harold Barban Primary Care Tavious Griesinger: Emily Filbert Other Clinician: Referring [name] Treating [name] Activities of Daily Living Items Answer Activities of Daily Living (Please select one for each item) Drive Automobile Completely Able Take Medications Completely Able Use Telephone Completely Able Care for Appearance Completely Able Use Toilet Completely Able Bath / Shower Completely Able Dress Self Completely Able Feed Self Completely Able Walk Need Assistance Get In / Out Bed Completely Able Housework Need Assistance Prepare Meals Need Assistance Handle Money Completely Able Shop for Self Need Assistance Electronic Signature(s) Signed: 04/21/2019 4:28:48 PM By: Harold Barban Entered  By: Harold Barban on 04/20/2019 09:01:56 [name] ([id number]) -------------------------------------------------------------------------------- Education Screening Details Patient Name: [name] Date of Service: 04/20/2019 8:15 AM Medical Record Number: [id number] Patient Account Number: [id number] Date of Birth/Sex: Jan 16, 1931 (85 y.o. F) Treating RN: Harold Barban Primary Care Jodeci Rini: Emily Filbert Other Clinician: Referring [name] Treating [name] Primary Learner Assessed: Patient Learning Preferences/Education Level/Primary Language Learning Preference: Explanation Highest Education Level: College or Above Preferred Language: English Cognitive Barrier Language Barrier: No Translator Needed: No Memory Deficit: No Emotional Barrier: No Cultural/Religious Beliefs Affecting Medical Care: No Physical Barrier Impaired Vision: No Impaired Hearing: No Decreased Hand dexterity: No Knowledge/Comprehension Knowledge Level: High Comprehension Level: High Ability to understand written High instructions: Ability to understand verbal High instructions: Motivation Anxiety Level: Calm Cooperation: Cooperative Education Importance: Acknowledges Need Interest in Health Problems: Asks Questions Perception: Coherent Willingness to Engage in Self- High Management Activities: Readiness to Engage in Self- High Management Activities: Electronic Signature(s) Signed: 04/21/2019 4:28:48 PM By: Harold Barban Entered By: Harold Barban on 04/20/2019 09:02:34 [name] ([id number]) -------------------------------------------------------------------------------- Fall Risk Assessment Details Patient Name: [name] Date of Service: 04/20/2019 8:15 AM Medical Record Number: [id number] Patient Account Number: [id number] Date of Birth/Sex: [birth year] (85 y.o. F) Treating RN: Harold Barban Primary Care  [name] Other Clinician: Referring [name] Treating [name] Fall Risk Assessment Items Have you had 2 or more falls in the last 12 monthso 0 No Have you had any fall that resulted in injury in the last 12 monthso 0 No FALLS RISK SCREEN History of falling - immediate or within 3 months 0 No Secondary diagnosis (Do you have 2 or more medical diagnoseso) 0 No Ambulatory aid None/bed rest/wheelchair/nurse 0 No Crutches/cane/walker 15 Yes Furniture 0 No Intravenous therapy Access/Saline/Heparin Lock 0 No Gait/Transferring Normal/ bed rest/ wheelchair 0 No Weak (short steps with or without shuffle, stooped but  able to lift head while 0 No walking, may seek support from furniture) Impaired (short steps with shuffle, may have difficulty arising from chair, head 0 No down, impaired balance) Mental Status Oriented to own ability 0 Yes Electronic Signature(s) Signed: 04/21/2019 4:28:48 PM By: Harold Barban Entered By: Harold Barban on 04/20/2019 09:03:46 [name] ([id number]) -------------------------------------------------------------------------------- Foot Assessment Details Patient Name: [name] Date of Service: 04/20/2019 8:15 AM Medical Record Number: [id number] Patient Account Number: [id number] Date of Birth/Sex: 03/12/1931 (85 y.o. F) Treating RN: Harold Barban Primary Care Shalisa Mcquade: Emily Filbert Other Clinician: Referring Jennifr Gaeta: Emily Filbert Treating Aunika Kirsten/Extender: [name] Foot Assessment Items Site Locations + = Sensation present, - = Sensation absent, [name], [name], [name], [name], [name], [name], [name], arts) Signed: 04/21/2019 4:28:48 PM By: Harold Barban Entered By: Harold Barban on 04/20/2019 09:10:11 [name] ([id number]) -------------------------------------------------------------------------------- Nutrition Risk Screening Details Patient Name: [name] Date of Service: 04/20/2019 8:15 AM Medical Record Number: [id number] Patient Account Number: [id number] Date of Birth/Sex: Jun 07, 1930 (85 y.o. F) Treating RN: Harold Barban Primary Care Kirat Mezquita: Emily Filbert Other Clinician: Referring Ariba Lehnen: Emily Filbert Treating Mitsue Peery/Extender: [name] Height (in): 69 Weight (lbs): 195 Body Mass Index (BMI): 28.8 Nutrition Risk Screening Items Score Screening NUTRITION RISK SCREEN: I have an illness or condition that made me change the kind and/or amount of 0 No food I eat I eat fewer than two meals per day 0 No I eat few fruits and vegetables, or milk products 0 No I have three or more drinks of beer, liquor or wine almost every day 0 No I have tooth or mouth problems that make it hard for me to eat 0 No I don't always have enough money to buy the food I need 0 No I eat alone most of the time 0 No I take three or more different prescribed or over-the-counter drugs a day 1 Yes Without wanting to, I have lost or gained 10 pounds in the last six months 0 No I am not always physically able to shop, cook and/or feed myself 0 No Nutrition Protocols Good Risk Protocol Moderate Risk Protocol High Risk Proctocol Risk Level: Good Risk Score: 1 Electronic Signature(s) Signed: 04/21/2019 4:28:48 PM By: Harold Barban Entered By: Harold Barban on 04/20/2019 09:03:57

## 2019-04-21 NOTE — Progress Notes (Signed)
[place], [place] ([id number]) Visit Report for 04/20/2019 Chief Complaint Document Details Patient Name: [name], [name] [facility] Date of Service: 04/20/2019 8:15 AM Medical Record Number: [id number] Patient Account Number: [id number] Date of Birth/Sex: 11-01-1930 (85 y.o. F) Treating RN: Cornell Barman Primary Care Provider: Emily Filbert Other Clinician: Referring Provider: Emily Filbert Treating Provider/Extender: [name] Information Obtained from: Patient Chief Complaint 04/20/2019; patient is here for review of wounds on her bilateral dorsal feet Electronic Signature(s) Signed: 04/20/2019 6:09:55 PM By: Linton Ham MD Entered By: Linton Ham on 04/20/2019 10:06:59 [name], [name] ([id number]) -------------------------------------------------------------------------------- HPI Details Patient Name: [name] Date of Service: 04/20/2019 8:15 AM Medical Record Number: [id number] Patient Account Number: [id number] Date of Birth/Sex: [birth year] (85 y.o. F) Treating RN: Cornell Barman Primary Care Provider: Emily Filbert Other Clinician: Referring Provider: Emily Filbert Treating Provider/Extender: [name] History of Present Illness HPI Description: ADMISSION 04/20/2019 This is an 85 year old woman who lives in the independent part of [place]. She is a type II diabetic on oral agents and insulin with a recent hemoglobin A1c of 6.3 on 10/6. She was initially seen by primary care on 11/12 after dropping a heavy walker on her bilateral dorsal feet. She had blisters on the right and left lateral foot. She was given doxycycline. She was seen the next day in [place] clinic on 11/13 continued on the.C. Bilateral foot x-rays were negative. She was admitted to hospital from 11/17 through 11/27 with acute kidney injury I did not research this although the patient tells me that at discharge her kidney function had returned to normal. During the course of  this hospitalization it was noted she had wounds on her bilateral feet and her peripheral pulses were nonpalpable. She was evaluated by Dr. Delana Meyer. It was recommended that she consider angiography on the right leg and it was unlikely that she would heal the right leg as her ABI here was 0.48 with monophasic waveforms on the left her ABI was 1.19 with biphasic waveforms. One would wonder if the ABI was artificially elevated on the left. In any case Dr. Cydney Ok note accurately describes the patient's current state of mind. She does not want to rush into an intervention and she is here for our review of her wounds. Noteworthy that when she arrived I think her renal insufficiency was felt to be prerenal. Her GFR on arrival was 22 now 36. Past medical history includes type 2 diabetes with peripheral neuropathy, atrial fibrillation, renal insufficiency We did not calculate ABIs in our clinic this was 0.48 on the right and 1.19 on the left as noted above. Monophasic waveforms on the right biphasic on the left Electronic Signature(s) Signed: 04/20/2019 6:09:55 PM By: Linton Ham MD Entered By: Linton Ham on 04/20/2019 10:11:10 [name], [name] ([id number]) -------------------------------------------------------------------------------- Physical Exam Details Patient Name: [name] Date of Service: 04/20/2019 8:15 AM Medical Record Number: [id number] Patient Account Number: [id number] Date of Birth/Sex: 1930-12-13 (85 y.o. F) Treating RN: Cornell Barman Primary Care Provider: Emily Filbert Other Clinician: Referring Provider: Emily Filbert Treating Provider/Extender: [name] Constitutional Sitting or standing Blood Pressure is within target range for patient.. Pulse regular and within target range for patient.Marland Kitchen Respirations regular, non-labored and within target range.. Temperature is normal and within the target range for the patient.Marland Kitchen appears in no  distress. Eyes Conjunctivae clear. No discharge. Respiratory Respiratory effort is easy and symmetric bilaterally. Rate is normal at rest and on room air.. Bilateral  breath sounds are clear and equal in all lobes with no wheezes, rales or rhonchi.. Cardiovascular I had trouble feeling her popliteal or her femoral pulse on the right. Nonpalpable on the right faintly palpable on the left through edema. Edema is present on her bilateral lower legs. Lymphatic None palpable in the right popliteal or inguinal area. Integumentary (Hair, Skin) Primary cutaneous issues. Her feet are cool bilaterally. Psychiatric No evidence of depression, anxiety, or agitation. Calm, cooperative, and communicative. Appropriate interactions and affect.. Notes Wound exam; oOn the right there are 3 wounds to dorsally covered and adherent black eschar and 1 on the medial part of the first metatarsal head. No debridement was indicated given critical limb ischemia oOn the left healthy looking area that is a lot smaller looks as though it is progressing towards closure Electronic Signature(s) Signed: 04/20/2019 6:09:55 PM By: Linton Ham MD Entered By: Linton Ham on 04/20/2019 10:12:53 [name], [name] ([id number]) -------------------------------------------------------------------------------- Physician Orders Details Patient Name: [name] Date of Service: 04/20/2019 8:15 AM Medical Record Number: [id number] Patient Account Number: [id number] Date of Birth/Sex: [birth year] (85 y.o. F) Treating RN: Cornell Barman Primary Care Provider: Emily Filbert Other Clinician: Referring Provider: Emily Filbert Treating Provider/Extender: [name] Verbal / Phone Orders: No Diagnosis Coding Wound Cleansing o Clean wound with Normal Saline. Anesthetic (add to Medication List) Wound #1 Left,Dorsal Foot o Topical Lidocaine 4% cream applied to wound bed prior to debridement (In Clinic  Only). Wound #2 Right,Dorsal Foot o Topical Lidocaine 4% cream applied to wound bed prior to debridement (In Clinic Only). Primary Wound Dressing Wound #1 Left,Dorsal Foot o Silver Collagen Wound #2 Right,Dorsal Foot o Santyl Ointment Secondary Dressing Wound #1 Left,Dorsal Foot o ABD and Kerlix/Conform - wrapped lightly Wound #2 Right,Dorsal Foot o ABD and Kerlix/Conform - wrapped lightly Dressing Change Frequency Wound #1 Left,Dorsal Foot o Change Dressing Monday, Wednesday, Friday Wound #2 Right,Dorsal Foot o Change dressing every day. Follow-up Appointments Wound #1 Left,Dorsal Foot o Return Appointment in 1 week. Wound #2 Right,Dorsal Foot o Return Appointment in 1 week. Edema Control Wound #1 Left,Dorsal Foot o Elevate legs to the level of the heart and pump ankles as often as possible Wound #2 Right,Dorsal Foot [place], Derya ([id number]) o Elevate legs to the level of the heart and pump ankles as often as possible Notes Rehab at [facility] [place], Patient to decide on vascular procedure. Electronic Signature(s) Signed: 04/20/2019 5:38:26 PM By: Gretta Cool, BSN, RN, CWS, Kim RN, BSN Signed: 04/20/2019 6:09:55 PM By: Linton Ham MD Entered By: Gretta Cool, BSN, RN, CWS, Kim on 04/20/2019 09:56:56 Vernon, Trainer ([id number]) -------------------------------------------------------------------------------- Problem List Details Patient Name: [name] Date of Service: 04/20/2019 8:15 AM Medical Record Number: [id number] Patient Account Number: [id number] Date of Birth/Sex: 1930-09-23 (85 y.o. F) Treating RN: Cornell Barman Primary Care Provider: Emily Filbert Other Clinician: Referring Provider: Emily Filbert Treating Provider/Extender: [name] Active Problems ICD-10 Evaluated Encounter Code Description Active Date Today Diagnosis E11.52 Type 2 diabetes mellitus with diabetic peripheral angiopathy 04/20/2019 No Yes with gangrene E11.42  Type 2 diabetes mellitus with diabetic polyneuropathy 04/20/2019 No Yes L97.528 Non-pressure chronic ulcer of other part of left foot with other 04/20/2019 No Yes specified severity E11.621 Type 2 diabetes mellitus with foot ulcer 04/20/2019 No Yes L97.518 Non-pressure chronic ulcer of other part of right foot with 04/20/2019 No Yes other specified severity Inactive Problems Resolved Problems Electronic Signature(s) Signed: 04/20/2019 6:09:55 PM By: Linton Ham MD Entered By: Dellia Nims,  Janayia Burggraf on 04/20/2019 10:24:03 [name], [name] ([id number]) -------------------------------------------------------------------------------- Progress Note Details Patient Name: Rel[facility] Date of Service: 04/20/2019 8:15 AM Medical Record Number: [id number] Patient Account Number: [id number] Date of Birth/Sex: February 02, 1931 (85 y.o. F) Treating RN: Cornell Barman Primary Care Provider: Emily Filbert Other Clinician: Referring Provider: Emily Filbert Treating Provider/Extender: [name] Subjective Chief Complaint Information obtained from Patient 04/20/2019; patient is here for review of wounds on her bilateral dorsal feet History of Present Illness (HPI) ADMISSION 04/20/2019 This is an 85 year old woman who lives in the independent part of [place]. She is a type II diabetic on oral agents and insulin with a recent hemoglobin A1c of 6.3 on 10/6. She was initially seen by primary care on 11/12 after dropping a heavy walker on her bilateral dorsal feet. She had blisters on the right and left lateral foot. She was given doxycycline. She was seen the next day in [place] clinic on 11/13 continued on the.C. Bilateral foot x-rays were negative. She was admitted to hospital from 11/17 through 11/27 with acute kidney injury I did not research this although the patient tells me that at discharge her kidney function had returned to normal. During the course of this hospitalization it was  noted she had wounds on her bilateral feet and her peripheral pulses were nonpalpable. She was evaluated by Dr. Delana Meyer. It was recommended that she consider angiography on the right leg and it was unlikely that she would heal the right leg as her ABI here was 0.48 with monophasic waveforms on the left her ABI was 1.19 with biphasic waveforms. One would wonder if the ABI was artificially elevated on the left. In any case Dr. Cydney Ok note accurately describes the patient's current state of mind. She does not want to rush into an intervention and she is here for our review of her wounds. Noteworthy that when she arrived I think her renal insufficiency was felt to be prerenal. Her GFR on arrival was 22 now 36. Past medical history includes type 2 diabetes with peripheral neuropathy, atrial fibrillation, renal insufficiency We did not calculate ABIs in our clinic this was 0.48 on the right and 1.19 on the left as noted above. Monophasic waveforms on the right biphasic on the left Patient History Information obtained from Patient. Allergies No Known Drug Allergies Family History Cancer - Siblings, Diabetes - Mother,Siblings,Father, Heart Disease - Siblings, Stroke - Siblings, Thyroid Problems - Siblings, No family history of Hereditary Spherocytosis, Hypertension, Kidney Disease, Lung Disease, Seizures, Tuberculosis. Social History Never smoker, Marital Status - Divorced, Alcohol Use - Daily - wine, Drug Use - No History, Caffeine Use - Daily - coffee. Medical History Eyes Patient has history of Cataracts Hematologic/Lymphatic Sokolowski, Anntionette ([id number]) Denies history of Anemia, Hemophilia, Human Immunodeficiency Virus, Lymphedema, Sickle Cell Disease Respiratory Denies history of Aspiration, Asthma, Chronic Obstructive Pulmonary Disease (COPD), Pneumothorax, Sleep Apnea, Tuberculosis Cardiovascular Patient has history of Arrhythmia - A Fib, Congestive Heart Failure,  Hypertension Gastrointestinal Denies history of Cirrhosis , Colitis, Crohn s, Hepatitis A, Hepatitis B, Hepatitis C Endocrine Patient has history of Type II Diabetes Immunological Denies history of Lupus Erythematosus, Raynaud s, Scleroderma Integumentary (Skin) Denies history of History of Burn, History of pressure wounds Musculoskeletal Patient has history of Rheumatoid Arthritis - back Denies history of Gout, Osteoarthritis, Osteomyelitis Neurologic Denies history of Dementia, Neuropathy, Quadriplegia, Paraplegia, Seizure Disorder Patient is treated with Insulin, Oral Agents. Blood sugar is tested. Blood sugar results noted at the following times: Breakfast -  80. Medical And Surgical History Notes Oncologic Bladder cancer 2010 Review of Systems (ROS) Constitutional Symptoms (General Health) Denies complaints or symptoms of Fatigue, Fever, Chills, Marked Weight Change. Eyes Complains or has symptoms of Glasses / Contacts. Ear/Nose/Mouth/Throat Hearing diminished Hematologic/Lymphatic Denies complaints or symptoms of Bleeding / Clotting Disorders, Human Immunodeficiency Virus. Respiratory Denies complaints or symptoms of Chronic or frequent coughs, Shortness of Breath. Cardiovascular Complains or has symptoms of LE edema - Bilateral legs/feet. Denies complaints or symptoms of Chest pain. Gastrointestinal Denies complaints or symptoms of Frequent diarrhea, Nausea, Vomiting. Endocrine Denies complaints or symptoms of Hepatitis, Thyroid disease, Polydypsia (Excessive Thirst). Genitourinary Acute kidney injury Immunological Denies complaints or symptoms of Hives, Itching. Integumentary (Skin) Complains or has symptoms of Wounds, Swelling. Denies complaints or symptoms of Bleeding or bruising tendency, Breakdown. Musculoskeletal Denies complaints or symptoms of Muscle Pain, Muscle Weakness. Neurologic Denies complaints or symptoms of Numbness/parasthesias,  Focal/Weakness. Psychiatric Denies complaints or symptoms of Anxiety, Claustrophobia. [name], [name] ([id number]) Objective Constitutional Sitting or standing Blood Pressure is within target range for patient.. Pulse regular and within target range for patient.Marland Kitchen Respirations regular, non-labored and within target range.. Temperature is normal and within the target range for the patient.Marland Kitchen appears in no distress. Vitals Time Taken: 8:40 AM, Height: 69 in, Source: Stated, Weight: 195 lbs, Source: Stated, BMI: 28.8, Temperature: 98.3 F, Pulse: 65 bpm, Respiratory Rate: 16 breaths/min, Blood Pressure: 135/87 mmHg. Eyes Conjunctivae clear. No discharge. Respiratory Respiratory effort is easy and symmetric bilaterally. Rate is normal at rest and on room air.. Bilateral breath sounds are clear and equal in all lobes with no wheezes, rales or rhonchi.. Cardiovascular I had trouble feeling her popliteal or her femoral pulse on the right. Nonpalpable on the right faintly palpable on the left through edema. Edema is present on her bilateral lower legs. Lymphatic None palpable in the right popliteal or inguinal area. Psychiatric No evidence of depression, anxiety, or agitation. Calm, cooperative, and communicative. Appropriate interactions and affect.. General Notes: Wound exam; On the right there are 3 wounds to dorsally covered and adherent black eschar and 1 on the medial part of the first metatarsal head. No debridement was indicated given critical limb ischemia On the left healthy looking area that is a lot smaller looks as though it is progressing towards closure Integumentary (Hair, Skin) Primary cutaneous issues. Her feet are cool bilaterally. Wound #1 status is Open. Original cause of wound was Trauma. The wound is located on the Left,Dorsal Foot. The wound measures 0.5cm length x 0.7cm width x 0.1cm depth; 0.275cm^2 area and 0.027cm^3 volume. There is Fat Layer (Subcutaneous Tissue) Exposed  exposed. There is no tunneling or undermining noted. There is a medium amount of serosanguineous drainage noted. The wound margin is flat and intact. There is small (1-33%) pink granulation within the wound bed. There is a large (67-100%) amount of necrotic tissue within the wound bed including Adherent Slough. Wound #2 status is Open. Original cause of wound was Trauma. The wound is located on the Right,Dorsal Foot. The wound measures 3cm length x 2.8cm width x 0.1cm depth; 6.597cm^2 area and 0.66cm^3 volume. There is no tunneling or undermining noted. There is a small amount of serosanguineous drainage noted. The wound margin is thickened. There is no granulation within the wound bed. There is a large (67-100%) amount of necrotic tissue within the wound bed including Eschar. [name], [name] ([id number]) Assessment Active Problems ICD-10 Type 2 diabetes mellitus with diabetic peripheral angiopathy with gangrene Type 2 diabetes mellitus with  diabetic polyneuropathy Type 2 diabetes mellitus with foot ulcer Non-pressure chronic ulcer of other part of left foot with other specified severity Plan Wound Cleansing: Clean wound with Normal Saline. Anesthetic (add to Medication List): Wound #1 Left,Dorsal Foot: Topical Lidocaine 4% cream applied to wound bed prior to debridement (In Clinic Only). Wound #2 Right,Dorsal Foot: Topical Lidocaine 4% cream applied to wound bed prior to debridement (In Clinic Only). Primary Wound Dressing: Wound #1 Left,Dorsal Foot: Silver Collagen Wound #2 Right,Dorsal Foot: Santyl Ointment Secondary Dressing: Wound #1 Left,Dorsal Foot: ABD and Kerlix/Conform - wrapped lightly Wound #2 Right,Dorsal Foot: ABD and Kerlix/Conform - wrapped lightly Dressing Change Frequency: Wound #1 Left,Dorsal Foot: Change Dressing Monday, Wednesday, Friday Wound #2 Right,Dorsal Foot: Change dressing every day. Follow-up Appointments: Wound #1 Left,Dorsal Foot: Return  Appointment in 1 week. Wound #2 Right,Dorsal Foot: Return Appointment in 1 week. Edema Control: Wound #1 Left,Dorsal Foot: Elevate legs to the level of the heart and pump ankles as often as possible Wound #2 Right,Dorsal Foot: Elevate legs to the level of the heart and pump ankles as often as possible General Notes: Rehab at [facility], Patient to decide on vascular procedure. 1. We use Santyl to the wounds on the right silver collagen to the left ABDs and Curlex change daily 2. I spent some time talking to the patient about angiogram especially on the right leg. In my view I think the risks are worth taking. She has critical limb ischemia and could result in nonhealing of wounds further wounds and even amputation. She is [name], [name] ([id number]) concerned about her kidney function and rightly so however with reduced dye load hydration I think we could likely get her through this 3. I agree that the likelihood of healing the wounds on the right is not high without revascularization. The areas on the left appear to be making good progress Electronic Signature(s) Signed: 04/20/2019 6:09:55 PM By: Linton Ham MD Entered By: Linton Ham on 04/20/2019 10:14:45 [name], [name] ([id number]) -------------------------------------------------------------------------------- ROS/PFSH Details Patient Name: [name] Date of Service: 04/20/2019 8:15 AM Medical Record Number: [id number] Patient Account Number: [id number] Date of Birth/Sex: Jun 23, 1930 (85 y.o. F) Treating RN: Harold Barban Primary Care Provider: Emily Filbert Other Clinician: Referring Provider: Emily Filbert Treating Provider/Extender: [name] Information Obtained From Patient Constitutional Symptoms (General Health) Complaints and Symptoms: Negative for: Fatigue; Fever; Chills; Marked Weight Change Eyes Complaints and Symptoms: Positive for: Glasses / Contacts Medical History: Positive  for: Cataracts Hematologic/Lymphatic Complaints and Symptoms: Negative for: Bleeding / Clotting Disorders; Human Immunodeficiency Virus Medical History: Negative for: Anemia; Hemophilia; Human Immunodeficiency Virus; Lymphedema; Sickle Cell Disease Respiratory Complaints and Symptoms: Negative for: Chronic or frequent coughs; Shortness of Breath Medical History: Negative for: Aspiration; Asthma; Chronic Obstructive Pulmonary Disease (COPD); Pneumothorax; Sleep Apnea; Tuberculosis Cardiovascular Complaints and Symptoms: Positive for: LE edema - Bilateral legs/feet Negative for: Chest pain Medical History: Positive for: Arrhythmia - A Fib; Congestive Heart Failure; Hypertension Gastrointestinal Complaints and Symptoms: Negative for: Frequent diarrhea; Nausea; Vomiting Medical History: Negative for: Cirrhosis ; Colitis; Crohnos; Hepatitis A; Hepatitis B; Hepatitis C [name], [name] ([id number]) Endocrine Complaints and Symptoms: Negative for: Hepatitis; Thyroid disease; Polydypsia (Excessive Thirst) Medical History: Positive for: Type II Diabetes Time with diabetes: 30 years Treated with: Insulin, Oral agents Blood sugar tested every day: Yes Tested : 3 x's day Blood sugar testing results: Breakfast: 80 Immunological Complaints and Symptoms: Negative for: Hives; Itching Medical History: Negative for: Lupus Erythematosus; Raynaudos; Scleroderma Integumentary (Skin) Complaints  and Symptoms: Positive for: Wounds; Swelling Negative for: Bleeding or bruising tendency; Breakdown Medical History: Negative for: History of Burn; History of pressure wounds Musculoskeletal Complaints and Symptoms: Negative for: Muscle Pain; Muscle Weakness Medical History: Positive for: Rheumatoid Arthritis - back Negative for: Gout; Osteoarthritis; Osteomyelitis Neurologic Complaints and Symptoms: Negative for: Numbness/parasthesias; Focal/Weakness Medical History: Negative for: Dementia;  Neuropathy; Quadriplegia; Paraplegia; Seizure Disorder Psychiatric Complaints and Symptoms: Negative for: Anxiety; Claustrophobia Ear/Nose/Mouth/Throat Complaints and Symptoms: Review of System Notes: Hearing diminished Genitourinary Modi, [name] ([id number]) Complaints and Symptoms: Review of System Notes: Acute kidney injury Oncologic Medical History: Past Medical History Notes: Bladder cancer 2010 HBO Extended History Items Eyes: Cataracts Immunizations Pneumococcal Vaccine: Received Pneumococcal Vaccination: Yes Implantable Devices No devices added Family and Social History Cancer: Yes - Siblings; Diabetes: Yes - Mother,Siblings,Father; Heart Disease: Yes - Siblings; Hereditary Spherocytosis: No; Hypertension: No; Kidney Disease: No; Lung Disease: No; Seizures: No; Stroke: Yes - Siblings; Thyroid Problems: Yes - Siblings; Tuberculosis: No; Never smoker; Marital Status - Divorced; Alcohol Use: Daily - wine; Drug Use: No History; Caffeine Use: Daily - coffee; Financial Concerns: No; Food, Clothing or Shelter Needs: No; Support System Lacking: No; Transportation Concerns: No Electronic Signature(s) Signed: 04/20/2019 6:09:55 PM By: Linton Ham MD Signed: 04/21/2019 4:28:48 PM By: Harold Barban Entered By: Harold Barban on 04/20/2019 09:01:16 [name], [name] ([id number]) -------------------------------------------------------------------------------- SuperBill Details Patient Name: [name] Date of Service: 04/20/2019 Medical Record Number: [id number] Patient Account Number: [id number] Date of Birth/Sex: 12-05-1930 (85 y.o. F) Treating RN: Cornell Barman Primary Care Provider: Emily Filbert Other Clinician: Referring Provider: Emily Filbert Treating Provider/Extender: [name] Diagnosis Coding ICD-10 Codes Code Description E11.52 Type 2 diabetes mellitus with diabetic peripheral angiopathy with gangrene E11.42 Type 2 diabetes mellitus  with diabetic polyneuropathy L97.528 Non-pressure chronic ulcer of other part of left foot with other specified severity E11.621 Type 2 diabetes mellitus with foot ulcer L97.518 Non-pressure chronic ulcer of other part of right foot with other specified severity Facility Procedures CPT4 Code: [id number] Description: 99214 - WOUND CARE VISIT-LEV 4 EST PT Modifier: Quantity: 1 Physician Procedures CPT4 Code Description: [id number] Quentin PHYS LEVEL 3 o NEW PT ICD-10 Diagnosis Description E11.52 Type 2 diabetes mellitus with diabetic peripheral angiopathy w L97.518 Non-pressure chronic ulcer of other part of right foot with ot L97.528 Non-pressure chronic  ulcer of other part of left foot with oth E11.621 Type 2 diabetes mellitus with foot ulcer Modifier: ith gangrene her specified seve er specified sever Quantity: 1 rity Passenger transport manager) Signed: 04/20/2019 6:09:55 PM By: Linton Ham MD Entered By: Linton Ham on 04/20/2019 10:25:01

## 2019-04-25 DIAGNOSIS — I739 Peripheral vascular disease, unspecified: Secondary | ICD-10-CM | POA: Diagnosis not present

## 2019-04-25 DIAGNOSIS — E1121 Type 2 diabetes mellitus with diabetic nephropathy: Secondary | ICD-10-CM

## 2019-04-25 DIAGNOSIS — E11621 Type 2 diabetes mellitus with foot ulcer: Secondary | ICD-10-CM | POA: Diagnosis not present

## 2019-04-25 DIAGNOSIS — L03119 Cellulitis of unspecified part of limb: Secondary | ICD-10-CM | POA: Diagnosis not present

## 2019-04-25 DIAGNOSIS — I5032 Chronic diastolic (congestive) heart failure: Secondary | ICD-10-CM | POA: Diagnosis not present

## 2019-04-27 ENCOUNTER — Other Ambulatory Visit: Payer: Self-pay

## 2019-04-27 ENCOUNTER — Encounter: Payer: No Typology Code available for payment source | Admitting: Internal Medicine

## 2019-04-27 DIAGNOSIS — E1152 Type 2 diabetes mellitus with diabetic peripheral angiopathy with gangrene: Secondary | ICD-10-CM | POA: Diagnosis not present

## 2019-04-28 NOTE — Progress Notes (Signed)
[place], Cinderella ([id number]) Visit Report for 04/27/2019 HPI Details Patient Name: [name], [name] [facility] Date of Service: 04/27/2019 8:45 AM Medical Record Number: [id number] Patient Account Number: [id number] Date of Birth/Sex: 19-Mar-1931 (85 y.o. F) Treating RN: Cornell Barman Primary Care Provider: Emily Filbert Other Clinician: Referring Provider: Emily Filbert Treating Provider/Extender: Tito Dine in Treatment: 1 History of Present Illness HPI Description: ADMISSION 04/20/2019 This is an 85 year old woman who lives in the independent part of [place]. She is a type II diabetic on oral agents and insulin with a recent hemoglobin A1c of 6.3 on 10/6. She was initially seen by primary care on 11/12 after dropping a heavy walker on her bilateral dorsal feet. She had blisters on the right and left lateral foot. She was given doxycycline. She was seen the next day in [place] clinic on 11/13 continued on the.C. Bilateral foot x-rays were negative. She was admitted to hospital from 11/17 through 11/27 with acute kidney injury I did not research this although the patient tells me that at discharge her kidney function had returned to normal. During the course of this hospitalization it was noted she had wounds on her bilateral feet and her peripheral pulses were nonpalpable. She was evaluated by Dr. Delana Meyer. It was recommended that she consider angiography on the right leg and it was unlikely that she would heal the right leg as her ABI here was 0.48 with monophasic waveforms on the left her ABI was 1.19 with biphasic waveforms. One would wonder if the ABI was artificially elevated on the left. In any case Dr. Cydney Ok note accurately describes the patient's current state of mind. She does not want to rush into an intervention and she is here for our review of her wounds. Noteworthy that when she arrived I think her renal insufficiency was felt to be prerenal. Her GFR on arrival was 22 now 36. Past  medical history includes type 2 diabetes with peripheral neuropathy, atrial fibrillation, renal insufficiency We did not calculate ABIs in our clinic this was 0.48 on the right and 1.19 on the left as noted above. Monophasic waveforms on the right biphasic on the left 12/16; patient admitted to the clinic last week with trauma critical limb ischemia on the right. Today her wound on the left dorsal foot is healed. She still has the black eschar on the dorsal right foot that we have been using Santyl on. There is apparently some drainage. She arrives in clinic today with more swelling on the right great and the left dorsal surrounding her foot wounds. Some erythema especially on the right. There is some tenderness on the right but not as much as I might expect if this was all infection nevertheless he does have some neuropathy which might be blunting discomfort. She reports no additional trauma. She is at the rehab section of [facility], she lives in the independent part of [place]. She tells me she has some pain in the right leg at night which wakes her up from sleeping although she manages to get back to sleep. She has about 150 foot exercise tolerance and then gets fatigue in her legs. Again I discussed the issue of angiogram with the patient especially on the right. She just will not agree to this. She states she wants the rehab and then go to [facility] where a list of her specialists. She was on Levaquin last week we did not prescribe this but she is finished. She describes some nausea and loose stools but does  not sound dramatically unwell with this Electronic Signature(s) Signed: 04/27/2019 4:44:44 PM By: Linton Ham MD Entered By: Linton Ham on 04/27/2019 09:41:50 Obey, Murline ([id number]) -------------------------------------------------------------------------------- Callus Pairing Details Patient Name: [name] Date of Service: 04/27/2019 8:45 AM Medical Record Number:  [id number] Patient Account Number: [id number] Date of Birth/Sex: [birth year] (85 y.o. F) Treating RN: Cornell Barman Primary Care Provider: Emily Filbert Other Clinician: Referring Provider: Emily Filbert Treating Provider/Extender: Tito Dine in Treatment: 1 Procedure Performed for: NonWound Condition Other Dermatologic Condition - Right Foot Performed By: Physician Ricard Dillon, MD Post Procedure Diagnosis Same as Pre-procedure Notes Callus pairing on right 5th met head. Electronic Signature(s) Signed: 04/27/2019 4:44:44 PM By: Linton Ham MD Entered By: Linton Ham on 04/27/2019 09:37:30 Glanzer, Florita ([id number]) -------------------------------------------------------------------------------- Physical Exam Details Patient Name: [name] Date of Service: 04/27/2019 8:45 AM Medical Record Number: [id number] Patient Account Number: [id number] Date of Birth/Sex: 02/25/1931 (85 y.o. F) Treating RN: Cornell Barman Primary Care Provider: Emily Filbert Other Clinician: Referring Provider: Emily Filbert Treating Provider/Extender: Tito Dine in Treatment: 1 Constitutional Patient is hypertensive.. Pulse regular and within target range for patient.Marland Kitchen Respirations regular, non-labored and within target range.. Temperature is normal and within the target range for the patient.Marland Kitchen appears in no distress. Eyes Conjunctivae clear. No discharge. Respiratory Respiratory effort is easy and symmetric bilaterally. Rate is normal at rest and on room air.. Cardiovascular Popliteal pulses are nonpalpable. I cannot feel a femoral pulse on the right. Pedal pulses absent bilaterally.. Lymphatic None palpable in the popliteal or inguinal. Neurological She has some reduction in light touch/microfilament on her bilateral plantar but not dramatic. Psychiatric No evidence of depression, anxiety, or agitation. Calm, cooperative, and communicative. Appropriate interactions and  affect.. Notes Wound exam oOn the right she still has adherent black ischemic-looking eschar. This is not started to separate. I removed some flaking callus from the medial part of her first met head. Different this week is there is more swelling around the wound. This almost looks like bruising has some fluctuance to the area to the palpation. But not a lot of pain. oOn the left side the wound is largely closed over there is still some mild amount of eschar and a few spots. There is again fluctuance in this area as well. Electronic Signature(s) Signed: 04/27/2019 4:44:44 PM By: Linton Ham MD Entered By: Linton Ham on 04/27/2019 09:44:42 Gautreau, Noura ([id number]) -------------------------------------------------------------------------------- Physician Orders Details Patient Name: [name] Date of Service: 04/27/2019 8:45 AM Medical Record Number: [id number] Patient Account Number: [id number] Date of Birth/Sex: [birth year] (85 y.o. F) Treating RN: Cornell Barman Primary Care Provider: Emily Filbert Other Clinician: Referring Provider: Emily Filbert Treating Provider/Extender: Tito Dine in Treatment: 1 Verbal / Phone Orders: No Diagnosis Coding Wound Cleansing Wound #1 Left,Dorsal Foot o Clean wound with Normal Saline. Wound #2 Right,Dorsal Foot o Clean wound with Normal Saline. Anesthetic (add to Medication List) Wound #1 Left,Dorsal Foot o Topical Lidocaine 4% cream applied to wound bed prior to debridement (In Clinic Only). Wound #2 Right,Dorsal Foot o Topical Lidocaine 4% cream applied to wound bed prior to debridement (In Clinic Only). o Topical Lidocaine 4% cream applied to wound bed prior to debridement (In Clinic Only). Primary Wound Dressing Wound #1 Left,Dorsal Foot o Other: - Bordered Foam Wound #2 Right,Dorsal Foot o Santyl Ointment Secondary Dressing Wound #2 Right,Dorsal Foot o ABD and Kerlix/Conform - wrapped lightly Dressing  Change Frequency Wound #1 Left,Dorsal Foot o Change Dressing Tuesday and Thursday.  Wound #2 Right,Dorsal Foot o Change dressing every day. Follow-up Appointments Wound #1 Left,Dorsal Foot o Return Appointment in 1 week. Wound #2 Right,Dorsal Foot o Return Appointment in 1 week. Edema Control Wound #1 Left,Dorsal Foot Bradburn, Keirstan ([id number]) o Elevate legs to the level of the heart and pump ankles as often as possible Wound #2 Right,Dorsal Foot o Elevate legs to the level of the heart and pump ankles as often as possible Patient Medications Allergies: No Known Drug Allergies Notifications Medication Indication Start End doxycycline hyclate wound infectiono 04/27/2019 DOSE 1 - oral 100 mg capsule - 1 capsule oral BID for 7 days Electronic Signature(s) Signed: 04/27/2019 4:44:44 PM By: Linton Ham MD Signed: 04/28/2019 11:42:17 AM By: Gretta Cool, BSN, RN, CWS, Kim RN, BSN Entered By: Gretta Cool, BSN, RN, CWS, Kim on 04/27/2019 09:29:41 Persley, Sheritta ([id number]) -------------------------------------------------------------------------------- Prescription 04/27/2019 Patient Name: [name] [facility] Provider: Ricard Dillon MD Date of Birth: Feb 27, 1931 NPI#: [id number] Sex: F DEA#: [id number] Phone #: [phone] License #: [id number] Patient Address: [place] [address], [place] [address] [address] [place],  [address] [phone] Allergies No Known Drug Allergies Medication Medication: Route: Strength: Form: doxycycline hyclate oral 100 mg capsule Class: PERIODONTAL COLLAGENASE INHIBITORS Dose: Frequency / Time: Indication: 1 1 capsule oral BID for 7 days wound infectiono Number of Refills: Number of Units: 0 Fourteen (14) Capsule(s) Generic Substitution: Start Date: End Date: Administered at Substitution Permitted 71/24/5809 Facility: No Note to  Pharmacy: Signature(s): Date(s): Engineer, maintenance) Signed: 04/27/2019 4:44:44 PM By: Linton Ham MD Signed: 04/28/2019 11:42:17 AM By: Gretta Cool, BSN, RN, CWS, Kim RN, BSN Entered By: Gretta Cool, BSN, RN, CWS, Kim on 04/27/2019 09:29:42 [name], Bryley ([id number]) --------------------------------------------------------------------------------  Problem List Details Patient Name: [name] Date of Service: 04/27/2019 8:45 AM Medical Record Number: [id number] Patient Account Number: [id number] Date of Birth/Sex: 1930-12-24 (85 y.o. F) Treating RN: Cornell Barman Primary Care Provider: Emily Filbert Other Clinician: Referring Provider: Emily Filbert Treating Provider/Extender: Tito Dine in Treatment: 1 Active Problems ICD-10 Evaluated Encounter Code Description Active Date Today Diagnosis E11.52 Type 2 diabetes mellitus with diabetic peripheral angiopathy 04/20/2019 No Yes with gangrene E11.42 Type 2 diabetes mellitus with diabetic polyneuropathy 04/20/2019 No Yes L97.528 Non-pressure chronic ulcer of other part of left foot with other 04/20/2019 No Yes specified severity E11.621 Type 2 diabetes mellitus with foot ulcer 04/20/2019 No Yes L97.518 Non-pressure chronic ulcer of other part of right foot with 04/20/2019 No Yes other specified severity Inactive Problems Resolved Problems Electronic Signature(s) Signed: 04/27/2019 4:44:44 PM By: Linton Ham MD Entered By: Linton Ham on 04/27/2019 09:35:35 Hellstrom, Lexani ([id number]) -------------------------------------------------------------------------------- Progress Note Details Patient Name: [name] Date of Service: 04/27/2019 8:45 AM Medical Record Number: [id number] Patient Account Number: [id number] Date of Birth/Sex: Oct 21, 1930 (85 y.o. F) Treating RN: Cornell Barman Primary Care Provider: Emily Filbert Other Clinician: Referring Provider: Emily Filbert Treating Provider/Extender: Tito Dine in  Treatment: 1 Subjective History of Present Illness (HPI) ADMISSION 04/20/2019 This is an 85 year old woman who lives in the independent part of [place]. She is a type II diabetic on oral agents and insulin with a recent hemoglobin A1c of 6.3 on 10/6. She was initially seen by primary care on 11/12 after dropping a heavy walker on her bilateral dorsal feet. She had blisters on the right and left lateral foot. She was given doxycycline. She was seen the next day in [place] clinic on 11/13 continued on the.C.  Bilateral foot x-rays were negative. She was admitted to hospital from 11/17 through 11/27 with acute kidney injury I did not research this although the patient tells me that at discharge her kidney function had returned to normal. During the course of this hospitalization it was noted she had wounds on her bilateral feet and her peripheral pulses were nonpalpable. She was evaluated by Dr. Delana Meyer. It was recommended that she consider angiography on the right leg and it was unlikely that she would heal the right leg as her ABI here was 0.48 with monophasic waveforms on the left her ABI was 1.19 with biphasic waveforms. One would wonder if the ABI was artificially elevated on the left. In any case Dr. Cydney Ok note accurately describes the patient's current state of mind. She does not want to rush into an intervention and she is here for our review of her wounds. Noteworthy that when she arrived I think her renal insufficiency was felt to be prerenal. Her GFR on arrival was 22 now 36. Past medical history includes type 2 diabetes with peripheral neuropathy, atrial fibrillation, renal insufficiency We did not calculate ABIs in our clinic this was 0.48 on the right and 1.19 on the left as noted above. Monophasic waveforms on the right biphasic on the left 12/16; patient admitted to the clinic last week with trauma critical limb ischemia on the right. Today her wound on the left dorsal foot is  healed. She still has the black eschar on the dorsal right foot that we have been using Santyl on. There is apparently some drainage. She arrives in clinic today with more swelling on the right great and the left dorsal surrounding her foot wounds. Some erythema especially on the right. There is some tenderness on the right but not as much as I might expect if this was all infection nevertheless he does have some neuropathy which might be blunting discomfort. She reports no additional trauma. She is at the rehab section of [facility], she lives in the independent part of [place]. She tells me she has some pain in the right leg at night which wakes her up from sleeping although she manages to get back to sleep. She has about 150 foot exercise tolerance and then gets fatigue in her legs. Again I discussed the issue of angiogram with the patient especially on the right. She just will not agree to this. She states she wants the rehab and then go to [facility] where a list of her specialists. She was on Levaquin last week we did not prescribe this but she is finished. She describes some nausea and loose stools but does not sound dramatically unwell with this Objective Constitutional Patient is hypertensive.. Pulse regular and within target range for patient.Marland Kitchen Respirations regular, non-labored and within target Ricci, Brenleigh ([id number]) range.. Temperature is normal and within the target range for the patient.Marland Kitchen appears in no distress. Vitals Time Taken: 8:57 AM, Height: 69 in, Weight: 195 lbs, BMI: 28.8, Temperature: 98.7 F, Pulse: 55 bpm, Respiratory Rate: 16 breaths/min, Blood Pressure: 175/89 mmHg. Eyes Conjunctivae clear. No discharge. Respiratory Respiratory effort is easy and symmetric bilaterally. Rate is normal at rest and on room air.. Cardiovascular Popliteal pulses are nonpalpable. I cannot feel a femoral pulse on the right. Pedal pulses absent bilaterally.. Lymphatic None palpable in the  popliteal or inguinal. Neurological She has some reduction in light touch/microfilament on her bilateral plantar but not dramatic. Psychiatric No evidence of depression, anxiety, or agitation. Calm, cooperative, and communicative.  Appropriate interactions and affect.. General Notes: Wound exam On the right she still has adherent black ischemic-looking eschar. This is not started to separate. I removed some flaking callus from the medial part of her first met head. Different this week is there is more swelling around the wound. This almost looks like bruising has some fluctuance to the area to the palpation. But not a lot of pain. On the left side the wound is largely closed over there is still some mild amount of eschar and a few spots. There is again fluctuance in this area as well. Integumentary (Hair, Skin) Wound #1 status is Open. Original cause of wound was Trauma. The wound is located on the Left,Dorsal Foot. The wound measures 0.3cm length x 0.6cm width x 0.1cm depth; 0.141cm^2 area and 0.014cm^3 volume. There is no tunneling or undermining noted. There is a medium amount of serosanguineous drainage noted. The wound margin is flat and intact. There is no granulation within the wound bed. There is a large (67-100%) amount of necrotic tissue within the wound bed including Eschar. Wound #2 status is Open. Original cause of wound was Trauma. The wound is located on the Right,Dorsal Foot. The wound measures 2.4cm length x 2.8cm width x 0.1cm depth; 5.278cm^2 area and 0.528cm^3 volume. There is no tunneling or undermining noted. There is a small amount of serosanguineous drainage noted. The wound margin is thickened. There is no granulation within the wound bed. There is a large (67-100%) amount of necrotic tissue within the wound bed including Eschar. Assessment Active Problems ICD-10 Type 2 diabetes mellitus with diabetic peripheral angiopathy with gangrene Type 2 diabetes mellitus with  diabetic polyneuropathy Non-pressure chronic ulcer of other part of left foot with other specified severity Type 2 diabetes mellitus with foot ulcer Non-pressure chronic ulcer of other part of right foot with other specified severity Friedland, Adreanne ([id number]) Procedures A Callus Pairing procedure was performed. by Ricard Dillon, MD. Post procedure Diagnosis Wound #: Same as Pre-Procedure Notes: Callus pairing on right 5th met head. Plan Wound Cleansing: Wound #1 Left,Dorsal Foot: Clean wound with Normal Saline. Wound #2 Right,Dorsal Foot: Clean wound with Normal Saline. Anesthetic (add to Medication List): Wound #1 Left,Dorsal Foot: Topical Lidocaine 4% cream applied to wound bed prior to debridement (In Clinic Only). Wound #2 Right,Dorsal Foot: Topical Lidocaine 4% cream applied to wound bed prior to debridement (In Clinic Only). Topical Lidocaine 4% cream applied to wound bed prior to debridement (In Clinic Only). Primary Wound Dressing: Wound #1 Left,Dorsal Foot: Other: - Bordered Foam Wound #2 Right,Dorsal Foot: Santyl Ointment Secondary Dressing: Wound #2 Right,Dorsal Foot: ABD and Kerlix/Conform - wrapped lightly Dressing Change Frequency: Wound #1 Left,Dorsal Foot: Change Dressing Tuesday and Thursday. Wound #2 Right,Dorsal Foot: Change dressing every day. Follow-up Appointments: Wound #1 Left,Dorsal Foot: Return Appointment in 1 week. Wound #2 Right,Dorsal Foot: Return Appointment in 1 week. Edema Control: Wound #1 Left,Dorsal Foot: Elevate legs to the level of the heart and pump ankles as often as possible Wound #2 Right,Dorsal Foot: Elevate legs to the level of the heart and pump ankles as often as possible The following medication(s) was prescribed: doxycycline hyclate oral 100 mg capsule 1 1 capsule oral BID for 7 days for wound infectiono starting 04/27/2019 1. I continued with Santyl to the dorsal foot. I am concerned about further debridement in this  area in the absence of adequate circulation. I think it is fairly clear and I am fairly confident that the patient has claudication with  minimal activity and at rest. Daidone, Amiliah ([id number]) 2. The swelling/bruising that is come up in the last week is difficult to explain. She has some tenderness on the right. Although I doubt this is infection I felt obligated to put her on doxycycline 100 twice daily for 7 days. This could be ongoing ischemia in this area with tissue damage and inflammation. Doxycycline might help for this as well. She has some degree of this on the other side as well 3. I simply cannot bring her to except any degree of urgency for revascularization attempt on the right. She mentions her stage IIIo Kidney failure, the fact that her sister had an amputation after angiography etc. if and when she feels ready for this she wants to go to [facility] to have it done. I do not sense any degree of urgency here Electronic Signature(s) Signed: 04/27/2019 4:44:44 PM By: Linton Ham MD Entered By: Linton Ham on 04/27/2019 09:47:45 Ensey, Marleen ([id number]) -------------------------------------------------------------------------------- SuperBill Details Patient Name: [name] Date of Service: 04/27/2019 Medical Record Number: [id number] Patient Account Number: [id number] Date of Birth/Sex: 15-Jan-1931 (85 y.o. 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 specified severity E11.621 Type 2 diabetes mellitus with foot ulcer L97.518 Non-pressure chronic ulcer of other part of right foot  with other specified severity Facility Procedures CPT4 Code Description: [id number] 11055 - PARE BENIGN LES; SGL ICD-10 Diagnosis Description L97.518 Non-pressure chronic ulcer of other part of right foot with oth Modifier: er specified seve Quantity: 1 rity Physician Procedures CPT4 Code Description: [id number] 11055 - WC PHYS PARE BENIGN LES; SGL ICD-10 Diagnosis Description L97.518 Non-pressure chronic ulcer of other part of right foot with other Modifier: specified seve Quantity: 1 rity Electronic Signature(s) Signed: 04/27/2019 4:44:44 PM By: Linton Ham MD Entered By: Linton Ham on 04/27/2019 09:48:41

## 2019-04-28 NOTE — Progress Notes (Signed)
[name], [place] ([id number]) Visit Report for 04/27/2019 Arrival Information Details Patient Name: [name], [name] [facility] Date of Service: 04/27/2019 8:45 AM Medical Record Number: [id number] Patient Account Number: [id number] Date of Birth/Sex: 10/06/1930 (85 y.o. F) Treating RN: Army Melia Primary Care Teo Moede: Emily Filbert Other Clinician: Referring Tenicia Gural: Emily Filbert Treating [place] Severs/Extender: Tito Dine in Treatment: 1 Visit Information History Since Last Visit Added or deleted any medications: No Patient Arrived: Wheel Chair Any new allergies or adverse reactions: No Arrival Time: 08:57 Had a fall or experienced change in No Accompanied By: self activities of daily living that may affect Transfer Assistance: None risk of falls: Patient Identification Verified: Yes Signs or symptoms of abuse/neglect since last visito No Patient Has Alerts: Yes Hospitalized since last visit: No Patient Alerts: Patient on Blood Thinner Has Dressing in Place as Prescribed: Yes ABI L 1.19 R 0.48 03/2019 Pain Present Now: No 2-325mg  Aspirin daily Electronic Signature(s) Signed: 04/28/2019 11:42:17 AM By: Gretta Cool, BSN, RN, CWS, Kim RN, BSN Entered By: Gretta Cool, BSN, RN, CWS, Kim on 04/27/2019 09:18:03 [name], [place] ([id number]) -------------------------------------------------------------------------------- Encounter Discharge Information Details Patient Name: [name] Date of Service: 04/27/2019 8:45 AM Medical Record Number: [id number] Patient Account Number: [id number] Date of Birth/Sex: 11/23/1930 (85 y.o. F) Treating RN: Cornell Barman Primary Care Shannah Conteh: Emily Filbert Other Clinician: Referring Peace Jost: Emily Filbert Treating Carron Jaggi/Extender: Tito Dine in Treatment: 1 Encounter Discharge Information Items Discharge Condition: Stable Ambulatory Status: Walker Discharge Destination: Home Transportation: Private Auto Accompanied By: caregiver in facility Schedule  Follow-up Appointment: Yes Clinical Summary of Care: Electronic Signature(s) Signed: 04/28/2019 11:42:17 AM By: Gretta Cool, BSN, RN, CWS, Kim RN, BSN Entered By: Gretta Cool, BSN, RN, CWS, Kim on 04/27/2019 09:31:42 [name], [name] ([id number]) -------------------------------------------------------------------------------- Lower Extremity Assessment Details Patient Name: [name] Date of Service: 04/27/2019 8:45 AM Medical Record Number: [id number] Patient Account Number: [id number] Date of Birth/Sex: 08-07-1930 (85 y.o. F) Treating RN: Army Melia Primary Care [name]: Emily Filbert Other Clinician: Referring Godric Lavell: Emily Filbert Treating Tysen Roesler/Extender: Ricard Dillon Weeks in Treatment: 1 Edema Assessment Assessed: [Left: No] [Right: No] Edema: [Left: No] [Right: No] Vascular Assessment Pulses: Dorsalis Pedis Palpable: [Left:No Yes] [Right:No Yes] Electronic Signature(s) Signed: 04/27/2019 2:16:37 PM By: Army Melia Entered By: Army Melia on 04/27/2019 09:05:26 Manzano, [name] ([id number]) -------------------------------------------------------------------------------- Multi Wound Chart Details Patient Name: [name] Date of Service: 04/27/2019 8:45 AM Medical Record Number: [id number] Patient Account Number: [id number] Date of Birth/Sex: 1931/05/11 (85 y.o. F) Treating RN: Cornell Barman Primary Care Leeandre Nordling: Emily Filbert Other Clinician: Referring Neve Branscomb: Emily Filbert Treating Areyana Leoni/Extender: Tito Dine in Treatment: 1 Vital Signs Height(in): 69 Pulse(bpm): 55 Weight(lbs): 195 Blood Pressure(mmHg): 175/89 Body Mass Index(BMI): 29 Temperature(F): 98.7 Respiratory Rate 16 (breaths/min): Photos: [N/A:N/A] Wound Location: Left Foot - Dorsal Right Foot - Dorsal N/A Wounding Event: Trauma Trauma N/A Primary Etiology: Cellulitis Cellulitis N/A Comorbid History: Cataracts, Arrhythmia, Cataracts, Arrhythmia, N/A Congestive Heart Failure, Congestive  Heart Failure, Hypertension, Type II Hypertension, Type II Diabetes, Rheumatoid Arthritis Diabetes, Rheumatoid Arthritis Date Acquired: 03/24/2019 03/24/2019 N/A Weeks of Treatment: 1 1 N/A Wound Status: Open Open N/A Measurements L x W x D 0.3x0.6x0.1 2.4x2.8x0.1 N/A (cm) Area (cm) : 0.141 5.278 N/A Volume (cm) : 0.014 0.528 N/A % Reduction in Area: 48.70% 20.00% N/A % Reduction in Volume: 48.10% 20.00% N/A Classification: Full Thickness Without Unclassifiable N/A Exposed Support Structures Exudate Amount: Medium Small N/A Exudate Type: Serosanguineous Serosanguineous N/A Exudate Color: red, brown red, brown N/A Wound Margin: Flat and Intact Thickened N/A Granulation  Amount: None Present (0%) None Present (0%) N/A Necrotic Amount: Large (67-100%) Large (67-100%) N/A Necrotic Tissue: Eschar Eschar N/A Exposed Structures: Fascia: No Fascia: No N/A Fat Layer (Subcutaneous Fat Layer (Subcutaneous Tissue) Exposed: No Tissue) Exposed: No Tendon: No Tendon: No Round, Latisa ([id number]) Muscle: No Muscle: No Joint: No Joint: No Bone: No Bone: No Epithelialization: None None N/A Treatment Notes Electronic Signature(s) Signed: 04/28/2019 11:42:17 AM By: Gretta Cool, BSN, RN, CWS, Kim RN, BSN Entered By: Gretta Cool, BSN, RN, CWS, Kim on 04/27/2019 09:18:27 [name], [name] ([id number]) -------------------------------------------------------------------------------- Multi-Disciplinary Care Plan Details Patient Name: [name] Date of Service: 04/27/2019 8:45 AM Medical Record Number: [id number] Patient Account Number: [id number] Date of Birth/Sex: [birth year] (85 y.o. F) Treating RN: Cornell Barman Primary Care Emary Zalar: Emily Filbert Other Clinician: Referring Vasilisa Vore: Emily Filbert Treating Moise Friday/Extender: Tito Dine in Treatment: 1 Active Inactive Necrotic Tissue Nursing Diagnoses: Impaired tissue integrity related to necrotic/devitalized tissue Knowledge deficit related  to management of necrotic/devitalized tissue Goals: Necrotic/devitalized tissue will be minimized in the wound bed Date Initiated: 04/20/2019 Target Resolution Date: 05/04/2019 Goal Status: Active Interventions: Assess patient pain level pre-, during and post procedure and prior to discharge Provide education on necrotic tissue and debridement process Treatment Activities: Apply topical anesthetic as ordered : 04/20/2019 Notes: Orientation to the Wound Care Program Nursing Diagnoses: Knowledge deficit related to the wound healing center program Goals: Patient/caregiver will verbalize understanding of the [place] Date Initiated: 04/20/2019 Target Resolution Date: 05/04/2019 Goal Status: Active Interventions: Provide education on orientation to the wound center Notes: Venous Leg Ulcer Nursing Diagnoses: Actual venous Insuffiency (use after diagnosis is confirmed) Goals: Patient/caregiver will verbalize understanding of disease process and disease management KRIES, Jeslyn ([id number]) Date Initiated: 04/20/2019 Target Resolution Date: 05/04/2019 Goal Status: Active Interventions: Assess peripheral edema status every visit. Notes: Wound/Skin Impairment Nursing Diagnoses: Impaired tissue integrity Goals: Patient/caregiver will verbalize understanding of skin care regimen Date Initiated: 04/20/2019 Target Resolution Date: 05/04/2019 Goal Status: Active Ulcer/skin breakdown will have a volume reduction of 30% by week 4 Date Initiated: 04/20/2019 Target Resolution Date: 05/18/2019 Goal Status: Active Interventions: Assess ulceration(s) every visit Treatment Activities: Skin care regimen initiated : 04/20/2019 Topical wound management initiated : 04/20/2019 Notes: Electronic Signature(s) Signed: 04/28/2019 11:42:17 AM By: Gretta Cool, BSN, RN, CWS, Kim RN, BSN Entered By: Gretta Cool, BSN, RN, CWS, Kim on 04/27/2019 09:18:14 Dann, [name]  ([id number]) -------------------------------------------------------------------------------- Non-Wound Condition Assessment Details Patient Name: [name] Date of Service: 04/27/2019 8:45 AM Medical Record Number: [id number] Patient Account Number: [id number] Date of Birth/Sex: Feb 20, 1931 (85 y.o. F) Treating RN: Cornell Barman Primary Care Sovereign Ramiro: Emily Filbert Other Clinician: Referring Zeniyah Peaster: Emily Filbert Treating Alter Moss/Extender: Tito Dine in Treatment: 1 Non-Wound Condition: Condition: Other Dermatologic Condition Location: Foot Side: Right Electronic Signature(s) Signed: 04/28/2019 11:42:17 AM By: Gretta Cool, BSN, RN, CWS, Kim RN, BSN Entered By: Gretta Cool, BSN, RN, CWS, Kim on 04/27/2019 09:22:05 [name], [place] ([id number]) -------------------------------------------------------------------------------- Pain Assessment Details Patient Name: [name] Date of Service: 04/27/2019 8:45 AM Medical Record Number: [id number] Patient Account Number: [id number] Date of Birth/Sex: 04/27/1931 (85 y.o. F) Treating RN: Army Melia Primary Care Naresh Althaus: Emily Filbert Other Clinician: Referring Keimani Laufer: Emily Filbert Treating Bali Lyn/Extender: Tito Dine in Treatment: 1 Active Problems Location of Pain Severity and Description of Pain Patient Has Paino No Site Locations Pain Management and Medication Current Pain Management: Electronic Signature(s) Signed: 04/27/2019 2:16:37 PM By: Army Melia Entered By: Army Melia on 04/27/2019 08:57:54 [name], [name] ([id number]) -------------------------------------------------------------------------------- Patient/Caregiver Education Details Patient Name:  [name], [name] Date of Service: 04/27/2019 8:45 AM Medical Record Number: [id number] Patient Account Number: [id number] Date of Birth/Gender: [birth year] (85 y.o. F) Treating RN: Cornell Barman Primary Care Physician: Emily Filbert Other Clinician: Referring Physician:  Emily Filbert Treating Physician/Extender: Tito Dine in Treatment: 1 Education Assessment Education Provided To: Patient Education Topics Provided Elevated Blood Sugar/ Impact on Healing: Handouts: Elevated Blood Sugars: How Do They Affect Wound Healing Methods: Demonstration, Explain/Verbal Responses: State content correctly Tissue Oxygenation: Handouts: Peripheral Arterial Disease and Related Ulcers Methods: Demonstration Responses: State content correctly Electronic Signature(s) Signed: 04/28/2019 11:42:17 AM By: Gretta Cool, BSN, RN, CWS, Kim RN, BSN Entered By: Gretta Cool, BSN, RN, CWS, Kim on 04/27/2019 09:24:14 [name], [name] ([id number]) -------------------------------------------------------------------------------- Wound Assessment Details Patient Name: [name] Date of Service: 04/27/2019 8:45 AM Medical Record Number: [id number] Patient Account Number: [id number] Date of Birth/Sex: 29-Nov-1930 (85 y.o. F) Treating RN: Army Melia Primary Care Madalena Kesecker: Emily Filbert Other Clinician: Referring Louann Hopson: Emily Filbert Treating [name]/Extender: Tito Dine in Treatment: 1 Wound Status Wound Number: 1 Primary Cellulitis Etiology: Wound Location: Left Foot - Dorsal Wound Open Wounding Event: Trauma Status: Date Acquired: 03/24/2019 Comorbid Cataracts, Arrhythmia, Congestive Heart Weeks Of Treatment: 1 History: Failure, Hypertension, Type II Diabetes, Clustered Wound: No Rheumatoid Arthritis Photos Wound Measurements Length: (cm) 0.3 Width: (cm) 0.6 Depth: (cm) 0.1 Area: (cm) 0.141 Volume: (cm) 0.014 % Reduction in Area: 48.7% % Reduction in Volume: 48.1% Epithelialization: None Tunneling: No Undermining: No Wound Description Full Thickness Without Exposed Support Classification: Structures Wound Margin: Flat and Intact Exudate Medium Amount: Exudate Type: Serosanguineous Exudate Color: red, brown Foul Odor After Cleansing:  No Slough/Fibrino Yes Wound Bed Granulation Amount: None Present (0%) Exposed Structure Necrotic Amount: Large (67-100%) Fascia Exposed: No Necrotic Quality: Eschar Fat Layer (Subcutaneous Tissue) Exposed: No Tendon Exposed: No Muscle Exposed: No Joint Exposed: No Bone Exposed: No [name], [name] ([id number]) Electronic Signature(s) Signed: 04/27/2019 2:16:37 PM By: Army Melia Entered By: Army Melia on 04/27/2019 09:03:14 [name], [name] ([id number]) -------------------------------------------------------------------------------- Wound Assessment Details Patient Name: [name] Date of Service: 04/27/2019 8:45 AM Medical Record Number: [id number] Patient Account Number: [id number] Date of Birth/Sex: 09/13/1930 (85 y.o. F) Treating RN: Army Melia Primary Care Julen Rubert: Emily Filbert Other Clinician: Referring Ardenia Stiner: Emily Filbert Treating [name]/Extender: Tito Dine in Treatment: 1 Wound Status Wound Number: 2 Primary Cellulitis Etiology: Wound Location: Right Foot - Dorsal Wound Open Wounding Event: Trauma Status: Date Acquired: 03/24/2019 Comorbid Cataracts, Arrhythmia, Congestive Heart Weeks Of Treatment: 1 History: Failure, Hypertension, Type II Diabetes, Clustered Wound: No Rheumatoid Arthritis Photos Wound Measurements Length: (cm) 2.4 % Reduction Width: (cm) 2.8 % Reduction Depth: (cm) 0.1 Epitheliali Area: (cm) 5.278 Tunneling: Volume: (cm) 0.528 Underminin in Area: 20% in Volume: 20% zation: None No g: No Wound Description Classification: Unclassifiable Foul Odor A Wound Margin: Thickened Slough/Fibr Exudate Amount: Small Exudate Type: Serosanguineous Exudate Color: red, brown fter Cleansing: No ino Yes Wound Bed Granulation Amount: None Present (0%) Exposed Structure Necrotic Amount: Large (67-100%) Fascia Exposed: No Necrotic Quality: Eschar Fat Layer (Subcutaneous Tissue) Exposed: No Tendon Exposed: No Muscle Exposed:  No Joint Exposed: No Bone Exposed: No Treatment Notes [name], [name] ([id number]) Wound #2 (Right, Dorsal Foot) Notes Right - santyl, gauze, abd and conform; Left: Prisma Electronic Signature(s) Signed: 04/27/2019 2:16:37 PM By: Army Melia Entered By: Army Melia on 04/27/2019 09:03:37 [name], [name] ([id number]) -------------------------------------------------------------------------------- Vitals Details Patient Name: [name] Date of Service: 04/27/2019 8:45 AM Medical Record Number: [id number] Patient Account Number: [id number] Date  of Birth/Sex: 06/20/1930 (85 y.o. F) Treating RN: Army Melia Primary Care Lavelle Berland: Emily Filbert Other Clinician: Referring Lanika Colgate: Emily Filbert Treating Avyaan Summer/Extender: Tito Dine in Treatment: 1 Vital Signs Time Taken: 08:57 Temperature (F): 98.7 Height (in): 69 Pulse (bpm): 55 Weight (lbs): 195 Respiratory Rate (breaths/min): 16 Body Mass Index (BMI): 28.8 Blood Pressure (mmHg): 175/89 Reference Range: 80 - 120 mg / dl Electronic Signature(s) Signed: 04/27/2019 2:16:37 PM By: Army Melia Entered By: Army Melia on 04/27/2019 09:00:12

## 2019-05-02 DIAGNOSIS — L97519 Non-pressure chronic ulcer of other part of right foot with unspecified severity: Secondary | ICD-10-CM

## 2019-05-02 DIAGNOSIS — I10 Essential (primary) hypertension: Secondary | ICD-10-CM

## 2019-05-04 ENCOUNTER — Encounter: Payer: No Typology Code available for payment source | Admitting: Internal Medicine

## 2019-05-04 ENCOUNTER — Other Ambulatory Visit: Payer: Self-pay

## 2019-05-04 DIAGNOSIS — E1152 Type 2 diabetes mellitus with diabetic peripheral angiopathy with gangrene: Secondary | ICD-10-CM | POA: Diagnosis not present

## 2019-05-09 ENCOUNTER — Other Ambulatory Visit (INDEPENDENT_AMBULATORY_CARE_PROVIDER_SITE_OTHER): Payer: Self-pay | Admitting: Vascular Surgery

## 2019-05-09 DIAGNOSIS — S81809A Unspecified open wound, unspecified lower leg, initial encounter: Secondary | ICD-10-CM

## 2019-05-10 ENCOUNTER — Encounter (INDEPENDENT_AMBULATORY_CARE_PROVIDER_SITE_OTHER): Payer: Self-pay

## 2019-05-10 ENCOUNTER — Ambulatory Visit (INDEPENDENT_AMBULATORY_CARE_PROVIDER_SITE_OTHER): Payer: Medicare Other

## 2019-05-10 ENCOUNTER — Ambulatory Visit (INDEPENDENT_AMBULATORY_CARE_PROVIDER_SITE_OTHER): Payer: Medicare Other | Admitting: Vascular Surgery

## 2019-05-10 ENCOUNTER — Other Ambulatory Visit: Payer: Self-pay

## 2019-05-10 ENCOUNTER — Encounter (INDEPENDENT_AMBULATORY_CARE_PROVIDER_SITE_OTHER): Payer: Self-pay | Admitting: Vascular Surgery

## 2019-05-10 VITALS — BP 159/83 | HR 67 | Resp 20 | Ht 69.0 in | Wt 195.0 lb

## 2019-05-10 DIAGNOSIS — E118 Type 2 diabetes mellitus with unspecified complications: Secondary | ICD-10-CM

## 2019-05-10 DIAGNOSIS — I4819 Other persistent atrial fibrillation: Secondary | ICD-10-CM | POA: Diagnosis not present

## 2019-05-10 DIAGNOSIS — I10 Essential (primary) hypertension: Secondary | ICD-10-CM

## 2019-05-10 DIAGNOSIS — N1831 Chronic kidney disease, stage 3a: Secondary | ICD-10-CM

## 2019-05-10 DIAGNOSIS — I7025 Atherosclerosis of native arteries of other extremities with ulceration: Secondary | ICD-10-CM | POA: Diagnosis not present

## 2019-05-10 NOTE — Assessment & Plan Note (Signed)
Oertli would like to limit contrast with the procedure.  Her renal function was far worse when she was getting antibiotics couple of months ago but her most recent creatinine clearance were in the 50 range.

## 2019-05-10 NOTE — Patient Instructions (Signed)

## 2019-05-10 NOTE — Assessment & Plan Note (Signed)
The patient clearly has a critical and limb threatening situation.  Her renal function has returned to a much safer level and at this point we should proceed with angiography and possible intervention.  I had a long discussion about the grave nature of the disease.  The patient seems insistent on having other studies before proceeding with angiogram but I would not recommend a CT scan due to the increased dye load, she has already had ABIs which show severe disease and at this point we should really proceed with angiogram.  I have also tried to stress the time sensitive nature of the matter and the longer we wait the higher the risk of infection and limb loss.  She is adamant that she will not proceed with angiography until she talks to multiple people about this which is obviously her prerogative.  We will be happy to perform the angiogram at any point and I have spent an extended amount of time today with the patient trying to answer all of her questions and make her understand the serious nature of the situation and why we would like to proceed with intervention.  The longer she remains ischemic, the more difficult time we are going to have getting her foot ulcer healed.

## 2019-05-10 NOTE — Assessment & Plan Note (Signed)
blood pressure control important in reducing the progression of atherosclerotic disease. On appropriate oral medications.  

## 2019-05-10 NOTE — Assessment & Plan Note (Signed)
The possibility of an embolus to the right lower extremity some weeks or months ago is certainly possible that this may be contributing to her lower extremity ischemia on top of peripheral arterial disease

## 2019-05-10 NOTE — Progress Notes (Signed)
Patient ID: [name], female   DOB: Dec 29, 1930, 85 y.o.   MRN: [id number]  Chief Complaint  Patient presents with  . New Patient (Initial Visit)    Bilateral foot wound    HPI [name] is a 85 y.o. female.  I am asked to see the patient by Dr. Dellia Nims for evaluation of PAD with ulceration of the right foot.  The patient was in the hospital about 6 weeks ago for a right foot wound with infection.  She has been treated extensively with several rounds of antibiotics to get the infection under control.  When she was in the hospital, she was seen by my partner and discussions for revascularization were undertaken but she had acute kidney injury and the decision was to hold off on any intervention at that time.  Since that time, she has been getting excellent local wound care but the wound is not improving.  ABIs were done which were 0.48 on the right side with the wound and 1 on the left side.  She has a longstanding history of diabetes and atrial fibrillation.  The right foot is painful and cool.  The wound is on the top of the foot and was dressed today so was not undressed.  There are also a couple of small scabs and dark areas on the first and second toes and forefoot.  The patient is very anxious and leery of having anything done stating that her sister in law has just lost her leg for peripheral arterial disease.     Past Medical History:  Diagnosis Date  . Atrial fibrillation ([place])   . Coronary artery disease   . Diabetes mellitus without complication ([place])   . Hypertension   . Meningioma ([place])    Left frontal lobe    Past Surgical History:  Procedure Laterality Date  . APPENDECTOMY    . CHOLECYSTECTOMY    . MELANOMA REMOVAL Right      Family History  Family history unknown: Yes  Her brother has had carotid disease and peripheral vascular disease. Has a sister-in-law who has lost her leg from PAD after multiple interventions and surgeries.   Social History   Tobacco  Use  . Smoking status: Never Smoker  . Smokeless tobacco: Never Used  Substance Use Topics  . Alcohol use: Yes    Alcohol/week: 1.0 standard drinks    Types: 1 Glasses of wine per week  . Drug use: No    Allergies  Allergen Reactions  . Alendronate Other (See Comments)    Cramping of the jaw  . Iodinated Diagnostic Agents Other (See Comments)    Brother has allergy and she wants not to receive it   . Statins Other (See Comments)    Fatigue/muscle weakness Fatigue/muscle weakness Fatigue/muscle weakness    Current Outpatient Medications  Medication Sig Dispense Refill  . aspirin 325 MG EC tablet Take 325 mg by mouth 2 (two) times daily.     . furosemide (LASIX) 20 MG tablet     . Insulin Detemir (LEVEMIR FLEXPEN) 100 UNIT/ML Pen Inject 5-10 Units into the skin 2 (two) times daily. 5 units QAM 10 units QPM    . LOSARTAN POTASSIUM PO Take 100 mg by mouth daily.    . metFORMIN (GLUCOPHAGE) 1000 MG tablet 1,000 mg 2 (two) times daily with a meal.     . metoprolol tartrate (LOPRESSOR) 25 MG tablet Take 12.5 mg by mouth 2 (two) times daily.    Marland Kitchen saccharomyces  boulardii (FLORASTOR) 250 MG capsule Take 250 mg by mouth daily.    Marland Kitchen spironolactone (ALDACTONE) 25 MG tablet     . amLODipine (NORVASC) 5 MG tablet Take 1 tablet (5 mg total) by mouth daily.     No current facility-administered medications for this visit.     REVIEW OF SYSTEMS (Negative unless checked)   Constitutional: '[]' Weight loss  '[]' Fever  '[]' Chills Cardiac: '[]' Chest pain   '[]' Chest pressure   '[x]' Palpitations   '[]' Shortness of breath when laying flat   '[]' Shortness of breath at rest   '[]' Shortness of breath with exertion. Vascular:  '[]' Pain in legs with walking   '[]' Pain in legs at rest   '[]' Pain in legs when laying flat   '[]' Claudication   '[]' Pain in feet when walking  '[]' Pain in feet at rest  '[]' Pain in feet when laying flat   '[]' History of DVT   '[]' Phlebitis   '[]' Swelling in legs   '[]' Varicose veins   '[x]' Non-healing ulcers Pulmonary:    '[]' Uses home oxygen   '[]' Productive cough   '[]' Hemoptysis   '[]' Wheeze  '[]' COPD   '[]' Asthma Neurologic:  '[]' Dizziness  '[]' Blackouts   '[]' Seizures   '[]' History of stroke   '[]' History of TIA  '[]' Aphasia   '[]' Temporary blindness   '[]' Dysphagia   '[]' Weakness or numbness in arms   '[x]' Weakness or numbness in legs Musculoskeletal:  '[x]' Arthritis   '[x]' Joint swelling   '[]' Joint pain   '[]' Low back pain Hematologic:  '[]' Easy bruising  '[]' Easy bleeding   '[]' Hypercoagulable state   '[]' Anemic  '[]' Hepatitis Gastrointestinal:  '[]' Blood in stool   '[]' Vomiting blood  '[]' Gastroesophageal reflux/heartburn   '[]' Difficulty swallowing. Genitourinary:  '[x]' Chronic kidney disease   '[]' Difficult urination  '[]' Frequent urination  '[]' Burning with urination   '[]' Blood in urine Skin:  '[]' Rashes   '[x]' Ulcers   '[x]' Wounds Psychological:  '[]' History of anxiety   '[]'  History of major depression.    Physical Exam BP (!) 159/83 (BP Location: Left Arm)   Pulse 67   Resp 20   Ht '5\' 9"'  (1.753 m)   Wt 195 lb (88.5 kg)   BMI 28.80 kg/m  Gen:  WD/WN, NAD Head: [place]/AT, No temporalis wasting.  Ear/Nose/Throat: Hearing grossly intact, nares w/o erythema or drainage, oropharynx w/o Erythema/Exudate Eyes: Conjunctiva clear, sclera non-icteric  Neck: trachea midline.  No JVD.  Pulmonary:  Good air movement, respirations not labored, no use of accessory muscles  Cardiac: irregular Vascular:  Vessel Right Left  Radial Palpable Palpable                          DP Not palpable 2+  PT Not palpable 1+   Gastrointestinal:. No masses, surgical incisions, or scars. Musculoskeletal: M/S 5/5 throughout.  Wound on the top of the right foot dressed currently.  There are several small dark scab areas on the right first and second toe.  Mild bilateral lower extremity edema. Neurologic: Sensation grossly intact in extremities.  Symmetrical.  Speech is fluent. Motor exam as listed above. Psychiatric: Judgment intact, Mood & affect appropriate for pt's clinical  situation. Dermatologic: Right foot wound as above    Radiology No results found.  Labs Recent Results (from the past 2160 hour(s))  Basic metabolic panel     Status: Abnormal   Collection Time: 03/29/19  5:58 PM  Result Value Ref Range   Sodium 137 135 - 145 mmol/L   Potassium 4.2 3.5 - 5.1 mmol/L   Chloride 99 98 - 111 mmol/L   CO2  21 (L) 22 - 32 mmol/L   Glucose, Bld 135 (H) 70 - 99 mg/dL   BUN 85 (H) 8 - 23 mg/dL   Creatinine, Ser 1.97 (H) 0.44 - 1.00 mg/dL   Calcium 9.9 8.9 - 10.3 mg/dL   GFR calc non Af Amer 22 (L) >60 mL/min   GFR calc Af Amer 26 (L) >60 mL/min   Anion gap 17 (H) 5 - 15    Comment: Performed at [facility], [address] [place], [place] [address]  CBC     Status: Abnormal   Collection Time: 03/29/19  5:58 PM  Result Value Ref Range   WBC 10.4 4.0 - 10.5 K/uL   RBC 3.77 (L) 3.87 - 5.11 MIL/uL   Hemoglobin 12.3 12.0 - 15.0 g/dL   HCT 37.3 36.0 - 46.0 %   MCV 98.9 80.0 - 100.0 fL   MCH 32.6 26.0 - 34.0 pg   MCHC 33.0 30.0 - 36.0 g/dL   RDW 14.2 11.5 - 15.5 %   Platelets 350 150 - 400 K/uL   nRBC 0.0 0.0 - 0.2 %    Comment: Performed at [facility], [address] [place], [place] [address]  Blood Culture (routine x 2)     Status: None   Collection Time: 03/29/19  7:06 PM   Specimen: BLOOD  Result Value Ref Range   Specimen Description BLOOD RIGHT ANTECUBITAL    Special Requests      BOTTLES DRAWN AEROBIC AND ANAEROBIC Blood Culture adequate volume   Culture      NO GROWTH 5 DAYS Performed at [facility], [address] [place], [place] [address]    Report Status 04/03/2019 FINAL   Blood Culture (routine x 2)     Status: None   Collection Time: 03/29/19  7:17 PM   Specimen: BLOOD  Result Value Ref Range   Specimen Description BLOOD BLOOD RIGHT ARM    Special Requests      BOTTLES DRAWN AEROBIC AND ANAEROBIC Blood Culture adequate volume   Culture      NO GROWTH 5 DAYS Performed at [facility], [address] [place],  [address]    Report Status 04/03/2019 FINAL   Lactic acid, plasma     Status: Abnormal   Collection Time: 03/29/19  7:17 PM  Result Value Ref Range   Lactic Acid, Venous 3.2 (HH) 0.5 - 1.9 mmol/L    Comment: CRITICAL RESULT CALLED TO, READ BACK BY AND VERIFIED WITH BUTCH WOODS 03/29/19 @ 2009  [place] Performed at [facility], [address] [place], [place] [address]   SARS CORONAVIRUS 2 (TAT 6-24 HRS) Nasopharyngeal Nasopharyngeal Swab     Status: None   Collection Time: 03/29/19  7:17 PM   Specimen: Nasopharyngeal Swab  Result Value Ref Range   SARS Coronavirus 2 NEGATIVE NEGATIVE    Comment: (NOTE) SARS-CoV-2 target nucleic acids are NOT DETECTED. The SARS-CoV-2 RNA is generally detectable in upper and lower respiratory specimens during the acute phase of infection. Negative results do not preclude SARS-CoV-2 infection, do not rule out co-infections with other pathogens, and should not be used as the sole basis for treatment or other patient management decisions. Negative results must be combined with clinical observations, patient history, and epidemiological information. The expected result is Negative. Fact Sheet for Patients: SugarRoll.be Fact Sheet for Healthcare Providers: https://www.woods-mathews.com/ This test is not yet approved or cleared by the [place] and  has been authorized  for detection and/or diagnosis of SARS-CoV-2 by FDA under an Emergency Use Authorization (EUA). This EUA will remain  in effect (meaning this test can be used) for the duration of the COVID-19 declaration under Section 56 4(b)(1) of the Act, 21 U.S.C. section 360bbb-3(b)(1), unless the authorization is terminated or revoked sooner. Performed at [facility], [place] [address], [place], [place] [address]   Basic metabolic panel     Status: Abnormal   Collection Time: 03/30/19  5:40 AM   Result Value Ref Range   Sodium 138 135 - 145 mmol/L   Potassium 4.6 3.5 - 5.1 mmol/L   Chloride 99 98 - 111 mmol/L   CO2 23 22 - 32 mmol/L   Glucose, Bld 174 (H) 70 - 99 mg/dL   BUN 76 (H) 8 - 23 mg/dL   Creatinine, Ser 1.72 (H) 0.44 - 1.00 mg/dL   Calcium 8.9 8.9 - 10.3 mg/dL   GFR calc non Af Amer 26 (L) >60 mL/min   GFR calc Af Amer 31 (L) >60 mL/min   Anion gap 16 (H) 5 - 15    Comment: Performed at [facility], [address] [place], [place] [address]  CBC     Status: Abnormal   Collection Time: 03/30/19  5:40 AM  Result Value Ref Range   WBC 7.4 4.0 - 10.5 K/uL   RBC 3.19 (L) 3.87 - 5.11 MIL/uL   Hemoglobin 10.5 (L) 12.0 - 15.0 g/dL   HCT 31.9 (L) 36.0 - 46.0 %   MCV 100.0 80.0 - 100.0 fL   MCH 32.9 26.0 - 34.0 pg   MCHC 32.9 30.0 - 36.0 g/dL   RDW 14.2 11.5 - 15.5 %   Platelets 292 150 - 400 K/uL   nRBC 0.0 0.0 - 0.2 %    Comment: Performed at [facility], [address] [place], [place] [address]  Lactic acid, plasma     Status: None   Collection Time: 03/30/19  8:52 AM  Result Value Ref Range   Lactic Acid, Venous 1.1 0.5 - 1.9 mmol/L    Comment: Performed at [facility], [address] [place], [place] [address]  Glucose, capillary     Status: Abnormal   Collection Time: 03/30/19  9:12 AM  Result Value Ref Range   Glucose-Capillary 142 (H) 70 - 99 mg/dL  Glucose, capillary     Status: Abnormal   Collection Time: 03/30/19 11:42 AM  Result Value Ref Range   Glucose-Capillary 119 (H) 70 - 99 mg/dL   Comment 1 Notify RN    Comment 2 Document in Chart   Glucose, capillary     Status: Abnormal   Collection Time: 03/30/19  5:27 PM  Result Value Ref Range   Glucose-Capillary 148 (H) 70 - 99 mg/dL  esr     Status: Abnormal   Collection Time: 03/30/19  7:23 PM  Result Value Ref Range   Sed Rate 35 (H) 0 - 30 mm/hr    Comment: Performed at [facility], [address] [place], [place] [address]  CRP     Status: None    Collection Time: 03/30/19  7:23 PM  Result Value Ref Range   CRP 0.9 <1.0 mg/dL    Comment: Performed at [facility] [address], [place], [place] [address]  Glucose, capillary     Status: Abnormal   Collection Time: 03/30/19  9:07 PM  Result Value Ref Range   Glucose-Capillary 201 (  H) 70 - 99 mg/dL  Vancomycin, random     Status: None   Collection Time: 03/31/19  4:32 AM  Result Value Ref Range   Vancomycin Rm 12     Comment:        Random Vancomycin therapeutic range is dependent on dosage and time of specimen collection. A peak range is 20.0-40.0 ug/mL A trough range is 5.0-15.0 ug/mL        Performed at [facility], [address] [place], [place] [address]   AM - BMET     Status: Abnormal   Collection Time: 03/31/19  4:32 AM  Result Value Ref Range   Sodium 141 135 - 145 mmol/L   Potassium 4.3 3.5 - 5.1 mmol/L   Chloride 105 98 - 111 mmol/L   CO2 23 22 - 32 mmol/L   Glucose, Bld 99 70 - 99 mg/dL   BUN 53 (H) 8 - 23 mg/dL   Creatinine, Ser 1.34 (H) 0.44 - 1.00 mg/dL   Calcium 9.7 8.9 - 10.3 mg/dL   GFR calc non Af Amer 36 (L) >60 mL/min   GFR calc Af Amer 41 (L) >60 mL/min   Anion gap 13 5 - 15    Comment: Performed at [facility], [address] [place], [place] [address]  AM - CBC     Status: Abnormal   Collection Time: 03/31/19  4:32 AM  Result Value Ref Range   WBC 7.0 4.0 - 10.5 K/uL   RBC 3.76 (L) 3.87 - 5.11 MIL/uL   Hemoglobin 12.3 12.0 - 15.0 g/dL   HCT 36.1 36.0 - 46.0 %   MCV 96.0 80.0 - 100.0 fL   MCH 32.7 26.0 - 34.0 pg   MCHC 34.1 30.0 - 36.0 g/dL   RDW 14.5 11.5 - 15.5 %   Platelets 331 150 - 400 K/uL   nRBC 0.0 0.0 - 0.2 %    Comment: Performed at [facility], [address] [place], [place] [address]  Glucose, capillary     Status: Abnormal   Collection Time: 03/31/19  7:55 AM  Result Value Ref Range   Glucose-Capillary 110 (H) 70 - 99 mg/dL   Comment 1 Notify RN   Glucose, capillary      Status: Abnormal   Collection Time: 03/31/19 11:58 AM  Result Value Ref Range   Glucose-Capillary 133 (H) 70 - 99 mg/dL   Comment 1 Notify RN   Glucose, capillary     Status: Abnormal   Collection Time: 03/31/19  5:00 PM  Result Value Ref Range   Glucose-Capillary 145 (H) 70 - 99 mg/dL   Comment 1 Notify RN   Glucose, capillary     Status: Abnormal   Collection Time: 03/31/19  9:01 PM  Result Value Ref Range   Glucose-Capillary 152 (H) 70 - 99 mg/dL   Comment 1 Notify RN   AM - BMET     Status: Abnormal   Collection Time: 04/01/19  6:41 AM  Result Value Ref Range   Sodium 141 135 - 145 mmol/L   Potassium 5.2 (H) 3.5 - 5.1 mmol/L   Chloride 106 98 - 111 mmol/L   CO2 25 22 - 32 mmol/L   Glucose, Bld 109 (H) 70 - 99 mg/dL   BUN 47 (H) 8 - 23 mg/dL   Creatinine, Ser 1.17 (H) 0.44 - 1.00 mg/dL   Calcium 9.3 8.9 - 10.3 mg/dL   GFR calc non Af Amer 42 (L) >60  mL/min   GFR calc Af Amer 49 (L) >60 mL/min   Anion gap 10 5 - 15    Comment: Performed at [facility], [address] [place], [place] [address]  AM - CBC     Status: Abnormal   Collection Time: 04/01/19  6:41 AM  Result Value Ref Range   WBC 7.4 4.0 - 10.5 K/uL   RBC 3.36 (L) 3.87 - 5.11 MIL/uL   Hemoglobin 10.9 (L) 12.0 - 15.0 g/dL   HCT 33.8 (L) 36.0 - 46.0 %   MCV 100.6 (H) 80.0 - 100.0 fL   MCH 32.4 26.0 - 34.0 pg   MCHC 32.2 30.0 - 36.0 g/dL   RDW 14.3 11.5 - 15.5 %   Platelets 295 150 - 400 K/uL   nRBC 0.0 0.0 - 0.2 %    Comment: Performed at [facility], [address] [place], [place] [address]  Vancomycin, random     Status: None   Collection Time: 04/01/19  6:41 AM  Result Value Ref Range   Vancomycin Rm 13     Comment:        Random Vancomycin therapeutic range is dependent on dosage and time of specimen collection. A peak range is 20.0-40.0 ug/mL A trough range is 5.0-15.0 ug/mL        Performed at [facility], [address] [place], [place] [address]   Glucose,  capillary     Status: Abnormal   Collection Time: 04/01/19  7:51 AM  Result Value Ref Range   Glucose-Capillary 100 (H) 70 - 99 mg/dL  Glucose, capillary     Status: Abnormal   Collection Time: 04/01/19 11:53 AM  Result Value Ref Range   Glucose-Capillary 163 (H) 70 - 99 mg/dL  Glucose, capillary     Status: Abnormal   Collection Time: 04/01/19  4:51 PM  Result Value Ref Range   Glucose-Capillary 116 (H) 70 - 99 mg/dL  Glucose, capillary     Status: Abnormal   Collection Time: 04/01/19  9:21 PM  Result Value Ref Range   Glucose-Capillary 211 (H) 70 - 99 mg/dL   Comment 1 Notify RN   AM - BMET     Status: Abnormal   Collection Time: 04/02/19  6:34 AM  Result Value Ref Range   Sodium 139 135 - 145 mmol/L   Potassium 4.5 3.5 - 5.1 mmol/L   Chloride 106 98 - 111 mmol/L   CO2 25 22 - 32 mmol/L   Glucose, Bld 107 (H) 70 - 99 mg/dL   BUN 36 (H) 8 - 23 mg/dL   Creatinine, Ser 1.01 (H) 0.44 - 1.00 mg/dL   Calcium 9.1 8.9 - 10.3 mg/dL   GFR calc non Af Amer 50 (L) >60 mL/min   GFR calc Af Amer 58 (L) >60 mL/min   Anion gap 8 5 - 15    Comment: Performed at [facility], [address] [place], [place] [address]  AM - CBC     Status: Abnormal   Collection Time: 04/02/19  6:34 AM  Result Value Ref Range   WBC 7.5 4.0 - 10.5 K/uL   RBC 3.39 (L) 3.87 - 5.11 MIL/uL   Hemoglobin 11.2 (L) 12.0 - 15.0 g/dL   HCT 32.5 (L) 36.0 - 46.0 %   MCV 95.9 80.0 - 100.0 fL   MCH 33.0 26.0 - 34.0 pg   MCHC 34.5 30.0 - 36.0 g/dL   RDW 14.5 11.5 - 15.5 %  Platelets 286 150 - 400 K/uL   nRBC 0.0 0.0 - 0.2 %    Comment: Performed at [facility], [address] [place], [place] [address]  AM - Mg     Status: None   Collection Time: 04/02/19  6:34 AM  Result Value Ref Range   Magnesium 1.9 1.7 - 2.4 mg/dL    Comment: Performed at [facility], [address] [place], [place] [address]  Vancomycin, random     Status: None   Collection Time: 04/02/19  6:34 AM  Result  Value Ref Range   Vancomycin Rm 17     Comment:        Random Vancomycin therapeutic range is dependent on dosage and time of specimen collection. A peak range is 20.0-40.0 ug/mL A trough range is 5.0-15.0 ug/mL        Performed at [facility], [address] [place], [place] [address]   Glucose, capillary     Status: None   Collection Time: 04/02/19  7:44 AM  Result Value Ref Range   Glucose-Capillary 90 70 - 99 mg/dL  Glucose, capillary     Status: Abnormal   Collection Time: 04/02/19 11:40 AM  Result Value Ref Range   Glucose-Capillary 164 (H) 70 - 99 mg/dL  Glucose, capillary     Status: Abnormal   Collection Time: 04/02/19  4:45 PM  Result Value Ref Range   Glucose-Capillary 253 (H) 70 - 99 mg/dL  Glucose, capillary     Status: Abnormal   Collection Time: 04/02/19  9:27 PM  Result Value Ref Range   Glucose-Capillary 148 (H) 70 - 99 mg/dL  AM - BMET     Status: Abnormal   Collection Time: 04/03/19  4:53 AM  Result Value Ref Range   Sodium 138 135 - 145 mmol/L   Potassium 4.2 3.5 - 5.1 mmol/L   Chloride 106 98 - 111 mmol/L   CO2 24 22 - 32 mmol/L   Glucose, Bld 120 (H) 70 - 99 mg/dL   BUN 29 (H) 8 - 23 mg/dL   Creatinine, Ser 0.91 0.44 - 1.00 mg/dL   Calcium 9.0 8.9 - 10.3 mg/dL   GFR calc non Af Amer 57 (L) >60 mL/min   GFR calc Af Amer >60 >60 mL/min   Anion gap 8 5 - 15    Comment: Performed at [facility], [address] [place], [place] [address]  AM - CBC     Status: Abnormal   Collection Time: 04/03/19  4:53 AM  Result Value Ref Range   WBC 7.2 4.0 - 10.5 K/uL   RBC 3.23 (L) 3.87 - 5.11 MIL/uL   Hemoglobin 10.6 (L) 12.0 - 15.0 g/dL   HCT 31.0 (L) 36.0 - 46.0 %   MCV 96.0 80.0 - 100.0 fL   MCH 32.8 26.0 - 34.0 pg   MCHC 34.2 30.0 - 36.0 g/dL   RDW 14.5 11.5 - 15.5 %   Platelets 281 150 - 400 K/uL   nRBC 0.0 0.0 - 0.2 %    Comment: Performed at [facility], [address] [place],  [address]  Glucose, capillary      Status: Abnormal   Collection Time: 04/03/19  8:08 AM  Result Value Ref Range   Glucose-Capillary 113 (H) 70 - 99 mg/dL   Comment 1 Notify RN   Glucose, capillary     Status: Abnormal   Collection Time: 04/03/19 11:57 AM  Result Value Ref  Range   Glucose-Capillary 155 (H) 70 - 99 mg/dL   Comment 1 Notify RN   Glucose, capillary     Status: Abnormal   Collection Time: 04/03/19  4:53 PM  Result Value Ref Range   Glucose-Capillary 212 (H) 70 - 99 mg/dL   Comment 1 Notify RN   Glucose, capillary     Status: Abnormal   Collection Time: 04/03/19  9:50 PM  Result Value Ref Range   Glucose-Capillary 320 (H) 70 - 99 mg/dL   Comment 1 Notify RN   AM - CBC     Status: Abnormal   Collection Time: 04/04/19  3:46 AM  Result Value Ref Range   WBC 7.2 4.0 - 10.5 K/uL   RBC 3.05 (L) 3.87 - 5.11 MIL/uL   Hemoglobin 10.1 (L) 12.0 - 15.0 g/dL   HCT 30.1 (L) 36.0 - 46.0 %   MCV 98.7 80.0 - 100.0 fL   MCH 33.1 26.0 - 34.0 pg   MCHC 33.6 30.0 - 36.0 g/dL   RDW 14.6 11.5 - 15.5 %   Platelets 274 150 - 400 K/uL   nRBC 0.0 0.0 - 0.2 %    Comment: Performed at [facility], [address] [place], [place] [address]  AM - BMET     Status: Abnormal   Collection Time: 04/04/19  3:46 AM  Result Value Ref Range   Sodium 140 135 - 145 mmol/L   Potassium 4.6 3.5 - 5.1 mmol/L   Chloride 110 98 - 111 mmol/L   CO2 23 22 - 32 mmol/L   Glucose, Bld 152 (H) 70 - 99 mg/dL   BUN 29 (H) 8 - 23 mg/dL   Creatinine, Ser 0.89 0.44 - 1.00 mg/dL   Calcium 9.1 8.9 - 10.3 mg/dL   GFR calc non Af Amer 59 (L) >60 mL/min   GFR calc Af Amer >60 >60 mL/min   Anion gap 7 5 - 15    Comment: Performed at [facility], [address] [place], [place] [address]  Glucose, capillary     Status: Abnormal   Collection Time: 04/04/19  7:34 AM  Result Value Ref Range   Glucose-Capillary 119 (H) 70 - 99 mg/dL  Glucose, capillary     Status: Abnormal   Collection Time: 04/04/19 11:49 AM  Result Value Ref  Range   Glucose-Capillary 173 (H) 70 - 99 mg/dL  Glucose, capillary     Status: Abnormal   Collection Time: 04/04/19  4:39 PM  Result Value Ref Range   Glucose-Capillary 195 (H) 70 - 99 mg/dL  Glucose, capillary     Status: Abnormal   Collection Time: 04/04/19  9:58 PM  Result Value Ref Range   Glucose-Capillary 271 (H) 70 - 99 mg/dL  AM - CBC     Status: Abnormal   Collection Time: 04/05/19  4:20 AM  Result Value Ref Range   WBC 7.8 4.0 - 10.5 K/uL   RBC 3.19 (L) 3.87 - 5.11 MIL/uL   Hemoglobin 10.5 (L) 12.0 - 15.0 g/dL   HCT 30.5 (L) 36.0 - 46.0 %   MCV 95.6 80.0 - 100.0 fL   MCH 32.9 26.0 - 34.0 pg   MCHC 34.4 30.0 - 36.0 g/dL   RDW 14.4 11.5 - 15.5 %   Platelets 272 150 - 400 K/uL   nRBC 0.0 0.0 - 0.2 %    Comment: Performed at [facility], [address] [place],  [address]  AM - BMET  Status: Abnormal   Collection Time: 04/05/19  4:20 AM  Result Value Ref Range   Sodium 138 135 - 145 mmol/L   Potassium 5.7 (H) 3.5 - 5.1 mmol/L   Chloride 107 98 - 111 mmol/L   CO2 26 22 - 32 mmol/L   Glucose, Bld 137 (H) 70 - 99 mg/dL   BUN 25 (H) 8 - 23 mg/dL   Creatinine, Ser 0.87 0.44 - 1.00 mg/dL   Calcium 9.3 8.9 - 10.3 mg/dL   GFR calc non Af Amer >60 >60 mL/min   GFR calc Af Amer >60 >60 mL/min   Anion gap 5 5 - 15    Comment: Performed at [facility], [address] [place], [place] [address]  Glucose, capillary     Status: Abnormal   Collection Time: 04/05/19  7:51 AM  Result Value Ref Range   Glucose-Capillary 103 (H) 70 - 99 mg/dL  Glucose, capillary     Status: Abnormal   Collection Time: 04/05/19 12:14 PM  Result Value Ref Range   Glucose-Capillary 188 (H) 70 - 99 mg/dL  Glucose, capillary     Status: Abnormal   Collection Time: 04/05/19  4:38 PM  Result Value Ref Range   Glucose-Capillary 157 (H) 70 - 99 mg/dL  Glucose, capillary     Status: Abnormal   Collection Time: 04/05/19  9:05 PM  Result Value Ref Range    Glucose-Capillary 266 (H) 70 - 99 mg/dL   Comment 1 Notify RN   AM - BMET     Status: Abnormal   Collection Time: 04/06/19  3:57 AM  Result Value Ref Range   Sodium 138 135 - 145 mmol/L   Potassium 4.6 3.5 - 5.1 mmol/L   Chloride 104 98 - 111 mmol/L   CO2 25 22 - 32 mmol/L   Glucose, Bld 151 (H) 70 - 99 mg/dL   BUN 24 (H) 8 - 23 mg/dL   Creatinine, Ser 0.82 0.44 - 1.00 mg/dL   Calcium 9.1 8.9 - 10.3 mg/dL   GFR calc non Af Amer >60 >60 mL/min   GFR calc Af Amer >60 >60 mL/min   Anion gap 9 5 - 15    Comment: Performed at [facility], [address] [place], [place] [address]  AM - CBC     Status: Abnormal   Collection Time: 04/06/19  3:57 AM  Result Value Ref Range   WBC 8.0 4.0 - 10.5 K/uL   RBC 2.99 (L) 3.87 - 5.11 MIL/uL   Hemoglobin 9.9 (L) 12.0 - 15.0 g/dL   HCT 28.4 (L) 36.0 - 46.0 %   MCV 95.0 80.0 - 100.0 fL   MCH 33.1 26.0 - 34.0 pg   MCHC 34.9 30.0 - 36.0 g/dL   RDW 14.4 11.5 - 15.5 %   Platelets 280 150 - 400 K/uL   nRBC 0.0 0.0 - 0.2 %    Comment: Performed at [facility], [address] [place], [place]  [address]  Glucose, capillary     Status: Abnormal   Collection Time: 04/06/19  7:33 AM  Result Value Ref Range   Glucose-Capillary 123 (H) 70 - 99 mg/dL   Comment 1 Notify RN   Glucose, capillary     Status: Abnormal   Collection Time: 04/06/19 11:42 AM  Result Value Ref Range   Glucose-Capillary 180 (H) 70 - 99 mg/dL   Comment 1 Notify RN   Glucose, capillary     Status: Abnormal  Collection Time: 04/06/19  4:46 PM  Result Value Ref Range   Glucose-Capillary 227 (H) 70 - 99 mg/dL   Comment 1 Notify RN   Glucose, capillary     Status: Abnormal   Collection Time: 04/06/19  9:48 PM  Result Value Ref Range   Glucose-Capillary 212 (H) 70 - 99 mg/dL   Comment 1 Notify RN   AM - CBC     Status: Abnormal   Collection Time: 04/07/19  5:25 AM  Result Value Ref Range   WBC 6.6 4.0 - 10.5 K/uL   RBC 2.96 (L) 3.87 - 5.11 MIL/uL    Hemoglobin 9.8 (L) 12.0 - 15.0 g/dL   HCT 28.1 (L) 36.0 - 46.0 %   MCV 94.9 80.0 - 100.0 fL   MCH 33.1 26.0 - 34.0 pg   MCHC 34.9 30.0 - 36.0 g/dL   RDW 14.3 11.5 - 15.5 %   Platelets 259 150 - 400 K/uL   nRBC 0.0 0.0 - 0.2 %    Comment: Performed at [facility], [address] [place], [place] [address]  AM - BMET     Status: Abnormal   Collection Time: 04/07/19  5:25 AM  Result Value Ref Range   Sodium 139 135 - 145 mmol/L   Potassium 4.5 3.5 - 5.1 mmol/L   Chloride 104 98 - 111 mmol/L   CO2 26 22 - 32 mmol/L   Glucose, Bld 140 (H) 70 - 99 mg/dL   BUN 26 (H) 8 - 23 mg/dL   Creatinine, Ser 0.97 0.44 - 1.00 mg/dL   Calcium 9.1 8.9 - 10.3 mg/dL   GFR calc non Af Amer 53 (L) >60 mL/min   GFR calc Af Amer >60 >60 mL/min   Anion gap 9 5 - 15    Comment: Performed at [facility], [address] [place], [place] [address]  Glucose, capillary     Status: Abnormal   Collection Time: 04/07/19  8:13 AM  Result Value Ref Range   Glucose-Capillary 125 (H) 70 - 99 mg/dL   Comment 1 Notify RN   Glucose, capillary     Status: Abnormal   Collection Time: 04/07/19 12:52 PM  Result Value Ref Range   Glucose-Capillary 138 (H) 70 - 99 mg/dL   Comment 1 Notify RN   Troponin I (High Sensitivity)     Status: None   Collection Time: 04/07/19  3:22 PM  Result Value Ref Range   Troponin I (High Sensitivity) 11 <18 ng/L    Comment: (NOTE) Elevated high sensitivity troponin I (hsTnI) values and significant  changes across serial measurements may suggest ACS but many other  chronic and acute conditions are known to elevate hsTnI results.  Refer to the "Links" section for chest pain algorithms and additional  guidance. Performed at [facility], [address] [place], [place] [address]   Glucose, capillary     Status: Abnormal   Collection Time: 04/07/19  4:22 PM  Result Value Ref Range   Glucose-Capillary 240 (H) 70 - 99 mg/dL   Comment 1 Notify RN     Troponin I (High Sensitivity)     Status: None   Collection Time: 04/07/19  5:30 PM  Result Value Ref Range   Troponin I (High Sensitivity) 13 <18 ng/L    Comment: (NOTE) Elevated high sensitivity troponin I (hsTnI) values and significant  changes across serial measurements may suggest ACS but many other  chronic and acute conditions are known to  elevate hsTnI results.  Refer to the "Links" section for chest pain algorithms and additional  guidance. Performed at [facility], [address] [place], [place] [address]   Glucose, capillary     Status: Abnormal   Collection Time: 04/07/19  9:13 PM  Result Value Ref Range   Glucose-Capillary 134 (H) 70 - 99 mg/dL  Glucose, capillary     Status: Abnormal   Collection Time: 04/08/19  8:30 AM  Result Value Ref Range   Glucose-Capillary 149 (H) 70 - 99 mg/dL   Comment 1 Notify RN   Glucose, capillary     Status: Abnormal   Collection Time: 04/08/19 11:40 AM  Result Value Ref Range   Glucose-Capillary 191 (H) 70 - 99 mg/dL  Glucose, capillary     Status: Abnormal   Collection Time: 04/08/19  5:14 PM  Result Value Ref Range   Glucose-Capillary 229 (H) 70 - 99 mg/dL   Comment 1 Notify RN     Assessment/Plan:  Essential (primary) hypertension blood pressure control important in reducing the progression of atherosclerotic disease. On appropriate oral medications.   Persistent atrial fibrillation (HCC) The possibility of an embolus to the right lower extremity some weeks or months ago is certainly possible that this may be contributing to her lower extremity ischemia on top of peripheral arterial disease  Diabetes mellitus type 2, controlled, with complications ([place]) blood glucose control important in reducing the progression of atherosclerotic disease. Also, involved in wound healing. On appropriate medications.  CKD stage G3a/A1, GFR 45-59 and albumin creatinine ratio <30 mg/g Oertli would like to limit contrast with  the procedure.  Her renal function was far worse when she was getting antibiotics couple of months ago but her most recent creatinine clearance were in the 50 range.      Leotis Pain 05/10/2019, 5:52 PM   This note was created with Dragon medical transcription system.  Any errors from dictation are unintentional.

## 2019-05-10 NOTE — Assessment & Plan Note (Signed)
blood glucose control important in reducing the progression of atherosclerotic disease. Also, involved in wound healing. On appropriate medications.  

## 2019-05-11 ENCOUNTER — Encounter: Payer: No Typology Code available for payment source | Admitting: Internal Medicine

## 2019-05-11 DIAGNOSIS — E1152 Type 2 diabetes mellitus with diabetic peripheral angiopathy with gangrene: Secondary | ICD-10-CM | POA: Diagnosis not present

## 2019-05-12 NOTE — Progress Notes (Signed)
[place], Georgi ([id number]) Visit Report for 05/11/2019 Debridement Details Patient Name: [name], [name] [facility] Date of Service: 05/11/2019 10:30 AM Medical Record Number: [id number] Patient Account Number: [id number] Date of Birth/Sex: [birth year] (85 y.o. F) Treating RN: Primary Care Provider: Emily Filbert Other Clinician: Referring Provider: Emily Filbert Treating Provider/Extender: Tito Dine in Treatment: 3 Debridement Performed for Wound #2 Right,Dorsal Foot Assessment: Performed By: Physician Ricard Dillon, MD Debridement Type: Debridement Level of Consciousness (Pre- Awake and Alert procedure): Pre-procedure Verification/Time Yes - 10:57 Out Taken: Start Time: 10:57 Pain Control: Lidocaine Total Area Debrided (L x W): 3.2 (cm) x 2.8 (cm) = 8.96 (cm) Tissue and other material Non-Viable, Eschar, Subcutaneous debrided: Level: Skin/Subcutaneous Tissue Debridement Description: Excisional Instrument: Blade, Forceps Bleeding: Minimum Hemostasis Achieved: Pressure End Time: 10:58 Procedural Pain: 5 Post Procedural Pain: 0 Response to Treatment: Procedure was tolerated well Level of Consciousness Awake and Alert (Post-procedure): Post Debridement Measurements of Total Wound Length: (cm) 3.2 Width: (cm) 2.8 Depth: (cm) 0.1 Volume: (cm) 0.704 Character of Wound/Ulcer Post Debridement: Improved Post Procedure Diagnosis Same as Pre-procedure Electronic Signature(s) Signed: 05/11/2019 4:48:37 PM By: Linton Ham MD Entered By: Linton Ham on 05/11/2019 11:40:52 Pavlov, Jadeyn ([id number]) -------------------------------------------------------------------------------- HPI Details Patient Name: [name] Date of Service: 05/11/2019 10:30 AM Medical Record Number: [id number] Patient Account Number: [id number] Date of Birth/Sex: 01-24-1931 (85 y.o. F) Treating RN: Primary Care Provider: Emily Filbert Other Clinician: Referring Provider: Emily Filbert Treating  Provider/Extender: Tito Dine in Treatment: 3 History of Present Illness HPI Description: ADMISSION 04/20/2019 This is an 85 year old woman who lives in the independent part of [place]. She is a type II diabetic on oral agents and insulin with a recent hemoglobin A1c of 6.3 on 10/6. She was initially seen by primary care on 11/12 after dropping a heavy walker on her bilateral dorsal feet. She had blisters on the right and left lateral foot. She was given doxycycline. She was seen the next day in [place] clinic on 11/13 continued on the.C. Bilateral foot x-rays were negative. She was admitted to hospital from 11/17 through 11/27 with acute kidney injury I did not research this although the patient tells me that at discharge her kidney function had returned to normal. During the course of this hospitalization it was noted she had wounds on her bilateral feet and her peripheral pulses were nonpalpable. She was evaluated by Dr. Delana Meyer. It was recommended that she consider angiography on the right leg and it was unlikely that she would heal the right leg as her ABI here was 0.48 with monophasic waveforms on the left her ABI was 1.19 with biphasic waveforms. One would wonder if the ABI was artificially elevated on the left. In any case Dr. Cydney Ok note accurately describes the patient's current state of mind. She does not want to rush into an intervention and she is here for our review of her wounds. Noteworthy that when she arrived I think her renal insufficiency was felt to be prerenal. Her GFR on arrival was 22 now 36. Past medical history includes type 2 diabetes with peripheral neuropathy, atrial fibrillation, renal insufficiency We did not calculate ABIs in our clinic this was 0.48 on the right and 1.19 on the left as noted above. Monophasic waveforms on the right biphasic on the left 12/16; patient admitted to the clinic last week with trauma critical limb ischemia on the  right. Today her wound on the left dorsal foot is healed. She still has the black eschar on the  dorsal right foot that we have been using Santyl on. There is apparently some drainage. She arrives in clinic today with more swelling on the right great and the left dorsal surrounding her foot wounds. Some erythema especially on the right. There is some tenderness on the right but not as much as I might expect if this was all infection nevertheless he does have some neuropathy which might be blunting discomfort. She reports no additional trauma. She is at the rehab section of [facility], she lives in the independent part of [place]. She tells me she has some pain in the right leg at night which wakes her up from sleeping although she manages to get back to sleep. She has about 150 foot exercise tolerance and then gets fatigue in her legs. Again I discussed the issue of angiogram with the patient especially on the right. She just will not agree to this. She states she wants the rehab and then go to [facility] where a list of her specialists. She was on Levaquin last week we did not prescribe this but she is finished. She describes some nausea and loose stools but does not sound dramatically unwell with this Left dorsal foot had some very superficial areas today I think related to swelling. The big problem is on the dorsal right foot black surface eschar with increased swelling. She does not have any pain or tenderness although she has neuropathy. We have been using Santyl. She has PAD and again I think needs an angiogram. 12/30; 2-week follow-up. The areas on the right dorsal foot. Everything is healed on the left. She is still in the rehab part of [place]. She went to see Dr. Lucky Cowboy on 12/29. He reiterated to her he felt that she needed angiography on the right leg for critical limb ischemia. She seemed to have 2 major concerns that is wanting to see a nephrologist prior to procedure in consultation.  She does not currently have a nephrologist. She also had no confidence in the original arterial studies that were done in the hospital and wanted them redone. I do not think Dr. Lucky Cowboy felt that they needed to be redone and wanted to proceed with angiography. At the end of it he simply stated that he would be glad to do the angiogram at any time the patient agreed to proceed [place], [place] ([id number]) I looked briefly at her kidney function which had normalized by the time she left the hospital in late November. I do not know that the patient has any additional concern at this point other than the reduction in GFR associated with her diabetes and normal aging. Electronic Signature(s) Signed: 05/11/2019 4:48:37 PM By: Linton Ham MD Entered By: Linton Ham on 05/11/2019 11:48:30 [place], Atiya ([id number]) -------------------------------------------------------------------------------- Physical Exam Details Patient Name: [name] Date of Service: 05/11/2019 10:30 AM Medical Record Number: [id number] Patient Account Number: [id number] Date of Birth/Sex: 10-10-1930 (85 y.o. F) Treating RN: Primary Care Provider: Emily Filbert Other Clinician: Referring Provider: Emily Filbert Treating Provider/Extender: Tito Dine in Treatment: 3 Constitutional Patient is hypertensive.. Pulse regular and within target range for patient.Marland Kitchen Respirations regular, non-labored and within target range.. Temperature is normal and within the target range for the patient.Marland Kitchen appears in no distress. Cardiovascular Femoral pulses palpable not the popliteal on the right. Pedal pulses are nonpalpable in the right foot. The foot is cool. Psychiatric No evidence of depression, anxiety, or agitation. Calm, cooperative, and communicative. Appropriate interactions and affect.. Notes Wound exam; oOn  the right side the black ischemic eschar on top of the wound was already separating. Using pickups and a #15  scalpel I removed this and some necrotic subcutaneous tissue underneath. There is exposed fat layer I think possibly some muscle layer here as well. There is no evidence of surrounding infection Electronic Signature(s) Signed: 05/11/2019 4:48:37 PM By: Linton Ham MD Entered By: Linton Ham on 05/11/2019 11:51:09 Dion, Stella ([id number]) -------------------------------------------------------------------------------- Physician Orders Details Patient Name: [name] Date of Service: 05/11/2019 10:30 AM Medical Record Number: [id number] Patient Account Number: [id number] Date of Birth/Sex: [birth year] (85 y.o. F) Treating RN: Harold Barban Primary Care Provider: Emily Filbert Other Clinician: Referring Provider: Emily Filbert Treating Provider/Extender: Tito Dine in Treatment: 3 Verbal / Phone Orders: No Diagnosis Coding Wound Cleansing Wound #2 Right,Dorsal Foot o Clean wound with Normal Saline. Anesthetic (add to Medication List) Wound #2 Right,Dorsal Foot o Topical Lidocaine 4% cream applied to wound bed prior to debridement (In Clinic Only). Primary Wound Dressing Wound #2 Right,Dorsal Foot o Santyl Ointment Secondary Dressing Wound #2 Right,Dorsal Foot o Boardered Foam Dressing Dressing Change Frequency Wound #2 Right,Dorsal Foot o Change dressing every day. Follow-up Appointments Wound #2 Right,Dorsal Foot o Return Appointment in 1 week. Edema Control Wound #2 Right,Dorsal Foot o Elevate legs to the level of the heart and pump ankles as often as possible Electronic Signature(s) Signed: 05/11/2019 3:13:29 PM By: Harold Barban Signed: 05/11/2019 4:48:37 PM By: Linton Ham MD Entered By: Harold Barban on 05/11/2019 11:01:37 Welch, Chynah ([id number]) -------------------------------------------------------------------------------- Problem List Details Patient Name: [name] Date of Service: 05/11/2019 10:30 AM Medical  Record Number: [id number] Patient Account Number: [id number] Date of Birth/Sex: Apr 08, 1931 (85 y.o. F) Treating RN: Primary Care Provider: Emily Filbert Other Clinician: Referring Provider: Emily Filbert Treating Provider/Extender: Tito Dine in Treatment: 3 Active Problems ICD-10 Evaluated Encounter Code Description Active Date Today Diagnosis E11.52 Type 2 diabetes mellitus with diabetic peripheral angiopathy 04/20/2019 No Yes with gangrene E11.42 Type 2 diabetes mellitus with diabetic polyneuropathy 04/20/2019 No Yes L97.528 Non-pressure chronic ulcer of other part of left foot with other 04/20/2019 No Yes specified severity E11.621 Type 2 diabetes mellitus with foot ulcer 04/20/2019 No Yes L97.518 Non-pressure chronic ulcer of other part of right foot with 04/20/2019 No Yes other specified severity Inactive Problems Resolved Problems Electronic Signature(s) Signed: 05/11/2019 4:48:37 PM By: Linton Ham MD Entered By: Linton Ham on 05/11/2019 11:37:43 [place], Salinda ([id number]) -------------------------------------------------------------------------------- Progress Note Details Patient Name: [name] Date of Service: 05/11/2019 10:30 AM Medical Record Number: [id number] Patient Account Number: [id number] Date of Birth/Sex: [birth year] (85 y.o. F) Treating RN: Primary Care Provider: Emily Filbert Other Clinician: Referring Provider: Emily Filbert Treating Provider/Extender: Tito Dine in Treatment: 3 Subjective History of Present Illness (HPI) ADMISSION 04/20/2019 This is an 85 year old woman who lives in the independent part of [place]. She is a type II diabetic on oral agents and insulin with a recent hemoglobin A1c of 6.3 on 10/6. She was initially seen by primary care on 11/12 after dropping a heavy walker on her bilateral dorsal feet. She had blisters on the right and left lateral foot. She was given doxycycline. She was seen the next day in  [place] clinic on 11/13 continued on the.C. Bilateral foot x-rays were negative. She was admitted to hospital from 11/17 through 11/27 with acute kidney injury I did not research this although the patient tells me that at discharge her kidney function had returned to normal. During the course  of this hospitalization it was noted she had wounds on her bilateral feet and her peripheral pulses were nonpalpable. She was evaluated by Dr. Delana Meyer. It was recommended that she consider angiography on the right leg and it was unlikely that she would heal the right leg as her ABI here was 0.48 with monophasic waveforms on the left her ABI was 1.19 with biphasic waveforms. One would wonder if the ABI was artificially elevated on the left. In any case Dr. Cydney Ok note accurately describes the patient's current state of mind. She does not want to rush into an intervention and she is here for our review of her wounds. Noteworthy that when she arrived I think her renal insufficiency was felt to be prerenal. Her GFR on arrival was 22 now 36. Past medical history includes type 2 diabetes with peripheral neuropathy, atrial fibrillation, renal insufficiency We did not calculate ABIs in our clinic this was 0.48 on the right and 1.19 on the left as noted above. Monophasic waveforms on the right biphasic on the left 12/16; patient admitted to the clinic last week with trauma critical limb ischemia on the right. Today her wound on the left dorsal foot is healed. She still has the black eschar on the dorsal right foot that we have been using Santyl on. There is apparently some drainage. She arrives in clinic today with more swelling on the right great and the left dorsal surrounding her foot wounds. Some erythema especially on the right. There is some tenderness on the right but not as much as I might expect if this was all infection nevertheless he does have some neuropathy which might be blunting discomfort. She reports  no additional trauma. She is at the rehab section of [facility], she lives in the independent part of [place]. She tells me she has some pain in the right leg at night which wakes her up from sleeping although she manages to get back to sleep. She has about 150 foot exercise tolerance and then gets fatigue in her legs. Again I discussed the issue of angiogram with the patient especially on the right. She just will not agree to this. She states she wants the rehab and then go to [facility] where a list of her specialists. She was on Levaquin last week we did not prescribe this but she is finished. She describes some nausea and loose stools but does not sound dramatically unwell with this Left dorsal foot had some very superficial areas today I think related to swelling. The big problem is on the dorsal right foot black surface eschar with increased swelling. She does not have any pain or tenderness although she has neuropathy. We have been using Santyl. She has PAD and again I think needs an angiogram. 12/30; 2-week follow-up. The areas on the right dorsal foot. Everything is healed on the left. She is still in the rehab part of [place]. She went to see Dr. Lucky Cowboy on 12/29. He reiterated to her he felt that she needed angiography on the right leg for critical limb ischemia. She seemed to have 2 major concerns that is wanting to see a nephrologist prior to procedure in consultation. She does not currently have a nephrologist. She also had no confidence in the original arterial studies that were done in the hospital and wanted them redone. I do not think Dr. Lucky Cowboy felt that they needed to be redone and wanted to proceed with angiography. At the end of it he simply stated that  he would be glad to do the angiogram at any time the patient agreed to proceed [place], [place] ([id number]) I looked briefly at her kidney function which had normalized by the time she left the hospital in late November. I do not  know that the patient has any additional concern at this point other than the reduction in GFR associated with her diabetes and normal aging. Objective Constitutional Patient is hypertensive.. Pulse regular and within target range for patient.Marland Kitchen Respirations regular, non-labored and within target range.. Temperature is normal and within the target range for the patient.Marland Kitchen appears in no distress. Vitals Time Taken: 10:28 AM, Height: 69 in, Weight: 195 lbs, BMI: 28.8, Temperature: 98.6 F, Pulse: 66 bpm, Respiratory Rate: 16 breaths/min, Blood Pressure: 152/90 mmHg. Cardiovascular Femoral pulses palpable not the popliteal on the right. Pedal pulses are nonpalpable in the right foot. The foot is cool. Psychiatric No evidence of depression, anxiety, or agitation. Calm, cooperative, and communicative. Appropriate interactions and affect.. General Notes: Wound exam; On the right side the black ischemic eschar on top of the wound was already separating. Using pickups and a #15 scalpel I removed this and some necrotic subcutaneous tissue underneath. There is exposed fat layer I think possibly some muscle layer here as well. There is no evidence of surrounding infection Integumentary (Hair, Skin) Wound #1 status is Healed - Epithelialized. Original cause of wound was Trauma. The wound is located on the Left,Dorsal Foot. The wound measures 0cm length x 0cm width x 0cm depth; 0cm^2 area and 0cm^3 volume. There is a medium amount of serosanguineous drainage noted. The wound margin is flat and intact. There is no granulation within the wound bed. There is a large (67-100%) amount of necrotic tissue within the wound bed including Eschar. Wound #2 status is Open. Original cause of wound was Trauma. The wound is located on the Right,Dorsal Foot. The wound measures 3.2cm length x 2.8cm width x 0.1cm depth; 7.037cm^2 area and 0.704cm^3 volume. There is a small amount of serosanguineous drainage noted. The wound  margin is thickened. There is no granulation within the wound bed. There is a large (67-100%) amount of necrotic tissue within the wound bed including Eschar. Assessment Active Problems ICD-10 Type 2 diabetes mellitus with diabetic peripheral angiopathy with gangrene Type 2 diabetes mellitus with diabetic polyneuropathy Non-pressure chronic ulcer of other part of left foot with other specified severity Type 2 diabetes mellitus with foot ulcer Non-pressure chronic ulcer of other part of right foot with other specified severity [place], Breella ([id number]) Procedures Wound #2 Pre-procedure diagnosis of Wound #2 is a Cellulitis located on the Right,Dorsal Foot . There was a Excisional Skin/Subcutaneous Tissue Debridement with a total area of 8.96 sq cm performed by Ricard Dillon, MD. With the following instrument(s): Blade, and Forceps to remove Non-Viable tissue/material. Material removed includes Eschar and Subcutaneous Tissue and after achieving pain control using Lidocaine. No specimens were taken. A time out was conducted at 10:57, prior to the start of the procedure. A Minimum amount of bleeding was controlled with Pressure. The procedure was tolerated well with a pain level of 5 throughout and a pain level of 0 following the procedure. Post Debridement Measurements: 3.2cm length x 2.8cm width x 0.1cm depth; 0.704cm^3 volume. Character of Wound/Ulcer Post Debridement is improved. Post procedure Diagnosis Wound #2: Same as Pre-Procedure Plan Wound Cleansing: Wound #2 Right,Dorsal Foot: Clean wound with Normal Saline. Anesthetic (add to Medication List): Wound #2 Right,Dorsal Foot: Topical Lidocaine 4% cream applied to wound bed  prior to debridement (In Clinic Only). Primary Wound Dressing: Wound #2 Right,Dorsal Foot: Santyl Ointment Secondary Dressing: Wound #2 Right,Dorsal Foot: Boardered Foam Dressing Dressing Change Frequency: Wound #2 Right,Dorsal Foot: Change dressing  every day. Follow-up Appointments: Wound #2 Right,Dorsal Foot: Return Appointment in 1 week. Edema Control: Wound #2 Right,Dorsal Foot: Elevate legs to the level of the heart and pump ankles as often as possible 1. Once again I went over the same issues with this patient that I went over with her before trying to emphasize the critical nature of her ischemic disease in the right leg and even the possibility of limb loss if this gets infected. 2. With regards to her kidney disease she appears to have stage IIIa chronic renal failure. By itself I do not think this is a contraindication to an angiogram. Proper concern of course would have to be exercised but I think that does not necessarily mean that we would need a nephrology consult. I agree with Dr. Lucky Cowboy there would be no need to proceed to additional testing at this point. [place], Shaneeka ([id number]) 3. I continued with Santyl as the primary dressing to the wound. I will probably change to collagen next week 4. Once again I think this patient has talked herself out of the necessary procedure. I have talked to her about this yet again Electronic Signature(s) Signed: 05/11/2019 4:48:37 PM By: Linton Ham MD Entered By: Linton Ham on 05/11/2019 11:55:02 [place], [place] ([id number]) -------------------------------------------------------------------------------- SuperBill Details Patient Name: [name] Date of Service: 05/11/2019 Medical Record Number: [id number] Patient Account Number: [id number] Date of Birth/Sex: 03-Oct-1930 (85 y.o. F) Treating RN: Primary Care Provider: Emily Filbert Other Clinician: Referring Provider: Emily Filbert Treating Provider/Extender: Tito Dine in Treatment: 3 Diagnosis Coding ICD-10 Codes Code Description E11.52 Type 2 diabetes mellitus with diabetic peripheral angiopathy with gangrene E11.42 Type 2 diabetes mellitus with diabetic polyneuropathy L97.528 Non-pressure chronic ulcer of  other part of left foot with other specified severity E11.621 Type 2 diabetes mellitus with foot ulcer L97.518 Non-pressure chronic ulcer of other part of right foot with other specified severity Facility Procedures CPT4 Code Description: [id number] - DEB SUBQ TISSUE 20 SQ CM/< ICD-10 Diagnosis Description L97.518 Non-pressure chronic ulcer of other part of right foot with othe E11.52 Type 2 diabetes mellitus with diabetic peripheral angiopathy wit Modifier: r specified seve h gangrene Quantity: 1 rity Physician Procedures CPT4 Code Description: [id number] - WC PHYS SUBQ TISS 20 SQ CM ICD-10 Diagnosis Description L97.518 Non-pressure chronic ulcer of other part of right foot with other E11.52 Type 2 diabetes mellitus with diabetic peripheral angiopathy with Modifier: specified seve gangrene Quantity: 1 rity Electronic Signature(s) Signed: 05/11/2019 4:48:37 PM By: Linton Ham MD Entered By: Linton Ham on 05/11/2019 11:55:21

## 2019-05-12 NOTE — Progress Notes (Signed)
[place], [place] ([id number]) Visit Report for 05/11/2019 Arrival Information Details Patient Name: [name], [name] [facility] Date of Service: 05/11/2019 10:30 AM Medical Record Number: [id number] Patient Account Number: [id number] Date of Birth/Sex: Jul 15, 1930 (85 y.o. F) Treating RN: Army Melia Primary Care Sydney Hasten: Emily Filbert Other Clinician: Referring Samya Siciliano: Emily Filbert Treating Haven Foss/Extender: Tito Dine in Treatment: 3 Visit Information History Since Last Visit Added or deleted any medications: No Patient Arrived: Wheel Chair Any new allergies or adverse reactions: No Arrival Time: 10:27 Had a fall or experienced change in No Accompanied By: caregiver activities of daily living that may affect Transfer Assistance: None risk of falls: Patient Identification Verified: Yes Signs or symptoms of abuse/neglect since last visito No Patient Has Alerts: Yes Has Dressing in Place as Prescribed: Yes Patient Alerts: Patient on Blood Thinner Pain Present Now: No ABI L 1.19 R 0.48 03/2019 2-325mg  Aspirin daily Electronic Signature(s) Signed: 05/11/2019 2:59:41 PM By: Army Melia Entered By: Army Melia on 05/11/2019 10:28:15 Leveque, Shakelia ([id number]) -------------------------------------------------------------------------------- Encounter Discharge Information Details Patient Name: [name] Date of Service: 05/11/2019 10:30 AM Medical Record Number: [id number] Patient Account Number: [id number] Date of Birth/Sex: [birth year] (85 y.o. F) Treating RN: Harold Barban Primary Care Rance Smithson: Emily Filbert Other Clinician: Referring Braylie Badami: Emily Filbert Treating Gleason Ardoin/Extender: Tito Dine in Treatment: 3 Encounter Discharge Information Items Post Procedure Vitals Discharge Condition: Stable Temperature (F): 98.6 Ambulatory Status: Wheelchair Pulse (bpm): 66 Discharge Destination: Home Respiratory Rate (breaths/min): 18 Transportation: Private  Auto Blood Pressure (mmHg): 152/90 Accompanied By: caregiver Schedule Follow-up Appointment: Yes Clinical Summary of Care: Electronic Signature(s) Signed: 05/11/2019 3:13:29 PM By: Harold Barban Entered By: Harold Barban on 05/11/2019 11:03:19 Deziel, Jenette ([id number]) -------------------------------------------------------------------------------- Lower Extremity Assessment Details Patient Name: [name] Date of Service: 05/11/2019 10:30 AM Medical Record Number: [id number] Patient Account Number: [id number] Date of Birth/Sex: [birth year] (85 y.o. F) Treating RN: Army Melia Primary Care Steffen Hase: Emily Filbert Other Clinician: Referring Shiron Whetsel: Emily Filbert Treating Taren Dymek/Extender: Ricard Dillon Weeks in Treatment: 3 Edema Assessment Assessed: [Left: No] [Right: No] Edema: [Left: No] [Right: No] Vascular Assessment Pulses: Dorsalis Pedis Palpable: [Left:Yes] [Right:Yes] Electronic Signature(s) Signed: 05/11/2019 2:59:41 PM By: Army Melia Entered By: Army Melia on 05/11/2019 10:33:21 Arviso, Elleana ([id number]) -------------------------------------------------------------------------------- Multi Wound Chart Details Patient Name: [name] Date of Service: 05/11/2019 10:30 AM Medical Record Number: [id number] Patient Account Number: [id number] Date of Birth/Sex: [birth year] (85 y.o. F) Treating RN: Harold Barban Primary Care Imani Fiebelkorn: Emily Filbert Other Clinician: Referring Renatha Rosen: Emily Filbert Treating Junior Huezo/Extender: Tito Dine in Treatment: 3 Vital Signs Height(in): 56 Pulse(bpm): 85 Weight(lbs): 195 Blood Pressure(mmHg): 152/90 Body Mass Index(BMI): 29 Temperature(F): 98.6 Respiratory Rate 16 (breaths/min): Photos: [N/A:N/A] Wound Location: Left, Dorsal Foot Right Foot - Dorsal N/A Wounding Event: Trauma Trauma N/A Primary Etiology: Cellulitis Cellulitis N/A Comorbid History: Cataracts, Arrhythmia, Cataracts, Arrhythmia,  N/A Congestive Heart Failure, Congestive Heart Failure, Hypertension, Type II Hypertension, Type II Diabetes, Rheumatoid Arthritis Diabetes, Rheumatoid Arthritis Date Acquired: 03/24/2019 03/24/2019 N/A Weeks of Treatment: 3 3 N/A Wound Status: Healed - Epithelialized Open N/A Measurements L x W x D 0x0x0 3.2x2.8x0.1 N/A (cm) Area (cm) : 0 7.037 N/A Volume (cm) : 0 0.704 N/A % Reduction in Area: 100.00% -6.70% N/A % Reduction in Volume: 100.00% -6.70% N/A Classification: Full Thickness Without Unclassifiable N/A Exposed Support Structures Exudate Amount: Medium Small N/A Exudate Type: Serosanguineous Serosanguineous N/A Exudate Color: red, brown red, brown N/A Wound Margin: Flat and Intact Thickened N/A Granulation Amount: None Present (0%) None Present (0%)  N/A Necrotic Amount: Large (67-100%) Large (67-100%) N/A Necrotic Tissue: Eschar Eschar N/A Exposed Structures: Fascia: No Fascia: No N/A Fat Layer (Subcutaneous Fat Layer (Subcutaneous Tissue) Exposed: No Tissue) Exposed: No Tendon: No Tendon: No Glaeser, Shelitha ([id number]) Muscle: No Muscle: No Joint: No Joint: No Bone: No Bone: No Epithelialization: None None N/A Debridement: N/A Debridement - Selective/Open N/A Wound Pre-procedure N/A 10:57 N/A Verification/Time Out Taken: Pain Control: N/A Lidocaine N/A Tissue Debrided: N/A Necrotic/Eschar N/A Level: N/A Non-Viable Tissue N/A Debridement Area (sq cm): N/A 8.96 N/A Instrument: N/A Blade, Forceps N/A Bleeding: N/A Minimum N/A Hemostasis Achieved: N/A Pressure N/A Procedural Pain: N/A 5 N/A Post Procedural Pain: N/A 0 N/A Debridement Treatment N/A Procedure was tolerated well N/A Response: Post Debridement N/A 3.2x2.8x0.1 N/A Measurements L x W x D (cm) Post Debridement Volume: N/A 0.704 N/A (cm) Procedures Performed: N/A Debridement N/A Treatment Notes Wound #2 (Right, Dorsal Foot) Notes Santyl, bordered foam Electronic Signature(s) Signed:  05/11/2019 4:48:37 PM By: Linton Ham MD Entered By: Linton Ham on 05/11/2019 11:38:31 Lye, Tyaira ([id number]) -------------------------------------------------------------------------------- Multi-Disciplinary Care Plan Details Patient Name: [name] Date of Service: 05/11/2019 10:30 AM Medical Record Number: [id number] Patient Account Number: [id number] Date of Birth/Sex: Nov 14, 1930 (85 y.o. F) Treating RN: Harold Barban Primary Care Jameyah Fennewald: Emily Filbert Other Clinician: Referring Lorean Ekstrand: Emily Filbert Treating Chayim Bialas/Extender: Tito Dine in Treatment: 3 Active Inactive Necrotic Tissue Nursing Diagnoses: Impaired tissue integrity related to necrotic/devitalized tissue Knowledge deficit related to management of necrotic/devitalized tissue Goals: Necrotic/devitalized tissue will be minimized in the wound bed Date Initiated: 04/20/2019 Target Resolution Date: 05/04/2019 Goal Status: Active Interventions: Assess patient pain level pre-, during and post procedure and prior to discharge Provide education on necrotic tissue and debridement process Treatment Activities: Apply topical anesthetic as ordered : 04/20/2019 Notes: Orientation to the Wound Care Program Nursing Diagnoses: Knowledge deficit related to the wound healing center program Goals: Patient/caregiver will verbalize understanding of the [place] Date Initiated: 04/20/2019 Target Resolution Date: 05/04/2019 Goal Status: Active Interventions: Provide education on orientation to the wound center Notes: Venous Leg Ulcer Nursing Diagnoses: Actual venous Insuffiency (use after diagnosis is confirmed) Goals: Patient/caregiver will verbalize understanding of disease process and disease management ESTELA, Slyvia ([id number]) Date Initiated: 04/20/2019 Target Resolution Date: 05/04/2019 Goal Status: Active Interventions: Assess peripheral edema status every  visit. Notes: Wound/Skin Impairment Nursing Diagnoses: Impaired tissue integrity Goals: Patient/caregiver will verbalize understanding of skin care regimen Date Initiated: 04/20/2019 Target Resolution Date: 05/04/2019 Goal Status: Active Ulcer/skin breakdown will have a volume reduction of 30% by week 4 Date Initiated: 04/20/2019 Target Resolution Date: 05/18/2019 Goal Status: Active Interventions: Assess ulceration(s) every visit Treatment Activities: Skin care regimen initiated : 04/20/2019 Topical wound management initiated : 04/20/2019 Notes: Electronic Signature(s) Signed: 05/11/2019 3:13:29 PM By: Harold Barban Entered By: Harold Barban on 05/11/2019 10:54:35 Loschiavo, McCook ([id number]) -------------------------------------------------------------------------------- Pain Assessment Details Patient Name: [name] Date of Service: 05/11/2019 10:30 AM Medical Record Number: [id number] Patient Account Number: [id number] Date of Birth/Sex: Nov 29, 1930 (85 y.o. F) Treating RN: Army Melia Primary Care Margrett Kalb: Emily Filbert Other Clinician: Referring Akiva Josey: Emily Filbert Treating Penni Penado/Extender: Tito Dine in Treatment: 3 Active Problems Location of Pain Severity and Description of Pain Patient Has Paino No Site Locations Pain Management and Medication Current Pain Management: Electronic Signature(s) Signed: 05/11/2019 2:59:41 PM By: Army Melia Entered By: Army Melia on 05/11/2019 10:28:20 Eyerman, Allison ([id number]) -------------------------------------------------------------------------------- Patient/Caregiver Education Details Patient Name: [name] Date of Service: 05/11/2019 10:30 AM Medical Record  Number: [id number] Patient Account Number: [id number] Date of Birth/Gender: 05/01/1931 (85 y.o. F) Treating RN: Harold Barban Primary Care Physician: Emily Filbert Other Clinician: Referring Physician: Emily Filbert Treating Physician/Extender:  Tito Dine in Treatment: 3 Education Assessment Education Provided To: Patient Education Topics Provided Wound/Skin Impairment: Handouts: Caring for Your Ulcer Methods: Demonstration, Explain/Verbal Responses: State content correctly Electronic Signature(s) Signed: 05/11/2019 3:13:29 PM By: Harold Barban Entered By: Harold Barban on 05/11/2019 10:55:02 Los Ybanez, [place] ([id number]) -------------------------------------------------------------------------------- Wound Assessment Details Patient Name: [name] Date of Service: 05/11/2019 10:30 AM Medical Record Number: [id number] Patient Account Number: [id number] Date of Birth/Sex: 1930/07/29 (85 y.o. F) Treating RN: Harold Barban Primary Care Lanesha Azzaro: Emily Filbert Other Clinician: Referring Leani Myron: Emily Filbert Treating Jaquavious Mercer/Extender: Tito Dine in Treatment: 3 Wound Status Wound Number: 1 Primary Cellulitis Etiology: Wound Location: Left, Dorsal Foot Wound Healed - Epithelialized Wounding Event: Trauma Status: Date Acquired: 03/24/2019 Comorbid Cataracts, Arrhythmia, Congestive Heart Weeks Of Treatment: 3 History: Failure, Hypertension, Type II Diabetes, Clustered Wound: No Rheumatoid Arthritis Photos Wound Measurements Length: (cm) 0 Width: (cm) 0 Depth: (cm) 0 Area: (cm) 0 Volume: (cm) 0 % Reduction in Area: 100% % Reduction in Volume: 100% Epithelialization: None Wound Description Full Thickness Without Exposed Support Classification: Structures Wound Margin: Flat and Intact Exudate Medium Amount: Exudate Type: Serosanguineous Exudate Color: red, brown Foul Odor After Cleansing: No Slough/Fibrino Yes Wound Bed Granulation Amount: None Present (0%) Exposed Structure Necrotic Amount: Large (67-100%) Fascia Exposed: No Necrotic Quality: Eschar Fat Layer (Subcutaneous Tissue) Exposed: No Tendon Exposed: No Muscle Exposed: No Joint Exposed: No Bone Exposed:  No Dunker, Allanah ([id number]) Electronic Signature(s) Signed: 05/11/2019 3:13:29 PM By: Harold Barban Entered By: Harold Barban on 05/11/2019 11:00:00 [place], Allyn ([id number]) -------------------------------------------------------------------------------- Wound Assessment Details Patient Name: [name] Date of Service: 05/11/2019 10:30 AM Medical Record Number: [id number] Patient Account Number: [id number] Date of Birth/Sex: 01/14/1931 (85 y.o. F) Treating RN: Army Melia Primary Care Abdifatah Colquhoun: Emily Filbert Other Clinician: Referring Evaleigh Mccamy: Emily Filbert Treating Elic Vencill/Extender: Tito Dine in Treatment: 3 Wound Status Wound Number: 2 Primary Cellulitis Etiology: Wound Location: Right Foot - Dorsal Wound Open Wounding Event: Trauma Status: Date Acquired: 03/24/2019 Comorbid Cataracts, Arrhythmia, Congestive Heart Weeks Of Treatment: 3 History: Failure, Hypertension, Type II Diabetes, Clustered Wound: No Rheumatoid Arthritis Photos Wound Measurements Length: (cm) 3.2 % Reduction Width: (cm) 2.8 % Reduction Depth: (cm) 0.1 Epitheliali Area: (cm) 7.037 Volume: (cm) 0.704 in Area: -6.7% in Volume: -6.7% zation: None Wound Description Classification: Unclassifiable Foul Odor A Wound Margin: Thickened Slough/Fibr Exudate Amount: Small Exudate Type: Serosanguineous Exudate Color: red, brown fter Cleansing: No ino Yes Wound Bed Granulation Amount: None Present (0%) Exposed Structure Necrotic Amount: Large (67-100%) Fascia Exposed: No Necrotic Quality: Eschar Fat Layer (Subcutaneous Tissue) Exposed: No Tendon Exposed: No Muscle Exposed: No Joint Exposed: No Bone Exposed: No Treatment Notes Gloss, Ligia ([id number]) Wound #2 (Right, Dorsal Foot) Notes Santyl, bordered foam Electronic Signature(s) Signed: 05/11/2019 2:59:41 PM By: Army Melia Entered By: Army Melia on 05/11/2019 10:32:32 Mehlberg, Anel  ([id number]) -------------------------------------------------------------------------------- Vitals Details Patient Name: [name] Date of Service: 05/11/2019 10:30 AM Medical Record Number: [id number] Patient Account Number: [id number] Date of Birth/Sex: 01-12-1931 (85 y.o. F) Treating RN: Army Melia Primary Care Nova Evett: Emily Filbert Other Clinician: Referring Brithany Whitworth: Emily Filbert Treating Roshanna Cimino/Extender: Tito Dine in Treatment: 3 Vital Signs Time Taken: 10:28 Temperature (F): 98.6 Height (in): 69 Pulse (bpm): 66 Weight (lbs): 195 Respiratory Rate (breaths/min): 16 Body Mass Index (BMI): 28.8  Blood Pressure (mmHg): 152/90 Reference Range: 80 - 120 mg / dl Electronic Signature(s) Signed: 05/11/2019 11:53:24 AM By: Lorine Bears RCP, RRT, CHT Entered By: Lorine Bears on 05/11/2019 10:36:33

## 2019-05-17 DIAGNOSIS — N1831 Chronic kidney disease, stage 3a: Secondary | ICD-10-CM | POA: Diagnosis not present

## 2019-05-17 DIAGNOSIS — I10 Essential (primary) hypertension: Secondary | ICD-10-CM

## 2019-05-18 ENCOUNTER — Encounter: Payer: Medicare Other | Admitting: Internal Medicine

## 2019-05-18 NOTE — Progress Notes (Signed)
[place], [place] ([id number]) Visit Report for 05/04/2019 Arrival Information Details Patient Name: [name], [name] [facility] Date of Service: 05/04/2019 9:15 AM Medical Record Number: [id number] Patient Account Number: [id number] Date of Birth/Sex: 07-25-1930 (86 y.o. F) Treating RN: Army Melia Primary Care Maxyne Derocher: Emily Filbert Other Clinician: Referring Dimetri Armitage: Emily Filbert Treating Airen Stiehl/Extender: Tito Dine in Treatment: 2 Visit Information History Since Last Visit Added or deleted any medications: No Patient Arrived: Wheel Chair Any new allergies or adverse reactions: No Arrival Time: 09:23 Had a fall or experienced change in No Accompanied By: self activities of daily living that may affect Transfer Assistance: None risk of falls: Patient Identification Verified: Yes Signs or symptoms of abuse/neglect since last visito No Patient Has Alerts: Yes Hospitalized since last visit: No Patient Alerts: Patient on Blood Thinner Has Dressing in Place as Prescribed: Yes ABI L 1.19 R 0.48 03/2019 Pain Present Now: No 2-325mg  Aspirin daily Electronic Signature(s) Signed: 05/04/2019 11:52:26 AM By: Army Melia Entered By: Army Melia on 05/04/2019 09:23:59 [name] [name] ([id number]) -------------------------------------------------------------------------------- Clinic Level of Care Assessment Details Patient Name: [name] [name] Date of Service: 05/04/2019 9:15 AM Medical Record Number: [id number] Patient Account Number: [id number] Date of Birth/Sex: 1930-11-21 (86 y.o. F) Treating RN: Cornell Barman Primary Care Verlinda Slotnick: Emily Filbert Other Clinician: Referring Jacayla Nordell: Emily Filbert Treating Sanjith Siwek/Extender: Tito Dine in Treatment: 2 Clinic Level of Care Assessment Items TOOL 4 Quantity Score []  - Use when only an EandM is performed on FOLLOW-UP visit 0 ASSESSMENTS - Nursing Assessment / Reassessment []  - Reassessment of Co-morbidities (includes updates in  patient status) 0 X- 1 5 Reassessment of Adherence to Treatment Plan ASSESSMENTS - Wound and Skin Assessment / Reassessment []  - Simple Wound Assessment / Reassessment - one wound 0 X- 2 5 Complex Wound Assessment / Reassessment - multiple wounds []  - 0 Dermatologic / Skin Assessment (not related to wound area) ASSESSMENTS - Focused Assessment []  - Circumferential Edema Measurements - multi extremities 0 []  - 0 Nutritional Assessment / Counseling / Intervention []  - 0 Lower Extremity Assessment (monofilament, tuning fork, pulses) []  - 0 Peripheral Arterial Disease Assessment (using hand held doppler) ASSESSMENTS - Ostomy and/or Continence Assessment and Care []  - Incontinence Assessment and Management 0 []  - 0 Ostomy Care Assessment and Management (repouching, etc.) PROCESS - Coordination of Care X - Simple Patient / Family Education for ongoing care 1 15 []  - 0 Complex (extensive) Patient / Family Education for ongoing care []  - 0 Staff obtains Programmer, systems, Records, Test Results / Process Orders []  - 0 Staff telephones HHA, Nursing Homes / Clarify orders / etc []  - 0 Routine Transfer to another Facility (non-emergent condition) []  - 0 Routine Hospital Admission (non-emergent condition) []  - 0 New Admissions / Biomedical engineer / Ordering NPWT, Apligraf, etc. []  - 0 Emergency Hospital Admission (emergent condition) []  - 0 Simple Discharge Coordination [name] [name] ([id number]) X- 1 15 Complex (extensive) Discharge Coordination PROCESS - Special Needs []  - Pediatric / Minor Patient Management 0 []  - 0 Isolation Patient Management []  - 0 Hearing / Language / Visual special needs []  - 0 Assessment of Community assistance (transportation, D/C planning, etc.) []  - 0 Additional assistance / Altered mentation []  - 0 Support Surface(s) Assessment (bed, cushion, seat, etc.) INTERVENTIONS - Wound Cleansing / Measurement []  - Simple Wound Cleansing - one wound 0 X- 2  5 Complex Wound Cleansing - multiple wounds X- 1 5 Wound Imaging (photographs - any number of wounds) []  - 0 Wound Tracing (instead  of photographs) []  - 0 Simple Wound Measurement - one wound X- 2 5 Complex Wound Measurement - multiple wounds INTERVENTIONS - Wound Dressings []  - Small Wound Dressing one or multiple wounds 0 X- 2 15 Medium Wound Dressing one or multiple wounds []  - 0 Large Wound Dressing one or multiple wounds []  - 0 Application of Medications - topical []  - 0 Application of Medications - injection INTERVENTIONS - Miscellaneous []  - External ear exam 0 []  - 0 Specimen Collection (cultures, biopsies, blood, body fluids, etc.) []  - 0 Specimen(s) / Culture(s) sent or taken to Lab for analysis []  - 0 Patient Transfer (multiple staff / Civil Service fast streamer / Similar devices) []  - 0 Simple Staple / Suture removal (25 or less) []  - 0 Complex Staple / Suture removal (26 or more) []  - 0 Hypo / Hyperglycemic Management (close monitor of Blood Glucose) []  - 0 Ankle / Brachial Index (ABI) - do not check if billed separately X- 1 5 Vital Signs [name] [name] ([id number]) Has the patient been seen at the hospital within the last three years: Yes Total Score: 105 Level Of Care: New/Established - Level 3 Electronic Signature(s) Signed: 05/18/2019 4:51:46 PM By: Gretta Cool, BSN, RN, CWS, Kim RN, BSN Entered By: Gretta Cool, BSN, RN, CWS, Kim on 05/04/2019 10:01:52 [name] [name] [name] ([id number]) -------------------------------------------------------------------------------- Encounter Discharge Information Details Patient Name: [name] [name] Date of Service: 05/04/2019 9:15 AM Medical Record Number: [id number] Patient Account Number: [id number] Date of Birth/Sex: [birth year] (86 y.o. F) Treating RN: Cornell Barman Primary Care Colleen Kotlarz: Emily Filbert Other Clinician: Referring Khamryn Calderone: Emily Filbert Treating Isauro Skelley/Extender: Tito Dine in Treatment: 2 Encounter Discharge Information  Items Discharge Condition: Stable Ambulatory Status: Ambulatory Discharge Destination: Home Transportation: Private Auto Accompanied By: self Schedule Follow-up Appointment: Yes Clinical Summary of Care: Electronic Signature(s) Signed: 05/18/2019 4:51:46 PM By: Gretta Cool, BSN, RN, CWS, Kim RN, BSN Entered By: Gretta Cool, BSN, RN, CWS, Kim on 05/04/2019 10:06:09 [name] [name], Kim ([id number]) -------------------------------------------------------------------------------- Lower Extremity Assessment Details Patient Name: [name] [name] Date of Service: 05/04/2019 9:15 AM Medical Record Number: [id number] Patient Account Number: [id number] Date of Birth/Sex: Apr 19, 1931 (86 y.o. F) Treating RN: Army Melia Primary Care Mayreli Alden: Emily Filbert Other Clinician: Referring Cylis Ayars: Emily Filbert Treating Addasyn Mcbreen/Extender: Ricard Dillon Weeks in Treatment: 2 Edema Assessment Assessed: [Left: No] [Right: No] Edema: [Left: Yes] [Right: Yes] Vascular Assessment Pulses: Dorsalis Pedis Palpable: [Left:Yes] [Right:Yes] Electronic Signature(s) Signed: 05/04/2019 11:52:26 AM By: Army Melia Entered By: Army Melia on 05/04/2019 09:29:42 [name] [name] ([id number]) -------------------------------------------------------------------------------- Multi Wound Chart Details Patient Name: [name] [name] Date of Service: 05/04/2019 9:15 AM Medical Record Number: [id number] Patient Account Number: [id number] Date of Birth/Sex: Mar 07, 1931 (86 y.o. F) Treating RN: Cornell Barman Primary Care Mattisyn Cardona: Emily Filbert Other Clinician: Referring Wyatte Dames: Emily Filbert Treating Seven Marengo/Extender: Tito Dine in Treatment: 2 Vital Signs Height(in): 69 Pulse(bpm): 50 Weight(lbs): 195 Blood Pressure(mmHg): 180/77 Body Mass Index(BMI): 29 Temperature(F): 98.5 Respiratory Rate 16 (breaths/min): Photos: [N/A:N/A] Wound Location: Left Foot - Dorsal Right Foot - Dorsal N/A Wounding Event: Trauma Trauma  N/A Primary Etiology: Cellulitis Cellulitis N/A Comorbid History: Cataracts, Arrhythmia, Cataracts, Arrhythmia, N/A Congestive Heart Failure, Congestive Heart Failure, Hypertension, Type II Hypertension, Type II Diabetes, Rheumatoid Arthritis Diabetes, Rheumatoid Arthritis Date Acquired: 03/24/2019 03/24/2019 N/A Weeks of Treatment: 2 2 N/A Wound Status: Open Open N/A Measurements L x W x D 0.9x0.7x0.1 3.1x2.9x0.1 N/A (cm) Area (cm) : 0.495 7.061 N/A Volume (cm) : 0.049 0.706 N/A % Reduction in Area: -80.00% -7.00% N/A % Reduction in Volume: -81.50% -  7.00% N/A Classification: Full Thickness Without Unclassifiable N/A Exposed Support Structures Exudate Amount: Medium Small N/A Exudate Type: Serosanguineous Serosanguineous N/A Exudate Color: red, brown red, brown N/A Wound Margin: Flat and Intact Thickened N/A Granulation Amount: None Present (0%) None Present (0%) N/A Necrotic Amount: Large (67-100%) Large (67-100%) N/A Necrotic Tissue: Eschar Eschar N/A Exposed Structures: Fascia: No Fascia: No N/A Fat Layer (Subcutaneous Fat Layer (Subcutaneous Tissue) Exposed: No Tissue) Exposed: No Tendon: No Tendon: No [name] [name] ([id number]) Muscle: No Muscle: No Joint: No Joint: No Bone: No Bone: No Epithelialization: None None N/A Treatment Notes Wound #1 (Left, Dorsal Foot) Notes Silvercell and bordered foam Wound #2 (Right, Dorsal Foot) Notes Silvercell and bordered foam Electronic Signature(s) Signed: 05/04/2019 5:48:31 PM By: Linton Ham MD Entered By: Linton Ham on 05/04/2019 10:13:30 [name] [name] ([id number]) -------------------------------------------------------------------------------- Multi-Disciplinary Care Plan Details Patient Name: [name] [name] Date of Service: 05/04/2019 9:15 AM Medical Record Number: [id number] Patient Account Number: [id number] Date of Birth/Sex: 1930/05/29 (86 y.o. F) Treating RN: Cornell Barman Primary Care Daina Cara: Emily Filbert  Other Clinician: Referring Enzio Buchler: Emily Filbert Treating Jemery Stacey/Extender: Tito Dine in Treatment: 2 Active Inactive Necrotic Tissue Nursing Diagnoses: Impaired tissue integrity related to necrotic/devitalized tissue Knowledge deficit related to management of necrotic/devitalized tissue Goals: Necrotic/devitalized tissue will be minimized in the wound bed Date Initiated: 04/20/2019 Target Resolution Date: 05/04/2019 Goal Status: Active Interventions: Assess patient pain level pre-, during and post procedure and prior to discharge Provide education on necrotic tissue and debridement process Treatment Activities: Apply topical anesthetic as ordered : 04/20/2019 Notes: Orientation to the Wound Care Program Nursing Diagnoses: Knowledge deficit related to the wound healing center program Goals: Patient/caregiver will verbalize understanding of the [place] Date Initiated: 04/20/2019 Target Resolution Date: 05/04/2019 Goal Status: Active Interventions: Provide education on orientation to the wound center Notes: Venous Leg Ulcer Nursing Diagnoses: Actual venous Insuffiency (use after diagnosis is confirmed) Goals: Patient/caregiver will verbalize understanding of disease process and disease management [name] [name] ([id number]) Date Initiated: 04/20/2019 Target Resolution Date: 05/04/2019 Goal Status: Active Interventions: Assess peripheral edema status every visit. Notes: Wound/Skin Impairment Nursing Diagnoses: Impaired tissue integrity Goals: Patient/caregiver will verbalize understanding of skin care regimen Date Initiated: 04/20/2019 Target Resolution Date: 05/04/2019 Goal Status: Active Ulcer/skin breakdown will have a volume reduction of 30% by week 4 Date Initiated: 04/20/2019 Target Resolution Date: 05/18/2019 Goal Status: Active Interventions: Assess ulceration(s) every visit Treatment Activities: Skin care regimen initiated :  04/20/2019 Topical wound management initiated : 04/20/2019 Notes: Electronic Signature(s) Signed: 05/18/2019 4:51:46 PM By: Gretta Cool, BSN, RN, CWS, Kim RN, BSN Entered By: Gretta Cool, BSN, RN, CWS, Kim on 05/04/2019 09:46:50 [name] [name] ([id number]) -------------------------------------------------------------------------------- Pain Assessment Details Patient Name: [name] [name] Date of Service: 05/04/2019 9:15 AM Medical Record Number: [id number] Patient Account Number: [id number] Date of Birth/Sex: January 10, 1931 (86 y.o. F) Treating RN: Army Melia Primary Care Zyia Kaneko: Emily Filbert Other Clinician: Referring Mylik Pro: Emily Filbert Treating Sami Roes/Extender: Tito Dine in Treatment: 2 Active Problems Location of Pain Severity and Description of Pain Patient Has Paino No Site Locations Pain Management and Medication Current Pain Management: Electronic Signature(s) Signed: 05/04/2019 11:52:26 AM By: Army Melia Entered By: Army Melia on 05/04/2019 09:24:07 [name] [name] ([id number]) -------------------------------------------------------------------------------- Patient/Caregiver Education Details Patient Name: [name] [name] Date of Service: 05/04/2019 9:15 AM Medical Record Number: [id number] Patient Account Number: [id number] Date of Birth/Gender: December 29, 1930 (86 y.o. F) Treating RN: Cornell Barman Primary Care Physician: Emily Filbert Other Clinician: Referring Physician: Emily Filbert Treating Physician/Extender: Linton Ham  G Weeks in Treatment: 2 Education Assessment Education Provided To: Patient Education Topics Provided Wound/Skin Impairment: Handouts: Caring for Your Ulcer, Other: Patient make appt with Vascular ASAP! Methods: Demonstration, Explain/Verbal Responses: State content correctly Electronic Signature(s) Signed: 05/18/2019 4:51:46 PM By: Gretta Cool, BSN, RN, CWS, Kim RN, BSN Entered By: Gretta Cool, BSN, RN, CWS, Kim on 05/04/2019 10:02:36 [name] [name], [place]  ([id number]) -------------------------------------------------------------------------------- Wound Assessment Details Patient Name: [name] [name] Date of Service: 05/04/2019 9:15 AM Medical Record Number: [id number] Patient Account Number: [id number] Date of Birth/Sex: [birth year] (86 y.o. F) Treating RN: Army Melia Primary Care Kenia Teagarden: Emily Filbert Other Clinician: Referring Royale Lennartz: Emily Filbert Treating Sara Selvidge/Extender: Tito Dine in Treatment: 2 Wound Status Wound Number: 1 Primary Cellulitis Etiology: Wound Location: Left Foot - Dorsal Wound Open Wounding Event: Trauma Status: Date Acquired: 03/24/2019 Comorbid Cataracts, Arrhythmia, Congestive Heart Weeks Of Treatment: 2 History: Failure, Hypertension, Type II Diabetes, Clustered Wound: No Rheumatoid Arthritis Photos Wound Measurements Length: (cm) 0.9 Width: (cm) 0.7 Depth: (cm) 0.1 Area: (cm) 0.495 Volume: (cm) 0.049 % Reduction in Area: -80% % Reduction in Volume: -81.5% Epithelialization: None Tunneling: No Undermining: No Wound Description Full Thickness Without Exposed Support Classification: Structures Wound Margin: Flat and Intact Exudate Medium Amount: Exudate Type: Serosanguineous Exudate Color: red, brown Foul Odor After Cleansing: No Slough/Fibrino Yes Wound Bed Granulation Amount: None Present (0%) Exposed Structure Necrotic Amount: Large (67-100%) Fascia Exposed: No Necrotic Quality: Eschar Fat Layer (Subcutaneous Tissue) Exposed: No Tendon Exposed: No Muscle Exposed: No Joint Exposed: No Bone Exposed: No [name] [name] ([id number]) Electronic Signature(s) Signed: 05/04/2019 11:52:26 AM By: Army Melia Entered By: Army Melia on 05/04/2019 09:28:12 [name] [name] ([id number]) -------------------------------------------------------------------------------- Wound Assessment Details Patient Name: [name] [name] Date of Service: 05/04/2019 9:15 AM Medical Record Number:  [id number] Patient Account Number: [id number] Date of Birth/Sex: 13-Jun-1930 (86 y.o. F) Treating RN: Army Melia Primary Care Friend Dorfman: Emily Filbert Other Clinician: Referring Auden Tatar: Emily Filbert Treating Blease Capaldi/Extender: Tito Dine in Treatment: 2 Wound Status Wound Number: 2 Primary Cellulitis Etiology: Wound Location: Right Foot - Dorsal Wound Open Wounding Event: Trauma Status: Date Acquired: 03/24/2019 Comorbid Cataracts, Arrhythmia, Congestive Heart Weeks Of Treatment: 2 History: Failure, Hypertension, Type II Diabetes, Clustered Wound: No Rheumatoid Arthritis Photos Wound Measurements Length: (cm) 3.1 % Reduction Width: (cm) 2.9 % Reduction Depth: (cm) 0.1 Epitheliali Area: (cm) 7.061 Volume: (cm) 0.706 in Area: -7% in Volume: -7% zation: None Wound Description Classification: Unclassifiable Foul Odor A Wound Margin: Thickened Slough/Fibr Exudate Amount: Small Exudate Type: Serosanguineous Exudate Color: red, brown fter Cleansing: No ino Yes Wound Bed Granulation Amount: None Present (0%) Exposed Structure Necrotic Amount: Large (67-100%) Fascia Exposed: No Necrotic Quality: Eschar Fat Layer (Subcutaneous Tissue) Exposed: No Tendon Exposed: No Muscle Exposed: No Joint Exposed: No Bone Exposed: No Electronic Signature(s) [name] [name] ([id number]) Signed: 05/04/2019 11:52:26 AM By: Army Melia Entered By: Army Melia on 05/04/2019 09:29:24 Vanessen, Antia ([id number]) -------------------------------------------------------------------------------- Vitals Details Patient Name: [name] [name] Date of Service: 05/04/2019 9:15 AM Medical Record Number: [id number] Patient Account Number: [id number] Date of Birth/Sex: 1930/07/06 (86 y.o. F) Treating RN: Army Melia Primary Care Iviana Blasingame: Emily Filbert Other Clinician: Referring Taurus Alamo: Emily Filbert Treating Janea Schwenn/Extender: Tito Dine in Treatment: 2 Vital Signs Time Taken:  09:24 Temperature (F): 98.5 Height (in): 69 Pulse (bpm): 56 Weight (lbs): 195 Respiratory Rate (breaths/min): 16 Body Mass Index (BMI): 28.8 Blood Pressure (mmHg): 180/77 Reference Range: 80 - 120 mg / dl Electronic Signature(s) Signed: 05/04/2019 11:52:26 AM By: Army Melia Entered By: Army Melia  on 05/04/2019 09:26:45

## 2019-05-18 NOTE — Progress Notes (Signed)
[place], [place] ([id number]) Visit Report for 05/04/2019 HPI Details Patient Name: [name], [name] [facility] Date of Service: 05/04/2019 9:15 AM Medical Record Number: [id number] Patient Account Number: [id number] Date of Birth/Sex: 1930/11/05 (86 y.o. F) Treating RN: Cornell Barman Primary Care Provider: Emily Filbert Other Clinician: Referring Provider: Emily Filbert Treating Provider/Extender: Tito Dine in Treatment: 2 History of Present Illness HPI Description: ADMISSION 04/20/2019 This is an 86 year old woman who lives in the independent part of [place]. She is a type II diabetic on oral agents and insulin with a recent hemoglobin A1c of 6.3 on 10/6. She was initially seen by primary care on 11/12 after dropping a heavy walker on her bilateral dorsal feet. She had blisters on the right and left lateral foot. She was given doxycycline. She was seen the next day in [place] clinic on 11/13 continued on the.C. Bilateral foot x-rays were negative. She was admitted to hospital from 11/17 through 11/27 with acute kidney injury I did not research this although the patient tells me that at discharge her kidney function had returned to normal. During the course of this hospitalization it was noted she had wounds on her bilateral feet and her peripheral pulses were nonpalpable. She was evaluated by Dr. Delana Meyer. It was recommended that she consider angiography on the right leg and it was unlikely that she would heal the right leg as her ABI here was 0.48 with monophasic waveforms on the left her ABI was 1.19 with biphasic waveforms. One would wonder if the ABI was artificially elevated on the left. In any case Dr. Cydney Ok note accurately describes the patient's current state of mind. She does not want to rush into an intervention and she is here for our review of her wounds. Noteworthy that when she arrived I think her renal insufficiency was felt to be prerenal. Her GFR on arrival was 22 now 36. Past  medical history includes type 2 diabetes with peripheral neuropathy, atrial fibrillation, renal insufficiency We did not calculate ABIs in our clinic this was 0.48 on the right and 1.19 on the left as noted above. Monophasic waveforms on the right biphasic on the left 12/16; patient admitted to the clinic last week with trauma critical limb ischemia on the right. Today her wound on the left dorsal foot is healed. She still has the black eschar on the dorsal right foot that we have been using Santyl on. There is apparently some drainage. She arrives in clinic today with more swelling on the right great and the left dorsal surrounding her foot wounds. Some erythema especially on the right. There is some tenderness on the right but not as much as I might expect if this was all infection nevertheless he does have some neuropathy which might be blunting discomfort. She reports no additional trauma. She is at the rehab section of [facility], she lives in the independent part of [place]. She tells me she has some pain in the right leg at night which wakes her up from sleeping although she manages to get back to sleep. She has about 150 foot exercise tolerance and then gets fatigue in her legs. Again I discussed the issue of angiogram with the patient especially on the right. She just will not agree to this. She states she wants the rehab and then go to [facility] where a list of her specialists. She was on Levaquin last week we did not prescribe this but she is finished. She describes some nausea and loose stools but does  not sound dramatically unwell with this Left dorsal foot had some very superficial areas today I think related to swelling. The big problem is on the dorsal right foot black surface eschar with increased swelling. She does not have any pain or tenderness although she has neuropathy. We have been using Santyl. She has PAD and again I think needs an angiogram. Electronic Signature(s) Signed:  05/04/2019 5:48:31 PM By: Linton Ham MD Entered By: Linton Ham on 05/04/2019 10:14:28 [place], [place] ([id number]) [place], [place] ([id number]) -------------------------------------------------------------------------------- Physical Exam Details Patient Name: [name] Date of Service: 05/04/2019 9:15 AM Medical Record Number: [id number] Patient Account Number: [id number] Date of Birth/Sex: May 28, 1930 (86 y.o. F) Treating RN: Cornell Barman Primary Care Provider: Emily Filbert Other Clinician: Referring Provider: Emily Filbert Treating Provider/Extender: Tito Dine in Treatment: 2 Constitutional Patient is hypertensive.. Pulse regular and within target range for patient.Marland Kitchen Respirations regular, non-labored and within target range.. Temperature is normal and within the target range for the patient.Marland Kitchen appears in no distress. Respiratory Respiratory effort is easy and symmetric bilaterally. Rate is normal at rest and on room air.. Cardiovascular Dorsalis pedis and posterior tibial are not palpable. I cannot feel her popliteal pulse. Faint femoral pulse. Increasing nonpitting edema in the right ankle area.. Integumentary (Hair, Skin) Erythema around either wound. Psychiatric No evidence of depression, anxiety, or agitation. Calm, cooperative, and communicative. Appropriate interactions and affect.. Notes Wound exam; oOn the right side she still has black ischemic-looking eschar on top of the wound. There is increasing amounts of fluid underneath this. There is no palpable tenderness and no warmth. This could be of course infectious although I wonder if this is just a residual hematoma or indeed more tissue necrosis from ischemia. oOn the left side dorsal foot very benign looking surface debris although it is reopened from last week. Electronic Signature(s) Signed: 05/04/2019 5:48:31 PM By: Linton Ham MD Entered By: Linton Ham on 05/04/2019 10:17:31 [place], Shira  ([id number]) -------------------------------------------------------------------------------- Physician Orders Details Patient Name: [name] Date of Service: 05/04/2019 9:15 AM Medical Record Number: [id number] Patient Account Number: [id number] Date of Birth/Sex: 1930-06-06 (86 y.o. F) Treating RN: Cornell Barman Primary Care Provider: Emily Filbert Other Clinician: Referring Provider: Emily Filbert Treating Provider/Extender: Tito Dine in Treatment: 2 Verbal / Phone Orders: No Diagnosis Coding Wound Cleansing Wound #1 Left,Dorsal Foot o Clean wound with Normal Saline. Wound #2 Right,Dorsal Foot o Clean wound with Normal Saline. Anesthetic (add to Medication List) Wound #1 Left,Dorsal Foot o Topical Lidocaine 4% cream applied to wound bed prior to debridement (In Clinic Only). Wound #2 Right,Dorsal Foot o Topical Lidocaine 4% cream applied to wound bed prior to debridement (In Clinic Only). Primary Wound Dressing Wound #1 Left,Dorsal Foot o Silver Alginate Wound #2 Right,Dorsal Foot o Silver Alginate Secondary Dressing Wound #1 Left,Dorsal Foot o Boardered Foam Dressing Wound #2 Right,Dorsal Foot o Boardered Foam Dressing Dressing Change Frequency Wound #1 Left,Dorsal Foot o Change Dressing Tuesday and Thursday. Wound #2 Right,Dorsal Foot o Change Dressing Tuesday and Thursday. Follow-up Appointments Wound #1 Left,Dorsal Foot o Return Appointment in 1 week. Wound #2 Right,Dorsal Foot o Return Appointment in 1 week. Edema Control [place], [place] ([id number]) Wound #1 Left,Dorsal Foot o Elevate legs to the level of the heart and pump ankles as often as possible Wound #2 Right,Dorsal Foot o Elevate legs to the level of the heart and pump ankles as often as possible Consults o Vascular - Consult to move forward with vascular testing/procedure. Electronic Signature(s) Signed: 05/04/2019  5:48:31 PM By: Linton Ham MD Signed:  05/18/2019 4:51:46 PM By: Gretta Cool BSN, RN, CWS, Kim RN, BSN Entered By: Gretta Cool, BSN, RN, CWS, Kim on 05/04/2019 10:05:41 [place], [place] ([id number]) -------------------------------------------------------------------------------- Problem List Details Patient Name: [name] Date of Service: 05/04/2019 9:15 AM Medical Record Number: [id number] Patient Account Number: [id number] Date of Birth/Sex: 07/01/1930 (86 y.o. F) Treating RN: Cornell Barman Primary Care Provider: Emily Filbert Other Clinician: Referring Provider: Emily Filbert Treating Provider/Extender: Tito Dine in Treatment: 2 Active Problems ICD-10 Evaluated Encounter Code Description Active Date Today Diagnosis E11.52 Type 2 diabetes mellitus with diabetic peripheral angiopathy 04/20/2019 No Yes with gangrene E11.42 Type 2 diabetes mellitus with diabetic polyneuropathy 04/20/2019 No Yes L97.528 Non-pressure chronic ulcer of other part of left foot with other 04/20/2019 No Yes specified severity E11.621 Type 2 diabetes mellitus with foot ulcer 04/20/2019 No Yes L97.518 Non-pressure chronic ulcer of other part of right foot with 04/20/2019 No Yes other specified severity Inactive Problems Resolved Problems Electronic Signature(s) Signed: 05/04/2019 5:48:31 PM By: Linton Ham MD Entered By: Linton Ham on 05/04/2019 10:13:22 [place], [place] ([id number]) -------------------------------------------------------------------------------- Progress Note Details Patient Name: [name] Date of Service: 05/04/2019 9:15 AM Medical Record Number: [id number] Patient Account Number: [id number] Date of Birth/Sex: 1931-04-11 (86 y.o. F) Treating RN: Cornell Barman Primary Care Provider: Emily Filbert Other Clinician: Referring Provider: Emily Filbert Treating Provider/Extender: Tito Dine in Treatment: 2 Subjective History of Present Illness (HPI) ADMISSION 04/20/2019 This is an 86 year old woman who lives in the  independent part of [place]. She is a type II diabetic on oral agents and insulin with a recent hemoglobin A1c of 6.3 on 10/6. She was initially seen by primary care on 11/12 after dropping a heavy walker on her bilateral dorsal feet. She had blisters on the right and left lateral foot. She was given doxycycline. She was seen the next day in [place] [facility] on 11/13 continued on the.C. Bilateral foot x-rays were negative. She was admitted to hospital from 11/17 through 11/27 with acute kidney injury I did not research this although the patient tells me that at discharge her kidney function had returned to normal. During the course of this hospitalization it was noted she had wounds on her bilateral feet and her peripheral pulses were nonpalpable. She was evaluated by Dr. Delana Meyer. It was recommended that she consider angiography on the right leg and it was unlikely that she would heal the right leg as her ABI here was 0.48 with monophasic waveforms on the left her ABI was 1.19 with biphasic waveforms. One would wonder if the ABI was artificially elevated on the left. In any case Dr. Cydney Ok note accurately describes the patient's current state of mind. She does not want to rush into an intervention and she is here for our review of her wounds. Noteworthy that when she arrived I think her renal insufficiency was felt to be prerenal. Her GFR on arrival was 22 now 36. Past medical history includes type 2 diabetes with peripheral neuropathy, atrial fibrillation, renal insufficiency We did not calculate ABIs in our clinic this was 0.48 on the right and 1.19 on the left as noted above. Monophasic waveforms on the right biphasic on the left 12/16; patient admitted to the clinic last week with trauma critical limb ischemia on the right. Today her wound on the left dorsal foot is healed. She still has the black eschar on the dorsal right foot that we have been using Santyl on. There is apparently  some  drainage. She arrives in clinic today with more swelling on the right great and the left dorsal surrounding her foot wounds. Some erythema especially on the right. There is some tenderness on the right but not as much as I might expect if this was all infection nevertheless he does have some neuropathy which might be blunting discomfort. She reports no additional trauma. She is at the rehab section of [facility], she lives in the independent part of [place]. She tells me she has some pain in the right leg at night which wakes her up from sleeping although she manages to get back to sleep. She has about 150 foot exercise tolerance and then gets fatigue in her legs. Again I discussed the issue of angiogram with the patient especially on the right. She just will not agree to this. She states she wants the rehab and then go to [facility] where a list of her specialists. She was on Levaquin last week we did not prescribe this but she is finished. She describes some nausea and loose stools but does not sound dramatically unwell with this Left dorsal foot had some very superficial areas today I think related to swelling. The big problem is on the dorsal right foot black surface eschar with increased swelling. She does not have any pain or tenderness although she has neuropathy. We have been using Santyl. She has PAD and again I think needs an angiogram. [place], [place] ([id number]) Objective Constitutional Patient is hypertensive.. Pulse regular and within target range for patient.Marland Kitchen Respirations regular, non-labored and within target range.. Temperature is normal and within the target range for the patient.Marland Kitchen appears in no distress. Vitals Time Taken: 9:24 AM, Height: 69 in, Weight: 195 lbs, BMI: 28.8, Temperature: 98.5 F, Pulse: 56 bpm, Respiratory Rate: 16 breaths/min, Blood Pressure: 180/77 mmHg. Respiratory Respiratory effort is easy and symmetric bilaterally. Rate is normal at rest and on room  air.. Cardiovascular Dorsalis pedis and posterior tibial are not palpable. I cannot feel her popliteal pulse. Faint femoral pulse. Increasing nonpitting edema in the right ankle area.Marland Kitchen Psychiatric No evidence of depression, anxiety, or agitation. Calm, cooperative, and communicative. Appropriate interactions and affect.. General Notes: Wound exam; On the right side she still has black ischemic-looking eschar on top of the wound. There is increasing amounts of fluid underneath this. There is no palpable tenderness and no warmth. This could be of course infectious although I wonder if this is just a residual hematoma or indeed more tissue necrosis from ischemia. On the left side dorsal foot very benign looking surface debris although it is reopened from last week. Integumentary (Hair, Skin) Erythema around either wound. Wound #1 status is Open. Original cause of wound was Trauma. The wound is located on the Left,Dorsal Foot. The wound measures 0.9cm length x 0.7cm width x 0.1cm depth; 0.495cm^2 area and 0.049cm^3 volume. There is no tunneling or undermining noted. There is a medium amount of serosanguineous drainage noted. The wound margin is flat and intact. There is no granulation within the wound bed. There is a large (67-100%) amount of necrotic tissue within the wound bed including Eschar. Wound #2 status is Open. Original cause of wound was Trauma. The wound is located on the Right,Dorsal Foot. The wound measures 3.1cm length x 2.9cm width x 0.1cm depth; 7.061cm^2 area and 0.706cm^3 volume. There is a small amount of serosanguineous drainage noted. The wound margin is thickened. There is no granulation within the wound bed. There is a large (67-100%) amount  of necrotic tissue within the wound bed including Eschar. Assessment Active Problems ICD-10 Type 2 diabetes mellitus with diabetic peripheral angiopathy with gangrene Type 2 diabetes mellitus with diabetic polyneuropathy Non-pressure  chronic ulcer of other part of left foot with other specified severity Type 2 diabetes mellitus with foot ulcer Non-pressure chronic ulcer of other part of right foot with other specified severity [place], Leslye ([id number]) Plan Wound Cleansing: Wound #1 Left,Dorsal Foot: Clean wound with Normal Saline. Wound #2 Right,Dorsal Foot: Clean wound with Normal Saline. Anesthetic (add to Medication List): Wound #1 Left,Dorsal Foot: Topical Lidocaine 4% cream applied to wound bed prior to debridement (In Clinic Only). Wound #2 Right,Dorsal Foot: Topical Lidocaine 4% cream applied to wound bed prior to debridement (In Clinic Only). Primary Wound Dressing: Wound #1 Left,Dorsal Foot: Silver Alginate Wound #2 Right,Dorsal Foot: Silver Alginate Secondary Dressing: Wound #1 Left,Dorsal Foot: Boardered Foam Dressing Wound #2 Right,Dorsal Foot: Boardered Foam Dressing Dressing Change Frequency: Wound #1 Left,Dorsal Foot: Change Dressing Tuesday and Thursday. Wound #2 Right,Dorsal Foot: Change Dressing Tuesday and Thursday. Follow-up Appointments: Wound #1 Left,Dorsal Foot: Return Appointment in 1 week. Wound #2 Right,Dorsal Foot: Return Appointment in 1 week. Edema Control: Wound #1 Left,Dorsal Foot: Elevate legs to the level of the heart and pump ankles as often as possible Wound #2 Right,Dorsal Foot: Elevate legs to the level of the heart and pump ankles as often as possible Consults ordered were: Vascular - Consult to move forward with vascular testing/procedure. 1. I change the dressings on both sides silver alginate. I do not think the Santyl is doing anything on her right foot. 2. Probably fits the definition now of critical limb ischemia on the right 3. I have done my best to convey my concern about this in the most direct but gentle way I can. She is concerned about her kidneys and rightly so although I have told her other options about this and I do not think it should preclude [place]  from getting into an office they can help her. She is always wants to go to [facility] but I cannot help her with this Electronic Signature(s) Signed: 05/04/2019 5:48:31 PM By: Linton Ham MD Entered By: Linton Ham on 05/04/2019 10:19:38 [place], [place]ina ([id number]) [place], [place] ([id number]) -------------------------------------------------------------------------------- SuperBill Details Patient Name: [name] Date of Service: 05/04/2019 Medical Record Number: [id number] Patient Account Number: [id number] Date of Birth/Sex: April 17, 1931 (86 y.o. F) Treating RN: Cornell Barman Primary Care Provider: Emily Filbert Other Clinician: Referring Provider: Emily Filbert Treating Provider/Extender: Tito Dine in Treatment: 2 Diagnosis Coding ICD-10 Codes Code Description E11.52 Type 2 diabetes mellitus with diabetic peripheral angiopathy with gangrene E11.42 Type 2 diabetes mellitus with diabetic polyneuropathy L97.528 Non-pressure chronic ulcer of other part of left foot with other specified severity E11.621 Type 2 diabetes mellitus with foot ulcer L97.518 Non-pressure chronic ulcer of other part of right foot with other specified severity Facility Procedures CPT4 Code: [id number] Description: 99213 - WOUND CARE VISIT-LEV 3 EST PT Modifier: Quantity: 1 Physician Procedures CPT4 Code Description: [id number] - WC PHYS LEVEL 3 - EST PT ICD-10 Diagnosis Description E11.52 Type 2 diabetes mellitus with diabetic peripheral angiopathy wit L97.518 Non-pressure chronic ulcer of other part of right foot with othe E11.621 Type 2  diabetes mellitus with foot ulcer L97.528 Non-pressure chronic ulcer of other part of left foot with other Modifier: h gangrene r specified seve specified sever Quantity: 1 rity Passenger transport manager) Signed: 05/04/2019 5:48:31 PM By: Linton Ham MD Entered By: Linton Ham  on 05/04/2019 10:20:05

## 2019-06-01 ENCOUNTER — Ambulatory Visit: Payer: PRIVATE HEALTH INSURANCE | Admitting: Internal Medicine

## 2019-06-03 DIAGNOSIS — I5030 Unspecified diastolic (congestive) heart failure: Secondary | ICD-10-CM | POA: Diagnosis not present

## 2019-06-03 DIAGNOSIS — G9009 Other idiopathic peripheral autonomic neuropathy: Secondary | ICD-10-CM

## 2019-06-03 DIAGNOSIS — I10 Essential (primary) hypertension: Secondary | ICD-10-CM | POA: Diagnosis not present

## 2019-06-03 DIAGNOSIS — N183 Chronic kidney disease, stage 3 unspecified: Secondary | ICD-10-CM | POA: Diagnosis not present

## 2019-06-03 DIAGNOSIS — E1121 Type 2 diabetes mellitus with diabetic nephropathy: Secondary | ICD-10-CM

## 2019-06-03 DIAGNOSIS — E11621 Type 2 diabetes mellitus with foot ulcer: Secondary | ICD-10-CM | POA: Diagnosis not present

## 2019-06-08 ENCOUNTER — Encounter: Payer: Medicare Other | Attending: Internal Medicine | Admitting: Internal Medicine

## 2019-06-08 ENCOUNTER — Other Ambulatory Visit: Payer: Self-pay

## 2019-06-08 DIAGNOSIS — E1152 Type 2 diabetes mellitus with diabetic peripheral angiopathy with gangrene: Secondary | ICD-10-CM | POA: Diagnosis not present

## 2019-06-08 DIAGNOSIS — E11621 Type 2 diabetes mellitus with foot ulcer: Secondary | ICD-10-CM | POA: Insufficient documentation

## 2019-06-08 DIAGNOSIS — E1142 Type 2 diabetes mellitus with diabetic polyneuropathy: Secondary | ICD-10-CM | POA: Insufficient documentation

## 2019-06-08 DIAGNOSIS — L97518 Non-pressure chronic ulcer of other part of right foot with other specified severity: Secondary | ICD-10-CM | POA: Insufficient documentation

## 2019-06-08 DIAGNOSIS — Z8616 Personal history of COVID-19: Secondary | ICD-10-CM | POA: Insufficient documentation

## 2019-06-22 ENCOUNTER — Encounter: Payer: Medicare Other | Attending: Internal Medicine | Admitting: Internal Medicine

## 2019-06-22 ENCOUNTER — Other Ambulatory Visit: Payer: Self-pay

## 2019-06-22 DIAGNOSIS — L97528 Non-pressure chronic ulcer of other part of left foot with other specified severity: Secondary | ICD-10-CM | POA: Insufficient documentation

## 2019-06-22 DIAGNOSIS — I509 Heart failure, unspecified: Secondary | ICD-10-CM | POA: Diagnosis not present

## 2019-06-22 DIAGNOSIS — I110 Hypertensive heart disease with heart failure: Secondary | ICD-10-CM | POA: Diagnosis not present

## 2019-06-22 DIAGNOSIS — Z794 Long term (current) use of insulin: Secondary | ICD-10-CM | POA: Diagnosis not present

## 2019-06-22 DIAGNOSIS — E1152 Type 2 diabetes mellitus with diabetic peripheral angiopathy with gangrene: Secondary | ICD-10-CM | POA: Insufficient documentation

## 2019-06-22 DIAGNOSIS — L97518 Non-pressure chronic ulcer of other part of right foot with other specified severity: Secondary | ICD-10-CM | POA: Diagnosis not present

## 2019-06-22 DIAGNOSIS — E1142 Type 2 diabetes mellitus with diabetic polyneuropathy: Secondary | ICD-10-CM | POA: Insufficient documentation

## 2019-06-22 DIAGNOSIS — E11621 Type 2 diabetes mellitus with foot ulcer: Secondary | ICD-10-CM | POA: Insufficient documentation

## 2019-06-23 NOTE — Progress Notes (Signed)
[place], [place] ([id number]) Visit Report for 06/22/2019 Arrival Information Details Patient Name: [name] [facility] Date of Service: 06/22/2019 10:30 AM Medical Record Number: [id number] Patient Account Number: [id number] Date of Birth/Sex: 07-02-1930 (86 y.o. F) Treating RN: Montey Hora Primary Care Wilkin Lippy: Emily Filbert Other Clinician: Referring Joslyne Marshburn: Emily Filbert Treating Jailine Lieder/Extender: Tito Dine in Treatment: 9 Visit Information History Since Last Visit Added or deleted any medications: No Patient Arrived: [place] Any new allergies or adverse reactions: No Arrival Time: 10:25 Had a fall or experienced change in No Accompanied By: self activities of daily living that may affect Transfer Assistance: None risk of falls: Patient Identification Verified: Yes Signs or symptoms of abuse/neglect since last visito No Secondary Verification Process Yes Hospitalized since last visit: No Completed: Implantable device outside of the clinic excluding No Patient Has Alerts: Yes cellular tissue based products placed in the center Patient Alerts: Patient on Blood since last visit: Thinner Has Dressing in Place as Prescribed: Yes ABI L 1.19 R 0.48 Pain Present Now: No 03/2019 2-325mg  Aspirin daily Electronic Signature(s) Signed: 06/22/2019 4:52:40 PM By: Montey Hora Entered By: Montey Hora on 06/22/2019 10:26:13 [name] ([id number]) -------------------------------------------------------------------------------- Encounter Discharge Information Details Patient Name: [name] Date of Service: 06/22/2019 10:30 AM Medical Record Number: [id number] Patient Account Number: [id number] Date of Birth/Sex: [birth year] (86 y.o. F) Treating RN: Cornell Barman Primary Care Shia Eber: Emily Filbert Other Clinician: Referring Corinthia Helmers: Emily Filbert Treating [name]/Extender: Tito Dine in Treatment: 9 Encounter Discharge Information Items Post Procedure  Vitals Discharge Condition: Stable Temperature (F): 98.7 Ambulatory Status: Ambulatory Pulse (bpm): 63 Discharge Destination: Home Respiratory Rate (breaths/min): 16 Transportation: Private Auto Blood Pressure (mmHg): 179/68 Accompanied By: self Schedule Follow-up Appointment: Yes Clinical Summary of Care: Electronic Signature(s) Signed: 06/22/2019 5:56:57 PM By: Gretta Cool, BSN, RN, CWS, Kim RN, BSN Entered By: Gretta Cool, BSN, RN, CWS, Kim on 06/22/2019 11:02:26 [name] ([id number]) -------------------------------------------------------------------------------- Lower Extremity Assessment Details Patient Name: [name] Date of Service: 06/22/2019 10:30 AM Medical Record Number: [id number] Patient Account Number: [id number] Date of Birth/Sex: 01-16-1931 (86 y.o. F) Treating RN: Montey Hora Primary Care Jakub Debold: Emily Filbert Other Clinician: Referring Robin Petrakis: Emily Filbert Treating [name]/Extender: Ricard Dillon Weeks in Treatment: 9 Edema Assessment Assessed: [Left: No] [Right: No] Edema: [Left: Ye] [Right: s] Vascular Assessment Pulses: Dorsalis Pedis Palpable: [Right:Yes] Electronic Signature(s) Signed: 06/22/2019 4:52:40 PM By: Montey Hora Entered By: Montey Hora on 06/22/2019 10:28:43 [name] ([id number]) -------------------------------------------------------------------------------- Multi Wound Chart Details Patient Name: [name] Date of Service: 06/22/2019 10:30 AM Medical Record Number: [id number] Patient Account Number: [id number] Date of Birth/Sex: 1930-08-14 (86 y.o. F) Treating RN: Cornell Barman Primary Care Liya Strollo: Emily Filbert Other Clinician: Referring Chayce Rullo: Emily Filbert Treating [name]/Extender: Tito Dine in Treatment: 9 Vital Signs Height(in): 69 Pulse(bpm): 63 Weight(lbs): 195 Blood Pressure(mmHg): 179/68 Body Mass Index(BMI): 29 Temperature(F): 98.7 Respiratory Rate 16 (breaths/min): Photos:  [N/A:N/A] Wound Location: Right Foot - Dorsal N/A N/A Wounding Event: Trauma N/A N/A Primary Etiology: Cellulitis N/A N/A Comorbid History: Cataracts, Arrhythmia, N/A N/A Congestive Heart Failure, Hypertension, Type II Diabetes, Rheumatoid Arthritis Date Acquired: 03/24/2019 N/A N/A Weeks of Treatment: 9 N/A N/A Wound Status: Open N/A N/A Measurements L x W x D 3.9x3.6x0.3 N/A N/A (cm) Area (cm) : 11.027 N/A N/A Volume (cm) : 3.308 N/A N/A % Reduction in Area: -67.20% N/A N/A % Reduction in Volume: -401.20% N/A N/A Classification: Full Thickness Without N/A N/A Exposed Support Structures Exudate Amount: Medium N/A N/A Exudate Type: Serosanguineous N/A N/A Exudate  Color: red, brown N/A N/A Wound Margin: Thickened N/A N/A Granulation Amount: Small (1-33%) N/A N/A Granulation Quality: Red N/A N/A Necrotic Amount: Large (67-100%) N/A N/A Exposed Structures: Fat Layer (Subcutaneous N/A N/A Tissue) Exposed: Yes Fascia: No Tendon: No [name] ([id number]) Muscle: No Joint: No Bone: No Epithelialization: None N/A N/A Debridement: Debridement - Excisional N/A N/A Pre-procedure 10:48 N/A N/A Verification/Time Out Taken: Pain Control: Lidocaine N/A N/A Tissue Debrided: Subcutaneous, Slough N/A N/A Level: Skin/Subcutaneous Tissue N/A N/A Debridement Area (sq cm): 13.65 N/A N/A Instrument: Curette N/A N/A Bleeding: Moderate N/A N/A Hemostasis Achieved: Pressure N/A N/A Debridement Treatment Procedure was tolerated well N/A N/A Response: Post Debridement 3.9x3.6x0.3 N/A N/A Measurements L x W x D (cm) Post Debridement Volume: 3.308 N/A N/A (cm) Procedures Performed: Debridement N/A N/A Treatment Notes Wound #2 (Right, Dorsal Foot) Notes Santyl, Bordered Foam dressing to secure Electronic Signature(s) Signed: 06/22/2019 5:43:36 PM By: Linton Ham MD Entered By: Linton Ham on 06/22/2019 12:26:40 [name], [name]  ([id number]) -------------------------------------------------------------------------------- Tangerine Details Patient Name: [name] Date of Service: 06/22/2019 10:30 AM Medical Record Number: [id number] Patient Account Number: [id number] Date of Birth/Sex: 1930-07-20 (86 y.o. F) Treating RN: Cornell Barman Primary Care Keyia Moretto: Emily Filbert Other Clinician: Referring Georgena Weisheit: Emily Filbert Treating Zian Delair/Extender: Tito Dine in Treatment: 9 Active Inactive Necrotic Tissue Nursing Diagnoses: Impaired tissue integrity related to necrotic/devitalized tissue Knowledge deficit related to management of necrotic/devitalized tissue Goals: Necrotic/devitalized tissue will be minimized in the wound bed Date Initiated: 04/20/2019 Target Resolution Date: 05/04/2019 Goal Status: Active Interventions: Assess patient pain level pre-, during and post procedure and prior to discharge Provide education on necrotic tissue and debridement process Treatment Activities: Apply topical anesthetic as ordered : 04/20/2019 Notes: Orientation to the Wound Care Program Nursing Diagnoses: Knowledge deficit related to the wound healing center program Goals: Patient/caregiver will verbalize understanding of the [place] Date Initiated: 04/20/2019 Target Resolution Date: 05/04/2019 Goal Status: Active Interventions: Provide education on orientation to the wound center Notes: Venous Leg Ulcer Nursing Diagnoses: Actual venous Insuffiency (use after diagnosis is confirmed) Goals: Patient/caregiver will verbalize understanding of disease process and disease management RUMBERGER, Janan ([id number]) Date Initiated: 04/20/2019 Target Resolution Date: 05/04/2019 Goal Status: Active Interventions: Assess peripheral edema status every visit. Notes: Wound/Skin Impairment Nursing Diagnoses: Impaired tissue integrity Goals: Patient/caregiver will verbalize  understanding of skin care regimen Date Initiated: 04/20/2019 Target Resolution Date: 05/04/2019 Goal Status: Active Ulcer/skin breakdown will have a volume reduction of 30% by week 4 Date Initiated: 04/20/2019 Target Resolution Date: 05/18/2019 Goal Status: Active Interventions: Assess ulceration(s) every visit Treatment Activities: Skin care regimen initiated : 04/20/2019 Topical wound management initiated : 04/20/2019 Notes: Electronic Signature(s) Signed: 06/22/2019 5:56:57 PM By: Gretta Cool, BSN, RN, CWS, Kim RN, BSN Entered By: Gretta Cool, BSN, RN, CWS, Kim on 06/22/2019 10:59:34 [name], Elizabella ([id number]) -------------------------------------------------------------------------------- Pain Assessment Details Patient Name: [name] Date of Service: 06/22/2019 10:30 AM Medical Record Number: [id number] Patient Account Number: [id number] Date of Birth/Sex: July 23, 1930 (86 y.o. F) Treating RN: Montey Hora Primary Care Akil Hoos: Emily Filbert Other Clinician: Referring Rikayla Demmon: Emily Filbert Treating Zareah Hunzeker/Extender: Tito Dine in Treatment: 9 Active Problems Location of Pain Severity and Description of Pain Patient Has Paino No Site Locations Pain Management and Medication Current Pain Management: Electronic Signature(s) Signed: 06/22/2019 4:52:40 PM By: Montey Hora Entered By: Montey Hora on 06/22/2019 10:26:19 Marucci, Annissa ([id number]) -------------------------------------------------------------------------------- Patient/Caregiver Education Details Patient Name: [name] Date of Service: 06/22/2019 10:30 AM Medical Record Number: [id number]  Patient Account Number: [id number] Date of Birth/Gender: 01-18-1931 (86 y.o. F) Treating RN: Cornell Barman Primary Care Physician: Emily Filbert Other Clinician: Referring Physician: Emily Filbert Treating Physician/Extender: Tito Dine in Treatment: 9 Education Assessment Education Provided  To: Patient Education Topics Provided Wound/Skin Impairment: Handouts: Caring for Your Ulcer, Other: wound care as prescribed Methods: Demonstration, Explain/Verbal Responses: State content correctly Electronic Signature(s) Signed: 06/22/2019 5:56:57 PM By: Gretta Cool, BSN, RN, CWS, Kim RN, BSN Entered By: Gretta Cool, BSN, RN, CWS, Kim on 06/22/2019 11:01:08 [name], Jarelis ([id number]) -------------------------------------------------------------------------------- Wound Assessment Details Patient Name: [name] Date of Service: 06/22/2019 10:30 AM Medical Record Number: [id number] Patient Account Number: [id number] Date of Birth/Sex: [birth year] (86 y.o. F) Treating RN: Cornell Barman Primary Care Vernesha Talbot: Emily Filbert Other Clinician: Referring Gresham Caetano: Emily Filbert Treating Rhylan Kagel/Extender: Tito Dine in Treatment: 9 Wound Status Wound Number: 2 Primary Cellulitis Etiology: Wound Location: Right Foot - Dorsal Wound Open Wounding Event: Trauma Status: Date Acquired: 03/24/2019 Comorbid Cataracts, Arrhythmia, Congestive Heart Weeks Of Treatment: 9 History: Failure, Hypertension, Type II Diabetes, Clustered Wound: No Rheumatoid Arthritis Photos Wound Measurements Length: (cm) 3.9 Width: (cm) 3.6 Depth: (cm) 0.3 Area: (cm) 11.027 Volume: (cm) 3.308 % Reduction in Area: -67.2% % Reduction in Volume: -401.2% Epithelialization: None Tunneling: No Undermining: No Wound Description Full Thickness Without Exposed Support Classification: Structures Wound Margin: Thickened Exudate Medium Amount: Exudate Type: Serosanguineous Exudate Color: red, brown Foul Odor After Cleansing: No Slough/Fibrino Yes Wound Bed Granulation Amount: Small (1-33%) Exposed Structure Granulation Quality: Red Fascia Exposed: No Necrotic Amount: Large (67-100%) Fat Layer (Subcutaneous Tissue) Exposed: Yes Necrotic Quality: Adherent Slough Tendon Exposed: No Muscle Exposed: No Joint  Exposed: No Bone Exposed: No Zalesky, Korbin ([id number]) Treatment Notes Wound #2 (Right, Dorsal Foot) Notes Santyl, Bordered Foam dressing to secure Electronic Signature(s) Signed: 06/22/2019 5:56:57 PM By: Gretta Cool, BSN, RN, CWS, Kim RN, BSN Entered By: Gretta Cool, BSN, RN, CWS, Kim on 06/22/2019 10:59:22 Salm, Kortne ([id number]) -------------------------------------------------------------------------------- Vitals Details Patient Name: [name] Date of Service: 06/22/2019 10:30 AM Medical Record Number: [id number] Patient Account Number: [id number] Date of Birth/Sex: 09-Aug-1930 (86 y.o. F) Treating RN: Montey Hora Primary Care Treyvon Blahut: Emily Filbert Other Clinician: Referring Ferne Ellingwood: Emily Filbert Treating Sharla Tankard/Extender: Tito Dine in Treatment: 9 Vital Signs Time Taken: 10:29 Temperature (F): 98.7 Height (in): 69 Pulse (bpm): 63 Weight (lbs): 195 Respiratory Rate (breaths/min): 16 Body Mass Index (BMI): 28.8 Blood Pressure (mmHg): 179/68 Reference Range: 80 - 120 mg / dl Electronic Signature(s) Signed: 06/22/2019 4:52:40 PM By: Montey Hora Entered By: Montey Hora on 06/22/2019 10:29:58

## 2019-06-23 NOTE — Progress Notes (Signed)
[place], Megahn ([id number]) Visit Report for 06/22/2019 Debridement Details Patient Name: [name], [name] [facility] Date of Service: 06/22/2019 10:30 AM Medical Record Number: [id number] Patient Account Number: [id number] Date of Birth/Sex: 10-Jun-1930 (86 y.o. F) Treating RN: Cornell Barman Primary Care Provider: Emily Filbert Other Clinician: Referring Provider: Emily Filbert Treating Provider/Extender: Tito Dine in Treatment: 9 Debridement Performed for Wound #2 Right,Dorsal Foot Assessment: Performed By: Physician Ricard Dillon, MD Debridement Type: Debridement Level of Consciousness (Pre- Awake and Alert procedure): Pre-procedure Verification/Time Yes - 10:48 Out Taken: Start Time: 10:48 Pain Control: Lidocaine Total Area Debrided (L x W): 3.9 (cm) x 3.5 (cm) = 13.65 (cm) Tissue and other material Viable, Non-Viable, Slough, Subcutaneous, Slough debrided: Level: Skin/Subcutaneous Tissue Debridement Description: Excisional Instrument: Curette Bleeding: Moderate Hemostasis Achieved: Pressure End Time: 10:51 Response to Treatment: Procedure was tolerated well Level of Consciousness Awake and Alert (Post-procedure): Post Debridement Measurements of Total Wound Length: (cm) 3.9 Width: (cm) 3.6 Depth: (cm) 0.3 Volume: (cm) 3.308 Character of Wound/Ulcer Post Debridement: Stable Post Procedure Diagnosis Same as Pre-procedure Electronic Signature(s) Signed: 06/22/2019 5:43:36 PM By: Linton Ham MD Signed: 06/22/2019 5:56:57 PM By: Gretta Cool, BSN, RN, CWS, Kim RN, BSN Entered By: Linton Ham on 06/22/2019 12:26:52 [name], [name] ([id number]) -------------------------------------------------------------------------------- HPI Details Patient Name: [name] Date of Service: 06/22/2019 10:30 AM Medical Record Number: [id number] Patient Account Number: [id number] Date of Birth/Sex: June 21, 1930 (86 y.o. F) Treating RN: Cornell Barman Primary Care Provider: Emily Filbert Other  Clinician: Referring Provider: Emily Filbert Treating Provider/Extender: Tito Dine in Treatment: 9 History of Present Illness HPI Description: ADMISSION 04/20/2019 This is an 86 year old woman who lives in the independent part of [place]. She is a type II diabetic on oral agents and insulin with a recent hemoglobin A1c of 6.3 on 10/6. She was initially seen by primary care on 11/12 after dropping a heavy walker on her bilateral dorsal feet. She had blisters on the right and left lateral foot. She was given doxycycline. She was seen the next day in [facility] on 11/13 continued on the.C. Bilateral foot x-rays were negative. She was admitted to hospital from 11/17 through 11/27 with acute kidney injury I did not research this although the patient tells me that at discharge her kidney function had returned to normal. During the course of this hospitalization it was noted she had wounds on her bilateral feet and her peripheral pulses were nonpalpable. She was evaluated by Dr. Delana Meyer. It was recommended that she consider angiography on the right leg and it was unlikely that she would heal the right leg as her ABI here was 0.48 with monophasic waveforms on the left her ABI was 1.19 with biphasic waveforms. One would wonder if the ABI was artificially elevated on the left. In any case Dr. Cydney Ok note accurately describes the patient's current state of mind. She does not want to rush into an intervention and she is here for our review of her wounds. Noteworthy that when she arrived I think her renal insufficiency was felt to be prerenal. Her GFR on arrival was 22 now 36. Past medical history includes type 2 diabetes with peripheral neuropathy, atrial fibrillation, renal insufficiency We did not calculate ABIs in our clinic this was 0.48 on the right and 1.19 on the left as noted above. Monophasic waveforms on the right biphasic on the left 12/16; patient admitted to the clinic  last week with trauma critical limb ischemia on the right. Today her wound on the left dorsal foot  is healed. She still has the black eschar on the dorsal right foot that we have been using Santyl on. There is apparently some drainage. She arrives in clinic today with more swelling on the right great and the left dorsal surrounding her foot wounds. Some erythema especially on the right. There is some tenderness on the right but not as much as I might expect if this was all infection nevertheless he does have some neuropathy which might be blunting discomfort. She reports no additional trauma. She is at the rehab section of [facility], she lives in the independent part of [place]. She tells me she has some pain in the right leg at night which wakes her up from sleeping although she manages to get back to sleep. She has about 150 foot exercise tolerance and then gets fatigue in her legs. Again I discussed the issue of angiogram with the patient especially on the right. She just will not agree to this. She states she wants the rehab and then go to [facility] where a list of her specialists. She was on Levaquin last week we did not prescribe this but she is finished. She describes some nausea and loose stools but does not sound dramatically unwell with this Left dorsal foot had some very superficial areas today I think related to swelling. The big problem is on the dorsal right foot black surface eschar with increased swelling. She does not have any pain or tenderness although she has neuropathy. We have been using Santyl. She has PAD and again I think needs an angiogram. 12/30; 2-week follow-up. The areas on the right dorsal foot. Everything is healed on the left. She is still in the rehab part of [place]. She went to see Dr. Lucky Cowboy on 12/29. He reiterated to her he felt that she needed angiography on the right leg for critical limb ischemia. She seemed to have 2 major concerns that is wanting to see a  nephrologist prior to procedure in consultation. She does not currently have a nephrologist. She also had no confidence in the original arterial studies that were done in the hospital and wanted them redone. I do not think Dr. Lucky Cowboy felt that they needed to be redone and wanted to proceed with angiography. At the end of it he simply stated that he would be glad to do the angiogram at any time the patient agreed to proceed [name], [name] ([id number]) I looked briefly at her kidney function which had normalized by the time she left the hospital in late November. I do not know that the patient has any additional concern at this point other than the reduction in GFR associated with her diabetes and normal aging. 1/27; I have not seen this patient in almost 4 weeks. She was apparently isolated in the [facility] facility because of COVID-19. She tells me she was mildly symptomatic but feels better now although she still feels like she is in a "fog" as far as her mental abilities. She states that she is up and walking for short distances with a walker. She would like to go back to her independent apartment. Still using Santyl to the wound The patient is not describing a lot of pain. She does not describe claudication but her activity sounds very limited especially when she was under isolation. She is still reluctant to go forward with the angiogram that was suggested. Currently she wants to recover fully from Covid infection 2/10; 2-week follow-up. Patient arrives with 100% nonviable surface  over the wound on her right dorsal foot. I changed her to [facility] from [place] last week in terms of surface the wound is deteriorated although I think the surface area is about the same. She is not describing claudication walking about 50 feet The patient tells me she is trying to get in with her primary doctor however there is restrictions with regards to the pandemic. She wants to be referred to nephrology and  then to vascular surgery at [facility] I cannot help her with this. I did tell her that her creatinine had normalized via the last lab work I could see done at [facility] in November and her estimated GFR was 53 at that time. By my estimation careful application of contrast dye and preangiogram hydration should be able to get her through the necessary angiography with no complication however she simply does not want to hear this and she is going to put this off until she can get through the [facility] system Electronic Signature(s) Signed: 06/22/2019 5:43:36 PM By: Linton Ham MD Entered By: Linton Ham on 06/22/2019 12:32:51 [name], [name] ([id number]) -------------------------------------------------------------------------------- Physical Exam Details Patient Name: [name] Date of Service: 06/22/2019 10:30 AM Medical Record Number: [id number] Patient Account Number: [id number] Date of Birth/Sex: 06/28/1930 (86 y.o. F) Treating RN: Cornell Barman Primary Care Provider: Emily Filbert Other Clinician: Referring Provider: Emily Filbert Treating Provider/Extender: Tito Dine in Treatment: 9 Cardiovascular Her pedal pulses are not palpable her foot is cool but not cold. Notes Wound examination; her dorsal foot wound is 100% covered in a tightly adherent fibrinous debris. I used a #5 curette to attempt to debride this but I am not able to get to a viable surface. I elected to terminate the procedure because of her known arterial insufficiency. There is no evidence of surrounding infection Electronic Signature(s) Signed: 06/22/2019 5:43:36 PM By: Linton Ham MD Entered By: Linton Ham on 06/22/2019 12:33:08 [name], [name] ([id number]) -------------------------------------------------------------------------------- Physician Orders Details Patient Name: [name] Date of Service: 06/22/2019 10:30 AM Medical Record Number: [id number] Patient Account Number: [id number] Date of Birth/Sex:  1931-01-17 (86 y.o. F) Treating RN: Cornell Barman Primary Care Provider: Emily Filbert Other Clinician: Referring Provider: Emily Filbert Treating Provider/Extender: Tito Dine in Treatment: 9 Verbal / Phone Orders: No Diagnosis Coding Wound Cleansing Wound #2 Right,Dorsal Foot o Clean wound with Normal Saline. Anesthetic (add to Medication List) Wound #2 Right,Dorsal Foot o Topical Lidocaine 4% cream applied to wound bed prior to debridement (In Clinic Only). Primary Wound Dressing Wound #2 Right,Dorsal Foot o Santyl Ointment Secondary Dressing Wound #2 Right,Dorsal Foot o Boardered Foam Dressing Dressing Change Frequency Wound #2 Right,Dorsal Foot o Change dressing every day. Follow-up Appointments Wound #2 Right,Dorsal Foot o Return Appointment in 2 weeks. Edema Control Wound #2 Right,Dorsal Foot o Elevate legs to the level of the heart and pump ankles as often as possible Medications-please add to medication list. Wound #2 Right,Dorsal Foot o Santyl Enzymatic Ointment Patient Medications Allergies: No Known Drug Allergies Notifications Medication Indication Start End Santyl 06/22/2019 DOSE topical 250 unit/gram ointment - ointment topical to wound with dressing change daily [name], [name] ([id number]) Electronic Signature(s) Signed: 06/22/2019 12:38:55 PM By: Linton Ham MD Entered By: Linton Ham on 06/22/2019 12:38:54 [name], [name] ([id number]) -------------------------------------------------------------------------------- Problem List Details Patient Name: [name] Date of Service: 06/22/2019 10:30 AM Medical Record Number: [id number] Patient Account Number: [id number] Date of Birth/Sex: 10-17-1930 (86 y.o. F) Treating RN: Cornell Barman Primary Care Provider: Emily Filbert Other Clinician: Referring  Provider: Emily Filbert Treating Provider/Extender: Tito Dine in Treatment: 9 Active Problems ICD-10 Evaluated  Encounter Code Description Active Date Today Diagnosis E11.52 Type 2 diabetes mellitus with diabetic peripheral angiopathy 04/20/2019 No Yes with gangrene E11.42 Type 2 diabetes mellitus with diabetic polyneuropathy 04/20/2019 No Yes L97.528 Non-pressure chronic ulcer of other part of left foot with other 04/20/2019 No Yes specified severity E11.621 Type 2 diabetes mellitus with foot ulcer 04/20/2019 No Yes L97.518 Non-pressure chronic ulcer of other part of right foot with 04/20/2019 No Yes other specified severity Inactive Problems Resolved Problems Electronic Signature(s) Signed: 06/22/2019 5:43:36 PM By: Linton Ham MD Entered By: Linton Ham on 06/22/2019 12:26:06 [name], [name] ([id number]) -------------------------------------------------------------------------------- Progress Note Details Patient Name: [name] Date of Service: 06/22/2019 10:30 AM Medical Record Number: [id number] Patient Account Number: [id number] Date of Birth/Sex: [birth year] (86 y.o. F) Treating RN: Cornell Barman Primary Care Provider: Emily Filbert Other Clinician: Referring Provider: Emily Filbert Treating Provider/Extender: Tito Dine in Treatment: 9 Subjective History of Present Illness (HPI) ADMISSION 04/20/2019 This is an 86 year old woman who lives in the independent part of [place]. She is a type II diabetic on oral agents and insulin with a recent hemoglobin A1c of 6.3 on 10/6. She was initially seen by primary care on 11/12 after dropping a heavy walker on her bilateral dorsal feet. She had blisters on the right and left lateral foot. She was given doxycycline. She was seen the next day in [facility] on 11/13 continued on the.C. Bilateral foot x-rays were negative. She was admitted to hospital from 11/17 through 11/27 with acute kidney injury I did not research this although the patient tells me that at discharge her kidney function had returned to normal. During the course of  this hospitalization it was noted she had wounds on her bilateral feet and her peripheral pulses were nonpalpable. She was evaluated by Dr. Delana Meyer. It was recommended that she consider angiography on the right leg and it was unlikely that she would heal the right leg as her ABI here was 0.48 with monophasic waveforms on the left her ABI was 1.19 with biphasic waveforms. One would wonder if the ABI was artificially elevated on the left. In any case Dr. Cydney Ok note accurately describes the patient's current state of mind. She does not want to rush into an intervention and she is here for our review of her wounds. Noteworthy that when she arrived I think her renal insufficiency was felt to be prerenal. Her GFR on arrival was 22 now 36. Past medical history includes type 2 diabetes with peripheral neuropathy, atrial fibrillation, renal insufficiency We did not calculate ABIs in our clinic this was 0.48 on the right and 1.19 on the left as noted above. Monophasic waveforms on the right biphasic on the left 12/16; patient admitted to the clinic last week with trauma critical limb ischemia on the right. Today her wound on the left dorsal foot is healed. She still has the black eschar on the dorsal right foot that we have been using Santyl on. There is apparently some drainage. She arrives in clinic today with more swelling on the right great and the left dorsal surrounding her foot wounds. Some erythema especially on the right. There is some tenderness on the right but not as much as I might expect if this was all infection nevertheless he does have some neuropathy which might be blunting discomfort. She reports no additional trauma. She is at the rehab section of [facility], she lives in the independent part of [place]. She tells me she has some pain in the right leg at night which wakes her up from sleeping although she manages to get back to sleep. She has about 150 foot exercise tolerance and  then gets fatigue in her legs. Again I discussed the issue of angiogram with the patient especially on the right. She just will not agree to this. She states she wants the rehab and then go to [facility] where a list of her specialists. She was on Levaquin last week we did not prescribe this but she is finished. She describes some nausea and loose stools but does not sound dramatically unwell with this Left dorsal foot had some very superficial areas today I think related to swelling. The big problem is on the dorsal right foot black surface eschar with increased swelling. She does not have any pain or tenderness although she has neuropathy. We have been using Santyl. She has PAD and again I think needs an angiogram. 12/30; 2-week follow-up. The areas on the right dorsal foot. Everything is healed on the left. She is still in the rehab part of [place]. She went to see Dr. Lucky Cowboy on 12/29. He reiterated to her he felt that she needed angiography on the right leg for critical limb ischemia. She seemed to have 2 major concerns that is wanting to see a nephrologist prior to procedure in consultation. She does not currently have a nephrologist. She also had no confidence in the original arterial studies that were done in the hospital and wanted them redone. I do not think Dr. Lucky Cowboy felt that they needed to be redone and wanted to proceed with angiography. At the end of it he simply stated that he would be glad to do the angiogram at any time the patient agreed to proceed [place], [place] [name] ([id number]) I looked briefly at her kidney function which had normalized by the time she left the hospital in late November. I do not know that the patient has any additional concern at this point other than the reduction in GFR associated with her diabetes and normal aging. 1/27; I have not seen this patient in almost 4 weeks. She was apparently isolated in the [facility] facility because of COVID-19. She tells me she was mildly  symptomatic but feels better now although she still feels like she is in a "fog" as far as her mental abilities. She states that she is up and walking for short distances with a walker. She would like to go back to her independent apartment. Still using Santyl to the wound The patient is not describing a lot of pain. She does not describe claudication but her activity sounds very limited especially when she was under isolation. She is still reluctant to go forward with the angiogram that was suggested. Currently she wants to recover fully from Covid infection 2/10; 2-week follow-up. Patient arrives with 100% nonviable surface over the wound on her right dorsal foot. I changed her to [facility] from [place] last week in terms of surface the wound is deteriorated although I think the surface area is about the same. She is not describing claudication walking about 50 feet The patient tells me she is trying to get in with her primary doctor however there is restrictions with regards to the pandemic. She wants to be referred to nephrology and then to vascular surgery at [facility] I cannot help her with this. I did tell her that her  creatinine had normalized via the last lab work I could see done at [facility] in November and her estimated GFR was 53 at that time. By my estimation careful application of contrast dye and preangiogram hydration should be able to get her through the necessary angiography with no complication however she simply does not want to hear this and she is going to put this off until she can get through the [facility] system Objective Constitutional Vitals Time Taken: 10:29 AM, Height: 69 in, Weight: 195 lbs, BMI: 28.8, Temperature: 98.7 F, Pulse: 63 bpm, Respiratory Rate: 16 breaths/min, Blood Pressure: 179/68 mmHg. Cardiovascular Her pedal pulses are not palpable her foot is cool but not cold. General Notes: Wound examination; her dorsal foot wound is 100% covered in a tightly adherent  fibrinous debris. I used a #5 curette to attempt to debride this but I am not able to get to a viable surface. I elected to terminate the procedure because of her known arterial insufficiency. There is no evidence of surrounding infection Integumentary (Hair, Skin) Wound #2 status is Open. Original cause of wound was Trauma. The wound is located on the Right,Dorsal Foot. The wound measures 3.9cm length x 3.6cm width x 0.3cm depth; 11.027cm^2 area and 3.308cm^3 volume. There is Fat Layer (Subcutaneous Tissue) Exposed exposed. There is no tunneling or undermining noted. There is a medium amount of serosanguineous drainage noted. The wound margin is thickened. There is small (1-33%) red granulation within the wound bed. There is a large (67-100%) amount of necrotic tissue within the wound bed including Adherent Slough. Assessment [name], [name] ([id number]) Active Problems ICD-10 Type 2 diabetes mellitus with diabetic peripheral angiopathy with gangrene Type 2 diabetes mellitus with diabetic polyneuropathy Non-pressure chronic ulcer of other part of left foot with other specified severity Type 2 diabetes mellitus with foot ulcer Non-pressure chronic ulcer of other part of right foot with other specified severity Procedures Wound #2 Pre-procedure diagnosis of Wound #2 is a Cellulitis located on the Right,Dorsal Foot . There was a Excisional Skin/Subcutaneous Tissue Debridement with a total area of 13.65 sq cm performed by Ricard Dillon, MD. With the following instrument(s): Curette to remove Viable and Non-Viable tissue/material. Material removed includes Subcutaneous Tissue and Slough and after achieving pain control using Lidocaine. No specimens were taken. A time out was conducted at 10:48, prior to the start of the procedure. A Moderate amount of bleeding was controlled with Pressure. The procedure was tolerated well. Post Debridement Measurements: 3.9cm length x 3.6cm width x 0.3cm depth;  3.308cm^3 volume. Character of Wound/Ulcer Post Debridement is stable. Post procedure Diagnosis Wound #2: Same as Pre-Procedure Plan Wound Cleansing: Wound #2 Right,Dorsal Foot: Clean wound with Normal Saline. Anesthetic (add to Medication List): Wound #2 Right,Dorsal Foot: Topical Lidocaine 4% cream applied to wound bed prior to debridement (In Clinic Only). Primary Wound Dressing: Wound #2 Right,Dorsal Foot: Santyl Ointment Secondary Dressing: Wound #2 Right,Dorsal Foot: Boardered Foam Dressing Dressing Change Frequency: Wound #2 Right,Dorsal Foot: Change dressing every day. Follow-up Appointments: Wound #2 Right,Dorsal Foot: Return Appointment in 2 weeks. Edema Control: Wound #2 Right,Dorsal Foot: Elevate legs to the level of the heart and pump ankles as often as possible Medications-please add to medication list.: Wound #2 Right,Dorsal Foot: Santyl Enzymatic Ointment [name], [name] ([id number]) 1. The patient's wound surface on the right dorsal foot is deteriorated since I last saw her using Hydrofera Blue. I am going to represcribe Santyl which I have done today. She tells me that she has some at home.  2. She has returned to her independent living apartment and has [facility] changing the dressings. 3. I do not believe this patient warrants return weekly. I am not going to do mechanical debridements on this again until she is been seen by vascular surgery. She wants to do this at [facility] Hopefully this will not be delayed too long. We will continue with Santyl for enzymatic debridement with 2-week follow-up. I do not expect this wound to heal like this Electronic Signature(s) Signed: 06/22/2019 5:43:36 PM By: Linton Ham MD Entered By: Linton Ham on 06/22/2019 12:34:43 [name], [name] ([id number]) -------------------------------------------------------------------------------- SuperBill Details Patient Name: [name] Date of Service: 06/22/2019 Medical Record Number:  [id number] Patient Account Number: [id number] Date of Birth/Sex: 1931-01-03 (86 y.o. F) Treating RN: Cornell Barman Primary Care Provider: Emily Filbert Other Clinician: Referring Provider: Emily Filbert Treating Provider/Extender: Tito Dine in Treatment: 9 Diagnosis Coding ICD-10 Codes Code Description E11.52 Type 2 diabetes mellitus with diabetic peripheral angiopathy with gangrene E11.42 Type 2 diabetes mellitus with diabetic polyneuropathy L97.528 Non-pressure chronic ulcer of other part of left foot with other specified severity E11.621 Type 2 diabetes mellitus with foot ulcer L97.518 Non-pressure chronic ulcer of other part of right foot with other specified severity Facility Procedures CPT4 Code Description: [id number] - DEB SUBQ TISSUE 20 SQ CM/< ICD-10 Diagnosis Description 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

## 2019-06-29 ENCOUNTER — Ambulatory Visit: Payer: PRIVATE HEALTH INSURANCE | Admitting: Internal Medicine

## 2019-07-01 NOTE — Progress Notes (Signed)
[place], Marjo ([id number]) Visit Report for 06/08/2019 Debridement Details Patient Name: [name], [name] [facility] Date of Service: 06/08/2019 10:30 AM Medical Record Number: [id number] Patient Account Number: [id number] Date of Birth/Sex: 01-23-1931 (86 y.o. F) Treating RN: Cornell Barman Primary Care Provider: Emily Filbert Other Clinician: Referring Provider: Emily Filbert Treating Provider/Extender: Tito Dine in Treatment: 7 Debridement Performed for Wound #2 Right,Dorsal Foot Assessment: Performed By: Physician Ricard Dillon, MD Debridement Type: Debridement Level of Consciousness (Pre- Awake and Alert procedure): Pre-procedure Verification/Time Yes - 10:41 Out Taken: Start Time: 10:41 Pain Control: Lidocaine Total Area Debrided (L x W): 4.1 (cm) x 3.5 (cm) = 14.35 (cm) Tissue and other material Viable, Non-Viable, Slough, Subcutaneous, Biofilm, Slough debrided: Level: Skin/Subcutaneous Tissue Debridement Description: Excisional Instrument: Curette Bleeding: Minimum Hemostasis Achieved: Pressure End Time: 10:45 Response to Treatment: Procedure was tolerated well Level of Consciousness Awake and Alert (Post-procedure): Post Debridement Measurements of Total Wound Length: (cm) 4.1 Width: (cm) 3.5 Depth: (cm) 0.4 Volume: (cm) 4.508 Character of Wound/Ulcer Post Debridement: Stable Post Procedure Diagnosis Same as Pre-procedure Electronic Signature(s) Signed: 06/08/2019 4:18:13 PM By: Linton Ham MD Signed: 07/01/2019 11:50:22 AM By: Gretta Cool, BSN, RN, CWS, Kim RN, BSN Entered By: Linton Ham on 06/08/2019 11:15:04 [place], Latangela ([id number]) -------------------------------------------------------------------------------- HPI Details Patient Name: [name] Date of Service: 06/08/2019 10:30 AM Medical Record Number: [id number] Patient Account Number: [id number] Date of Birth/Sex: 12-07-1930 (86 y.o. F) Treating RN: Cornell Barman Primary Care Provider: Emily Filbert  Other Clinician: Referring Provider: Emily Filbert Treating Provider/Extender: Tito Dine in Treatment: 7 History of Present Illness HPI Description: ADMISSION 04/20/2019 This is an 86 year old woman who lives in the independent part of [place]. She is a type II diabetic on oral agents and insulin with a recent hemoglobin A1c of 6.3 on 10/6. She was initially seen by primary care on 11/12 after dropping a heavy walker on her bilateral dorsal feet. She had blisters on the right and left lateral foot. She was given doxycycline. She was seen the next day in [facility] on 11/13 continued on the.C. Bilateral foot x-rays were negative. She was admitted to hospital from 11/17 through 11/27 with acute kidney injury I did not research this although the patient tells me that at discharge her kidney function had returned to normal. During the course of this hospitalization it was noted she had wounds on her bilateral feet and her peripheral pulses were nonpalpable. She was evaluated by Dr. Delana Meyer. It was recommended that she consider angiography on the right leg and it was unlikely that she would heal the right leg as her ABI here was 0.48 with monophasic waveforms on the left her ABI was 1.19 with biphasic waveforms. One would wonder if the ABI was artificially elevated on the left. In any case Dr. Cydney Ok note accurately describes the patient's current state of mind. She does not want to rush into an intervention and she is here for our review of her wounds. Noteworthy that when she arrived I think her renal insufficiency was felt to be prerenal. Her GFR on arrival was 22 now 36. Past medical history includes type 2 diabetes with peripheral neuropathy, atrial fibrillation, renal insufficiency We did not calculate ABIs in our clinic this was 0.48 on the right and 1.19 on the left as noted above. Monophasic waveforms on the right biphasic on the left 12/16; patient admitted to the  clinic last week with trauma critical limb ischemia on the right. Today her wound on the left dorsal  foot is healed. She still has the black eschar on the dorsal right foot that we have been using Santyl on. There is apparently some drainage. She arrives in clinic today with more swelling on the right great and the left dorsal surrounding her foot wounds. Some erythema especially on the right. There is some tenderness on the right but not as much as I might expect if this was all infection nevertheless he does have some neuropathy which might be blunting discomfort. She reports no additional trauma. She is at the rehab section of [facility], she lives in the independent part of [place]. She tells me she has some pain in the right leg at night which wakes her up from sleeping although she manages to get back to sleep. She has about 150 foot exercise tolerance and then gets fatigue in her legs. Again I discussed the issue of angiogram with the patient especially on the right. She just will not agree to this. She states she wants the rehab and then go to [facility] where a list of her specialists. She was on Levaquin last week we did not prescribe this but she is finished. She describes some nausea and loose stools but does not sound dramatically unwell with this Left dorsal foot had some very superficial areas today I think related to swelling. The big problem is on the dorsal right foot black surface eschar with increased swelling. She does not have any pain or tenderness although she has neuropathy. We have been using Santyl. She has PAD and again I think needs an angiogram. 12/30; 2-week follow-up. The areas on the right dorsal foot. Everything is healed on the left. She is still in the rehab part of [place]. She went to see Dr. Lucky Cowboy on 12/29. He reiterated to her he felt that she needed angiography on the right leg for critical limb ischemia. She seemed to have 2 major concerns that is wanting to  see a nephrologist prior to procedure in consultation. She does not currently have a nephrologist. She also had no confidence in the original arterial studies that were done in the hospital and wanted them redone. I do not think Dr. Lucky Cowboy felt that they needed to be redone and wanted to proceed with angiography. At the end of it he simply stated that he would be glad to do the angiogram at any time the patient agreed to proceed [place], [place] ([id number]) I looked briefly at her kidney function which had normalized by the time she left the hospital in late November. I do not know that the patient has any additional concern at this point other than the reduction in GFR associated with her diabetes and normal aging. 1/27; I have not seen this patient in almost 4 weeks. She was apparently isolated in the [facility] facility because of COVID-19. She tells me she was mildly symptomatic but feels better now although she still feels like she is in a "fog" as far as her mental abilities. She states that she is up and walking for short distances with a walker. She would like to go back to her independent apartment. Still using Santyl to the wound The patient is not describing a lot of pain. She does not describe claudication but her activity sounds very limited especially when she was under isolation. She is still reluctant to go forward with the angiogram that was suggested. Currently she wants to recover fully from Covid infection Electronic Signature(s) Signed: 06/08/2019 4:18:13 PM By: Dellia Nims,  Legrand Como MD Entered By: Linton Ham on 06/08/2019 11:18:43 [place], [place] ([id number]) -------------------------------------------------------------------------------- Physical Exam Details Patient Name: [name] Date of Service: 06/08/2019 10:30 AM Medical Record Number: [id number] Patient Account Number: [id number] Date of Birth/Sex: May 08, 1931 (86 y.o. F) Treating RN: Cornell Barman Primary Care Provider:  Emily Filbert Other Clinician: Referring Provider: Emily Filbert Treating Provider/Extender: Tito Dine in Treatment: 7 Respiratory Respiratory effort is easy and symmetric bilaterally. Rate is normal at rest and on room air.. Cardiovascular Wound is about twice the size of what it was 4 weeks ago. Pedal pulses absent bilaterally.. Some swelling in the foot. Integumentary (Hair, Skin) There is no erythema around the wound. Notes Wound exam; oOn the right side the black ischemic eschar on top of the wound is fully separated. But I 100% covered in a fibrinous adherent debris which I removed with a #5 curette minimal bleeding controlled with direct pressure. Electronic Signature(s) Signed: 06/08/2019 4:18:13 PM By: Linton Ham MD Entered By: Linton Ham on 06/08/2019 11:20:36 [place], Lucilla ([id number]) -------------------------------------------------------------------------------- Physician Orders Details Patient Name: [name] Date of Service: 06/08/2019 10:30 AM Medical Record Number: [id number] Patient Account Number: [id number] Date of Birth/Sex: [birth year] (86 y.o. F) Treating RN: Cornell Barman Primary Care Provider: Emily Filbert Other Clinician: Referring Provider: Emily Filbert Treating Provider/Extender: Tito Dine in Treatment: 7 Verbal / Phone Orders: No Diagnosis Coding Wound Cleansing Wound #2 Right,Dorsal Foot o Clean wound with Normal Saline. Anesthetic (add to Medication List) Wound #2 Right,Dorsal Foot o Topical Lidocaine 4% cream applied to wound bed prior to debridement (In Clinic Only). Primary Wound Dressing Wound #2 Right,Dorsal Foot o Hydrafera Blue Ready Transfer Secondary Dressing o Tegaderm Dressing Change Frequency Wound #2 Right,Dorsal Foot o Change Dressing Monday, Wednesday, Friday Follow-up Appointments Wound #2 Right,Dorsal Foot o Return Appointment in 2 weeks. Edema Control Wound #2 Right,Dorsal  Foot o Elevate legs to the level of the heart and pump ankles as often as possible Electronic Signature(s) Signed: 06/08/2019 4:18:13 PM By: Linton Ham MD Signed: 07/01/2019 11:50:22 AM By: Gretta Cool, BSN, RN, CWS, Kim RN, BSN Entered By: Gretta Cool, BSN, RN, CWS, Kim on 06/08/2019 10:53:55 [name], Shatisha ([id number]) -------------------------------------------------------------------------------- Problem List Details Patient Name: [name] Date of Service: 06/08/2019 10:30 AM Medical Record Number: [id number] Patient Account Number: [id number] Date of Birth/Sex: 03-24-1931 (86 y.o. F) Treating RN: Cornell Barman Primary Care Provider: Emily Filbert Other Clinician: Referring Provider: Emily Filbert Treating Provider/Extender: Tito Dine in Treatment: 7 Active Problems ICD-10 Evaluated Encounter Code Description Active Date Today Diagnosis E11.52 Type 2 diabetes mellitus with diabetic peripheral angiopathy 04/20/2019 No Yes with gangrene E11.42 Type 2 diabetes mellitus with diabetic polyneuropathy 04/20/2019 No Yes L97.528 Non-pressure chronic ulcer of other part of left foot with other 04/20/2019 No Yes specified severity E11.621 Type 2 diabetes mellitus with foot ulcer 04/20/2019 No Yes L97.518 Non-pressure chronic ulcer of other part of right foot with 04/20/2019 No Yes other specified severity Inactive Problems Resolved Problems Electronic Signature(s) Signed: 06/08/2019 4:18:13 PM By: Linton Ham MD Entered By: Linton Ham on 06/08/2019 11:14:27 [place], [place] ([id number]) -------------------------------------------------------------------------------- Progress Note Details Patient Name: [name] Date of Service: 06/08/2019 10:30 AM Medical Record Number: [id number] Patient Account Number: [id number] Date of Birth/Sex: 15-Dec-1930 (86 y.o. F) Treating RN: Cornell Barman Primary Care Provider: Emily Filbert Other Clinician: Referring Provider: Emily Filbert Treating  Provider/Extender: Tito Dine in Treatment: 7 Subjective History of Present Illness (HPI) ADMISSION 04/20/2019 This is an 86 year old woman who lives in  the independent part of [place]. She is a type II diabetic on oral agents and insulin with a recent hemoglobin A1c of 6.3 on 10/6. She was initially seen by primary care on 11/12 after dropping a heavy walker on her bilateral dorsal feet. She had blisters on the right and left lateral foot. She was given doxycycline. She was seen the next day in [facility] on 11/13 continued on the.C. Bilateral foot x-rays were negative. She was admitted to hospital from 11/17 through 11/27 with acute kidney injury I did not research this although the patient tells me that at discharge her kidney function had returned to normal. During the course of this hospitalization it was noted she had wounds on her bilateral feet and her peripheral pulses were nonpalpable. She was evaluated by Dr. Delana Meyer. It was recommended that she consider angiography on the right leg and it was unlikely that she would heal the right leg as her ABI here was 0.48 with monophasic waveforms on the left her ABI was 1.19 with biphasic waveforms. One would wonder if the ABI was artificially elevated on the left. In any case Dr. Cydney Ok note accurately describes the patient's current state of mind. She does not want to rush into an intervention and she is here for our review of her wounds. Noteworthy that when she arrived I think her renal insufficiency was felt to be prerenal. Her GFR on arrival was 22 now 36. Past medical history includes type 2 diabetes with peripheral neuropathy, atrial fibrillation, renal insufficiency We did not calculate ABIs in our clinic this was 0.48 on the right and 1.19 on the left as noted above. Monophasic waveforms on the right biphasic on the left 12/16; patient admitted to the clinic last week with trauma critical limb ischemia on the  right. Today her wound on the left dorsal foot is healed. She still has the black eschar on the dorsal right foot that we have been using Santyl on. There is apparently some drainage. She arrives in clinic today with more swelling on the right great and the left dorsal surrounding her foot wounds. Some erythema especially on the right. There is some tenderness on the right but not as much as I might expect if this was all infection nevertheless he does have some neuropathy which might be blunting discomfort. She reports no additional trauma. She is at the rehab section of [facility], she lives in the independent part of [place]. She tells me she has some pain in the right leg at night which wakes her up from sleeping although she manages to get back to sleep. She has about 150 foot exercise tolerance and then gets fatigue in her legs. Again I discussed the issue of angiogram with the patient especially on the right. She just will not agree to this. She states she wants the rehab and then go to [facility] where a list of her specialists. She was on Levaquin last week we did not prescribe this but she is finished. She describes some nausea and loose stools but does not sound dramatically unwell with this Left dorsal foot had some very superficial areas today I think related to swelling. The big problem is on the dorsal right foot black surface eschar with increased swelling. She does not have any pain or tenderness although she has neuropathy. We have been using Santyl. She has PAD and again I think needs an angiogram. 12/30; 2-week follow-up. The areas on the right dorsal foot. Everything is  healed on the left. She is still in the rehab part of [place]. She went to see Dr. Lucky Cowboy on 12/29. He reiterated to her he felt that she needed angiography on the right leg for critical limb ischemia. She seemed to have 2 major concerns that is wanting to see a nephrologist prior to procedure in consultation.  She does not currently have a nephrologist. She also had no confidence in the original arterial studies that were done in the hospital and wanted them redone. I do not think Dr. Lucky Cowboy felt that they needed to be redone and wanted to proceed with angiography. At the end of it he simply stated that he would be glad to do the angiogram at any time the patient agreed to proceed [place], [place] ([id number]) I looked briefly at her kidney function which had normalized by the time she left the hospital in late November. I do not know that the patient has any additional concern at this point other than the reduction in GFR associated with her diabetes and normal aging. 1/27; I have not seen this patient in almost 4 weeks. She was apparently isolated in the [facility] facility because of COVID-19. She tells me she was mildly symptomatic but feels better now although she still feels like she is in a "fog" as far as her mental abilities. She states that she is up and walking for short distances with a walker. She would like to go back to her independent apartment. Still using Santyl to the wound The patient is not describing a lot of pain. She does not describe claudication but her activity sounds very limited especially when she was under isolation. She is still reluctant to go forward with the angiogram that was suggested. Currently she wants to recover fully from Covid infection Objective Constitutional Vitals Time Taken: 10:27 AM, Height: 69 in, Weight: 195 lbs, BMI: 28.8, Temperature: 98.9 F, Pulse: 57 bpm, Respiratory Rate: 16 breaths/min, Blood Pressure: 190/68 mmHg. Respiratory Respiratory effort is easy and symmetric bilaterally. Rate is normal at rest and on room air.. Cardiovascular Wound is about twice the size of what it was 4 weeks ago. Pedal pulses absent bilaterally.. Some swelling in the foot. General Notes: Wound exam; On the right side the black ischemic eschar on top of the wound is  fully separated. But I 100% covered in a fibrinous adherent debris which I removed with a #5 curette minimal bleeding controlled with direct pressure. Integumentary (Hair, Skin) There is no erythema around the wound. Wound #2 status is Open. Original cause of wound was Trauma. The wound is located on the Right,Dorsal Foot. The wound measures 4.1cm length x 3.5cm width x 0.3cm depth; 11.27cm^2 area and 3.381cm^3 volume. There is Fat Layer (Subcutaneous Tissue) Exposed exposed. There is no tunneling or undermining noted. There is a small amount of serosanguineous drainage noted. The wound margin is thickened. There is small (1-33%) red granulation within the wound bed. There is a large (67-100%) amount of necrotic tissue within the wound bed including Eschar. 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 There was a Excisional Skin/Subcutaneous Tissue  Debridement with a total area of 14.35 sq cm performed by Ricard Dillon, MD. With the following instrument(s): Curette to remove Viable and Non-Viable tissue/material. Material removed includes Subcutaneous Tissue, Slough, and Biofilm after achieving pain control using Lidocaine. No specimens were taken. A time out was conducted at 10:41, prior to the start of the procedure. A Minimum amount of bleeding was controlled with Pressure. The procedure was tolerated well. Post Debridement Measurements: 4.1cm length x 3.5cm width x 0.4cm depth; 4.508cm^3 volume. Character of Wound/Ulcer Post  Debridement is stable. Post procedure Diagnosis Wound #2: Same as Pre-Procedure Plan Wound Cleansing: Wound #2 Right,Dorsal Foot: Clean wound with Normal Saline. Anesthetic (add to Medication List): Wound #2 Right,Dorsal Foot: Topical Lidocaine 4% cream applied to wound bed prior to debridement (In Clinic Only). Primary Wound Dressing: Wound #2 Right,Dorsal Foot: Hydrafera Blue Ready Transfer Secondary Dressing: Tegaderm Dressing Change Frequency: Wound #2 Right,Dorsal Foot: Change Dressing Monday, Wednesday, Friday Follow-up Appointments: Wound #2 Right,Dorsal Foot: Return Appointment in 2 weeks. Edema Control: Wound #2 Right,Dorsal Foot: Elevate legs to the level of the heart and pump ankles as often as possible 1. I change the primary dressing to [facility] Blue covered in Tegaderm and gauze. This will be changed 3 times a week by home health or the nurses at [facility]. 2. The patient seemed to be suggesting that we had the final say and whether she returns to her own apartment. I told her that from a wound care point of view it certainly possible to change these dressings by what ever mechanism can be arranged either by home health or the nurses at [facility]. HOWEVER I am not the final arbiter about whether she is independent with ADLs and IADLs she needs to go through [facility] to determine this. 3. I have reviewed the patient's arterial situation. She had an ABI on the right initially when she was in the hospital of 0.48 with [place], [place] ([id number]) monophasic waveforms they did not do a TBI. She was much better on the left indeed the traumatic wound on the left dorsal foot is healed. Electronic Signature(s) Signed: 06/08/2019 4:18:13 PM By: Linton Ham MD Entered By: Linton Ham on 06/08/2019 11:22:33 [place], Jaiyah ([id number]) -------------------------------------------------------------------------------- SuperBill Details Patient Name: [name] Date of  Service: 06/08/2019 Medical Record Number: [id number] Patient Account Number: [id number] Date of Birth/Sex: 07/21/1930 (86 y.o. 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

## 2019-07-01 NOTE — Progress Notes (Signed)
[place], [place] ([id number]) Visit Report for 06/08/2019 Arrival Information Details Patient Name: [name], [name] [facility] Date of Service: 06/08/2019 10:30 AM Medical Record Number: [id number] Patient Account Number: [id number] Date of Birth/Sex: [birth year] (86 y.o. F) Treating RN: Army Melia Primary Care Daisha Filosa: Emily Filbert Other Clinician: Referring Cedra Villalon: Emily Filbert Treating Samiah Ricklefs/Extender: Tito Dine in Treatment: 7 Visit Information History Since Last Visit Added or deleted any medications: No Patient Arrived: Wheel Chair Any new allergies or adverse reactions: No Arrival Time: 10:26 Had a fall or experienced change in No Accompanied By: self activities of daily living that may affect Transfer Assistance: None risk of falls: Patient Identification Verified: Yes Signs or symptoms of abuse/neglect since last visito No Patient Has Alerts: Yes Hospitalized since last visit: No Patient Alerts: Patient on Blood Thinner Has Dressing in Place as Prescribed: Yes ABI L 1.19 R 0.48 03/2019 Pain Present Now: No 2-325mg  Aspirin daily Electronic Signature(s) Signed: 06/08/2019 11:14:16 AM By: Army Melia Entered By: Army Melia on 06/08/2019 10:27:03 [name], [name] ([id number]) -------------------------------------------------------------------------------- Encounter Discharge Information Details Patient Name: [name] Date of Service: 06/08/2019 10:30 AM Medical Record Number: [id number] Patient Account Number: [id number] Date of Birth/Sex: May 14, 1930 (86 y.o. F) Treating RN: Cornell Barman Primary Care Tasha Jindra: Emily Filbert Other Clinician: Referring Juddson Cobern: Emily Filbert Treating Nancyann Cotterman/Extender: Tito Dine in Treatment: 7 Encounter Discharge Information Items Post Procedure Vitals Discharge Condition: Stable Temperature (F): 98.9 Ambulatory Status: Ambulatory Pulse (bpm): 57 Discharge Destination: Home Respiratory Rate (breaths/min):  16 Transportation: Private Auto Blood Pressure (mmHg): 190/68 Accompanied By: self Schedule Follow-up Appointment: Yes Clinical Summary of Care: Electronic Signature(s) Signed: 07/01/2019 11:50:22 AM By: Gretta Cool, BSN, RN, CWS, Kim RN, BSN Entered By: Gretta Cool, BSN, RN, CWS, Kim on 06/08/2019 10:47:50 [name], [name] ([id number]) -------------------------------------------------------------------------------- Lower Extremity Assessment Details Patient Name: [name] Date of Service: 06/08/2019 10:30 AM Medical Record Number: [id number] Patient Account Number: [id number] Date of Birth/Sex: [birth year] (86 y.o. F) Treating RN: Army Melia Primary Care Niklas Chretien: Emily Filbert Other Clinician: Referring Moishe Schellenberg: Emily Filbert Treating Ares Tegtmeyer/Extender: Ricard Dillon Weeks in Treatment: 7 Edema Assessment Assessed: [Left: No] [Right: No] Edema: [Left: N] [Right: o] Vascular Assessment Pulses: Dorsalis Pedis Palpable: [Right:Yes] Electronic Signature(s) Signed: 06/08/2019 11:14:16 AM By: Army Melia Entered By: Army Melia on 06/08/2019 10:31:55 [name], [name] ([id number]) -------------------------------------------------------------------------------- Multi Wound Chart Details Patient Name: [name] Date of Service: 06/08/2019 10:30 AM Medical Record Number: [id number] Patient Account Number: [id number] Date of Birth/Sex: 11-Jan-1931 (86 y.o. F) Treating RN: Cornell Barman Primary Care Bonni Neuser: Emily Filbert Other Clinician: Referring Renise Gillies: Emily Filbert Treating Santhosh Gulino/Extender: Tito Dine in Treatment: 7 Vital Signs Height(in): 69 Pulse(bpm): 38 Weight(lbs): 195 Blood Pressure(mmHg): 190/68 Body Mass Index(BMI): 29 Temperature(F): 98.9 Respiratory Rate 16 (breaths/min): Photos: [N/A:N/A] Wound Location: Right Foot - Dorsal N/A N/A Wounding Event: Trauma N/A N/A Primary Etiology: Cellulitis N/A N/A Comorbid History: Cataracts, Arrhythmia, N/A  N/A Congestive Heart Failure, Hypertension, Type II Diabetes, Rheumatoid Arthritis Date Acquired: 03/24/2019 N/A N/A Weeks of Treatment: 7 N/A N/A Wound Status: Open N/A N/A Measurements L x W x D 4.1x3.5x0.3 N/A N/A (cm) Area (cm) : 11.27 N/A N/A Volume (cm) : 3.381 N/A N/A % Reduction in Area: -70.80% N/A N/A % Reduction in Volume: -412.30% N/A N/A Classification: Full Thickness Without N/A N/A Exposed Support Structures Exudate Amount: Small N/A N/A Exudate Type: Serosanguineous N/A N/A Exudate Color: red, brown N/A N/A Wound Margin: Thickened N/A N/A Granulation Amount: Small (1-33%) N/A N/A Granulation Quality: Red N/A N/A Necrotic Amount:  Large (67-100%) N/A N/A Necrotic Tissue: Eschar N/A N/A Exposed Structures: Fat Layer (Subcutaneous N/A N/A Tissue) Exposed: Yes Fascia: No [name], [name] ([id number]) Tendon: No Muscle: No Joint: No Bone: No Epithelialization: None N/A N/A Debridement: Debridement - Excisional N/A N/A Pre-procedure 10:41 N/A N/A Verification/Time Out Taken: Pain Control: Lidocaine N/A N/A Tissue Debrided: Subcutaneous, Slough N/A N/A Level: Skin/Subcutaneous Tissue N/A N/A Debridement Area (sq cm): 14.35 N/A N/A Instrument: Curette N/A N/A Bleeding: Minimum N/A N/A Hemostasis Achieved: Pressure N/A N/A Debridement Treatment Procedure was tolerated well N/A N/A Response: Post Debridement 4.1x3.5x0.4 N/A N/A Measurements L x W x D (cm) Post Debridement Volume: 4.508 N/A N/A (cm) Procedures Performed: Debridement N/A N/A Treatment Notes Wound #2 (Right, Dorsal Foot) Notes Hydrofera Blue, tegaderm to secure Electronic Signature(s) Signed: 06/08/2019 4:18:13 PM By: Linton Ham MD Entered By: Linton Ham on 06/08/2019 11:14:54 [name], [name] ([id number]) -------------------------------------------------------------------------------- Multi-Disciplinary Care Plan Details Patient Name: [name] Date of Service: 06/08/2019 10:30  AM Medical Record Number: [id number] Patient Account Number: [id number] Date of Birth/Sex: 16-Dec-1930 (86 y.o. F) Treating RN: Cornell Barman Primary Care Torian Thoennes: Emily Filbert Other Clinician: Referring Latunya Kissick: Emily Filbert Treating Tiffiny Worthy/Extender: Tito Dine in Treatment: 7 Active Inactive Necrotic Tissue Nursing Diagnoses: Impaired tissue integrity related to necrotic/devitalized tissue Knowledge deficit related to management of necrotic/devitalized tissue Goals: Necrotic/devitalized tissue will be minimized in the wound bed Date Initiated: 04/20/2019 Target Resolution Date: 05/04/2019 Goal Status: Active Interventions: Assess patient pain level pre-, during and post procedure and prior to discharge Provide education on necrotic tissue and debridement process Treatment Activities: Apply topical anesthetic as ordered : 04/20/2019 Notes: Orientation to the Wound Care Program Nursing Diagnoses: Knowledge deficit related to the wound healing center program Goals: Patient/caregiver will verbalize understanding of the [place] Date Initiated: 04/20/2019 Target Resolution Date: 05/04/2019 Goal Status: Active Interventions: Provide education on orientation to the wound center Notes: Venous Leg Ulcer Nursing Diagnoses: Actual venous Insuffiency (use after diagnosis is confirmed) Goals: Patient/caregiver will verbalize understanding of disease process and disease management [name], [name] ([id number]) Date Initiated: 04/20/2019 Target Resolution Date: 05/04/2019 Goal Status: Active Interventions: Assess peripheral edema status every visit. Notes: Wound/Skin Impairment Nursing Diagnoses: Impaired tissue integrity Goals: Patient/caregiver will verbalize understanding of skin care regimen Date Initiated: 04/20/2019 Target Resolution Date: 05/04/2019 Goal Status: Active Ulcer/skin breakdown will have a volume reduction of 30% by week 4 Date  Initiated: 04/20/2019 Target Resolution Date: 05/18/2019 Goal Status: Active Interventions: Assess ulceration(s) every visit Treatment Activities: Skin care regimen initiated : 04/20/2019 Topical wound management initiated : 04/20/2019 Notes: Electronic Signature(s) Signed: 07/01/2019 11:50:22 AM By: Gretta Cool, BSN, RN, CWS, Kim RN, BSN Entered By: Gretta Cool, BSN, RN, CWS, Kim on 06/08/2019 10:39:51 [name], [name] ([id number]) -------------------------------------------------------------------------------- Pain Assessment Details Patient Name: [name] Date of Service: 06/08/2019 10:30 AM Medical Record Number: [id number] Patient Account Number: [id number] Date of Birth/Sex: [birth year] (86 y.o. F) Treating RN: Army Melia Primary Care [name]: Emily Filbert Other Clinician: Referring Raychell Holcomb: Emily Filbert Treating Sonny Poth/Extender: Tito Dine in Treatment: 7 Active Problems Location of Pain Severity and Description of Pain Patient Has Paino No Site Locations Pain Management and Medication Current Pain Management: Electronic Signature(s) Signed: 06/08/2019 11:14:16 AM By: Army Melia Entered By: Army Melia on 06/08/2019 10:27:09 [name], [name] ([id number]) -------------------------------------------------------------------------------- Patient/Caregiver Education Details Patient Name: [name] Date of Service: 06/08/2019 10:30 AM Medical Record Number: [id number] Patient Account Number: [id number] Date of Birth/Gender: 10-21-1930 (86 y.o. F) Treating RN: Cornell Barman Primary Care Physician: Sabra Heck,  Mark Other Clinician: Referring Physician: Emily Filbert Treating Physician/Extender: Tito Dine in Treatment: 7 Education Assessment Education Provided To: Patient Education Topics Provided Wound Debridement: Handouts: Wound Debridement Methods: Demonstration, Explain/Verbal Responses: State content correctly Electronic Signature(s) Signed: 07/01/2019 11:50:22  AM By: Gretta Cool, BSN, RN, CWS, Kim RN, BSN Entered By: Gretta Cool, BSN, RN, CWS, Kim on 06/08/2019 10:46:17 [name], [name] ([id number]) -------------------------------------------------------------------------------- Wound Assessment Details Patient Name: [name] Date of Service: 06/08/2019 10:30 AM Medical Record Number: [id number] Patient Account Number: [id number] Date of Birth/Sex: 1931/01/28 (86 y.o. F) Treating RN: Army Melia Primary Care Knox Holdman: Emily Filbert Other Clinician: Referring Thedore Pickel: Emily Filbert Treating Jimmye Wisnieski/Extender: Tito Dine in Treatment: 7 Wound Status Wound Number: 2 Primary Cellulitis Etiology: Wound Location: Right Foot - Dorsal Wound Open Wounding Event: Trauma Status: Date Acquired: 03/24/2019 Comorbid Cataracts, Arrhythmia, Congestive Heart Weeks Of Treatment: 7 History: Failure, Hypertension, Type II Diabetes, Clustered Wound: No Rheumatoid Arthritis Photos Wound Measurements Length: (cm) 4.1 Width: (cm) 3.5 Depth: (cm) 0.3 Area: (cm) 11.27 Volume: (cm) 3.381 % Reduction in Area: -70.8% % Reduction in Volume: -412.3% Epithelialization: None Tunneling: No Undermining: No Wound Description Full Thickness Without Exposed Support Classification: Structures Wound Margin: Thickened Exudate Small Amount: Exudate Type: Serosanguineous Exudate Color: red, brown Foul Odor After Cleansing: No Slough/Fibrino Yes Wound Bed Granulation Amount: Small (1-33%) Exposed Structure Granulation Quality: Red Fascia Exposed: No Necrotic Amount: Large (67-100%) Fat Layer (Subcutaneous Tissue) Exposed: Yes Necrotic Quality: Eschar Tendon Exposed: No Muscle Exposed: No Joint Exposed: No Bone Exposed: No Keeter, Makeshia ([id number]) Electronic Signature(s) Signed: 06/08/2019 11:14:16 AM By: Army Melia Entered By: Army Melia on 06/08/2019 10:31:40 Mckiernan, Nazia  ([id number]) -------------------------------------------------------------------------------- Vitals Details Patient Name: [name] Date of Service: 06/08/2019 10:30 AM Medical Record Number: [id number] Patient Account Number: [id number] Date of Birth/Sex: December 29, 1930 (86 y.o. F) Treating RN: Army Melia Primary Care Turki Tapanes: Emily Filbert Other Clinician: Referring Clarice Bonaventure: Emily Filbert Treating Savier Trickett/Extender: Tito Dine in Treatment: 7 Vital Signs Time Taken: 10:27 Temperature (F): 98.9 Height (in): 69 Pulse (bpm): 57 Weight (lbs): 195 Respiratory Rate (breaths/min): 16 Body Mass Index (BMI): 28.8 Blood Pressure (mmHg): 190/68 Reference Range: 80 - 120 mg / dl Electronic Signature(s) Signed: 06/08/2019 11:14:16 AM By: Army Melia Entered By: Army Melia on 06/08/2019 10:28:52

## 2019-07-06 ENCOUNTER — Other Ambulatory Visit: Payer: Self-pay

## 2019-07-06 ENCOUNTER — Encounter: Payer: Medicare Other | Admitting: Internal Medicine

## 2019-07-06 DIAGNOSIS — E1152 Type 2 diabetes mellitus with diabetic peripheral angiopathy with gangrene: Secondary | ICD-10-CM | POA: Diagnosis not present

## 2019-07-13 ENCOUNTER — Other Ambulatory Visit: Payer: Self-pay

## 2019-07-13 ENCOUNTER — Encounter: Payer: Medicare Other | Attending: Internal Medicine | Admitting: Internal Medicine

## 2019-07-13 DIAGNOSIS — I4891 Unspecified atrial fibrillation: Secondary | ICD-10-CM | POA: Diagnosis not present

## 2019-07-13 DIAGNOSIS — L97518 Non-pressure chronic ulcer of other part of right foot with other specified severity: Secondary | ICD-10-CM | POA: Diagnosis not present

## 2019-07-13 DIAGNOSIS — W228XXA Striking against or struck by other objects, initial encounter: Secondary | ICD-10-CM | POA: Insufficient documentation

## 2019-07-13 DIAGNOSIS — S90822A Blister (nonthermal), left foot, initial encounter: Secondary | ICD-10-CM | POA: Insufficient documentation

## 2019-07-13 DIAGNOSIS — L97528 Non-pressure chronic ulcer of other part of left foot with other specified severity: Secondary | ICD-10-CM | POA: Insufficient documentation

## 2019-07-13 DIAGNOSIS — Z8616 Personal history of COVID-19: Secondary | ICD-10-CM | POA: Diagnosis not present

## 2019-07-13 DIAGNOSIS — E11621 Type 2 diabetes mellitus with foot ulcer: Secondary | ICD-10-CM | POA: Insufficient documentation

## 2019-07-13 DIAGNOSIS — E1151 Type 2 diabetes mellitus with diabetic peripheral angiopathy without gangrene: Secondary | ICD-10-CM | POA: Insufficient documentation

## 2019-07-13 DIAGNOSIS — E1142 Type 2 diabetes mellitus with diabetic polyneuropathy: Secondary | ICD-10-CM | POA: Insufficient documentation

## 2019-07-13 DIAGNOSIS — S90821A Blister (nonthermal), right foot, initial encounter: Secondary | ICD-10-CM | POA: Insufficient documentation

## 2019-07-13 NOTE — Progress Notes (Signed)
[name], [name] Visit Report for 07/13/2019 Debridement Details Patient Name: [name], [name] [facility] Date of Service: 07/13/2019 3:15 PM Medical Record Number: [id number] Patient Account Number: [id number] Date of Birth/Sex: [birth year] (86 y.o. F) Treating RN: Cornell Barman Primary Care Provider: Emily Filbert Other Clinician: Referring Provider: Emily Filbert Treating Provider/Extender: Tito Dine in Treatment: 12 Debridement Performed for Wound #2 Right,Dorsal Foot Assessment: Performed By: Physician Ricard Dillon, MD Debridement Type: Chemical/Enzymatic/Mechanical Agent Used: Santyl Level of Consciousness (Pre- Awake and Alert procedure): Pre-procedure Verification/Time Out Yes - 15:57 Taken: Start Time: 15:57 Pain Control: Lidocaine Instrument: Other : tongue blade Bleeding: None Response to Treatment: Procedure was tolerated well Level of Consciousness (Post- Awake and Alert procedure): Post Debridement Measurements of Total Wound Length: (cm) 4 Width: (cm) 3.5 Depth: (cm) 0.2 Volume: (cm) 2.199 Character of Wound/Ulcer Post Debridement: Stable Post Procedure Diagnosis Same as Pre-procedure Electronic Signature(s) Signed: 07/13/2019 4:40:39 PM By: Gretta Cool, BSN, RN, CWS, Kim RN, BSN Signed: 07/13/2019 4:59:37 PM By: Linton Ham MD Entered By: Linton Ham on 07/13/2019 16:27:36 [name], [name] ([id number]) -------------------------------------------------------------------------------- HPI Details Patient Name: [name] Date of Service: 07/13/2019 3:15 PM Medical Record Number: [id number] Patient Account Number: [id number] Date of Birth/Sex: 05/14/1930 (86 y.o. F) Treating RN: Cornell Barman Primary Care Provider: Emily Filbert Other Clinician: Referring Provider: Emily Filbert Treating Provider/Extender: Tito Dine in Treatment: 12 History of Present Illness HPI Description: ADMISSION 04/20/2019 This is an 86 year old woman who lives in the  independent part of [place]. She is a type II diabetic on oral agents and insulin with a recent hemoglobin A1c of 6.3 on 10/6. She was initially seen by primary care on 11/12 after dropping a heavy walker on her bilateral dorsal feet. She had blisters on the right and left lateral foot. She was given doxycycline. She was seen the next day in [place] clinic on 11/13 continued on the.C. Bilateral foot x-rays were negative. She was admitted to hospital from 11/17 through 11/27 with acute kidney injury I did not research this although the patient tells me that at discharge her kidney function had returned to normal. During the course of this hospitalization it was noted she had wounds on her bilateral feet and her peripheral pulses were nonpalpable. She was evaluated by Dr. Delana Meyer. It was recommended that she consider angiography on the right leg and it was unlikely that she would heal the right leg as her ABI here was 0.48 with monophasic waveforms on the left her ABI was 1.19 with biphasic waveforms. One would wonder if the ABI was artificially elevated on the left. In any case Dr. Cydney Ok note accurately describes the patient's current state of mind. She does not want to rush into an intervention and she is here for our review of her wounds. Noteworthy that when she arrived I think her renal insufficiency was felt to be prerenal. Her GFR on arrival was 22 now 36. Past medical history includes type 2 diabetes with peripheral neuropathy, atrial fibrillation, renal insufficiency We did not calculate ABIs in our clinic this was 0.48 on the right and 1.19 on the left as noted above. Monophasic waveforms on the right biphasic on the left 12/16; patient admitted to the clinic last week with trauma critical limb ischemia on the right. Today her wound on the left dorsal foot is healed. She still has the black eschar on the dorsal right foot that we have been using Santyl on. There is apparently some  drainage. She arrives in clinic  today with more swelling on the right great and the left dorsal surrounding her foot wounds. Some erythema especially on the right. There is some tenderness on the right but not as much as I might expect if this was all infection nevertheless he does have some neuropathy which might be blunting discomfort. She reports no additional trauma. She is at the rehab section of [facility], she lives in the independent part of [place]. She tells me she has some pain in the right leg at night which wakes her up from sleeping although she manages to get back to sleep. She has about 150 foot exercise tolerance and then gets fatigue in her legs. Again I discussed the issue of angiogram with the patient especially on the right. She just will not agree to this. She states she wants the rehab and then go to [facility] where a list of her specialists. She was on Levaquin last week we did not prescribe this but she is finished. She describes some nausea and loose stools but does not sound dramatically unwell with this Left dorsal foot had some very superficial areas today I think related to swelling. The big problem is on the dorsal right foot black surface eschar with increased swelling. She does not have any pain or tenderness although she has neuropathy. We have been using Santyl. She has PAD and again I think needs an angiogram. 12/30; 2-week follow-up. The areas on the right dorsal foot. Everything is healed on the left. She is still in the rehab part of [place]. She went to see Dr. Lucky Cowboy on 12/29. He reiterated to her he felt that she needed angiography on the right leg for critical limb ischemia. She seemed to have 2 major concerns that is wanting to see a nephrologist prior to procedure in consultation. She does not currently have a nephrologist. She also had no confidence in the original arterial studies that were done in the hospital and wanted them redone. I do not think Dr. Lucky Cowboy  felt that they needed to be redone and wanted to proceed with angiography. At the end of it he simply stated that he would be glad to do the angiogram at any time the patient agreed to proceed I looked briefly at her kidney function which had normalized by the time she left the hospital in late November. I do not know that the patient has any additional concern at this point other than the reduction in GFR associated with her diabetes and normal aging. 1/27; I have not seen this patient in almost 4 weeks. She was apparently isolated in the [facility] facility because of COVID-19. She tells me she was mildly symptomatic but feels better now although she still feels like she is in a "fog" as far as her mental abilities. She states that she is up and walking for short distances with a walker. She would like to go back to her independent apartment. Still using Santyl to the wound The patient is not describing a lot of pain. She does not describe claudication but her activity sounds very limited especially when she was under isolation. She is still reluctant to go forward with the angiogram that was suggested. Currently she wants to recover fully from Covid infection 2/10; 2-week follow-up. Patient arrives with 100% nonviable surface over the wound on her right dorsal foot. I changed her to [facility] from [place] last week in terms of surface the wound is deteriorated although I think the surface area  is about the same. She is not describing claudication walking about 50 feet The patient tells me she is trying to get in with her primary doctor however there is restrictions with regards to the pandemic. She wants to be referred to nephrology and then to vascular surgery at [facility] I cannot help her with this. I did tell her that her creatinine had normalized via the last lab work I could see done at [facility] in November and her estimated GFR was 53 at that time. By my estimation careful application of contrast  dye and [place], [name] ([id number]) preangiogram hydration should be able to get her through the necessary angiography with no complication however she simply does not want to hear this and she is going to put this off until she can get through the [place] system 2/24; 2-week follow-up wound surface area is about the same certainly not worse. She has 100% slough cover we have been using Santyl 3/3; 2-week follow-up. The patient's wound is essentially unchanged. The surface has slough on it no debridement it certainly does not look perfused. She is worried about the erythema she is having in her foot. She had a cramp in her leg last night but is not complaining of any other type of pain. Is also is concerned about some edema that is not well corrected by the diuretic she is on Electronic Signature(s) Signed: 07/13/2019 4:59:37 PM By: Linton Ham MD Entered By: Linton Ham on 07/13/2019 16:30:04 [place], [name] ([id number]) -------------------------------------------------------------------------------- Physical Exam Details Patient Name: [name] Date of Service: 07/13/2019 3:15 PM Medical Record Number: [id number] Patient Account Number: [id number] Date of Birth/Sex: 1931-01-18 (86 y.o. F) Treating RN: Cornell Barman Primary Care Provider: Emily Filbert Other Clinician: Referring Provider: Emily Filbert Treating Provider/Extender: Tito Dine in Treatment: 12 Constitutional Patient is hypertensive.. Pulse regular and within target range for patient.Marland Kitchen Respirations regular, non-labored and within target range.. Temperature is normal and within the target range for the patient.Marland Kitchen appears in no distress. Cardiovascular Pedal pulses in her foot are not palpable. There is some mild edema. Integumentary (Hair, Skin) Skin is erythematous in the dorsal foot bilaterally. I think this is a combination of arterial and venous insufficiency.. Notes Wound exam; the wound looks pale nondescript not a  viable surface. Electronic Signature(s) Signed: 07/13/2019 4:59:37 PM By: Linton Ham MD Entered By: Linton Ham on 07/13/2019 16:32:47 [place], [name] ([id number]) -------------------------------------------------------------------------------- Physician Orders Details Patient Name: [name] Date of Service: 07/13/2019 3:15 PM Medical Record Number: [id number] Patient Account Number: [id number] Date of Birth/Sex: 11-12-1930 (86 y.o. F) Treating RN: Cornell Barman Primary Care Provider: Emily Filbert Other Clinician: Referring Provider: Emily Filbert Treating Provider/Extender: Tito Dine in Treatment: 12 Verbal / Phone Orders: No Diagnosis Coding Wound Cleansing Wound #2 Right,Dorsal Foot o Clean wound with Normal Saline. Anesthetic (add to Medication List) Wound #2 Right,Dorsal Foot o Topical Lidocaine 4% cream applied to wound bed prior to debridement (In Clinic Only). Primary Wound Dressing Wound #2 Right,Dorsal Foot o Santyl Ointment Secondary Dressing Wound #2 Right,Dorsal Foot o Boardered Foam Dressing Dressing Change Frequency Wound #2 Right,Dorsal Foot o Change dressing every day. Follow-up Appointments Wound #2 Right,Dorsal Foot o Return Appointment in 2 weeks. Edema Control Wound #2 Right,Dorsal Foot o Elevate legs to the level of the heart and pump ankles as often as possible Medications-please add to medication list. Wound #2 Right,Dorsal Foot o Santyl Enzymatic Ointment Electronic Signature(s) Signed: 07/13/2019 4:40:39 PM By: Gretta Cool, BSN, RN, CWS, Kim RN, BSN  Signed: 07/13/2019 4:59:37 PM By: Linton Ham MD Entered By: Gretta Cool, BSN, RN, CWS, Kim on 07/13/2019 15:56:36 [place], [name] ([id number]) -------------------------------------------------------------------------------- Problem List Details Patient Name: [name] Date of Service: 07/13/2019 3:15 PM Medical Record Number: [id number] Patient Account Number: [id number] Date of  Birth/Sex: Mar 13, 1931 (86 y.o. F) Treating RN: Cornell Barman Primary Care Provider: Emily Filbert Other Clinician: Referring Provider: Emily Filbert Treating Provider/Extender: Tito Dine in Treatment: 12 Active Problems ICD-10 Evaluated Encounter Code Description Active Date Today Diagnosis E11.52 Type 2 diabetes mellitus with diabetic peripheral angiopathy with gangrene 04/20/2019 No Yes E11.42 Type 2 diabetes mellitus with diabetic polyneuropathy 04/20/2019 No Yes L97.528 Non-pressure chronic ulcer of other part of left foot with other specified 04/20/2019 No Yes severity E11.621 Type 2 diabetes mellitus with foot ulcer 04/20/2019 No Yes L97.518 Non-pressure chronic ulcer of other part of right foot with other specified 04/20/2019 No Yes severity Inactive Problems Resolved Problems Electronic Signature(s) Signed: 07/13/2019 4:59:37 PM By: Linton Ham MD Entered By: Linton Ham on 07/13/2019 16:27:03 [place], [name] ([id number]) -------------------------------------------------------------------------------- Progress Note Details Patient Name: [name] Date of Service: 07/13/2019 3:15 PM Medical Record Number: [id number] Patient Account Number: [id number] Date of Birth/Sex: [birth year] (86 y.o. F) Treating RN: Cornell Barman Primary Care Provider: Emily Filbert Other Clinician: Referring Provider: Emily Filbert Treating Provider/Extender: Tito Dine in Treatment: 12 Subjective History of Present Illness (HPI) ADMISSION 04/20/2019 This is an 86 year old woman who lives in the independent part of [place]. She is a type II diabetic on oral agents and insulin with a recent hemoglobin A1c of 6.3 on 10/6. She was initially seen by primary care on 11/12 after dropping a heavy walker on her bilateral dorsal feet. She had blisters on the right and left lateral foot. She was given doxycycline. She was seen the next day in [place] clinic on 11/13 continued on  the.C. Bilateral foot x-rays were negative. She was admitted to hospital from 11/17 through 11/27 with acute kidney injury I did not research this although the patient tells me that at discharge her kidney function had returned to normal. During the course of this hospitalization it was noted she had wounds on her bilateral feet and her peripheral pulses were nonpalpable. She was evaluated by Dr. Delana Meyer. It was recommended that she consider angiography on the right leg and it was unlikely that she would heal the right leg as her ABI here was 0.48 with monophasic waveforms on the left her ABI was 1.19 with biphasic waveforms. One would wonder if the ABI was artificially elevated on the left. In any case Dr. Cydney Ok note accurately describes the patient's current state of mind. She does not want to rush into an intervention and she is here for our review of her wounds. Noteworthy that when she arrived I think her renal insufficiency was felt to be prerenal. Her GFR on arrival was 22 now 36. Past medical history includes type 2 diabetes with peripheral neuropathy, atrial fibrillation, renal insufficiency We did not calculate ABIs in our clinic this was 0.48 on the right and 1.19 on the left as noted above. Monophasic waveforms on the right biphasic on the left 12/16; patient admitted to the clinic last week with trauma critical limb ischemia on the right. Today her wound on the left dorsal foot is healed. She still has the black eschar on the dorsal right foot that we have been using Santyl on. There is apparently some drainage. She arrives in clinic today with more swelling  on the right great and the left dorsal surrounding her foot wounds. Some erythema especially on the right. There is some tenderness on the right but not as much as I might expect if this was all infection nevertheless he does have some neuropathy which might be blunting discomfort. She reports no additional trauma. She is at the  rehab section of [facility], she lives in the independent part of [place]. She tells me she has some pain in the right leg at night which wakes her up from sleeping although she manages to get back to sleep. She has about 150 foot exercise tolerance and then gets fatigue in her legs. Again I discussed the issue of angiogram with the patient especially on the right. She just will not agree to this. She states she wants the rehab and then go to [facility] where a list of her specialists. She was on Levaquin last week we did not prescribe this but she is finished. She describes some nausea and loose stools but does not sound dramatically unwell with this Left dorsal foot had some very superficial areas today I think related to swelling. The big problem is on the dorsal right foot black surface eschar with increased swelling. She does not have any pain or tenderness although she has neuropathy. We have been using Santyl. She has PAD and again I think needs an angiogram. 12/30; 2-week follow-up. The areas on the right dorsal foot. Everything is healed on the left. She is still in the rehab part of [place]. She went to see Dr. Lucky Cowboy on 12/29. He reiterated to her he felt that she needed angiography on the right leg for critical limb ischemia. She seemed to have 2 major concerns that is wanting to see a nephrologist prior to procedure in consultation. She does not currently have a nephrologist. She also had no confidence in the original arterial studies that were done in the hospital and wanted them redone. I do not think Dr. Lucky Cowboy felt that they needed to be redone and wanted to proceed with angiography. At the end of it he simply stated that he would be glad to do the angiogram at any time the patient agreed to proceed I looked briefly at her kidney function which had normalized by the time she left the hospital in late November. I do not know that the patient has any additional concern at this point other  than the reduction in GFR associated with her diabetes and normal aging. 1/27; I have not seen this patient in almost 4 weeks. She was apparently isolated in the [facility] facility because of COVID-19. She tells me she was mildly symptomatic but feels better now although she still feels like she is in a "fog" as far as her mental abilities. She states that she is up and walking for short distances with a walker. She would like to go back to her independent apartment. Still using Santyl to the wound The patient is not describing a lot of pain. She does not describe claudication but her activity sounds very limited especially when she was under isolation. She is still reluctant to go forward with the angiogram that was suggested. Currently she wants to recover fully from Covid infection 2/10; 2-week follow-up. Patient arrives with 100% nonviable surface over the wound on her right dorsal foot. I changed her to [facility] from [place] last week in terms of surface the wound is deteriorated although I think the surface area is about the  same. She is not describing claudication walking about 50 feet The patient tells me she is trying to get in with her primary doctor however there is restrictions with regards to the pandemic. She wants to be referred to nephrology and then to vascular surgery at [facility] I cannot help her with this. I did tell her that her creatinine had normalized via the last lab work I could see done at [facility] in November and her estimated GFR was 53 at that time. By my estimation careful application of contrast dye and [place], [name] ([id number]) preangiogram hydration should be able to get her through the necessary angiography with no complication however she simply does not want to hear this and she is going to put this off until she can get through the [place] system 2/24; 2-week follow-up wound surface area is about the same certainly not worse. She has 100% slough cover we have been using  Santyl 3/3; 2-week follow-up. The patient's wound is essentially unchanged. The surface has slough on it no debridement it certainly does not look perfused. She is worried about the erythema she is having in her foot. She had a cramp in her leg last night but is not complaining of any other type of pain. Is also is concerned about some edema that is not well corrected by the diuretic she is on Objective Constitutional Patient is hypertensive.. Pulse regular and within target range for patient.Marland Kitchen Respirations regular, non-labored and within target range.. Temperature is normal and within the target range for the patient.Marland Kitchen appears in no distress. Vitals Time Taken: 3:14 PM, Height: 69 in, Weight: 195 lbs, BMI: 28.8, Temperature: 98.7 F, Pulse: 65 bpm, Respiratory Rate: 15 breaths/min, Blood Pressure: 185/76 mmHg. Cardiovascular Pedal pulses in her foot are not palpable. There is some mild edema. General Notes: Wound exam; the wound looks pale nondescript not a viable surface. Integumentary (Hair, Skin) Skin is erythematous in the dorsal foot bilaterally. I think this is a combination of arterial and venous insufficiency.. Wound #2 status is Open. Original cause of wound was Trauma. The wound is located on the Right,Dorsal Foot. The wound measures 4cm length x 3.5cm width x 0.2cm depth; 10.996cm^2 area and 2.199cm^3 volume. There is Fat Layer (Subcutaneous Tissue) Exposed exposed. There is no tunneling or undermining noted. There is a medium amount of serosanguineous drainage noted. The wound margin is thickened. There is small (1-33%) red granulation within the wound bed. There is a large (67-100%) amount of necrotic tissue within the wound bed including Adherent Slough. Assessment Active Problems ICD-10 Type 2 diabetes mellitus with diabetic peripheral angiopathy with gangrene Type 2 diabetes mellitus with diabetic polyneuropathy Non-pressure chronic ulcer of other part of left foot with  other specified severity Type 2 diabetes mellitus with foot ulcer Non-pressure chronic ulcer of other part of right foot with other specified severity Procedures Wound #2 Pre-procedure diagnosis of Wound #2 is a Cellulitis located on the Right,Dorsal Foot . There was a Chemical/Enzymatic/Mechanical debridement performed by Ricard Dillon, MD. With the following instrument(s): tongue blade after achieving pain control using Lidocaine. Agent used was [employer]. A time out was conducted at 15:57, prior to the start of the procedure. There was no bleeding. The procedure was tolerated well. Post Debridement Measurements: 4cm length x 3.5cm width x 0.2cm depth; 2.199cm^3 volume. Character of Wound/Ulcer Post Debridement is stable. Post procedure Diagnosis Wound #2: Same as Pre-Procedure [name], [name] ([id number]) Plan Wound Cleansing: Wound #2 Right,Dorsal Foot: Clean wound with Normal Saline. Anesthetic (add  to Medication List): Wound #2 Right,Dorsal Foot: Topical Lidocaine 4% cream applied to wound bed prior to debridement (In Clinic Only). Primary Wound Dressing: Wound #2 Right,Dorsal Foot: Santyl Ointment Secondary Dressing: Wound #2 Right,Dorsal Foot: Boardered Foam Dressing Dressing Change Frequency: Wound #2 Right,Dorsal Foot: Change dressing every day. Follow-up Appointments: Wound #2 Right,Dorsal Foot: Return Appointment in 2 weeks. Edema Control: Wound #2 Right,Dorsal Foot: Elevate legs to the level of the heart and pump ankles as often as possible Medications-please add to medication list.: Wound #2 Right,Dorsal Foot: Santyl Enzymatic Ointment 1. I am going to continue with Santyl ointment. No further debridement 2. She finally was able to see her doctor yesterday who promised to referral to vascular at [facility]. I have warned her that this can take too long and that I am increasingly concerned about the degree of ischemia in her foot although I do not think the wound is changed  all that much. 3. The color change in her skin I think is a combination of arterial and venous insufficiency more vibrant when her legs are dependent. By itself I do not think this is ominous. She does not require antibiotics. There is no tenderness. 4. I outlined a timetable for her if she does not have an appointment in 2 weeks I would ask her to go back to see Dr. Lucky Cowboy or Dr. Delana Meyer either 1 of which I have complete confidence in Electronic Signature(s) Signed: 07/13/2019 4:59:37 PM By: Linton Ham MD Entered By: Linton Ham on 07/13/2019 16:34:40 Ohern, [name] ([id number]) -------------------------------------------------------------------------------- SuperBill Details Patient Name: [name] Date of Service: 07/13/2019 Medical Record Number: [id number] Patient Account Number: [id number] Date of Birth/Sex: April 21, 1931 (86 y.o. 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 E11.621 Type 2 diabetes mellitus with foot ulcer L97.518 Non-pressure chronic ulcer of other part of right foot with other specified severity Facility Procedures CPT4 Code: [id number] Description: [id number] - DEBRIDE W/O ANES NON SELECT Modifier: Quantity: 1 Physician Procedures CPT4 Code: [id number] Description: [id number] - WC PHYS LEVEL 3 - EST PT Modifier: Quantity: 1 CPT4 Code: Description: ICD-10 Diagnosis Description 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

## 2019-07-14 NOTE — Progress Notes (Signed)
[place], [place] ([id number]) Visit Report for 07/13/2019 Arrival Information Details Patient Name: Vannessa, Neeser [address] [facility] Date of Service: 07/13/2019 3:15 PM Medical Record Number: [id number] Patient Account Number: [id number] Date of Birth/Sex: Oct 13, 1930 (86 y.o. F) Treating RN: Army Melia Primary Care Desmond Szabo: Emily Filbert Other Clinician: Referring Solana Coggin: Emily Filbert Treating Waunita Sandstrom/Extender: Tito Dine in Treatment: 12 Visit Information History Since Last Visit Added or deleted any medications: No Patient Arrived: Walker Any new allergies or adverse reactions: No Arrival Time: 15:14 Had a fall or experienced change in No Accompanied By: self activities of daily living that may affect Transfer Assistance: None risk of falls: Patient Identification Verified: Yes Signs or symptoms of abuse/neglect since last visito No Patient Has Alerts: Yes Hospitalized since last visit: No Patient Alerts: Patient on Blood Thinner Has Dressing in Place as Prescribed: Yes ABI L 1.19 R 0.48 03/2019 Pain Present Now: No 2-325mg  Aspirin daily Electronic Signature(s) Signed: 07/14/2019 8:24:34 AM By: Army Melia Entered By: Army Melia on 07/13/2019 15:14:35 [place], Trainer ([id number]) -------------------------------------------------------------------------------- Encounter Discharge Information Details Patient Name: [name] Date of Service: 07/13/2019 3:15 PM Medical Record Number: [id number] Patient Account Number: [id number] Date of Birth/Sex: 11/14/1930 (86 y.o. F) Treating RN: Cornell Barman Primary Care Akiah Bauch: Emily Filbert Other Clinician: Referring Scotlyn Mccranie: Emily Filbert Treating Khadijah Mastrianni/Extender: Tito Dine in Treatment: 12 Encounter Discharge Information Items Post Procedure Vitals Discharge Condition: Stable Temperature (F): 98.7 Ambulatory Status: Ambulatory Pulse (bpm): 65 Discharge Destination: Home Respiratory Rate (breaths/min): 16 Transportation:  Private Auto Blood Pressure (mmHg): 185/76 Accompanied By: self Schedule Follow-up Appointment: Yes Clinical Summary of Care: Electronic Signature(s) Signed: 07/13/2019 4:40:39 PM By: Gretta Cool, BSN, RN, CWS, Kim RN, BSN Entered By: Gretta Cool, BSN, RN, CWS, Kim on 07/13/2019 15:59:33 [name], Lasharn [phone]) -------------------------------------------------------------------------------- Lower Extremity Assessment Details Patient Name: [name] Date of Service: 07/13/2019 3:15 PM Medical Record Number: [id number] Patient Account Number: [id number] Date of Birth/Sex: [birth year] (86 y.o. F) Treating RN: Army Melia Primary Care Marshaun Lortie: Emily Filbert Other Clinician: Referring Montrice Gracey: Emily Filbert Treating Jaelynn Currier/Extender: Ricard Dillon Weeks in Treatment: 12 Edema Assessment Assessed: [Left: No] [Right: No] Edema: [Left: N] [Right: o] Vascular Assessment Pulses: Dorsalis Pedis Palpable: [Right:Yes] Electronic Signature(s) Signed: 07/14/2019 8:24:34 AM By: Army Melia Entered By: Army Melia on 07/13/2019 15:19:26 Staples, Julya ([id number]) -------------------------------------------------------------------------------- Multi Wound Chart Details Patient Name: [name] Date of Service: 07/13/2019 3:15 PM Medical Record Number: [id number] Patient Account Number: [id number] Date of Birth/Sex: 23-Nov-1930 (86 y.o. F) Treating RN: Cornell Barman Primary Care Kailynn Satterly: Emily Filbert Other Clinician: Referring Domenick Quebedeaux: Emily Filbert Treating Royce Stegman/Extender: Tito Dine in Treatment: 12 Vital Signs Height(in): 33 Pulse(bpm): 37 Weight(lbs): 195 Blood Pressure(mmHg): 185/76 Body Mass Index(BMI): 29 Temperature(F): 98.7 Respiratory Rate(breaths/min): 15 Photos: [N/A:N/A] Wound Location: Right Foot - Dorsal N/A N/A Wounding Event: Trauma N/A N/A Primary Etiology: Cellulitis N/A N/A Comorbid History: Cataracts, Arrhythmia, Congestive N/A N/A Heart Failure,  Hypertension, Type II Diabetes, Rheumatoid Arthritis Date Acquired: 03/24/2019 N/A N/A Weeks of Treatment: 12 N/A N/A Wound Status: Open N/A N/A Measurements L x W x D (cm) 4x3.5x0.2 N/A N/A Area (cm) : 10.996 N/A N/A Volume (cm) : 2.199 N/A N/A % Reduction in Area: -66.70% N/A N/A % Reduction in Volume: -233.20% N/A N/A Classification: Full Thickness Without Exposed N/A N/A Support Structures Exudate Amount: Medium N/A N/A Exudate Type: Serosanguineous N/A N/A Exudate Color: red, brown N/A N/A Wound Margin: Thickened N/A N/A Granulation Amount: Small (1-33%) N/A N/A Granulation Quality: Red N/A N/A Necrotic Amount: Large (67-100%)  N/A N/A Exposed Structures: Fat Layer (Subcutaneous Tissue) N/A N/A Exposed: Yes Fascia: No Tendon: No Muscle: No Joint: No Bone: No Epithelialization: None N/A N/A Debridement: Chemical/Enzymatic/Mechanical N/A N/A Pre-procedure Verification/Time 15:57 N/A N/A Out Taken: Pain Control: Lidocaine N/A N/A Instrument: Other(tongue blade) N/A N/A Bleeding: None N/A N/A Procedure was tolerated well N/A N/A Fluhr, Haydan ([id number]) Debridement Treatment Response: Post Debridement Measurements 4x3.5x0.2 N/A N/A L x W x D (cm) Post Debridement Volume: (cm) 2.199 N/A N/A Procedures Performed: Debridement N/A N/A Treatment Notes Wound #2 (Right, Dorsal Foot) Notes Santyl, Bordered Foam dressing to secure Electronic Signature(s) Signed: 07/13/2019 4:59:37 PM By: Linton Ham MD Entered By: Linton Ham on 07/13/2019 16:27:25 Putzier, Adelae ([id number]) -------------------------------------------------------------------------------- Multi-Disciplinary Care Plan Details Patient Name: [name] Date of Service: 07/13/2019 3:15 PM Medical Record Number: [id number] Patient Account Number: [id number] Date of Birth/Sex: Apr 14, 1931 (86 y.o. F) Treating RN: Cornell Barman Primary Care Ione Sandusky: Emily Filbert Other Clinician: Referring Undray Allman: Emily Filbert Treating Alanson Hausmann/Extender: Tito Dine in Treatment: 12 Active Inactive Necrotic Tissue Nursing Diagnoses: Impaired tissue integrity related to necrotic/devitalized tissue Knowledge deficit related to management of necrotic/devitalized tissue Goals: Necrotic/devitalized tissue will be minimized in the wound bed Date Initiated: 04/20/2019 Target Resolution Date: 05/04/2019 Goal Status: Active Interventions: Assess patient pain level pre-, during and post procedure and prior to discharge Provide education on necrotic tissue and debridement process Treatment Activities: Apply topical anesthetic as ordered : 04/20/2019 Notes: Orientation to the Wound Care Program Nursing Diagnoses: Knowledge deficit related to the wound healing center program Goals: Patient/caregiver will verbalize understanding of the [place] Date Initiated: 04/20/2019 Target Resolution Date: 05/04/2019 Goal Status: Active Interventions: Provide education on orientation to the wound center Notes: Venous Leg Ulcer Nursing Diagnoses: Actual venous Insuffiency (use after diagnosis is confirmed) Goals: Patient/caregiver will verbalize understanding of disease process and disease management Date Initiated: 04/20/2019 Target Resolution Date: 05/04/2019 Goal Status: Active Interventions: Assess peripheral edema status every visit. NotesLangley Vandermeulen, Quamesha ([id number]) Wound/Skin Impairment Nursing Diagnoses: Impaired tissue integrity Goals: Patient/caregiver will verbalize understanding of skin care regimen Date Initiated: 04/20/2019 Target Resolution Date: 05/04/2019 Goal Status: Active Ulcer/skin breakdown will have a volume reduction of 30% by week 4 Date Initiated: 04/20/2019 Target Resolution Date: 05/18/2019 Goal Status: Active Interventions: Assess ulceration(s) every visit Treatment Activities: Skin care regimen initiated : 04/20/2019 Topical wound management initiated :  04/20/2019 Notes: Electronic Signature(s) Signed: 07/13/2019 4:40:39 PM By: Gretta Cool, BSN, RN, CWS, Kim RN, BSN Entered By: Gretta Cool, BSN, RN, CWS, Kim on 07/13/2019 15:56:06 [name], Stina ([id number]) -------------------------------------------------------------------------------- Pain Assessment Details Patient Name: [name] Date of Service: 07/13/2019 3:15 PM Medical Record Number: [id number] Patient Account Number: [id number] Date of Birth/Sex: 28-Sep-1930 (86 y.o. F) Treating RN: Army Melia Primary Care Dessie Tatem: Emily Filbert Other Clinician: Referring Anja Neuzil: Emily Filbert Treating Dionisia Pacholski/Extender: Tito Dine in Treatment: 12 Active Problems Location of Pain Severity and Description of Pain Patient Has Paino No Site Locations Pain Management and Medication Current Pain Management: Electronic Signature(s) Signed: 07/14/2019 8:24:34 AM By: Army Melia Entered By: Army Melia on 07/13/2019 15:15:04 Domingo, Camora ([id number]) -------------------------------------------------------------------------------- Patient/Caregiver Education Details Patient Name: [name] Date of Service: 07/13/2019 3:15 PM Medical Record Number: [id number] Patient Account Number: [id number] Date of Birth/Gender: [birth year] (86 y.o. F) Treating RN: Cornell Barman Primary Care Physician: Emily Filbert Other Clinician: Referring Physician: Emily Filbert Treating Physician/Extender: Tito Dine in Treatment: 12 Education Assessment Education Provided To: Patient Education Topics Provided Wound Debridement: Handouts: Wound Debridement  Methods: Demonstration, Explain/Verbal Responses: State content correctly Wound/Skin Impairment: Handouts: Caring for Your Ulcer Methods: Demonstration, Explain/Verbal Responses: State content correctly Electronic Signature(s) Signed: 07/13/2019 4:40:39 PM By: Gretta Cool, BSN, RN, CWS, Kim RN, BSN Entered By: Gretta Cool, BSN, RN, CWS, Kim on 07/13/2019  15:58:44 [name], Evone ([id number]) -------------------------------------------------------------------------------- Wound Assessment Details Patient Name: [name] Date of Service: 07/13/2019 3:15 PM Medical Record Number: [id number] Patient Account Number: [id number] Date of Birth/Sex: [birth year] (86 y.o. F) Treating RN: Army Melia Primary Care Donevan Biller: Emily Filbert Other Clinician: Referring Jlen Wintle: Emily Filbert Treating Soniya Ashraf/Extender: Tito Dine in Treatment: 12 Wound Status Wound Number: 2 Primary Cellulitis Etiology: Wound Location: Right Foot - Dorsal Wound Open Wounding Event: Trauma Status: Date Acquired: 03/24/2019 Comorbid Cataracts, Arrhythmia, Congestive Heart Failure, Weeks Of Treatment: 12 History: Hypertension, Type II Diabetes, Rheumatoid Arthritis Clustered Wound: No Photos Wound Measurements Length: (cm) 4 Width: (cm) 3.5 Depth: (cm) 0.2 Area: (cm) 10.996 Volume: (cm) 2.199 % Reduction in Area: -66.7% % Reduction in Volume: -233.2% Epithelialization: None Tunneling: No Undermining: No Wound Description Classification: Full Thickness Without Exposed Support Structures Wound Margin: Thickened Exudate Amount: Medium Exudate Type: Serosanguineous Exudate Color: red, brown Foul Odor After Cleansing: No Slough/Fibrino Yes Wound Bed Granulation Amount: Small (1-33%) Exposed Structure Granulation Quality: Red Fascia Exposed: No Necrotic Amount: Large (67-100%) Fat Layer (Subcutaneous Tissue) Exposed: Yes Necrotic Quality: Adherent Slough Tendon Exposed: No Muscle Exposed: No Joint Exposed: No Bone Exposed: No Treatment Notes Wound #2 (Right, Dorsal Foot) Notes Santyl, Bordered Foam dressing to secure Electronic Signature(s) Signed: 07/14/2019 8:24:34 AM By: Collene Gobble, Laurey ([id number]) Entered By: Army Melia on 07/13/2019 15:18:14 Winborne, Taylon  ([id number]) -------------------------------------------------------------------------------- Vitals Details Patient Name: [name] Date of Service: 07/13/2019 3:15 PM Medical Record Number: [id number] Patient Account Number: [id number] Date of Birth/Sex: 27-Aug-1930 (86 y.o. F) Treating RN: Army Melia Primary Care Elycia Woodside: Emily Filbert Other Clinician: Referring Marieelena Bartko: Emily Filbert Treating Lemoine Goyne/Extender: Tito Dine in Treatment: 12 Vital Signs Time Taken: 15:14 Temperature (F): 98.7 Height (in): 69 Pulse (bpm): 65 Weight (lbs): 195 Respiratory Rate (breaths/min): 15 Body Mass Index (BMI): 28.8 Blood Pressure (mmHg): 185/76 Reference Range: 80 - 120 mg / dl Electronic Signature(s) Signed: 07/14/2019 8:24:34 AM By: Army Melia Entered By: Army Melia on 07/13/2019 15:14:59

## 2019-07-15 NOTE — Progress Notes (Signed)
[place], Beula ([id number]) Visit Report for 07/06/2019 Debridement Details Patient Name: [name], [name] [facility] Date of Service: 07/06/2019 11:00 AM Medical Record Number: [id number] Patient Account Number: [id number] Date of Birth/Sex: 1931/05/04 (86 y.o. F) Treating RN: Cornell Barman Primary Care Provider: Emily Filbert Other Clinician: Referring Provider: Emily Filbert Treating Provider/Extender: Tito Dine in Treatment: 11 Debridement Performed for Wound #2 Right,Dorsal Foot Assessment: Performed By: Physician Ricard Dillon, MD Debridement Type: Debridement Level of Consciousness (Pre- Awake and Alert procedure): Pre-procedure Verification/Time Out Yes - 11:31 Taken: Start Time: 11:31 Pain Control: Lidocaine Total Area Debrided (L x W): 4 (cm) x 3.5 (cm) = 14 (cm) Tissue and other material debrided: Non-Viable, Slough, Slough Level: Non-Viable Tissue Debridement Description: Selective/Open Wound Instrument: Curette Bleeding: Minimum Hemostasis Achieved: Pressure End Time: 11:32 Response to Treatment: Procedure was tolerated well Level of Consciousness (Post- Awake and Alert procedure): Post Debridement Measurements of Total Wound Length: (cm) 4 Width: (cm) 3.5 Depth: (cm) 0.2 Volume: (cm) 2.199 Character of Wound/Ulcer Post Debridement: Stable Post Procedure Diagnosis Same as Pre-procedure Electronic Signature(s) Signed: 07/06/2019 5:01:35 PM By: Linton Ham MD Signed: 07/15/2019 5:40:44 PM By: Gretta Cool, BSN, RN, CWS, Kim RN, BSN Entered By: Linton Ham on 07/06/2019 11:37:50 [name], [name] ([id number]) -------------------------------------------------------------------------------- HPI Details Patient Name: [name] Date of Service: 07/06/2019 11:00 AM Medical Record Number: [id number] Patient Account Number: [id number] Date of Birth/Sex: 1931-04-07 (86 y.o. F) Treating RN: Cornell Barman Primary Care Provider: Emily Filbert Other Clinician: Referring Provider:  Emily Filbert Treating Provider/Extender: Tito Dine in Treatment: 11 History of Present Illness HPI Description: ADMISSION 04/20/2019 This is an 86 year old woman who lives in the independent part of [place]. She is a type II diabetic on oral agents and insulin with a recent hemoglobin A1c of 6.3 on 10/6. She was initially seen by primary care on 11/12 after dropping a heavy walker on her bilateral dorsal feet. She had blisters on the right and left lateral foot. She was given doxycycline. She was seen the next day in [place] clinic on 11/13 continued on the.C. Bilateral foot x-rays were negative. She was admitted to hospital from 11/17 through 11/27 with acute kidney injury I did not research this although the patient tells me that at discharge her kidney function had returned to normal. During the course of this hospitalization it was noted she had wounds on her bilateral feet and her peripheral pulses were nonpalpable. She was evaluated by Dr. Delana Meyer. It was recommended that she consider angiography on the right leg and it was unlikely that she would heal the right leg as her ABI here was 0.48 with monophasic waveforms on the left her ABI was 1.19 with biphasic waveforms. One would wonder if the ABI was artificially elevated on the left. In any case Dr. Cydney Ok note accurately describes the patient's current state of mind. She does not want to rush into an intervention and she is here for our review of her wounds. Noteworthy that when she arrived I think her renal insufficiency was felt to be prerenal. Her GFR on arrival was 22 now 36. Past medical history includes type 2 diabetes with peripheral neuropathy, atrial fibrillation, renal insufficiency We did not calculate ABIs in our clinic this was 0.48 on the right and 1.19 on the left as noted above. Monophasic waveforms on the right biphasic on the left 12/16; patient admitted to the clinic last week with trauma critical  limb ischemia on the right. Today her wound on the left dorsal foot  is healed. She still has the black eschar on the dorsal right foot that we have been using Santyl on. There is apparently some drainage. She arrives in clinic today with more swelling on the right great and the left dorsal surrounding her foot wounds. Some erythema especially on the right. There is some tenderness on the right but not as much as I might expect if this was all infection nevertheless he does have some neuropathy which might be blunting discomfort. She reports no additional trauma. She is at the rehab section of [facility], she lives in the independent part of [place]. She tells me she has some pain in the right leg at night which wakes her up from sleeping although she manages to get back to sleep. She has about 150 foot exercise tolerance and then gets fatigue in her legs. Again I discussed the issue of angiogram with the patient especially on the right. She just will not agree to this. She states she wants the rehab and then go to [facility] where a list of her specialists. She was on Levaquin last week we did not prescribe this but she is finished. She describes some nausea and loose stools but does not sound dramatically unwell with this Left dorsal foot had some very superficial areas today I think related to swelling. The big problem is on the dorsal right foot black surface eschar with increased swelling. She does not have any pain or tenderness although she has neuropathy. We have been using Santyl. She has PAD and again I think needs an angiogram. 12/30; 2-week follow-up. The areas on the right dorsal foot. Everything is healed on the left. She is still in the rehab part of [place]. She went to see Dr. Lucky Cowboy on 12/29. He reiterated to her he felt that she needed angiography on the right leg for critical limb ischemia. She seemed to have 2 major concerns that is wanting to see a nephrologist prior to procedure in  consultation. She does not currently have a nephrologist. She also had no confidence in the original arterial studies that were done in the hospital and wanted them redone. I do not think Dr. Lucky Cowboy felt that they needed to be redone and wanted to proceed with angiography. At the end of it he simply stated that he would be glad to do the angiogram at any time the patient agreed to proceed I looked briefly at her kidney function which had normalized by the time she left the hospital in late November. I do not know that the patient has any additional concern at this point other than the reduction in GFR associated with her diabetes and normal aging. 1/27; I have not seen this patient in almost 4 weeks. She was apparently isolated in the [facility] facility because of COVID-19. She tells me she was mildly symptomatic but feels better now although she still feels like she is in a "fog" as far as her mental abilities. She states that she is up and walking for short distances with a walker. She would like to go back to her independent apartment. Still using Santyl to the wound The patient is not describing a lot of pain. She does not describe claudication but her activity sounds very limited especially when she was under isolation. She is still reluctant to go forward with the angiogram that was suggested. Currently she wants to recover fully from Covid infection 2/10; 2-week follow-up. Patient arrives with 100% nonviable surface over the wound  on her right dorsal foot. I changed her to [facility] from [place] last week in terms of surface the wound is deteriorated although I think the surface area is about the same. She is not describing claudication walking about 50 feet The patient tells me she is trying to get in with her primary doctor however there is restrictions with regards to the pandemic. She wants to be referred to nephrology and then to vascular surgery at [facility] I cannot help her with this. I  did tell her that her creatinine had normalized via the last lab work I could see done at [facility] in November and her estimated GFR was 53 at that time. By my estimation careful application of contrast dye and [name], [name] ([id number]) preangiogram hydration should be able to get her through the necessary angiography with no complication however she simply does not want to hear this and she is going to put this off until she can get through the [place] system 2/24; 2-week follow-up wound surface area is about the same certainly not worse. She has 100% slough cover we have been using Environmental health practitioner) Signed: 07/06/2019 5:01:35 PM By: Linton Ham MD Entered By: Linton Ham on 07/06/2019 11:39:21 [name], [name] ([id number]) -------------------------------------------------------------------------------- Physical Exam Details Patient Name: [name] Date of Service: 07/06/2019 11:00 AM Medical Record Number: [id number] Patient Account Number: [id number] Date of Birth/Sex: [birth year] (86 y.o. F) Treating RN: Cornell Barman Primary Care Provider: Emily Filbert Other Clinician: Referring Provider: Emily Filbert Treating Provider/Extender: Tito Dine in Treatment: 11 Constitutional Patient is hypertensive.. Pulse regular and within target range for patient.Marland Kitchen Respirations regular, non-labored and within target range.. Temperature is normal and within the target range for the patient.Marland Kitchen appears in no distress. Cardiovascular Pedal pulses are nonpalpable on the right. Notes Wound exam; 100% slough covered I used an open curette to remove the slough the wound bed looks stable but pale. No deeper debridement will be entertained until we can address her vascular insufficiency there is no evidence of surrounding infection Electronic Signature(s) Signed: 07/06/2019 5:01:35 PM By: Linton Ham MD Entered By: Linton Ham on 07/06/2019 11:41:43 [name], [name]  ([id number]) -------------------------------------------------------------------------------- Physician Orders Details Patient Name: [name] Date of Service: 07/06/2019 11:00 AM Medical Record Number: [id number] Patient Account Number: [id number] Date of Birth/Sex: 10/25/1930 (86 y.o. F) Treating RN: Cornell Barman Primary Care Provider: Emily Filbert Other Clinician: Referring Provider: Emily Filbert Treating Provider/Extender: Tito Dine in Treatment: 50 Verbal / Phone Orders: No Diagnosis Coding Wound Cleansing Wound #2 Right,Dorsal Foot o Clean wound with Normal Saline. Anesthetic (add to Medication List) Wound #2 Right,Dorsal Foot o Topical Lidocaine 4% cream applied to wound bed prior to debridement (In Clinic Only). Primary Wound Dressing Wound #2 Right,Dorsal Foot o Santyl Ointment Secondary Dressing Wound #2 Right,Dorsal Foot o Boardered Foam Dressing Dressing Change Frequency Wound #2 Right,Dorsal Foot o Change dressing every day. Follow-up Appointments Wound #2 Right,Dorsal Foot o Return Appointment in 2 weeks. Edema Control Wound #2 Right,Dorsal Foot o Elevate legs to the level of the heart and pump ankles as often as possible Medications-please add to medication list. Wound #2 Right,Dorsal Foot o Santyl Enzymatic Ointment Patient Medications Allergies: No Known Drug Allergies Notifications Medication Indication Start End Santyl 07/06/2019 DOSE topical 250 unit/gram ointment - ointment topical to wound change daily (continuing rx) Electronic Signature(s) Signed: 07/06/2019 11:45:16 AM By: Linton Ham MD Entered By: Linton Ham on 07/06/2019 11:45:15 [name], [name] ([id number]) -------------------------------------------------------------------------------- Problem List Details Patient Name: [name],  [name] Date of Service: 07/06/2019 11:00 AM Medical Record Number: [id number] Patient Account Number: [id number] Date of Birth/Sex:  1931-05-02 (86 y.o. F) Treating RN: Cornell Barman Primary Care Provider: Emily Filbert Other Clinician: Referring Provider: Emily Filbert Treating Provider/Extender: Tito Dine in Treatment: 11 Active Problems ICD-10 Evaluated Encounter Code Description Active Date Today Diagnosis E11.52 Type 2 diabetes mellitus with diabetic peripheral angiopathy with gangrene 04/20/2019 No Yes E11.42 Type 2 diabetes mellitus with diabetic polyneuropathy 04/20/2019 No Yes L97.528 Non-pressure chronic ulcer of other part of left foot with other specified 04/20/2019 No Yes severity E11.621 Type 2 diabetes mellitus with foot ulcer 04/20/2019 No Yes L97.518 Non-pressure chronic ulcer of other part of right foot with other specified 04/20/2019 No Yes severity Inactive Problems Resolved Problems Electronic Signature(s) Signed: 07/06/2019 5:01:35 PM By: Linton Ham MD Entered By: Linton Ham on 07/06/2019 11:37:28 [name], [name] ([id number]) -------------------------------------------------------------------------------- Progress Note Details Patient Name: [name] Date of Service: 07/06/2019 11:00 AM Medical Record Number: [id number] Patient Account Number: [id number] Date of Birth/Sex: [birth year] (86 y.o. F) Treating RN: Cornell Barman Primary Care Provider: Emily Filbert Other Clinician: Referring Provider: Emily Filbert Treating Provider/Extender: Tito Dine in Treatment: 11 Subjective History of Present Illness (HPI) ADMISSION 04/20/2019 This is an 86 year old woman who lives in the independent part of [place]. She is a type II diabetic on oral agents and insulin with a recent hemoglobin A1c of 6.3 on 10/6. She was initially seen by primary care on 11/12 after dropping a heavy walker on her bilateral dorsal feet. She had blisters on the right and left lateral foot. She was given doxycycline. She was seen the next day in [place] clinic on 11/13 continued on the.C. Bilateral  foot x-rays were negative. She was admitted to hospital from 11/17 through 11/27 with acute kidney injury I did not research this although the patient tells me that at discharge her kidney function had returned to normal. During the course of this hospitalization it was noted she had wounds on her bilateral feet and her peripheral pulses were nonpalpable. She was evaluated by Dr. Delana Meyer. It was recommended that she consider angiography on the right leg and it was unlikely that she would heal the right leg as her ABI here was 0.48 with monophasic waveforms on the left her ABI was 1.19 with biphasic waveforms. One would wonder if the ABI was artificially elevated on the left. In any case Dr. Cydney Ok note accurately describes the patient's current state of mind. She does not want to rush into an intervention and she is here for our review of her wounds. Noteworthy that when she arrived I think her renal insufficiency was felt to be prerenal. Her GFR on arrival was 22 now 36. Past medical history includes type 2 diabetes with peripheral neuropathy, atrial fibrillation, renal insufficiency We did not calculate ABIs in our clinic this was 0.48 on the right and 1.19 on the left as noted above. Monophasic waveforms on the right biphasic on the left 12/16; patient admitted to the clinic last week with trauma critical limb ischemia on the right. Today her wound on the left dorsal foot is healed. She still has the black eschar on the dorsal right foot that we have been using Santyl on. There is apparently some drainage. She arrives in clinic today with more swelling on the right great and the left dorsal surrounding her foot wounds. Some erythema especially on the right. There is some tenderness on the right but not as  much as I might expect if this was all infection nevertheless he does have some neuropathy which might be blunting discomfort. She reports no additional trauma. She is at the rehab section of  [facility], she lives in the independent part of [place]. She tells me she has some pain in the right leg at night which wakes her up from sleeping although she manages to get back to sleep. She has about 150 foot exercise tolerance and then gets fatigue in her legs. Again I discussed the issue of angiogram with the patient especially on the right. She just will not agree to this. She states she wants the rehab and then go to [facility] where a list of her specialists. She was on Levaquin last week we did not prescribe this but she is finished. She describes some nausea and loose stools but does not sound dramatically unwell with this Left dorsal foot had some very superficial areas today I think related to swelling. The big problem is on the dorsal right foot black surface eschar with increased swelling. She does not have any pain or tenderness although she has neuropathy. We have been using Santyl. She has PAD and again I think needs an angiogram. 12/30; 2-week follow-up. The areas on the right dorsal foot. Everything is healed on the left. She is still in the rehab part of [place]. She went to see Dr. Lucky Cowboy on 12/29. He reiterated to her he felt that she needed angiography on the right leg for critical limb ischemia. She seemed to have 2 major concerns that is wanting to see a nephrologist prior to procedure in consultation. She does not currently have a nephrologist. She also had no confidence in the original arterial studies that were done in the hospital and wanted them redone. I do not think Dr. Lucky Cowboy felt that they needed to be redone and wanted to proceed with angiography. At the end of it he simply stated that he would be glad to do the angiogram at any time the patient agreed to proceed I looked briefly at her kidney function which had normalized by the time she left the hospital in late November. I do not know that the patient has any additional concern at this point other than the reduction  in GFR associated with her diabetes and normal aging. 1/27; I have not seen this patient in almost 4 weeks. She was apparently isolated in the [facility] facility because of COVID-19. She tells me she was mildly symptomatic but feels better now although she still feels like she is in a "fog" as far as her mental abilities. She states that she is up and walking for short distances with a walker. She would like to go back to her independent apartment. Still using Santyl to the wound The patient is not describing a lot of pain. She does not describe claudication but her activity sounds very limited especially when she was under isolation. She is still reluctant to go forward with the angiogram that was suggested. Currently she wants to recover fully from Covid infection 2/10; 2-week follow-up. Patient arrives with 100% nonviable surface over the wound on her right dorsal foot. I changed her to [facility] from [place] last week in terms of surface the wound is deteriorated although I think the surface area is about the same. She is not describing claudication walking about 50 feet The patient tells me she is trying to get in with her primary doctor however there is restrictions  with regards to the pandemic. She wants to be referred to nephrology and then to vascular surgery at [facility] I cannot help her with this. I did tell her that her creatinine had normalized via the last lab work I could see done at [facility] in November and her estimated GFR was 53 at that time. By my estimation careful application of contrast dye and [name], [name] ([id number]) preangiogram hydration should be able to get her through the necessary angiography with no complication however she simply does not want to hear this and she is going to put this off until she can get through the [place] system 2/24; 2-week follow-up wound surface area is about the same certainly not worse. She has 100% slough cover we have been using  Santyl Objective Constitutional Patient is hypertensive.. Pulse regular and within target range for patient.Marland Kitchen Respirations regular, non-labored and within target range.. Temperature is normal and within the target range for the patient.Marland Kitchen appears in no distress. Vitals Time Taken: 11:03 AM, Height: 69 in, Weight: 195 lbs, BMI: 28.8, Temperature: 98.7 F, Pulse: 58 bpm, Respiratory Rate: 18 breaths/min, Blood Pressure: 167/76 mmHg. Cardiovascular Pedal pulses are nonpalpable on the right. General Notes: Wound exam; 100% slough covered I used an open curette to remove the slough the wound bed looks stable but pale. No deeper debridement will be entertained until we can address her vascular insufficiency there is no evidence of surrounding infection Integumentary (Hair, Skin) Wound #2 status is Open. Original cause of wound was Trauma. The wound is located on the Right,Dorsal Foot. The wound measures 4cm length x 3.5cm width x 0.2cm depth; 10.996cm^2 area and 2.199cm^3 volume. There is Fat Layer (Subcutaneous Tissue) Exposed exposed. There is no tunneling or undermining noted. There is a medium amount of serosanguineous drainage noted. The wound margin is thickened. There is small (1-33%) red granulation within the wound bed. There is a large (67-100%) amount of necrotic tissue within the wound bed including Adherent Slough. Assessment Active Problems ICD-10 Type 2 diabetes mellitus with diabetic peripheral angiopathy with gangrene Type 2 diabetes mellitus with diabetic polyneuropathy Non-pressure chronic ulcer of other part of left foot with other specified severity Type 2 diabetes mellitus with foot ulcer Non-pressure chronic ulcer of other part of right foot with other specified severity Procedures Wound #2 Pre-procedure diagnosis of Wound #2 is a Cellulitis located on the Right,Dorsal Foot . There was a Selective/Open Wound Non-Viable Tissue Debridement with a total area of 14 sq cm  performed by Ricard Dillon, MD. With the following instrument(s): Curette to remove Non-Viable tissue/material. Material removed includes [facility] after achieving pain control using Lidocaine. No specimens were taken. A time out was conducted at 11:31, prior to the start of the procedure. A Minimum amount of bleeding was controlled with Pressure. The procedure was tolerated well. Post Debridement Measurements: 4cm length x 3.5cm width x 0.2cm depth; 2.199cm^3 volume. Character of Wound/Ulcer Post Debridement is stable. Post procedure Diagnosis Wound #2: Same as Pre-Procedure [name], [name] ([id number]) Plan Wound Cleansing: Wound #2 Right,Dorsal Foot: Clean wound with Normal Saline. Anesthetic (add to Medication List): Wound #2 Right,Dorsal Foot: Topical Lidocaine 4% cream applied to wound bed prior to debridement (In Clinic Only). Primary Wound Dressing: Wound #2 Right,Dorsal Foot: Santyl Ointment Secondary Dressing: Wound #2 Right,Dorsal Foot: Boardered Foam Dressing Dressing Change Frequency: Wound #2 Right,Dorsal Foot: Change dressing every day. Follow-up Appointments: Wound #2 Right,Dorsal Foot: Return Appointment in 2 weeks. Edema Control: Wound #2 Right,Dorsal Foot: Elevate legs to the level  of the heart and pump ankles as often as possible Medications-please add to medication list.: Wound #2 Right,Dorsal Foot: Santyl Enzymatic Ointment The following medication(s) was prescribed: Santyl topical 250 unit/gram ointment ointment topical to wound change daily (continuing rx) starting 07/06/2019 1. I am continuing with Santyl ointment that I have represcribed today 2. The surface slough comes off easily with a blunt curette. She may require deeper debridement but I am not prepared to do that until there is a vascular review 3. The patient tells me she has an appointment with her endocrinologist next week who was at [facility]. She reviews this Dr. Is her primary care doctor and is  hopeful to get a referral to vascular surgery at [facility]. 4. I will continue to follow her in on almost palliative basis in 2 weeks. If we can reestablish blood flow to this area then a more aggressive debridement and more aggressive dressings might be possible Electronic Signature(s) Signed: 07/06/2019 5:01:35 PM By: Linton Ham MD Entered By: Linton Ham on 07/06/2019 11:46:48 [name], [name] ([id number]) -------------------------------------------------------------------------------- SuperBill Details Patient Name: [name] Date of Service: 07/06/2019 Medical Record Number: [id number] Patient Account Number: [id number] Date of Birth/Sex: 1930/06/24 (86 y.o. 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 foot with other specified severity E11.621 Type 2 diabetes mellitus with foot ulcer L97.518 Non-pressure chronic ulcer of other part of right foot with other specified severity Facility Procedures CPT4 Code: [id number] Description: [id number] - DEBRIDE WOUND 1ST 20 SQ CM OR < Modifier: Quantity: 1 CPT4 Code: Description: ICD-10 Diagnosis Description L97.518 Non-pressure chronic ulcer of other part of right foot with other specified sev Modifier: erity Quantity: Physician Procedures CPT4 [id number] Description: [id number] - WC PHYS DEBR WO ANESTH 20 SQ CM Modifier: Quantity: 1 CPT4 Code: Description: ICD-10 Diagnosis Description L97.518 Non-pressure chronic ulcer of other part of right foot with other specified sev Modifier: erity Quantity: Electronic Signature(s) Signed: 07/06/2019 5:01:35 PM By: Linton Ham MD Entered By: Linton Ham on 07/06/2019 11:47:04

## 2019-07-15 NOTE — Progress Notes (Signed)
[place], [place] ([id number]) Visit Report for 07/06/2019 Arrival Information Details Patient Name: [name], [name] [facility] Date of Service: 07/06/2019 11:00 AM Medical Record Number: [id number] Patient Account Number: [id number] Date of Birth/Sex: 1930-09-15 (86 y.o. F) Treating RN: Montey Hora Primary Care Grahm Etsitty: Emily Filbert Other Clinician: Referring Shemica Meath: Emily Filbert Treating Kartier Bennison/Extender: Tito Dine in Treatment: 11 Visit Information History Since Last Visit Added or deleted any medications: No Patient Arrived: Walker Any new allergies or adverse reactions: No Arrival Time: 11:03 Had a fall or experienced change in No Accompanied By: self activities of daily living that may affect Transfer Assistance: None risk of falls: Patient Identification Verified: Yes Signs or symptoms of abuse/neglect since last visito No Secondary Verification Process Completed: Yes Hospitalized since last visit: No Patient Has Alerts: Yes Implantable device outside of the clinic excluding No Patient Alerts: Patient on Blood Thinner cellular tissue based products placed in the center ABI L 1.19 R 0.48 03/2019 since last visit: 2-325mg  Aspirin daily Has Dressing in Place as Prescribed: Yes Pain Present Now: No Electronic Signature(s) Signed: 07/06/2019 4:59:40 PM By: Montey Hora Entered By: Montey Hora on 07/06/2019 11:03:21 Skolnik, Teneshia ([id number]) -------------------------------------------------------------------------------- Encounter Discharge Information Details Patient Name: [name] Date of Service: 07/06/2019 11:00 AM Medical Record Number: [id number] Patient Account Number: [id number] Date of Birth/Sex: 10/17/1930 (86 y.o. F) Treating RN: Cornell Barman Primary Care Anneliese Leblond: Emily Filbert Other Clinician: Referring Antar Milks: Emily Filbert Treating Adison Reifsteck/Extender: Tito Dine in Treatment: 11 Encounter Discharge Information Items Post Procedure  Vitals Discharge Condition: Stable Temperature (F): 98.7 Ambulatory Status: Walker Pulse (bpm): 58 Discharge Destination: Home Respiratory Rate (breaths/min): 16 Transportation: Private Auto Blood Pressure (mmHg): 167/76 Accompanied By: self Schedule Follow-up Appointment: No Clinical Summary of Care: Electronic Signature(s) Signed: 07/15/2019 5:40:44 PM By: Gretta Cool, BSN, RN, CWS, Kim RN, BSN Entered By: Gretta Cool, BSN, RN, CWS, Kim on 07/06/2019 11:34:30 [name], [name] ([id number]) -------------------------------------------------------------------------------- Lower Extremity Assessment Details Patient Name: [name] Date of Service: 07/06/2019 11:00 AM Medical Record Number: [id number] Patient Account Number: [id number] Date of Birth/Sex: 17-Mar-1931 (86 y.o. F) Treating RN: Montey Hora Primary Care Dandrea Medders: Emily Filbert Other Clinician: Referring Azarias Chiou: Emily Filbert Treating Dariella Gillihan/Extender: Tito Dine in Treatment: 11 Edema Assessment Assessed: [Left: No] [Right: No] Edema: [Left: N] [Right: o] Vascular Assessment Pulses: Dorsalis Pedis Palpable: [Right:Yes] Electronic Signature(s) Signed: 07/06/2019 4:59:40 PM By: Montey Hora Entered By: Montey Hora on 07/06/2019 11:09:25 [name], [name] ([id number]) -------------------------------------------------------------------------------- Multi Wound Chart Details Patient Name: [name] Date of Service: 07/06/2019 11:00 AM Medical Record Number: [id number] Patient Account Number: [id number] Date of Birth/Sex: 12/02/1930 (86 y.o. F) Treating RN: Cornell Barman Primary Care Usiel Astarita: Emily Filbert Other Clinician: Referring Wynn Kernes: Emily Filbert Treating Azelyn Batie/Extender: Tito Dine in Treatment: 11 Vital Signs Height(in): 81 Pulse(bpm): 53 Weight(lbs): 195 Blood Pressure(mmHg): 167/76 Body Mass Index(BMI): 29 Temperature(F): 98.7 Respiratory Rate(breaths/min): 18 Photos: [N/A:N/A] Wound  Location: Right Foot - Dorsal N/A N/A Wounding Event: Trauma N/A N/A Primary Etiology: Cellulitis N/A N/A Comorbid History: Cataracts, Arrhythmia, Congestive N/A N/A Heart Failure, Hypertension, Type II Diabetes, Rheumatoid Arthritis Date Acquired: 03/24/2019 N/A N/A Weeks of Treatment: 11 N/A N/A Wound Status: Open N/A N/A Measurements L x W x D (cm) 4x3.5x0.2 N/A N/A Area (cm) : 10.996 N/A N/A Volume (cm) : 2.199 N/A N/A % Reduction in Area: -66.70% N/A N/A % Reduction in Volume: -233.20% N/A N/A Classification: Full Thickness Without Exposed N/A N/A Support Structures Exudate Amount: Medium N/A N/A Exudate Type: Serosanguineous N/A N/A Exudate Color:  red, brown N/A N/A Wound Margin: Thickened N/A N/A Granulation Amount: Small (1-33%) N/A N/A Granulation Quality: Red N/A N/A Necrotic Amount: Large (67-100%) N/A N/A Exposed Structures: Fat Layer (Subcutaneous Tissue) N/A N/A Exposed: Yes Fascia: No Tendon: No Muscle: No Joint: No Bone: No Epithelialization: None N/A N/A Debridement: Debridement - Selective/Open N/A N/A Wound Pre-procedure Verification/Time 11:31 N/A N/A Out Taken: Pain Control: Lidocaine N/A N/A Tissue Debrided: Slough N/A N/A Level: Non-Viable Tissue N/A N/A [name], [name] ([id number]) Debridement Area (sq cm): 14 N/A N/A Instrument: Curette N/A N/A Bleeding: Minimum N/A N/A Hemostasis Achieved: Pressure N/A N/A Debridement Treatment Procedure was tolerated well N/A N/A Response: Post Debridement Measurements 4x3.5x0.2 N/A N/A L x W x D (cm) Post Debridement Volume: (cm) 2.199 N/A N/A Procedures Performed: Debridement N/A N/A Treatment Notes Wound #2 (Right, Dorsal Foot) Notes Santyl, Bordered Foam dressing to secure Electronic Signature(s) Signed: 07/06/2019 5:01:35 PM By: Linton Ham MD Entered By: Linton Ham on 07/06/2019 11:37:39 [name], [name]  ([id number]) -------------------------------------------------------------------------------- Multi-Disciplinary Care Plan Details Patient Name: [name] Date of Service: 07/06/2019 11:00 AM Medical Record Number: [id number] Patient Account Number: [id number] Date of Birth/Sex: March 16, 1931 (86 y.o. F) Treating RN: Cornell Barman Primary Care Kharlie Bring: Emily Filbert Other Clinician: Referring Torrion Witter: Emily Filbert Treating Jasaun Carn/Extender: Tito Dine in Treatment: 11 Active Inactive Necrotic Tissue Nursing Diagnoses: Impaired tissue integrity related to necrotic/devitalized tissue Knowledge deficit related to management of necrotic/devitalized tissue Goals: Necrotic/devitalized tissue will be minimized in the wound bed Date Initiated: 04/20/2019 Target Resolution Date: 05/04/2019 Goal Status: Active Interventions: Assess patient pain level pre-, during and post procedure and prior to discharge Provide education on necrotic tissue and debridement process Treatment Activities: Apply topical anesthetic as ordered : 04/20/2019 Notes: Orientation to the Wound Care Program Nursing Diagnoses: Knowledge deficit related to the wound healing center program Goals: Patient/caregiver will verbalize understanding of the [place] Date Initiated: 04/20/2019 Target Resolution Date: 05/04/2019 Goal Status: Active Interventions: Provide education on orientation to the wound center Notes: Venous Leg Ulcer Nursing Diagnoses: Actual venous Insuffiency (use after diagnosis is confirmed) Goals: Patient/caregiver will verbalize understanding of disease process and disease management Date Initiated: 04/20/2019 Target Resolution Date: 05/04/2019 Goal Status: Active Interventions: Assess peripheral edema status every visit. NotesKelsy Susalla, [name] ([id number]) Wound/Skin Impairment Nursing Diagnoses: Impaired tissue integrity Goals: Patient/caregiver will verbalize  understanding of skin care regimen Date Initiated: 04/20/2019 Target Resolution Date: 05/04/2019 Goal Status: Active Ulcer/skin breakdown will have a volume reduction of 30% by week 4 Date Initiated: 04/20/2019 Target Resolution Date: 05/18/2019 Goal Status: Active Interventions: Assess ulceration(s) every visit Treatment Activities: Skin care regimen initiated : 04/20/2019 Topical wound management initiated : 04/20/2019 Notes: Electronic Signature(s) Signed: 07/15/2019 5:40:44 PM By: Gretta Cool, BSN, RN, CWS, Kim RN, BSN Entered By: Gretta Cool, BSN, RN, CWS, Kim on 07/06/2019 11:31:07 [name], [name] ([id number]) -------------------------------------------------------------------------------- Pain Assessment Details Patient Name: [name] Date of Service: 07/06/2019 11:00 AM Medical Record Number: [id number] Patient Account Number: [id number] Date of Birth/Sex: [birth year] (86 y.o. F) Treating RN: Montey Hora Primary Care Maryhelen Lindler: Emily Filbert Other Clinician: Referring Thaer Miyoshi: Emily Filbert Treating Chanon Loney/Extender: Tito Dine in Treatment: 11 Active Problems Location of Pain Severity and Description of Pain Patient Has Paino No Site Locations Pain Management and Medication Current Pain Management: Electronic Signature(s) Signed: 07/06/2019 4:59:40 PM By: Montey Hora Entered By: Montey Hora on 07/06/2019 11:06:55 [name], [name] ([id number]) -------------------------------------------------------------------------------- Patient/Caregiver Education Details Patient Name: [name] Date of Service: 07/06/2019 11:00 AM Medical Record Number: [id number] Patient  Account Number: [id number] Date of Birth/Gender: Jul 17, 1930 (86 y.o. F) Treating RN: Cornell Barman Primary Care Physician: Emily Filbert Other Clinician: Referring Physician: Emily Filbert Treating Physician/Extender: Tito Dine in Treatment: 11 Education Assessment Education Provided  To: Patient Education Topics Provided Wound Debridement: Handouts: Wound Debridement Methods: Demonstration, Explain/Verbal Responses: State content correctly Electronic Signature(s) Signed: 07/15/2019 5:40:44 PM By: Gretta Cool, BSN, RN, CWS, Kim RN, BSN Entered By: Gretta Cool, BSN, RN, CWS, Kim on 07/06/2019 11:33:37 [name], [name] ([id number]) -------------------------------------------------------------------------------- Wound Assessment Details Patient Name: [name] Date of Service: 07/06/2019 11:00 AM Medical Record Number: [id number] Patient Account Number: [id number] Date of Birth/Sex: 16-Oct-1930 (86 y.o. F) Treating RN: Montey Hora Primary Care Margareth Kanner: Emily Filbert Other Clinician: Referring Laquesha Holcomb: Emily Filbert Treating Lunetta Marina/Extender: Tito Dine in Treatment: 11 Wound Status Wound Number: 2 Primary Cellulitis Etiology: Wound Location: Right Foot - Dorsal Wound Open Wounding Event: Trauma Status: Date Acquired: 03/24/2019 Comorbid Cataracts, Arrhythmia, Congestive Heart Failure, Weeks Of Treatment: 11 History: Hypertension, Type II Diabetes, Rheumatoid Arthritis Clustered Wound: No Photos Wound Measurements Length: (cm) 4 Width: (cm) 3.5 Depth: (cm) 0.2 Area: (cm) 10.996 Volume: (cm) 2.199 % Reduction in Area: -66.7% % Reduction in Volume: -233.2% Epithelialization: None Tunneling: No Undermining: No Wound Description Classification: Full Thickness Without Exposed Support Structures Wound Margin: Thickened Exudate Amount: Medium Exudate Type: Serosanguineous Exudate Color: red, brown Foul Odor After Cleansing: No Slough/Fibrino Yes Wound Bed Granulation Amount: Small (1-33%) Exposed Structure Granulation Quality: Red Fascia Exposed: No Necrotic Amount: Large (67-100%) Fat Layer (Subcutaneous Tissue) Exposed: Yes Necrotic Quality: Adherent Slough Tendon Exposed: No Muscle Exposed: No Joint Exposed: No Bone Exposed: No Electronic  Signature(s) Signed: 07/06/2019 4:59:40 PM By: Montey Hora Entered By: Montey Hora on 07/06/2019 11:08:25 [name], [name] ([id number]) -------------------------------------------------------------------------------- [place] Details Patient Name: [name] Date of Service: 07/06/2019 11:00 AM Medical Record Number: [id number] Patient Account Number: [id number] Date of Birth/Sex: 10-Jun-1930 (86 y.o. F) Treating RN: Montey Hora Primary Care Marris Frontera: Emily Filbert Other Clinician: Referring Armeda Plumb: Emily Filbert Treating [name]/Extender: Tito Dine in Treatment: 11 Vital Signs Time Taken: 11:03 Temperature (F): 98.7 Height (in): 69 Pulse (bpm): 58 Weight (lbs): 195 Respiratory Rate (breaths/min): 18 Body Mass Index (BMI): 28.8 Blood Pressure (mmHg): 167/76 Reference Range: 80 - 120 mg / dl Electronic Signature(s) Signed: 07/06/2019 4:59:40 PM By: Montey Hora Entered By: Montey Hora on 07/06/2019 11:06:49

## 2019-07-20 ENCOUNTER — Ambulatory Visit: Payer: PRIVATE HEALTH INSURANCE | Admitting: Internal Medicine

## 2019-07-27 ENCOUNTER — Other Ambulatory Visit: Payer: Self-pay

## 2019-07-27 ENCOUNTER — Encounter: Payer: Medicare Other | Admitting: Internal Medicine

## 2019-07-27 ENCOUNTER — Ambulatory Visit: Payer: PRIVATE HEALTH INSURANCE | Admitting: Internal Medicine

## 2019-07-27 DIAGNOSIS — E11621 Type 2 diabetes mellitus with foot ulcer: Secondary | ICD-10-CM | POA: Diagnosis not present

## 2019-07-28 ENCOUNTER — Ambulatory Visit: Payer: PRIVATE HEALTH INSURANCE | Admitting: Physician Assistant

## 2019-07-29 NOTE — Progress Notes (Signed)
[place], [place] ([id number]) Visit Report for 07/27/2019 Arrival Information Details Patient Name: [name], [name] [facility] Date of Service: 07/27/2019 11:00 AM Medical Record Number: [id number] Patient Account Number: [id number] Date of Birth/Sex: [birth year] (86 y.o. F) Treating RN: Cornell Barman Primary Care Jamarr Treinen: Emily Filbert Other Clinician: Referring Aslyn Cottman: Emily Filbert Treating Danyel Tobey/Extender: Tito Dine in Treatment: 14 Visit Information History Since Last Visit Added or deleted any medications: No Patient Arrived: [place] Any new allergies or adverse reactions: No Arrival Time: 10:56 Had a fall or experienced change in No Accompanied By: self activities of daily living that may affect Transfer Assistance: None risk of falls: Patient Identification Verified: Yes Signs or symptoms of abuse/neglect since last visito No Secondary Verification Process Completed: Yes Hospitalized since last visit: No Patient Has Alerts: Yes Implantable device outside of the clinic excluding No Patient Alerts: Patient on Blood Thinner cellular tissue based products placed in the center ABI L 1.19 R 0.48 03/2019 since last visit: 2-325mg  Aspirin daily Has Dressing in Place as Prescribed: Yes Has Compression in Place as Prescribed: Yes Pain Present Now: No Electronic Signature(s) Signed: 07/27/2019 4:00:59 PM By: Lorine Bears RCP, RRT, CHT Entered By: Lorine Bears on 07/27/2019 10:57:08 Rumpf, Xanthe ([id number]) -------------------------------------------------------------------------------- Encounter Discharge Information Details Patient Name: [name] Date of Service: 07/27/2019 11:00 AM Medical Record Number: [id number] Patient Account Number: [id number] Date of Birth/Sex: 1931-01-07 (86 y.o. F) Treating RN: Cornell Barman Primary Care Tremond Shimabukuro: Emily Filbert Other Clinician: Referring Lorilee Cafarella: Emily Filbert Treating Zael Shuman/Extender: Tito Dine in Treatment: 14 Encounter Discharge Information Items Post Procedure Vitals Discharge Condition: Stable Temperature (F): 98.3 Ambulatory Status: Ambulatory Pulse (bpm): 56 Discharge Destination: Home Respiratory Rate (breaths/min): 18 Transportation: Private Auto Blood Pressure (mmHg): 171/63 Accompanied By: self Schedule Follow-up Appointment: Yes Clinical Summary of Care: Electronic Signature(s) Signed: 07/29/2019 5:21:44 PM By: Gretta Cool, BSN, RN, CWS, Kim RN, BSN Entered By: Gretta Cool, BSN, RN, CWS, Kim on 07/27/2019 11:08:45 [name], Dalila ([id number]) -------------------------------------------------------------------------------- Lower Extremity Assessment Details Patient Name: [name] Date of Service: 07/27/2019 11:00 AM Medical Record Number: [id number] Patient Account Number: [id number] Date of Birth/Sex: 1931/04/23 (86 y.o. F) Treating RN: Montey Hora Primary Care Stanly Si: Emily Filbert Other Clinician: Referring Tiquan Bouch: Emily Filbert Treating Drayce Tawil/Extender: Tito Dine in Treatment: 14 Edema Assessment Assessed: [Left: No] [Right: No] Edema: [Left: Ye] [Right: s] Vascular Assessment Pulses: Dorsalis Pedis Palpable: [Right:Yes] Electronic Signature(s) Signed: 07/27/2019 4:39:55 PM By: Montey Hora Entered By: Montey Hora on 07/27/2019 11:02:01 Vanleeuwen, Katherleen ([id number]) -------------------------------------------------------------------------------- Multi Wound Chart Details Patient Name: [name] Date of Service: 07/27/2019 11:00 AM Medical Record Number: [id number] Patient Account Number: [id number] Date of Birth/Sex: 1930-07-07 (86 y.o. F) Treating RN: Cornell Barman Primary Care Ellice Boultinghouse: Emily Filbert Other Clinician: Referring Darina Hartwell: Emily Filbert Treating Jamez Ambrocio/Extender: Tito Dine in Treatment: 14 Vital Signs Height(in): 69 Pulse(bpm): 59 Weight(lbs): 195 Blood Pressure(mmHg): 171/63 Body Mass Index(BMI):  29 Temperature(F): 98.3 Respiratory Rate(breaths/min): 18 Photos: [N/A:N/A] Wound Location: Right Foot - Dorsal N/A N/A Wounding Event: Trauma N/A N/A Primary Etiology: Cellulitis N/A N/A Comorbid History: Cataracts, Arrhythmia, Congestive N/A N/A Heart Failure, Hypertension, Type II Diabetes, Rheumatoid Arthritis Date Acquired: 03/24/2019 N/A N/A Weeks of Treatment: 14 N/A N/A Wound Status: Open N/A N/A Measurements L x W x D (cm) 4x3.5x0.2 N/A N/A Area (cm) : 10.996 N/A N/A Volume (cm) : 2.199 N/A N/A % Reduction in Area: -66.70% N/A N/A % Reduction in Volume: -233.20% N/A N/A Classification: Full Thickness Without Exposed N/A N/A Support Structures  Exudate Amount: Medium N/A N/A Exudate Type: Serosanguineous N/A N/A Exudate Color: red, brown N/A N/A Wound Margin: Thickened N/A N/A Granulation Amount: Small (1-33%) N/A N/A Granulation Quality: Red N/A N/A Necrotic Amount: Large (67-100%) N/A N/A Exposed Structures: Fat Layer (Subcutaneous Tissue) N/A N/A Exposed: Yes Fascia: No Tendon: No Muscle: No Joint: No Bone: No Epithelialization: None N/A N/A Debridement: Chemical/Enzymatic/Mechanical N/A N/A Pre-procedure Verification/Time 11:06 N/A N/A Out Taken: Pain Control: Lidocaine N/A N/A Instrument: Other(tongue blade) N/A N/A Bleeding: None N/A N/A Procedure was tolerated well N/A N/A Favorite, Labrina ([id number]) Debridement Treatment Response: Post Debridement Measurements 4x3.5x0.2 N/A N/A L x W x D (cm) Post Debridement Volume: (cm) 2.199 N/A N/A Procedures Performed: Debridement N/A N/A Treatment Notes Wound #2 (Right, Dorsal Foot) Notes Santyl, Bordered Foam dressing to secure Electronic Signature(s) Signed: 07/27/2019 5:07:28 PM By: Linton Ham MD Entered By: Linton Ham on 07/27/2019 11:09:08 Mcbrien, Neilani ([id number]) -------------------------------------------------------------------------------- Multi-Disciplinary Care Plan Details Patient  Name: [name] Date of Service: 07/27/2019 11:00 AM Medical Record Number: [id number] Patient Account Number: [id number] Date of Birth/Sex: Aug 04, 1930 (86 y.o. F) Treating RN: Cornell Barman Primary Care Steffan Caniglia: Emily Filbert Other Clinician: Referring Kayla Deshaies: Emily Filbert Treating Arionne Iams/Extender: Tito Dine in Treatment: 14 Active Inactive Necrotic Tissue Nursing Diagnoses: Impaired tissue integrity related to necrotic/devitalized tissue Knowledge deficit related to management of necrotic/devitalized tissue Goals: Necrotic/devitalized tissue will be minimized in the wound bed Date Initiated: 04/20/2019 Target Resolution Date: 05/04/2019 Goal Status: Active Interventions: Assess patient pain level pre-, during and post procedure and prior to discharge Provide education on necrotic tissue and debridement process Treatment Activities: Apply topical anesthetic as ordered : 04/20/2019 Notes: Orientation to the Wound Care Program Nursing Diagnoses: Knowledge deficit related to the wound healing center program Goals: Patient/caregiver will verbalize understanding of the [place] Date Initiated: 04/20/2019 Target Resolution Date: 05/04/2019 Goal Status: Active Interventions: Provide education on orientation to the wound center Notes: Venous Leg Ulcer Nursing Diagnoses: Actual venous Insuffiency (use after diagnosis is confirmed) Goals: Patient/caregiver will verbalize understanding of disease process and disease management Date Initiated: 04/20/2019 Target Resolution Date: 05/04/2019 Goal Status: Active Interventions: Assess peripheral edema status every visit. NotesSloka Hudson, Carime ([id number]) Wound/Skin Impairment Nursing Diagnoses: Impaired tissue integrity Goals: Patient/caregiver will verbalize understanding of skin care regimen Date Initiated: 04/20/2019 Target Resolution Date: 05/04/2019 Goal Status: Active Ulcer/skin breakdown will  have a volume reduction of 30% by week 4 Date Initiated: 04/20/2019 Target Resolution Date: 05/18/2019 Goal Status: Active Interventions: Assess ulceration(s) every visit Treatment Activities: Skin care regimen initiated : 04/20/2019 Topical wound management initiated : 04/20/2019 Notes: Electronic Signature(s) Signed: 07/29/2019 5:21:44 PM By: Gretta Cool, BSN, RN, CWS, Kim RN, BSN Entered By: Gretta Cool, BSN, RN, CWS, Kim on 07/27/2019 11:04:28 [name], Bonnie ([id number]) -------------------------------------------------------------------------------- Pain Assessment Details Patient Name: [name] Date of Service: 07/27/2019 11:00 AM Medical Record Number: [id number] Patient Account Number: [id number] Date of Birth/Sex: [birth year] (86 y.o. F) Treating RN: Montey Hora Primary Care Ormond Lazo: Emily Filbert Other Clinician: Referring Luverne Zerkle: Emily Filbert Treating Medha Pippen/Extender: Tito Dine in Treatment: 14 Active Problems Location of Pain Severity and Description of Pain Patient Has Paino No Site Locations Pain Management and Medication Current Pain Management: Notes reports sensitivity but not pain Electronic Signature(s) Signed: 07/27/2019 4:39:55 PM By: Montey Hora Entered By: Montey Hora on 07/27/2019 11:01:53 Poncedeleon, Laiken ([id number]) -------------------------------------------------------------------------------- Patient/Caregiver Education Details Patient Name: [name] Date of Service: 07/27/2019 11:00 AM Medical Record Number: [id number] Patient Account Number: [id number] Date of Birth/Gender:  28-Jan-1931 (86 y.o. F) Treating RN: Cornell Barman Primary Care Physician: Emily Filbert Other Clinician: Referring Physician: Emily Filbert Treating Physician/Extender: Tito Dine in Treatment: 14 Education Assessment Education Provided To: Patient Education Topics Provided Wound Debridement: Handouts: Wound Debridement Methods: Demonstration,  Explain/Verbal Responses: State content correctly Wound/Skin Impairment: Handouts: Caring for Your Ulcer Methods: Demonstration, Explain/Verbal Responses: State content correctly Electronic Signature(s) Signed: 07/29/2019 5:21:44 PM By: Gretta Cool, BSN, RN, CWS, Kim RN, BSN Entered By: Gretta Cool, BSN, RN, CWS, Kim on 07/27/2019 11:08:01 [name], Neysa ([id number]) -------------------------------------------------------------------------------- Wound Assessment Details Patient Name: [name] Date of Service: 07/27/2019 11:00 AM Medical Record Number: [id number] Patient Account Number: [id number] Date of Birth/Sex: 1931/03/29 (86 y.o. F) Treating RN: Montey Hora Primary Care Pleasant Britz: Emily Filbert Other Clinician: Referring Melody Savidge: Emily Filbert Treating Hera Celaya/Extender: Tito Dine in Treatment: 14 Wound Status Wound Number: 2 Primary Cellulitis Etiology: Wound Location: Right Foot - Dorsal Wound Open Wounding Event: Trauma Status: Date Acquired: 03/24/2019 Comorbid Cataracts, Arrhythmia, Congestive Heart Failure, Weeks Of Treatment: 14 History: Hypertension, Type II Diabetes, Rheumatoid Arthritis Clustered Wound: No Photos Wound Measurements Length: (cm) 4 Width: (cm) 3.5 Depth: (cm) 0.2 Area: (cm) 10.996 Volume: (cm) 2.199 % Reduction in Area: -66.7% % Reduction in Volume: -233.2% Epithelialization: None Tunneling: No Undermining: No Wound Description Classification: Full Thickness Without Exposed Support Structures Wound Margin: Thickened Exudate Amount: Medium Exudate Type: Serosanguineous Exudate Color: red, brown Foul Odor After Cleansing: No Slough/Fibrino Yes Wound Bed Granulation Amount: Small (1-33%) Exposed Structure Granulation Quality: Red Fascia Exposed: No Necrotic Amount: Large (67-100%) Fat Layer (Subcutaneous Tissue) Exposed: Yes Necrotic Quality: Adherent Slough Tendon Exposed: No Muscle Exposed: No Joint Exposed: No Bone  Exposed: No Treatment Notes Wound #2 (Right, Dorsal Foot) Notes Santyl, Bordered Foam dressing to secure Electronic Signature(s) Signed: 07/27/2019 4:39:55 PM By: Teola Bradley, Zareen ([id number]) Entered By: Montey Hora on 07/27/2019 11:01:15 Vadala, Licet ([id number]) -------------------------------------------------------------------------------- Vitals Details Patient Name: [name] Date of Service: 07/27/2019 11:00 AM Medical Record Number: [id number] Patient Account Number: [id number] Date of Birth/Sex: 1930-12-29 (86 y.o. F) Treating RN: Cornell Barman Primary Care Deshauna Cayson: Emily Filbert Other Clinician: Referring Mariena Meares: Emily Filbert Treating Artis Beggs/Extender: Tito Dine in Treatment: 14 Vital Signs Time Taken: 10:55 Temperature (F): 98.3 Height (in): 69 Pulse (bpm): 56 Weight (lbs): 195 Respiratory Rate (breaths/min): 18 Body Mass Index (BMI): 28.8 Blood Pressure (mmHg): 171/63 Reference Range: 80 - 120 mg / dl Electronic Signature(s) Signed: 07/27/2019 4:00:59 PM By: Lorine Bears RCP, RRT, CHT Entered By: Lorine Bears on 07/27/2019 11:00:04

## 2019-07-29 NOTE — Progress Notes (Signed)
[name], [name] ([id number]) Visit Report for 07/27/2019 Debridement Details Patient Name: [name], [name] [facility] Date of Service: 07/27/2019 11:00 AM Medical Record Number: [id number] Patient Account Number: [id number] Date of Birth/Sex: 1930-09-05 (86 y.o. F) Treating RN: Cornell Barman Primary Care Provider: Emily Filbert Other Clinician: Referring Provider: Emily Filbert Treating Provider/Extender: Tito Dine in Treatment: 14 Debridement Performed for Wound #2 Right,Dorsal Foot Assessment: Performed By: Physician Ricard Dillon, MD Debridement Type: Chemical/Enzymatic/Mechanical Agent Used: Santyl Level of Consciousness (Pre- Awake and Alert procedure): Pre-procedure Verification/Time Out Yes - 11:06 Taken: Start Time: 11:06 Pain Control: Lidocaine Instrument: Other : tongue blade Bleeding: None Response to Treatment: Procedure was tolerated well Level of Consciousness (Post- Awake and Alert procedure): Post Debridement Measurements of Total Wound Length: (cm) 4 Width: (cm) 3.5 Depth: (cm) 0.2 Volume: (cm) 2.199 Character of Wound/Ulcer Post Debridement: Stable Post Procedure Diagnosis Same as Pre-procedure Electronic Signature(s) Signed: 07/27/2019 5:07:28 PM By: Linton Ham MD Signed: 07/29/2019 5:21:44 PM By: Gretta Cool, BSN, RN, CWS, Kim RN, BSN Entered By: Gretta Cool, BSN, RN, CWS, Kim on 07/27/2019 11:07:22 [name], [place] ([id number]) -------------------------------------------------------------------------------- HPI Details Patient Name: [name] Date of Service: 07/27/2019 11:00 AM Medical Record Number: [id number] Patient Account Number: [id number] Date of Birth/Sex: Jan 12, 1931 (86 y.o. F) Treating RN: Cornell Barman Primary Care Provider: Emily Filbert Other Clinician: Referring Provider: Emily Filbert Treating Provider/Extender: Tito Dine in Treatment: 14 History of Present Illness HPI Description: ADMISSION 04/20/2019 This is an 86 year old woman who  lives in the independent part of [place]. She is a type II diabetic on oral agents and insulin with a recent hemoglobin A1c of 6.3 on 10/6. She was initially seen by primary care on 11/12 after dropping a heavy walker on her bilateral dorsal feet. She had blisters on the right and left lateral foot. She was given doxycycline. She was seen the next day in [place] clinic on 11/13 continued on the.C. Bilateral foot x-rays were negative. She was admitted to hospital from 11/17 through 11/27 with acute kidney injury I did not research this although the patient tells me that at discharge her kidney function had returned to normal. During the course of this hospitalization it was noted she had wounds on her bilateral feet and her peripheral pulses were nonpalpable. She was evaluated by Dr. Delana Meyer. It was recommended that she consider angiography on the right leg and it was unlikely that she would heal the right leg as her ABI here was 0.48 with monophasic waveforms on the left her ABI was 1.19 with biphasic waveforms. One would wonder if the ABI was artificially elevated on the left. In any case Dr. Cydney Ok note accurately describes the patient's current state of mind. She does not want to rush into an intervention and she is here for our review of her wounds. Noteworthy that when she arrived I think her renal insufficiency was felt to be prerenal. Her GFR on arrival was 22 now 36. Past medical history includes type 2 diabetes with peripheral neuropathy, atrial fibrillation, renal insufficiency We did not calculate ABIs in our clinic this was 0.48 on the right and 1.19 on the left as noted above. Monophasic waveforms on the right biphasic on the left 12/16; patient admitted to the clinic last week with trauma critical limb ischemia on the right. Today her wound on the left dorsal foot is healed. She still has the black eschar on the dorsal right foot that we have been using Santyl on. There is  apparently some drainage. She  arrives in clinic today with more swelling on the right great and the left dorsal surrounding her foot wounds. Some erythema especially on the right. There is some tenderness on the right but not as much as I might expect if this was all infection nevertheless he does have some neuropathy which might be blunting discomfort. She reports no additional trauma. She is at the rehab section of [facility], she lives in the independent part of [place]. She tells me she has some pain in the right leg at night which wakes her up from sleeping although she manages to get back to sleep. She has about 150 foot exercise tolerance and then gets fatigue in her legs. Again I discussed the issue of angiogram with the patient especially on the right. She just will not agree to this. She states she wants the rehab and then go to [facility] where a list of her specialists. She was on Levaquin last week we did not prescribe this but she is finished. She describes some nausea and loose stools but does not sound dramatically unwell with this Left dorsal foot had some very superficial areas today I think related to swelling. The big problem is on the dorsal right foot black surface eschar with increased swelling. She does not have any pain or tenderness although she has neuropathy. We have been using Santyl. She has PAD and again I think needs an angiogram. 12/30; 2-week follow-up. The areas on the right dorsal foot. Everything is healed on the left. She is still in the rehab part of [place]. She went to see Dr. Lucky Cowboy on 12/29. He reiterated to her he felt that she needed angiography on the right leg for critical limb ischemia. She seemed to have 2 major concerns that is wanting to see a nephrologist prior to procedure in consultation. She does not currently have a nephrologist. She also had no confidence in the original arterial studies that were done in the hospital and wanted them redone. I do  not think Dr. Lucky Cowboy felt that they needed to be redone and wanted to proceed with angiography. At the end of it he simply stated that he would be glad to do the angiogram at any time the patient agreed to proceed I looked briefly at her kidney function which had normalized by the time she left the hospital in late November. I do not know that the patient has any additional concern at this point other than the reduction in GFR associated with her diabetes and normal aging. 1/27; I have not seen this patient in almost 4 weeks. She was apparently isolated in the [facility] facility because of COVID-19. She tells me she was mildly symptomatic but feels better now although she still feels like she is in a "fog" as far as her mental abilities. She states that she is up and walking for short distances with a walker. She would like to go back to her independent apartment. Still using Santyl to the wound The patient is not describing a lot of pain. She does not describe claudication but her activity sounds very limited especially when she was under isolation. She is still reluctant to go forward with the angiogram that was suggested. Currently she wants to recover fully from Covid infection 2/10; 2-week follow-up. Patient arrives with 100% nonviable surface over the wound on her right dorsal foot. I changed her to [facility] from [place] last week in terms of surface the wound is deteriorated although I think  the surface area is about the same. She is not describing claudication walking about 50 feet The patient tells me she is trying to get in with her primary doctor however there is restrictions with regards to the pandemic. She wants to be referred to nephrology and then to vascular surgery at [facility] I cannot help her with this. I did tell her that her creatinine had normalized via the last lab work I could see done at [facility] in November and her estimated GFR was 53 at that time. By my estimation careful  application of contrast dye and Louro, Arnetia ([id number]) preangiogram hydration should be able to get her through the necessary angiography with no complication however she simply does not want to hear this and she is going to put this off until she can get through the [place] system 2/24; 2-week follow-up wound surface area is about the same certainly not worse. She has 100% slough cover we have been using Santyl 3/3; 2-week follow-up. The patient's wound is essentially unchanged. The surface has slough on it no debridement it certainly does not look perfused. She is worried about the erythema she is having in her foot. She had a cramp in her leg last night but is not complaining of any other type of pain. Is also is concerned about some edema that is not well corrected by the diuretic she is on 3/16; 2-week follow-up. The wound is unchanged in appearance and in size. The patient has 100% nonviable surface. She saw Dr. Bonnee Quin on 07/21/2019 vascular at [facility]. His statement was "chronic limb threatening ischemia". She was actually planned for a right leg arteriogram and intervention today however she states this was postponed and they are supposed to call her this afternoon to reschedule. Her dressing is being changed at [facility] [employer]) Signed: 07/27/2019 5:07:28 PM By: Linton Ham MD Entered By: Linton Ham on 07/27/2019 11:10:43 [place], [place] ([id number]) -------------------------------------------------------------------------------- Physical Exam Details Patient Name: [name] Date of Service: 07/27/2019 11:00 AM Medical Record Number: [id number] Patient Account Number: [id number] Date of Birth/Sex: [birth year] (86 y.o. F) Treating RN: Cornell Barman Primary Care Provider: Emily Filbert Other Clinician: Referring Provider: Emily Filbert Treating Provider/Extender: Tito Dine in Treatment: 14 Constitutional Patient is hypertensive.. Pulse regular and  within target range for patient.Marland Kitchen Respirations regular, non-labored and within target range.. Temperature is normal and within the target range for the patient.Marland Kitchen appears in no distress. Cardiovascular Nonpalpable on the right. Forefoot is cool.. Notes Wound exam; completely nonviable surface 100% covered. Right dorsal foot. No palpable pulses her forefoot is cool. Fortunately no evidence of infection no erythema no tenderness Electronic Signature(s) Signed: 07/27/2019 5:07:28 PM By: Linton Ham MD Entered By: Linton Ham on 07/27/2019 11:12:55 Spray, [place] ([id number]) -------------------------------------------------------------------------------- Physician Orders Details Patient Name: [name] Date of Service: 07/27/2019 11:00 AM Medical Record Number: [id number] Patient Account Number: [id number] Date of Birth/Sex: [birth year] (86 y.o. F) Treating RN: Cornell Barman Primary Care Provider: Emily Filbert Other Clinician: Referring Provider: Emily Filbert Treating Provider/Extender: Tito Dine in Treatment: 37 Verbal / Phone Orders: No Diagnosis Coding Wound Cleansing Wound #2 Right,Dorsal Foot o Clean wound with Normal Saline. Anesthetic (add to Medication List) Wound #2 Right,Dorsal Foot o Topical Lidocaine 4% cream applied to wound bed prior to debridement (In Clinic Only). Primary Wound Dressing Wound #2 Right,Dorsal Foot o Santyl Ointment Secondary Dressing Wound #2 Right,Dorsal Foot o Boardered Foam Dressing Dressing Change Frequency Wound #2 Right,Dorsal Foot o Change dressing every  day. Follow-up Appointments Wound #2 Right,Dorsal Foot o Return Appointment in 2 weeks. Edema Control Wound #2 Right,Dorsal Foot o Elevate legs to the level of the heart and pump ankles as often as possible Medications-please add to medication list. Wound #2 Right,Dorsal Foot o Santyl Enzymatic Ointment - Continue Electronic Signature(s) Signed:  07/27/2019 5:07:28 PM By: Linton Ham MD Signed: 07/29/2019 5:21:44 PM By: Gretta Cool, BSN, RN, CWS, Kim RN, BSN Entered By: Gretta Cool, BSN, RN, CWS, Kim on 07/27/2019 11:05:17 [name], [place] ([id number]) -------------------------------------------------------------------------------- Problem List Details Patient Name: [name] Date of Service: 07/27/2019 11:00 AM Medical Record Number: [id number] Patient Account Number: [id number] Date of Birth/Sex: 05-13-1930 (86 y.o. F) Treating RN: Cornell Barman Primary Care Provider: Emily Filbert Other Clinician: Referring Provider: Emily Filbert Treating Provider/Extender: Tito Dine in Treatment: 14 Active Problems ICD-10 Evaluated Encounter Code Description Active Date Today Diagnosis E11.52 Type 2 diabetes mellitus with diabetic peripheral angiopathy with gangrene 04/20/2019 No Yes E11.42 Type 2 diabetes mellitus with diabetic polyneuropathy 04/20/2019 No Yes L97.528 Non-pressure chronic ulcer of other part of left foot with other specified 04/20/2019 No Yes severity E11.621 Type 2 diabetes mellitus with foot ulcer 04/20/2019 No Yes L97.518 Non-pressure chronic ulcer of other part of right foot with other specified 04/20/2019 No Yes severity Inactive Problems Resolved Problems Electronic Signature(s) Signed: 07/27/2019 5:07:28 PM By: Linton Ham MD Entered By: Linton Ham on 07/27/2019 11:08:58 [place], [place] ([id number]) -------------------------------------------------------------------------------- Progress Note Details Patient Name: [name] Date of Service: 07/27/2019 11:00 AM Medical Record Number: [id number] Patient Account Number: [id number] Date of Birth/Sex: Jul 24, 1930 (86 y.o. F) Treating RN: Cornell Barman Primary Care Provider: Emily Filbert Other Clinician: Referring Provider: Emily Filbert Treating Provider/Extender: Tito Dine in Treatment: 14 Subjective History of Present Illness  (HPI) ADMISSION 04/20/2019 This is an 86 year old woman who lives in the independent part of [place]. She is a type II diabetic on oral agents and insulin with a recent hemoglobin A1c of 6.3 on 10/6. She was initially seen by primary care on 11/12 after dropping a heavy walker on her bilateral dorsal feet. She had blisters on the right and left lateral foot. She was given doxycycline. She was seen the next day in [place] clinic on 11/13 continued on the.C. Bilateral foot x-rays were negative. She was admitted to hospital from 11/17 through 11/27 with acute kidney injury I did not research this although the patient tells me that at discharge her kidney function had returned to normal. During the course of this hospitalization it was noted she had wounds on her bilateral feet and her peripheral pulses were nonpalpable. She was evaluated by Dr. Delana Meyer. It was recommended that she consider angiography on the right leg and it was unlikely that she would heal the right leg as her ABI here was 0.48 with monophasic waveforms on the left her ABI was 1.19 with biphasic waveforms. One would wonder if the ABI was artificially elevated on the left. In any case Dr. Cydney Ok note accurately describes the patient's current state of mind. She does not want to rush into an intervention and she is here for our review of her wounds. Noteworthy that when she arrived I think her renal insufficiency was felt to be prerenal. Her GFR on arrival was 22 now 36. Past medical history includes type 2 diabetes with peripheral neuropathy, atrial fibrillation, renal insufficiency We did not calculate ABIs in our clinic this was 0.48 on the right and 1.19 on the left as noted above. Monophasic waveforms  on the right biphasic on the left 12/16; patient admitted to the clinic last week with trauma critical limb ischemia on the right. Today her wound on the left dorsal foot is healed. She still has the black eschar on the dorsal  right foot that we have been using Santyl on. There is apparently some drainage. She arrives in clinic today with more swelling on the right great and the left dorsal surrounding her foot wounds. Some erythema especially on the right. There is some tenderness on the right but not as much as I might expect if this was all infection nevertheless he does have some neuropathy which might be blunting discomfort. She reports no additional trauma. She is at the rehab section of [facility], she lives in the independent part of [place]. She tells me she has some pain in the right leg at night which wakes her up from sleeping although she manages to get back to sleep. She has about 150 foot exercise tolerance and then gets fatigue in her legs. Again I discussed the issue of angiogram with the patient especially on the right. She just will not agree to this. She states she wants the rehab and then go to [facility] where a list of her specialists. She was on Levaquin last week we did not prescribe this but she is finished. She describes some nausea and loose stools but does not sound dramatically unwell with this Left dorsal foot had some very superficial areas today I think related to swelling. The big problem is on the dorsal right foot black surface eschar with increased swelling. She does not have any pain or tenderness although she has neuropathy. We have been using Santyl. She has PAD and again I think needs an angiogram. 12/30; 2-week follow-up. The areas on the right dorsal foot. Everything is healed on the left. She is still in the rehab part of [place]. She went to see Dr. Lucky Cowboy on 12/29. He reiterated to her he felt that she needed angiography on the right leg for critical limb ischemia. She seemed to have 2 major concerns that is wanting to see a nephrologist prior to procedure in consultation. She does not currently have a nephrologist. She also had no confidence in the original arterial studies that  were done in the hospital and wanted them redone. I do not think Dr. Lucky Cowboy felt that they needed to be redone and wanted to proceed with angiography. At the end of it he simply stated that he would be glad to do the angiogram at any time the patient agreed to proceed I looked briefly at her kidney function which had normalized by the time she left the hospital in late November. I do not know that the patient has any additional concern at this point other than the reduction in GFR associated with her diabetes and normal aging. 1/27; I have not seen this patient in almost 4 weeks. She was apparently isolated in the [facility] facility because of COVID-19. She tells me she was mildly symptomatic but feels better now although she still feels like she is in a "fog" as far as her mental abilities. She states that she is up and walking for short distances with a walker. She would like to go back to her independent apartment. Still using Santyl to the wound The patient is not describing a lot of pain. She does not describe claudication but her activity sounds very limited especially when she was under isolation. She is  still reluctant to go forward with the angiogram that was suggested. Currently she wants to recover fully from Covid infection 2/10; 2-week follow-up. Patient arrives with 100% nonviable surface over the wound on her right dorsal foot. I changed her to [facility] from [place] last week in terms of surface the wound is deteriorated although I think the surface area is about the same. She is not describing claudication walking about 50 feet The patient tells me she is trying to get in with her primary doctor however there is restrictions with regards to the pandemic. She wants to be referred to nephrology and then to vascular surgery at [facility] I cannot help her with this. I did tell her that her creatinine had normalized via the last lab work I could see done at [facility] in November and her estimated  GFR was 53 at that time. By my estimation careful application of contrast dye and Dills, Teona ([id number]) preangiogram hydration should be able to get her through the necessary angiography with no complication however she simply does not want to hear this and she is going to put this off until she can get through the [place] system 2/24; 2-week follow-up wound surface area is about the same certainly not worse. She has 100% slough cover we have been using Santyl 3/3; 2-week follow-up. The patient's wound is essentially unchanged. The surface has slough on it no debridement it certainly does not look perfused. She is worried about the erythema she is having in her foot. She had a cramp in her leg last night but is not complaining of any other type of pain. Is also is concerned about some edema that is not well corrected by the diuretic she is on 3/16; 2-week follow-up. The wound is unchanged in appearance and in size. The patient has 100% nonviable surface. She saw Dr. Bonnee Quin on 07/21/2019 vascular at [facility]. His statement was "chronic limb threatening ischemia". She was actually planned for a right leg arteriogram and intervention today however she states this was postponed and they are supposed to call her this afternoon to reschedule. Her dressing is being changed at [facility] Objective Constitutional Patient is hypertensive.. Pulse regular and within target range for patient.Marland Kitchen Respirations regular, non-labored and within target range.. Temperature is normal and within the target range for the patient.Marland Kitchen appears in no distress. Vitals Time Taken: 10:55 AM, Height: 69 in, Weight: 195 lbs, BMI: 28.8, Temperature: 98.3 F, Pulse: 56 bpm, Respiratory Rate: 18 breaths/min, Blood Pressure: 171/63 mmHg. Cardiovascular Nonpalpable on the right. Forefoot is cool.. General Notes: Wound exam; completely nonviable surface 100% covered. Right dorsal foot. No palpable pulses her forefoot is cool.  Fortunately no evidence of infection no erythema no tenderness Integumentary (Hair, Skin) Wound #2 status is Open. Original cause of wound was Trauma. The wound is located on the Right,Dorsal Foot. The wound measures 4cm length x 3.5cm width x 0.2cm depth; 10.996cm^2 area and 2.199cm^3 volume. There is Fat Layer (Subcutaneous Tissue) Exposed exposed. There is no tunneling or undermining noted. There is a medium amount of serosanguineous drainage noted. The wound margin is thickened. There is small (1-33%) red granulation within the wound bed. There is a large (67-100%) amount of necrotic tissue within the wound bed including Adherent Slough. Assessment Active Problems ICD-10 Type 2 diabetes mellitus with diabetic peripheral angiopathy with gangrene Type 2 diabetes mellitus with diabetic polyneuropathy Non-pressure chronic ulcer of other part of left foot with other specified severity Type 2 diabetes mellitus with foot  ulcer Non-pressure chronic ulcer of other part of right foot with other specified severity Procedures Wound #2 Pre-procedure diagnosis of Wound #2 is a Cellulitis located on the Right,Dorsal Foot . There was a Chemical/Enzymatic/Mechanical debridement performed by Ricard Dillon, MD. With the following instrument(s): tongue blade after achieving pain control using Lidocaine. Agent used was [employer]. A time out was conducted at 11:06, prior to the start of the procedure. There was no bleeding. The procedure was tolerated well. Post Debridement Measurements: 4cm length x 3.5cm width x 0.2cm depth; 2.199cm^3 volume. Character of Wound/Ulcer Post Debridement is stable. Kemp, Leslye ([id number]) Post procedure Diagnosis Wound #2: Same as Pre-Procedure Plan Wound Cleansing: Wound #2 Right,Dorsal Foot: Clean wound with Normal Saline. Anesthetic (add to Medication List): Wound #2 Right,Dorsal Foot: Topical Lidocaine 4% cream applied to wound bed prior to debridement (In Clinic  Only). Primary Wound Dressing: Wound #2 Right,Dorsal Foot: Santyl Ointment Secondary Dressing: Wound #2 Right,Dorsal Foot: Boardered Foam Dressing Dressing Change Frequency: Wound #2 Right,Dorsal Foot: Change dressing every day. Follow-up Appointments: Wound #2 Right,Dorsal Foot: Return Appointment in 2 weeks. Edema Control: Wound #2 Right,Dorsal Foot: Elevate legs to the level of the heart and pump ankles as often as possible Medications-please add to medication list.: Wound #2 Right,Dorsal Foot: Santyl Enzymatic Ointment - Continue 1. I am going to continue with Santyl. 2. I am hopeful the arteriogram will be rescheduled in short order. 3. If they are successful in revascularizing then I think we can move ahead with aggressive mechanical debridement perhaps alternative dressings. She has her dressing changed at [facility]. Electronic Signature(s) Signed: 07/27/2019 5:07:28 PM By: Linton Ham MD Entered By: Linton Ham on 07/27/2019 11:14:01 [place], [place] ([id number]) -------------------------------------------------------------------------------- SuperBill Details Patient Name: [name] Date of Service: 07/27/2019 Medical Record Number: [id number] Patient Account Number: [id number] Date of Birth/Sex: 06/13/1930 (86 y.o. 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 E11.621 Type 2 diabetes mellitus with foot ulcer L97.518 Non-pressure chronic ulcer of other part of right foot with other specified severity Facility Procedures CPT4 Code:  [id number] Description: [id number] - DEBRIDE W/O ANES NON SELECT Modifier: Quantity: 1 Physician Procedures CPT4 Code: [id number] Description: [id number] - WC PHYS LEVEL 3 - EST PT Modifier: Quantity: 1 CPT4 Code: Description: ICD-10 Diagnosis Description L97.518 Non-pressure chronic ulcer of other part of right foot with other specified 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

## 2019-08-03 ENCOUNTER — Encounter: Payer: Medicare Other | Admitting: Internal Medicine

## 2019-08-04 ENCOUNTER — Other Ambulatory Visit: Payer: Self-pay

## 2019-08-04 ENCOUNTER — Encounter: Payer: Medicare Other | Admitting: Physician Assistant

## 2019-08-04 DIAGNOSIS — E11621 Type 2 diabetes mellitus with foot ulcer: Secondary | ICD-10-CM | POA: Diagnosis not present

## 2019-08-04 NOTE — Progress Notes (Signed)
[place], [place] ([id number]) Visit Report for 08/04/2019 Arrival Information Details Patient Name: [name], [name] [facility] Date of Service: 08/04/2019 12:45 PM Medical Record Number: [id number] Patient Account Number: [id number] Date of Birth/Sex: [birth year] (86 y.o. F) Treating RN: Army Melia Primary Care Vrishank Moster: Emily Filbert Other Clinician: Referring Jaymari Cromie: Emily Filbert Treating Lorraine Terriquez/Extender: Melburn Hake, HOYT Weeks in Treatment: 15 Visit Information History Since Last Visit Added or deleted any medications: No Patient Arrived: Walker Any new allergies or adverse reactions: No Arrival Time: 12:39 Had a fall or experienced change in No Accompanied By: self activities of daily living that may affect Transfer Assistance: None risk of falls: Patient Identification Verified: Yes Signs or symptoms of abuse/neglect since last visito No Secondary Verification Process Completed: Yes Hospitalized since last visit: No Patient Has Alerts: Yes Implantable device outside of the clinic excluding No Patient Alerts: Patient on Blood Thinner cellular tissue based products placed in the center ABI L 1.19 R 0.48 03/2019 since last visit: 2-325mg  Aspirin daily Has Dressing in Place as Prescribed: Yes TBI R .13 L .42 Pain Present Now: No Electronic Signature(s) Signed: 08/04/2019 12:44:53 PM By: Montey Hora Entered By: Montey Hora on 08/04/2019 12:44:53 [name], [name] ([id number]) -------------------------------------------------------------------------------- Clinic Level of Care Assessment Details Patient Name: [name] Date of Service: 08/04/2019 12:45 PM Medical Record Number: [id number] Patient Account Number: [id number] Date of Birth/Sex: 1930-10-02 (86 y.o. F) Treating RN: Army Melia Primary Care Detravion Tester: Emily Filbert Other Clinician: Referring Ardine Iacovelli: Emily Filbert Treating Onyx Edgley/Extender: Melburn Hake, HOYT Weeks in Treatment: 15 Clinic Level of Care Assessment Items TOOL 4  Quantity Score []  - Use when only an EandM is performed on FOLLOW-UP visit 0 ASSESSMENTS - Nursing Assessment / Reassessment X - Reassessment of Co-morbidities (includes updates in patient status) 1 10 X- 1 5 Reassessment of Adherence to Treatment Plan ASSESSMENTS - Wound and Skin Assessment / Reassessment X - Simple Wound Assessment / Reassessment - one wound 1 5 []  - 0 Complex Wound Assessment / Reassessment - multiple wounds []  - 0 Dermatologic / Skin Assessment (not related to wound area) ASSESSMENTS - Focused Assessment []  - Circumferential Edema Measurements - multi extremities 0 []  - 0 Nutritional Assessment / Counseling / Intervention []  - 0 Lower Extremity Assessment (monofilament, tuning fork, pulses) []  - 0 Peripheral Arterial Disease Assessment (using hand held doppler) ASSESSMENTS - Ostomy and/or Continence Assessment and Care []  - Incontinence Assessment and Management 0 []  - 0 Ostomy Care Assessment and Management (repouching, etc.) PROCESS - Coordination of Care X - Simple Patient / Family Education for ongoing care 1 15 []  - 0 Complex (extensive) Patient / Family Education for ongoing care X- 1 10 Staff obtains Programmer, systems, Records, Test Results / Process Orders []  - 0 Staff telephones HHA, Nursing Homes / Clarify orders / etc []  - 0 Routine Transfer to another Facility (non-emergent condition) []  - 0 Routine Hospital Admission (non-emergent condition) []  - 0 New Admissions / Biomedical engineer / Ordering NPWT, Apligraf, etc. []  - 0 Emergency Hospital Admission (emergent condition) X- 1 10 Simple Discharge Coordination []  - 0 Complex (extensive) Discharge Coordination PROCESS - Special Needs []  - Pediatric / Minor Patient Management 0 []  - 0 Isolation Patient Management []  - 0 Hearing / Language / Visual special needs []  - 0 Assessment of Community assistance (transportation, D/C planning, etc.) [name], [name] ([id number]) []  - 0 Additional  assistance / Altered mentation []  - 0 Support Surface(s) Assessment (bed, cushion, seat, etc.) INTERVENTIONS - Wound Cleansing / Measurement X - Simple Wound Cleansing -  one wound 1 5 []  - 0 Complex Wound Cleansing - multiple wounds X- 1 5 Wound Imaging (photographs - any number of wounds) []  - 0 Wound Tracing (instead of photographs) X- 1 5 Simple Wound Measurement - one wound []  - 0 Complex Wound Measurement - multiple wounds INTERVENTIONS - Wound Dressings []  - Small Wound Dressing one or multiple wounds 0 X- 1 15 Medium Wound Dressing one or multiple wounds []  - 0 Large Wound Dressing one or multiple wounds []  - 0 Application of Medications - topical []  - 0 Application of Medications - injection INTERVENTIONS - Miscellaneous []  - External ear exam 0 []  - 0 Specimen Collection (cultures, biopsies, blood, body fluids, etc.) []  - 0 Specimen(s) / Culture(s) sent or taken to Lab for analysis []  - 0 Patient Transfer (multiple staff / Civil Service fast streamer / Similar devices) []  - 0 Simple Staple / Suture removal (25 or less) []  - 0 Complex Staple / Suture removal (26 or more) []  - 0 Hypo / Hyperglycemic Management (close monitor of Blood Glucose) []  - 0 Ankle / Brachial Index (ABI) - do not check if billed separately X- 1 5 Vital Signs Has the patient been seen at the hospital within the last three years: Yes Total Score: 90 Level Of Care: New/Established - Level 3 Electronic Signature(s) Signed: 08/04/2019 3:40:54 PM By: Army Melia Entered By: Army Melia on 08/04/2019 13:06:47 [name], [name] ([id number]) -------------------------------------------------------------------------------- Encounter Discharge Information Details Patient Name: [name] Date of Service: 08/04/2019 12:45 PM Medical Record Number: [id number] Patient Account Number: [id number] Date of Birth/Sex: August 11, 1930 (86 y.o. F) Treating RN: Army Melia Primary Care Elzia Hott: Emily Filbert Other  Clinician: Referring Kali Ambler: Emily Filbert Treating Daemian Gahm/Extender: Melburn Hake, HOYT Weeks in Treatment: 15 Encounter Discharge Information Items Discharge Condition: Stable Ambulatory Status: Walker Discharge Destination: Skilled Nursing Facility Telephoned: No Orders Sent: Yes Transportation: Private Auto Accompanied By: self Schedule Follow-up Appointment: Yes Clinical Summary of Care: Electronic Signature(s) Signed: 08/04/2019 3:40:54 PM By: Army Melia Entered By: Army Melia on 08/04/2019 13:07:31 [place], [place] ([id number]) -------------------------------------------------------------------------------- Lower Extremity Assessment Details Patient Name: [name] Date of Service: 08/04/2019 12:45 PM Medical Record Number: [id number] Patient Account Number: [id number] Date of Birth/Sex: 08-01-1930 (86 y.o. F) Treating RN: Montey Hora Primary Care Lilly Gasser: Emily Filbert Other Clinician: Referring Shanesha Bednarz: Emily Filbert Treating Ford Peddie/Extender: Melburn Hake, HOYT Weeks in Treatment: 15 Edema Assessment Assessed: [Left: No] [Right: No] Edema: [Left: Yes] [Right: Yes] Vascular Assessment Blood Pressure: Brachial: [Left:168] [Right:158] Dorsalis Pedis: 109 [Left:Dorsalis Pedis: [id number] Ankle: Posterior Tibial: 129 [Left:Posterior Tibial: 117 0.77] [Right:0.83] Notes ABIs from [facility] 07/21/2019 with TBI R .13 L .42 Electronic Signature(s) Signed: 08/04/2019 12:45:14 PM By: Montey Hora Previous Signature: 08/04/2019 12:43:23 PM Version By: Montey Hora Entered By: Montey Hora on 08/04/2019 12:45:13 [name], [name] ([id number]) -------------------------------------------------------------------------------- Multi Wound Chart Details Patient Name: [name] Date of Service: 08/04/2019 12:45 PM Medical Record Number: [id number] Patient Account Number: [id number] Date of Birth/Sex: 15-Jan-1931 (86 y.o. F) Treating RN: Army Melia Primary Care Tolbert Matheson: Emily Filbert  Other Clinician: Referring Anieya Helman: Emily Filbert Treating Hannahgrace Lalli/Extender: Melburn Hake, HOYT Weeks in Treatment: 15 Vital Signs Height(in): 69 Pulse(bpm): 51 Weight(lbs): 195 Blood Pressure(mmHg): 161/57 Body Mass Index(BMI): 29 Temperature(F): 98.5 Respiratory Rate(breaths/min): 18 Photos: [N/A:N/A] Wound Location: Right Foot - Dorsal N/A N/A Wounding Event: Trauma N/A N/A Primary Etiology: Cellulitis N/A N/A Comorbid History: Cataracts, Arrhythmia, Congestive N/A N/A Heart Failure, Hypertension, Type II Diabetes, Rheumatoid Arthritis Date Acquired: 03/24/2019 N/A N/A Weeks of Treatment: 15 N/A N/A Wound  Status: Open N/A N/A Measurements L x W x D (cm) 4.1x3.4x0.2 N/A N/A Area (cm) : 10.948 N/A N/A Volume (cm) : 2.19 N/A N/A % Reduction in Area: -66.00% N/A N/A % Reduction in Volume: -231.80% N/A N/A Classification: Full Thickness Without Exposed N/A N/A Support Structures Exudate Amount: Medium N/A N/A Exudate Type: Serosanguineous N/A N/A Exudate Color: red, brown N/A N/A Wound Margin: Thickened N/A N/A Granulation Amount: Small (1-33%) N/A N/A Granulation Quality: Red N/A N/A Necrotic Amount: Large (67-100%) N/A N/A Exposed Structures: Fat Layer (Subcutaneous Tissue) N/A N/A Exposed: Yes Fascia: No Tendon: No Muscle: No Joint: No Bone: No Epithelialization: None N/A N/A Treatment Notes Electronic Signature(s) Signed: 08/04/2019 3:40:54 PM By: Collene Gobble, Marni ([id number]) Entered By: Army Melia on 08/04/2019 13:04:00 [name], [name] ([id number]) -------------------------------------------------------------------------------- [place] Details Patient Name: [name] Date of Service: 08/04/2019 12:45 PM Medical Record Number: [id number] Patient Account Number: [id number] Date of Birth/Sex: Mar 18, 1931 (86 y.o. F) Treating RN: Army Melia Primary Care Emrik Erhard: Emily Filbert Other Clinician: Referring Braelyn Bordonaro: Emily Filbert Treating Arlynn Stare/Extender: Melburn Hake, HOYT Weeks in Treatment: 15 Active Inactive Necrotic Tissue Nursing Diagnoses: Impaired tissue integrity related to necrotic/devitalized tissue Knowledge deficit related to management of necrotic/devitalized tissue Goals: Necrotic/devitalized tissue will be minimized in the wound bed Date Initiated: 04/20/2019 Target Resolution Date: 05/04/2019 Goal Status: Active Interventions: Assess patient pain level pre-, during and post procedure and prior to discharge Provide education on necrotic tissue and debridement process Treatment Activities: Apply topical anesthetic as ordered : 04/20/2019 Notes: Venous Leg Ulcer Nursing Diagnoses: Actual venous Insuffiency (use after diagnosis is confirmed) Goals: Patient/caregiver will verbalize understanding of disease process and disease management Date Initiated: 04/20/2019 Target Resolution Date: 05/04/2019 Goal Status: Active Interventions: Assess peripheral edema status every visit. Notes: Wound/Skin Impairment Nursing Diagnoses: Impaired tissue integrity Goals: Patient/caregiver will verbalize understanding of skin care regimen Date Initiated: 04/20/2019 Target Resolution Date: 05/04/2019 Goal Status: Active Ulcer/skin breakdown will have a volume reduction of 30% by week 4 Date Initiated: 04/20/2019 Target Resolution Date: 05/18/2019 Goal Status: Active Interventions: Assess ulceration(s) every visit [name], [name] ([id number]) Treatment Activities: Skin care regimen initiated : 04/20/2019 Topical wound management initiated : 04/20/2019 Notes: Electronic Signature(s) Signed: 08/04/2019 3:40:54 PM By: Army Melia Entered By: Army Melia on 08/04/2019 13:03:40 [name], [name] ([id number]) -------------------------------------------------------------------------------- Pain Assessment Details Patient Name: [name] Date of Service: 08/04/2019 12:45 PM Medical Record Number:  [id number] Patient Account Number: [id number] Date of Birth/Sex: [birth year] (86 y.o. F) Treating RN: Montey Hora Primary Care Greycen Felter: Emily Filbert Other Clinician: Referring Nazire Fruth: Emily Filbert Treating Josh Nicolosi/Extender: Melburn Hake, HOYT Weeks in Treatment: 15 Active Problems Location of Pain Severity and Description of Pain Patient Has Paino No Site Locations Pain Management and Medication Current Pain Management: Electronic Signature(s) Signed: 08/04/2019 4:59:02 PM By: Montey Hora Entered By: Montey Hora on 08/04/2019 12:48:35 [name], [name] ([id number]) -------------------------------------------------------------------------------- Patient/Caregiver Education Details Patient Name: [name] Date of Service: 08/04/2019 12:45 PM Medical Record Number: [id number] Patient Account Number: [id number] Date of Birth/Gender: [birth year] (86 y.o. F) Treating RN: Army Melia Primary Care Physician: Emily Filbert Other Clinician: Referring Physician: Emily Filbert Treating Physician/Extender: Sharalyn Ink in Treatment: 15 Education Assessment Education Provided To: Patient Education Topics Provided Wound/Skin Impairment: Handouts: Caring for Your Ulcer Methods: Demonstration, Explain/Verbal Responses: State content correctly Electronic Signature(s) Signed: 08/04/2019 3:40:54 PM By: Army Melia Entered By: Army Melia on 08/04/2019 13:06:59 [name], [name] ([id number]) -------------------------------------------------------------------------------- Wound Assessment Details Patient Name: [name] Date of Service: 08/04/2019 12:45 PM  Medical Record Number: [id number] Patient Account Number: [id number] Date of Birth/Sex: 05/24/1930 (86 y.o. F) Treating RN: Montey Hora Primary Care Daryus Sowash: Emily Filbert Other Clinician: Referring Elisabetta Mishra: Emily Filbert Treating Jodean Valade/Extender: Melburn Hake, HOYT Weeks in Treatment: 15 Wound Status Wound Number: 2 Primary  Cellulitis Etiology: Wound Location: Right Foot - Dorsal Wound Open Wounding Event: Trauma Status: Date Acquired: 03/24/2019 Comorbid Cataracts, Arrhythmia, Congestive Heart Failure, Weeks Of Treatment: 15 History: Hypertension, Type II Diabetes, Rheumatoid Arthritis Clustered Wound: No Photos Wound Measurements Length: (cm) 4.1 Width: (cm) 3.4 Depth: (cm) 0.2 Area: (cm) 10.948 Volume: (cm) 2.19 % Reduction in Area: -66% % Reduction in Volume: -231.8% Epithelialization: None Tunneling: No Undermining: No Wound Description Classification: Full Thickness Without Exposed Support Structu Wound Margin: Thickened Exudate Amount: Medium Exudate Type: Serosanguineous Exudate Color: red, brown res Foul Odor After Cleansing: No Slough/Fibrino Yes Wound Bed Granulation Amount: Small (1-33%) Exposed Structure Granulation Quality: Red Fascia Exposed: No Necrotic Amount: Large (67-100%) Fat Layer (Subcutaneous Tissue) Exposed: Yes Necrotic Quality: Adherent Slough Tendon Exposed: No Muscle Exposed: No Joint Exposed: No Bone Exposed: No Treatment Notes Wound #2 (Right, Dorsal Foot) Notes Santyl, Bordered Foam dressing to secure Electronic Signature(s) Signed: 08/04/2019 4:59:02 PM By: Teola Bradley, [name] ([id number]) Entered By: Montey Hora on 08/04/2019 12:50:05 Och, Emanii ([id number]) -------------------------------------------------------------------------------- Vitals Details Patient Name: [name] Date of Service: 08/04/2019 12:45 PM Medical Record Number: [id number] Patient Account Number: [id number] Date of Birth/Sex: October 13, 1930 (86 y.o. F) Treating RN: Army Melia Primary Care Sevyn Markham: Emily Filbert Other Clinician: Referring Elky Funches: Emily Filbert Treating Debrah Granderson/Extender: Melburn Hake, HOYT Weeks in Treatment: 15 Vital Signs Time Taken: 12:40 Temperature (F): 98.5 Height (in): 69 Pulse (bpm): 51 Weight (lbs): 195 Respiratory Rate  (breaths/min): 18 Body Mass Index (BMI): 28.8 Blood Pressure (mmHg): 161/57 Reference Range: 80 - 120 mg / dl Electronic Signature(s) Signed: 08/04/2019 4:15:53 PM By: Lorine Bears RCP, RRT, CHT Entered By: Lorine Bears on 08/04/2019 12:43:06

## 2019-08-04 NOTE — Progress Notes (Addendum)
[place], [place] ([id number]) Visit Report for 08/04/2019 Chief Complaint Document Details Patient Name: [name], [name] [facility] Date of Service: 08/04/2019 12:45 PM Medical Record Number: [id number] Patient Account Number: [id number] Date of Birth/Sex: [birth year] (86 y.o. F) Treating RN: Army Melia Primary Care Provider: Emily Filbert Other Clinician: Referring Provider: Emily Filbert Treating Provider/Extender: Melburn Hake, Cerys Winget Weeks in Treatment: 15 Information Obtained from: Patient Chief Complaint 04/20/2019; patient is here for review of wounds on her bilateral dorsal feet Electronic Signature(s) Signed: 08/04/2019 1:01:59 PM By: Worthy Keeler PA-C Entered By: Worthy Keeler on 08/04/2019 13:01:59 [place], [place] ([id number]) -------------------------------------------------------------------------------- HPI Details Patient Name: [name] Date of Service: 08/04/2019 12:45 PM Medical Record Number: [id number] Patient Account Number: [id number] Date of Birth/Sex: 08/24/1930 (86 y.o. F) Treating RN: Army Melia Primary Care Provider: Emily Filbert Other Clinician: Referring Provider: Emily Filbert Treating Provider/Extender: Melburn Hake, Dontavion Noxon Weeks in Treatment: 15 History of Present Illness HPI Description: ADMISSION 04/20/2019 This is an 86 year old woman who lives in the independent part of [place]. She is a type II diabetic on oral agents and insulin with a recent hemoglobin A1c of 6.3 on 10/6. She was initially seen by primary care on 11/12 after dropping a heavy walker on her bilateral dorsal feet. She had blisters on the right and left lateral foot. She was given doxycycline. She was seen the next day in [place] clinic on 11/13 continued on the.C. Bilateral foot x-rays were negative. She was admitted to hospital from 11/17 through 11/27 with acute kidney injury I did not research this although the patient tells me that at discharge her kidney function had returned to normal. During the  course of this hospitalization it was noted she had wounds on her bilateral feet and her peripheral pulses were nonpalpable. She was evaluated by Dr. Delana Meyer. It was recommended that she consider angiography on the right leg and it was unlikely that she would heal the right leg as her ABI here was 0.48 with monophasic waveforms on the left her ABI was 1.19 with biphasic waveforms. One would wonder if the ABI was artificially elevated on the left. In any case Dr. Cydney Ok note accurately describes the patient's current state of mind. She does not want to rush into an intervention and she is here for our review of her wounds. Noteworthy that when she arrived I think her renal insufficiency was felt to be prerenal. Her GFR on arrival was 22 now 36. Past medical history includes type 2 diabetes with peripheral neuropathy, atrial fibrillation, renal insufficiency We did not calculate ABIs in our clinic this was 0.48 on the right and 1.19 on the left as noted above. Monophasic waveforms on the right biphasic on the left 12/16; patient admitted to the clinic last week with trauma critical limb ischemia on the right. Today her wound on the left dorsal foot is healed. She still has the black eschar on the dorsal right foot that we have been using Santyl on. There is apparently some drainage. She arrives in clinic today with more swelling on the right great and the left dorsal surrounding her foot wounds. Some erythema especially on the right. There is some tenderness on the right but not as much as I might expect if this was all infection nevertheless he does have some neuropathy which might be blunting discomfort. She reports no additional trauma. She is at the rehab section of [facility], she lives in the independent part of [place]. She tells me she has some pain in  the right leg at night which wakes her up from sleeping although she manages to get back to sleep. She has about 150 foot exercise tolerance  and then gets fatigue in her legs. Again I discussed the issue of angiogram with the patient especially on the right. She just will not agree to this. She states she wants the rehab and then go to [facility] where a list of her specialists. She was on Levaquin last week we did not prescribe this but she is finished. She describes some nausea and loose stools but does not sound dramatically unwell with this Left dorsal foot had some very superficial areas today I think related to swelling. The big problem is on the dorsal right foot black surface eschar with increased swelling. She does not have any pain or tenderness although she has neuropathy. We have been using Santyl. She has PAD and again I think needs an angiogram. 12/30; 2-week follow-up. The areas on the right dorsal foot. Everything is healed on the left. She is still in the rehab part of [place]. She went to see Dr. Lucky Cowboy on 12/29. He reiterated to her he felt that she needed angiography on the right leg for critical limb ischemia. She seemed to have 2 major concerns that is wanting to see a nephrologist prior to procedure in consultation. She does not currently have a nephrologist. She also had no confidence in the original arterial studies that were done in the hospital and wanted them redone. I do not think Dr. Lucky Cowboy felt that they needed to be redone and wanted to proceed with angiography. At the end of it he simply stated that he would be glad to do the angiogram at any time the patient agreed to proceed I looked briefly at her kidney function which had normalized by the time she left the hospital in late November. I do not know that the patient has any additional concern at this point other than the reduction in GFR associated with her diabetes and normal aging. 1/27; I have not seen this patient in almost 4 weeks. She was apparently isolated in the [facility] facility because of COVID-19. She tells me she was mildly symptomatic but feels  better now although she still feels like she is in a "fog" as far as her mental abilities. She states that she is up and walking for short distances with a walker. She would like to go back to her independent apartment. Still using Santyl to the wound The patient is not describing a lot of pain. She does not describe claudication but her activity sounds very limited especially when she was under isolation. She is still reluctant to go forward with the angiogram that was suggested. Currently she wants to recover fully from Covid infection 2/10; 2-week follow-up. Patient arrives with 100% nonviable surface over the wound on her right dorsal foot. I changed her to [facility] from [place] last week in terms of surface the wound is deteriorated although I think the surface area is about the same. She is not describing claudication walking about 50 feet The patient tells me she is trying to get in with her primary doctor however there is restrictions with regards to the pandemic. She wants to be referred to nephrology and then to vascular surgery at [facility] I cannot help her with this. I did tell her that her creatinine had normalized via the last lab work I could see done at [facility] in November and her estimated GFR was  53 at that time. By my estimation careful application of contrast dye and Lofstrom, [place] ([id number]) preangiogram hydration should be able to get her through the necessary angiography with no complication however she simply does not want to hear this and she is going to put this off until she can get through the [place] system 2/24; 2-week follow-up wound surface area is about the same certainly not worse. She has 100% slough cover we have been using Santyl 3/3; 2-week follow-up. The patient's wound is essentially unchanged. The surface has slough on it no debridement it certainly does not look perfused. She is worried about the erythema she is having in her foot. She had a cramp in her leg last  night but is not complaining of any other type of pain. Is also is concerned about some edema that is not well corrected by the diuretic she is on 3/16; 2-week follow-up. The wound is unchanged in appearance and in size. The patient has 100% nonviable surface. She saw Dr. Bonnee Quin on 07/21/2019 vascular at [facility]. His statement was "chronic limb threatening ischemia". She was actually planned for a right leg arteriogram and intervention today however she states this was postponed and they are supposed to call her this afternoon to reschedule. Her dressing is being changed at [facility] 08/04/2019 upon evaluation today patient has had her evaluation with vascular at [facility]. Subsequently she unfortunately does not have good arterial flow into her bilateral lower extremities. She actually is doing worse on the left as compared to the right but neither is effectively receiving good blood flow from the heart down into the lower extremity. She tells me that she actually has her dressing changed on a regular basis at [facility]. She is scheduled apparently to have intervention tomorrow in regard to her right lower extremity and then they will proceed from there in regard to the left depend on how things go. Electronic Signature(s) Signed: 08/04/2019 1:43:21 PM By: Worthy Keeler PA-C Entered By: Worthy Keeler on 08/04/2019 13:43:20 Miklos, [place] ([id number]) -------------------------------------------------------------------------------- Physical Exam Details Patient Name: [name] Date of Service: 08/04/2019 12:45 PM Medical Record Number: [id number] Patient Account Number: [id number] Date of Birth/Sex: Sep 10, 1930 (86 y.o. F) Treating RN: Army Melia Primary Care Provider: Emily Filbert Other Clinician: Referring Provider: Emily Filbert Treating Provider/Extender: Melburn Hake, Bastien Strawser Weeks in Treatment: 62 Constitutional Well-nourished and well-hydrated in no acute distress. Respiratory normal  breathing without difficulty. Psychiatric this patient is able to make decisions and demonstrates good insight into disease process. Alert and Oriented x 3. pleasant and cooperative. Notes Upon inspection patient's wound actually showed signs of slough buildup on the surface of the wound. Fortunately there is no evidence of active infection which is good news. No fevers, chills, nausea, vomiting, or diarrhea.She is continue to use Santyl at this point. Electronic Signature(s) Signed: 08/04/2019 1:46:15 PM By: Worthy Keeler PA-C Entered By: Worthy Keeler on 08/04/2019 13:46:14 [place], [place] ([id number]) -------------------------------------------------------------------------------- Physician Orders Details Patient Name: [name] Date of Service: 08/04/2019 12:45 PM Medical Record Number: [id number] Patient Account Number: [id number] Date of Birth/Sex: [birth year] (86 y.o. F) Treating RN: Army Melia Primary Care Provider: Emily Filbert Other Clinician: Referring Provider: Emily Filbert Treating Provider/Extender: Melburn Hake, Zaire Vanbuskirk Weeks in Treatment: 15 Verbal / Phone Orders: No Diagnosis Coding ICD-10 Coding Code Description E11.52 Type 2 diabetes mellitus with diabetic peripheral angiopathy with gangrene E11.42 Type 2 diabetes mellitus with diabetic polyneuropathy L97.528 Non-pressure chronic ulcer of other part of left foot  with other specified severity E11.621 Type 2 diabetes mellitus with foot ulcer L97.518 Non-pressure chronic ulcer of other part of right foot with other specified severity Wound Cleansing Wound #2 Right,Dorsal Foot o Clean wound with Normal Saline. Anesthetic (add to Medication List) Wound #2 Right,Dorsal Foot o Topical Lidocaine 4% cream applied to wound bed prior to debridement (In Clinic Only). Primary Wound Dressing Wound #2 Right,Dorsal Foot o Santyl Ointment Secondary Dressing Wound #2 Right,Dorsal Foot o Boardered Foam Dressing Dressing  Change Frequency Wound #2 Right,Dorsal Foot o Change dressing every day. Follow-up Appointments Wound #2 Right,Dorsal Foot o Return Appointment in 1 week. Edema Control Wound #2 Right,Dorsal Foot o Elevate legs to the level of the heart and pump ankles as often as possible Medications-please add to medication list. Wound #2 Right,Dorsal Foot o Santyl Enzymatic Ointment - Continue Electronic Signature(s) Signed: 08/04/2019 3:40:54 PM By: Army Melia Signed: 08/04/2019 4:59:35 PM By: Worthy Keeler PA-C Entered By: Army Melia on 08/04/2019 13:05:47 [place], [place] ([id number]) -------------------------------------------------------------------------------- Problem List Details Patient Name: [name] Date of Service: 08/04/2019 12:45 PM Medical Record Number: [id number] Patient Account Number: [id number] Date of Birth/Sex: 10-23-1930 (86 y.o. F) Treating RN: Army Melia Primary Care Provider: Emily Filbert Other Clinician: Referring Provider: Emily Filbert Treating Provider/Extender: Melburn Hake, Arvo Ealy Weeks in Treatment: 15 Active Problems ICD-10 Evaluated Encounter Code Description Active Date Today Diagnosis E11.52 Type 2 diabetes mellitus with diabetic peripheral angiopathy with gangrene 04/20/2019 No Yes E11.42 Type 2 diabetes mellitus with diabetic polyneuropathy 04/20/2019 No Yes L97.528 Non-pressure chronic ulcer of other part of left foot with other specified 04/20/2019 No Yes severity E11.621 Type 2 diabetes mellitus with foot ulcer 04/20/2019 No Yes L97.518 Non-pressure chronic ulcer of other part of right foot with other specified 04/20/2019 No Yes severity Inactive Problems Resolved Problems Electronic Signature(s) Signed: 08/04/2019 1:01:48 PM By: Worthy Keeler PA-C Entered By: Worthy Keeler on 08/04/2019 13:01:47 [place], [place] ([id number]) -------------------------------------------------------------------------------- Progress Note Details Patient Name:  [name] Date of Service: 08/04/2019 12:45 PM Medical Record Number: [id number] Patient Account Number: [id number] Date of Birth/Sex: Mar 16, 1931 (86 y.o. F) Treating RN: Army Melia Primary Care Provider: Emily Filbert Other Clinician: Referring Provider: Emily Filbert Treating Provider/Extender: Melburn Hake, Adriane Guglielmo Weeks in Treatment: 15 Subjective Chief Complaint Information obtained from Patient 04/20/2019; patient is here for review of wounds on her bilateral dorsal feet History of Present Illness (HPI) ADMISSION 04/20/2019 This is an 86 year old woman who lives in the independent part of [place]. She is a type II diabetic on oral agents and insulin with a recent hemoglobin A1c of 6.3 on 10/6. She was initially seen by primary care on 11/12 after dropping a heavy walker on her bilateral dorsal feet. She had blisters on the right and left lateral foot. She was given doxycycline. She was seen the next day in [place] clinic on 11/13 continued on the.C. Bilateral foot x-rays were negative. She was admitted to hospital from 11/17 through 11/27 with acute kidney injury I did not research this although the patient tells me that at discharge her kidney function had returned to normal. During the course of this hospitalization it was noted she had wounds on her bilateral feet and her peripheral pulses were nonpalpable. She was evaluated by Dr. Delana Meyer. It was recommended that she consider angiography on the right leg and it was unlikely that she would heal the right leg as her ABI here was 0.48 with monophasic waveforms on the left her ABI was 1.19 with biphasic  waveforms. One would wonder if the ABI was artificially elevated on the left. In any case Dr. Cydney Ok note accurately describes the patient's current state of mind. She does not want to rush into an intervention and she is here for our review of her wounds. Noteworthy that when she arrived I think her renal insufficiency was felt to be  prerenal. Her GFR on arrival was 22 now 36. Past medical history includes type 2 diabetes with peripheral neuropathy, atrial fibrillation, renal insufficiency We did not calculate ABIs in our clinic this was 0.48 on the right and 1.19 on the left as noted above. Monophasic waveforms on the right biphasic on the left 12/16; patient admitted to the clinic last week with trauma critical limb ischemia on the right. Today her wound on the left dorsal foot is healed. She still has the black eschar on the dorsal right foot that we have been using Santyl on. There is apparently some drainage. She arrives in clinic today with more swelling on the right great and the left dorsal surrounding her foot wounds. Some erythema especially on the right. There is some tenderness on the right but not as much as I might expect if this was all infection nevertheless he does have some neuropathy which might be blunting discomfort. She reports no additional trauma. She is at the rehab section of [facility], she lives in the independent part of [place]. She tells me she has some pain in the right leg at night which wakes her up from sleeping although she manages to get back to sleep. She has about 150 foot exercise tolerance and then gets fatigue in her legs. Again I discussed the issue of angiogram with the patient especially on the right. She just will not agree to this. She states she wants the rehab and then go to [facility] where a list of her specialists. She was on Levaquin last week we did not prescribe this but she is finished. She describes some nausea and loose stools but does not sound dramatically unwell with this Left dorsal foot had some very superficial areas today I think related to swelling. The big problem is on the dorsal right foot black surface eschar with increased swelling. She does not have any pain or tenderness although she has neuropathy. We have been using Santyl. She has PAD and again I think needs  an angiogram. 12/30; 2-week follow-up. The areas on the right dorsal foot. Everything is healed on the left. She is still in the rehab part of [place]. She went to see Dr. Lucky Cowboy on 12/29. He reiterated to her he felt that she needed angiography on the right leg for critical limb ischemia. She seemed to have 2 major concerns that is wanting to see a nephrologist prior to procedure in consultation. She does not currently have a nephrologist. She also had no confidence in the original arterial studies that were done in the hospital and wanted them redone. I do not think Dr. Lucky Cowboy felt that they needed to be redone and wanted to proceed with angiography. At the end of it he simply stated that he would be glad to do the angiogram at any time the patient agreed to proceed I looked briefly at her kidney function which had normalized by the time she left the hospital in late November. I do not know that the patient has any additional concern at this point other than the reduction in GFR associated with her diabetes and normal aging. 1/27;  I have not seen this patient in almost 4 weeks. She was apparently isolated in the [facility] facility because of COVID-19. She tells me she was mildly symptomatic but feels better now although she still feels like she is in a "fog" as far as her mental abilities. She states that she is up and walking for short distances with a walker. She would like to go back to her independent apartment. Still using Santyl to the wound The patient is not describing a lot of pain. She does not describe claudication but her activity sounds very limited especially when she was under isolation. She is still reluctant to go forward with the angiogram that was suggested. Currently she wants to recover fully from Covid infection 2/10; 2-week follow-up. Patient arrives with 100% nonviable surface over the wound on her right dorsal foot. I changed her to [facility] from [place] last week in  terms of surface the wound is deteriorated although I think the surface area is about the same. She is not describing claudication Toner, Jakaila ([id number]) walking about 50 feet The patient tells me she is trying to get in with her primary doctor however there is restrictions with regards to the pandemic. She wants to be referred to nephrology and then to vascular surgery at [facility] I cannot help her with this. I did tell her that her creatinine had normalized via the last lab work I could see done at [facility] in November and her estimated GFR was 53 at that time. By my estimation careful application of contrast dye and preangiogram hydration should be able to get her through the necessary angiography with no complication however she simply does not want to hear this and she is going to put this off until she can get through the [place] system 2/24; 2-week follow-up wound surface area is about the same certainly not worse. She has 100% slough cover we have been using Santyl 3/3; 2-week follow-up. The patient's wound is essentially unchanged. The surface has slough on it no debridement it certainly does not look perfused. She is worried about the erythema she is having in her foot. She had a cramp in her leg last night but is not complaining of any other type of pain. Is also is concerned about some edema that is not well corrected by the diuretic she is on 3/16; 2-week follow-up. The wound is unchanged in appearance and in size. The patient has 100% nonviable surface. She saw Dr. Bonnee Quin on 07/21/2019 vascular at [facility]. His statement was "chronic limb threatening ischemia". She was actually planned for a right leg arteriogram and intervention today however she states this was postponed and they are supposed to call her this afternoon to reschedule. Her dressing is being changed at [facility] 08/04/2019 upon evaluation today patient has had her evaluation with vascular at [facility]. Subsequently she  unfortunately does not have good arterial flow into her bilateral lower extremities. She actually is doing worse on the left as compared to the right but neither is effectively receiving good blood flow from the heart down into the lower extremity. She tells me that she actually has her dressing changed on a regular basis at [facility]. She is scheduled apparently to have intervention tomorrow in regard to her right lower extremity and then they will proceed from there in regard to the left depend on how things go. Objective Constitutional Well-nourished and well-hydrated in no acute distress. Vitals Time Taken: 12:40 PM, Height: 69 in, Weight: 195  lbs, BMI: 28.8, Temperature: 98.5 F, Pulse: 51 bpm, Respiratory Rate: 18 breaths/min, Blood Pressure: 161/57 mmHg. Respiratory normal breathing without difficulty. Psychiatric this patient is able to make decisions and demonstrates good insight into disease process. Alert and Oriented x 3. pleasant and cooperative. General Notes: Upon inspection patient's wound actually showed signs of slough buildup on the surface of the wound. Fortunately there is no evidence of active infection which is good news. No fevers, chills, nausea, vomiting, or diarrhea.She is continue to use Santyl at this point. Integumentary (Hair, Skin) Wound #2 status is Open. Original cause of wound was Trauma. The wound is located on the Right,Dorsal Foot. The wound measures 4.1cm length x 3.4cm width x 0.2cm depth; 10.948cm^2 area and 2.19cm^3 volume. There is Fat Layer (Subcutaneous Tissue) Exposed exposed. There is no tunneling or undermining noted. There is a medium amount of serosanguineous drainage noted. The wound margin is thickened. There is small (1-33%) red granulation within the wound bed. There is a large (67-100%) amount of necrotic tissue within the wound bed including Adherent Slough. Assessment Active Problems ICD-10 Type 2 diabetes mellitus with diabetic  peripheral angiopathy with gangrene Type 2 diabetes mellitus with diabetic polyneuropathy Non-pressure chronic ulcer of other part of left foot with other specified severity Type 2 diabetes mellitus with foot ulcer Non-pressure chronic ulcer of other part of right foot with other specified severity Pellman, Dulcy ([id number]) Plan Wound Cleansing: Wound #2 Right,Dorsal Foot: Clean wound with Normal Saline. Anesthetic (add to Medication List): Wound #2 Right,Dorsal Foot: Topical Lidocaine 4% cream applied to wound bed prior to debridement (In Clinic Only). Primary Wound Dressing: Wound #2 Right,Dorsal Foot: Santyl Ointment Secondary Dressing: Wound #2 Right,Dorsal Foot: Boardered Foam Dressing Dressing Change Frequency: Wound #2 Right,Dorsal Foot: Change dressing every day. Follow-up Appointments: Wound #2 Right,Dorsal Foot: Return Appointment in 1 week. Edema Control: Wound #2 Right,Dorsal Foot: Elevate legs to the level of the heart and pump ankles as often as possible Medications-please add to medication list.: Wound #2 Right,Dorsal Foot: Santyl Enzymatic Ointment - Continue 1. I would recommend currently that we continue with the wound care measures as before utilizing Santyl as the dressing of choice for the patient. She is in agreement with that plan. 2. I am also can recommend at this time that she continue to elevate her legs much as possible. 3. She is actually scheduled to go to [facility] tomorrow apparently for her vascular intervention for the right lower extremity. I think that is definitely appropriate. She would like to try to see about making an appointment to be seen next week since she is having a procedure tomorrow I am okay with seeing her next week as well and then we can get her back on a normal schedule with Dr. Dellia Nims. We will see patient back for reevaluation in 1 week here in the clinic. If anything worsens or changes patient will contact our office for  additional recommendations. Electronic Signature(s) Signed: 08/04/2019 1:47:08 PM By: Worthy Keeler PA-C Entered By: Worthy Keeler on 08/04/2019 13:47:07 Raske, Lalaine ([id number]) -------------------------------------------------------------------------------- SuperBill Details Patient Name: [name] Date of Service: 08/04/2019 Medical Record Number: [id number] Patient Account Number: [id number] Date of Birth/Sex: [birth year] (86 y.o. 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  L97.528 Non-pressure chronic ulcer of other part of left foot with other specified severity E11.621 Type 2 diabetes mellitus with foot ulcer L97.518 Non-pressure chronic ulcer of other part of right foot with other specified severity Facility Procedures CPT4 Code: [id number] Description: 99213 - WOUND CARE VISIT-LEV 3 EST PT Modifier: Quantity: 1 Physician Procedures CPT4 Code: [id number] Description: [id number] - WC PHYS LEVEL 3 - EST PT Modifier: Quantity: 1 CPT4 Code: Description: ICD-10 Diagnosis Description 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

## 2019-08-11 ENCOUNTER — Other Ambulatory Visit: Payer: Self-pay

## 2019-08-11 ENCOUNTER — Encounter: Payer: Medicare Other | Admitting: Physician Assistant

## 2019-08-11 ENCOUNTER — Encounter: Payer: Medicare Other | Attending: Physician Assistant

## 2019-08-11 DIAGNOSIS — Z794 Long term (current) use of insulin: Secondary | ICD-10-CM | POA: Diagnosis not present

## 2019-08-11 DIAGNOSIS — L97528 Non-pressure chronic ulcer of other part of left foot with other specified severity: Secondary | ICD-10-CM | POA: Insufficient documentation

## 2019-08-11 DIAGNOSIS — E1152 Type 2 diabetes mellitus with diabetic peripheral angiopathy with gangrene: Secondary | ICD-10-CM | POA: Diagnosis not present

## 2019-08-11 DIAGNOSIS — I110 Hypertensive heart disease with heart failure: Secondary | ICD-10-CM | POA: Insufficient documentation

## 2019-08-11 DIAGNOSIS — E11621 Type 2 diabetes mellitus with foot ulcer: Secondary | ICD-10-CM | POA: Insufficient documentation

## 2019-08-11 DIAGNOSIS — L97518 Non-pressure chronic ulcer of other part of right foot with other specified severity: Secondary | ICD-10-CM | POA: Insufficient documentation

## 2019-08-11 DIAGNOSIS — E1142 Type 2 diabetes mellitus with diabetic polyneuropathy: Secondary | ICD-10-CM | POA: Diagnosis not present

## 2019-08-11 DIAGNOSIS — I509 Heart failure, unspecified: Secondary | ICD-10-CM | POA: Diagnosis not present

## 2019-08-11 DIAGNOSIS — I4891 Unspecified atrial fibrillation: Secondary | ICD-10-CM | POA: Insufficient documentation

## 2019-08-12 NOTE — Progress Notes (Signed)
[place], [place] ([id number]) Visit Report for 08/11/2019 Arrival Information Details Patient Name: Midori, [facility] Date of Service: 08/11/2019 11:30 AM Medical Record Number: [id number] Patient Account Number: [id number] Date of Birth/Sex: 1930-10-07 (86 y.o. F) Treating RN: Cornell Barman Primary Care Gabrielle Mester: Emily Filbert Other Clinician: Referring Jennea Rager: Emily Filbert Treating Aodhan Scheidt/Extender: Melburn Hake, HOYT Weeks in Treatment: 16 Visit Information History Since Last Visit Added or deleted any medications: Yes Patient Arrived: Walker Pain Present Now: No Arrival Time: 11:25 Accompanied By: self Transfer Assistance: None Patient Identification Verified: Yes Secondary Verification Process Completed: Yes Patient Has Alerts: Yes Patient Alerts: Patient on Blood Thinner ABI L 1.19 R 0.48 03/2019 2-325mg  Aspirin daily TBI R .13 L .42 Electronic Signature(s) Signed: 08/11/2019 5:42:19 PM By: Gretta Cool, BSN, RN, CWS, Kim RN, BSN Entered By: Gretta Cool, BSN, RN, CWS, Kim on 08/11/2019 11:26:51 [name], [place] ([id number]) -------------------------------------------------------------------------------- Clinic Level of Care Assessment Details Patient Name: [name] Date of Service: 08/11/2019 11:30 AM Medical Record Number: [id number] Patient Account Number: [id number] Date of Birth/Sex: 03/04/1931 (86 y.o. F) Treating RN: Cornell Barman Primary Care Omaya Nieland: Emily Filbert Other Clinician: Referring Nejla Reasor: Emily Filbert Treating Kris Burd/Extender: Melburn Hake, HOYT Weeks in Treatment: 16 Clinic Level of Care Assessment Items TOOL 4 Quantity Score []  - Use when only an EandM is performed on FOLLOW-UP visit 0 ASSESSMENTS - Nursing Assessment / Reassessment []  - Reassessment of Co-morbidities (includes updates in patient status) 0 []  - 0 Reassessment of Adherence to Treatment Plan ASSESSMENTS - Wound and Skin Assessment / Reassessment []  - Simple Wound Assessment / Reassessment - one wound 0 []  - 0 Complex  Wound Assessment / Reassessment - multiple wounds []  - 0 Dermatologic / Skin Assessment (not related to wound area) ASSESSMENTS - Focused Assessment []  - Circumferential Edema Measurements - multi extremities 0 []  - 0 Nutritional Assessment / Counseling / Intervention []  - 0 Lower Extremity Assessment (monofilament, tuning fork, pulses) []  - 0 Peripheral Arterial Disease Assessment (using hand held doppler) ASSESSMENTS - Ostomy and/or Continence Assessment and Care []  - Incontinence Assessment and Management 0 []  - 0 Ostomy Care Assessment and Management (repouching, etc.) PROCESS - Coordination of Care []  - Simple Patient / Family Education for ongoing care 0 []  - 0 Complex (extensive) Patient / Family Education for ongoing care []  - 0 Staff obtains Programmer, systems, Records, Test Results / Process Orders []  - 0 Staff telephones HHA, Nursing Homes / Clarify orders / etc []  - 0 Routine Transfer to another Facility (non-emergent condition) []  - 0 Routine Hospital Admission (non-emergent condition) []  - 0 New Admissions / Biomedical engineer / Ordering NPWT, Apligraf, etc. []  - 0 Emergency Hospital Admission (emergent condition) []  - 0 Simple Discharge Coordination []  - 0 Complex (extensive) Discharge Coordination PROCESS - Special Needs []  - Pediatric / Minor Patient Management 0 []  - 0 Isolation Patient Management []  - 0 Hearing / Language / Visual special needs []  - 0 Assessment of Community assistance (transportation, D/C planning, etc.) Pakula, Karne ([id number]) []  - 0 Additional assistance / Altered mentation []  - 0 Support Surface(s) Assessment (bed, cushion, seat, etc.) INTERVENTIONS - Wound Cleansing / Measurement []  - Simple Wound Cleansing - one wound 0 []  - 0 Complex Wound Cleansing - multiple wounds []  - 0 Wound Imaging (photographs - any number of wounds) []  - 0 Wound Tracing (instead of photographs) []  - 0 Simple Wound Measurement - one wound []  -  0 Complex Wound Measurement - multiple wounds INTERVENTIONS - Wound Dressings []  - Small Wound Dressing one  or multiple wounds 0 []  - 0 Medium Wound Dressing one or multiple wounds []  - 0 Large Wound Dressing one or multiple wounds []  - 0 Application of Medications - topical []  - 0 Application of Medications - injection INTERVENTIONS - Miscellaneous []  - External ear exam 0 []  - 0 Specimen Collection (cultures, biopsies, blood, body fluids, etc.) []  - 0 Specimen(s) / Culture(s) sent or taken to Lab for analysis []  - 0 Patient Transfer (multiple staff / Harrel Lemon Lift / Similar devices) []  - 0 Simple Staple / Suture removal (25 or less) []  - 0 Complex Staple / Suture removal (26 or more) []  - 0 Hypo / Hyperglycemic Management (close monitor of Blood Glucose) []  - 0 Ankle / Brachial Index (ABI) - do not check if billed separately X- 1 5 Vital Signs Has the patient been seen at the hospital within the last three years: Yes Total Score: 5 Level Of Care: New/Established - Level 1 Electronic Signature(s) Signed: 08/11/2019 5:42:19 PM By: Gretta Cool, BSN, RN, CWS, Kim RN, BSN Entered By: Gretta Cool, BSN, RN, CWS, Kim on 08/11/2019 11:54:41 [name], [place] ([id number]) -------------------------------------------------------------------------------- General Visit Notes Details Patient Name: [name] Date of Service: 08/11/2019 11:30 AM Medical Record Number: [id number] Patient Account Number: [id number] Date of Birth/Sex: 23-Oct-1930 (86 y.o. F) Treating RN: Cornell Barman Primary Care Denym Christenberry: Emily Filbert Other Clinician: Referring Nalleli Largent: Emily Filbert Treating Eshaal Duby/Extender: Melburn Hake, HOYT Weeks in Treatment: 16 Notes Patient arrived to her visit today stating she has had a surgical debridement 08/05/2019 at [facility]. Patient in complaining of vision, hearing and overall fogginess, she states from the anesthesia. I have spoken with our PA and have explained to the patient that she is  under a a surgical global period and she needs to follow-up with her surgeon regarding her concerns. Patient understands and agrees and will follow-up with her surgeon. Electronic Signature(s) Signed: 08/11/2019 11:50:58 AM By: Gretta Cool, BSN, RN, CWS, Kim RN, BSN Previous Signature: 08/11/2019 11:49:52 AM Version By: Gretta Cool, BSN, RN, CWS, Kim RN, BSN Entered By: Gretta Cool, BSN, RN, CWS, Kim on 08/11/2019 11:50:57 [name], [place] ([id number]) -------------------------------------------------------------------------------- [place] Details Patient Name: [name] Date of Service: 08/11/2019 11:30 AM Medical Record Number: [id number] Patient Account Number: [id number] Date of Birth/Sex: [birth year] (86 y.o. F) Treating RN: Cornell Barman Primary Care Aveon Colquhoun: Emily Filbert Other Clinician: Referring Armando Bukhari: Emily Filbert Treating Miguel Christiana/Extender: Melburn Hake, HOYT Weeks in Treatment: 16 Vital Signs Time Taken: 10:26 Temperature (F): 97.7 Height (in): 69 Pulse (bpm): 53 Weight (lbs): 195 Respiratory Rate (breaths/min): 18 Body Mass Index (BMI): 28.8 Blood Pressure (mmHg): 161/56 Reference Range: 80 - 120 mg / dl Electronic Signature(s) Signed: 08/11/2019 5:42:19 PM By: Gretta Cool, BSN, RN, CWS, Kim RN, BSN Entered By: Gretta Cool, BSN, RN, CWS, Kim on 08/11/2019 12:00:29

## 2019-08-17 ENCOUNTER — Ambulatory Visit: Payer: PRIVATE HEALTH INSURANCE | Admitting: Internal Medicine

## 2019-08-24 ENCOUNTER — Encounter: Payer: Medicare Other | Admitting: Internal Medicine

## 2019-08-24 ENCOUNTER — Other Ambulatory Visit: Payer: Self-pay

## 2019-08-24 DIAGNOSIS — E11621 Type 2 diabetes mellitus with foot ulcer: Secondary | ICD-10-CM | POA: Diagnosis not present

## 2019-08-24 NOTE — Progress Notes (Signed)
[place], Arieliz ([id number]) Visit Report for 08/24/2019 Debridement Details Patient Name: [name], [name] [facility] Date of Service: 08/24/2019 10:15 AM Medical Record Number: [id number] Patient Account Number: [id number] Date of Birth/Sex: 01-06-1931 (86 y.o. F) Treating RN: Cornell Barman Primary Care Provider: Emily Filbert Other Clinician: Referring Provider: Emily Filbert Treating Provider/Extender: Tito Dine in Treatment: 18 Debridement Performed for Wound #2 Right,Dorsal Foot Assessment: Performed By: Physician Ricard Dillon, MD Debridement Type: Debridement Level of Consciousness (Pre- Awake and Alert procedure): Pre-procedure Verification/Time Out Yes - 10:23 Taken: Start Time: 10:23 Pain Control: Lidocaine Total Area Debrided (L x W): 3.9 (cm) x 3.5 (cm) = 13.65 (cm) Tissue and other material debrided: Viable, Non-Viable, Slough, Subcutaneous, Slough Level: Skin/Subcutaneous Tissue Debridement Description: Excisional Instrument: Curette Bleeding: Minimum Hemostasis Achieved: Pressure End Time: 10:25 Response to Treatment: Procedure was tolerated well Level of Consciousness (Post- Awake and Alert procedure): Post Debridement Measurements of Total Wound Length: (cm) 3.9 Width: (cm) 3.5 Depth: (cm) 0.3 Volume: (cm) 3.216 Character of Wound/Ulcer Post Debridement: Stable Post Procedure Diagnosis Same as Pre-procedure Electronic Signature(s) Signed: 08/24/2019 11:11:47 AM By: Gretta Cool, BSN, RN, CWS, Kim RN, BSN Signed: 08/24/2019 4:08:23 PM By: Linton Ham MD Entered By: Linton Ham on 08/24/2019 11:04:02 Rembold, Ysabel ([id number]) -------------------------------------------------------------------------------- HPI Details Patient Name: [name] Date of Service: 08/24/2019 10:15 AM Medical Record Number: [id number] Patient Account Number: [id number] Date of Birth/Sex: 07/11/1930 (86 y.o. F) Treating RN: Cornell Barman Primary Care Provider: Emily Filbert Other  Clinician: Referring Provider: Emily Filbert Treating Provider/Extender: Tito Dine in Treatment: 18 History of Present Illness HPI Description: ADMISSION 04/20/2019 This is an 86 year old woman who lives in the independent part of [place]. She is a type II diabetic on oral agents and insulin with a recent hemoglobin A1c of 6.3 on 10/6. She was initially seen by primary care on 11/12 after dropping a heavy walker on her bilateral dorsal feet. She had blisters on the right and left lateral foot. She was given doxycycline. She was seen the next day in [place] clinic on 11/13 continued on the.C. Bilateral foot x-rays were negative. She was admitted to hospital from 11/17 through 11/27 with acute kidney injury I did not research this although the patient tells me that at discharge her kidney function had returned to normal. During the course of this hospitalization it was noted she had wounds on her bilateral feet and her peripheral pulses were nonpalpable. She was evaluated by Dr. Delana Meyer. It was recommended that she consider angiography on the right leg and it was unlikely that she would heal the right leg as her ABI here was 0.48 with monophasic waveforms on the left her ABI was 1.19 with biphasic waveforms. One would wonder if the ABI was artificially elevated on the left. In any case Dr. Cydney Ok note accurately describes the patient's current state of mind. She does not want to rush into an intervention and she is here for our review of her wounds. Noteworthy that when she arrived I think her renal insufficiency was felt to be prerenal. Her GFR on arrival was 22 now 36. Past medical history includes type 2 diabetes with peripheral neuropathy, atrial fibrillation, renal insufficiency We did not calculate ABIs in our clinic this was 0.48 on the right and 1.19 on the left as noted above. Monophasic waveforms on the right biphasic on the left 12/16; patient admitted to the clinic  last week with trauma critical limb ischemia on the right. Today her wound on the left dorsal  foot is healed. She still has the black eschar on the dorsal right foot that we have been using Santyl on. There is apparently some drainage. She arrives in clinic today with more swelling on the right great and the left dorsal surrounding her foot wounds. Some erythema especially on the right. There is some tenderness on the right but not as much as I might expect if this was all infection nevertheless he does have some neuropathy which might be blunting discomfort. She reports no additional trauma. She is at the rehab section of [facility], she lives in the independent part of [place]. She tells me she has some pain in the right leg at night which wakes her up from sleeping although she manages to get back to sleep. She has about 150 foot exercise tolerance and then gets fatigue in her legs. Again I discussed the issue of angiogram with the patient especially on the right. She just will not agree to this. She states she wants the rehab and then go to [facility] where a list of her specialists. She was on Levaquin last week we did not prescribe this but she is finished. She describes some nausea and loose stools but does not sound dramatically unwell with this Left dorsal foot had some very superficial areas today I think related to swelling. The big problem is on the dorsal right foot black surface eschar with increased swelling. She does not have any pain or tenderness although she has neuropathy. We have been using Santyl. She has PAD and again I think needs an angiogram. 12/30; 2-week follow-up. The areas on the right dorsal foot. Everything is healed on the left. She is still in the rehab part of [place]. She went to see Dr. Lucky Cowboy on 12/29. He reiterated to her he felt that she needed angiography on the right leg for critical limb ischemia. She seemed to have 2 major concerns that is wanting to see a  nephrologist prior to procedure in consultation. She does not currently have a nephrologist. She also had no confidence in the original arterial studies that were done in the hospital and wanted them redone. I do not think Dr. Lucky Cowboy felt that they needed to be redone and wanted to proceed with angiography. At the end of it he simply stated that he would be glad to do the angiogram at any time the patient agreed to proceed I looked briefly at her kidney function which had normalized by the time she left the hospital in late November. I do not know that the patient has any additional concern at this point other than the reduction in GFR associated with her diabetes and normal aging. 1/27; I have not seen this patient in almost 4 weeks. She was apparently isolated in the [facility] facility because of COVID-19. She tells me she was mildly symptomatic but feels better now although she still feels like she is in a "fog" as far as her mental abilities. She states that she is up and walking for short distances with a walker. She would like to go back to her independent apartment. Still using Santyl to the wound The patient is not describing a lot of pain. She does not describe claudication but her activity sounds very limited especially when she was under isolation. She is still reluctant to go forward with the angiogram that was suggested. Currently she wants to recover fully from Covid infection 2/10; 2-week follow-up. Patient arrives with 100% nonviable surface over the  wound on her right dorsal foot. I changed her to [facility] from [place] last week in terms of surface the wound is deteriorated although I think the surface area is about the same. She is not describing claudication walking about 50 feet The patient tells me she is trying to get in with her primary doctor however there is restrictions with regards to the pandemic. She wants to be referred to nephrology and then to vascular surgery at  [facility] I cannot help her with this. I did tell her that her creatinine had normalized via the last lab work I could see done at [facility] in November and her estimated GFR was 53 at that time. By my estimation careful application of contrast dye and Israelson, Paisely ([id number]) preangiogram hydration should be able to get her through the necessary angiography with no complication however she simply does not want to hear this and she is going to put this off until she can get through the [place] system 2/24; 2-week follow-up wound surface area is about the same certainly not worse. She has 100% slough cover we have been using Santyl 3/3; 2-week follow-up. The patient's wound is essentially unchanged. The surface has slough on it no debridement it certainly does not look perfused. She is worried about the erythema she is having in her foot. She had a cramp in her leg last night but is not complaining of any other type of pain. Is also is concerned about some edema that is not well corrected by the diuretic she is on 3/16; 2-week follow-up. The wound is unchanged in appearance and in size. The patient has 100% nonviable surface. She saw Dr. Bonnee Quin on 07/21/2019 vascular at [facility]. His statement was "chronic limb threatening ischemia". She was actually planned for a right leg arteriogram and intervention today however she states this was postponed and they are supposed to call her this afternoon to reschedule. Her dressing is being changed at [facility] 08/04/2019 upon evaluation today patient has had her evaluation with vascular at [facility]. Subsequently she unfortunately does not have good arterial flow into her bilateral lower extremities. She actually is doing worse on the left as compared to the right but neither is effectively receiving good blood flow from the heart down into the lower extremity. She tells me that she actually has her dressing changed on a regular basis at [facility]. She is scheduled  apparently to have intervention tomorrow in regard to her right lower extremity and then they will proceed from there in regard to the left depend on how things go. 4/14; the patient was admitted to [facility] from 3/26 through 08/07/2019. She underwent an aortogram ultimately underwent a right popliteal orbital atherectomy and angioplasty, a right anterior tibial artery orbital atherectomy and angioplasty and a right dorsalis pedis artery angioplasty. She was kept in hospital for hydration secondary to a mild bump in her creatinine. I see she was back in the vascular clinic complaining of fatigue but this was not felt to be secondary to procedure which was not done under general anesthesia. She was apparently using Med[employer], the nurse at [facility] is changing the dressing Electronic Signature(s) Signed: 08/24/2019 4:08:23 PM By: Linton Ham MD Entered By: Linton Ham on 08/24/2019 11:11:30 [name], Mellany ([id number]) -------------------------------------------------------------------------------- Physical Exam Details Patient Name: [name] Date of Service: 08/24/2019 10:15 AM Medical Record Number: [id number] Patient Account Number: [id number] Date of Birth/Sex: [birth year] (86 y.o. F) Treating RN: Cornell Barman Primary Care Provider: Emily Filbert Other  Clinician: Referring Provider: Emily Filbert Treating Provider/Extender: Tito Dine in Treatment: 18 Constitutional Patient is hypertensive.. Pulse regular and within target range for patient.Marland Kitchen Respirations regular, non-labored and within target range.. Temperature is normal and within the target range for the patient.Marland Kitchen appears in no distress. Respiratory Respiratory effort is easy and symmetric bilaterally. Rate is normal at rest and on room air.. Cardiovascular Soft systolic murmur no S3. JVP not elevated. Given through the swelling on her foot I could feel her pulses. She has bilateral lower extremity edema which is pitting  extending into her dorsal foot. Integumentary (Hair, Skin) No evidence of infection. Notes Wound exam; 100% covered with tightly adherent necrotic debris. Debrided with a #5 curette there was brisk bleeding which was gratifying to see in this case. I think her pedal pulses are much better and she has a palpable dorsalis pedis and her foot is warm. This is quite a bit of an improvement. She does have significant lower extremity edema which is just in the distal lower extremities. The exact cause of this is unclear. Electronic Signature(s) Signed: 08/24/2019 4:08:23 PM By: Linton Ham MD Entered By: Linton Ham on 08/24/2019 11:21:58 Kearley, Dewayne ([id number]) -------------------------------------------------------------------------------- Physician Orders Details Patient Name: [name] Date of Service: 08/24/2019 10:15 AM Medical Record Number: [id number] Patient Account Number: [id number] Date of Birth/Sex: [birth year] (86 y.o. F) Treating RN: Cornell Barman Primary Care Provider: Emily Filbert Other Clinician: Referring Provider: Emily Filbert Treating Provider/Extender: Tito Dine in Treatment: 41 Verbal / Phone Orders: No Diagnosis Coding Wound Cleansing Wound #2 Right,Dorsal Foot o Clean wound with Normal Saline. Anesthetic (add to Medication List) Wound #2 Right,Dorsal Foot o Topical Lidocaine 4% cream applied to wound bed prior to debridement (In Clinic Only). Skin Barriers/Peri-Wound Care Wound #2 Right,Dorsal Foot o Moisturizing lotion Primary Wound Dressing Wound #2 Right,Dorsal Foot o Iodoflex Secondary Dressing Wound #2 Right,Dorsal Foot o ABD pad Dressing Change Frequency Wound #2 Right,Dorsal Foot o Change Dressing Monday, Wednesday, Friday Follow-up Appointments Wound #2 Right,Dorsal Foot o Return Appointment in 1 week. Edema Control Wound #2 Right,Dorsal Foot o Kerlix and Coban - Right Lower Extremity o Elevate legs to the  level of the heart and pump ankles as often as possible Notes Twin Loss adjuster, chartered) Signed: 08/24/2019 11:11:47 AM By: Gretta Cool, BSN, RN, CWS, Kim RN, BSN Signed: 08/24/2019 4:08:23 PM By: Linton Ham MD Entered By: Gretta Cool, BSN, RN, CWS, Kim on 08/24/2019 10:27:53 [name], Mamie ([id number]) -------------------------------------------------------------------------------- Problem List Details Patient Name: [name] Date of Service: 08/24/2019 10:15 AM Medical Record Number: [id number] Patient Account Number: [id number] Date of Birth/Sex: 1930/05/22 (86 y.o. F) Treating RN: Cornell Barman Primary Care Provider: Emily Filbert Other Clinician: Referring Provider: Emily Filbert Treating Provider/Extender: Tito Dine in Treatment: 18 Active Problems ICD-10 Evaluated Encounter Code Description Active Date Today Diagnosis E11.52 Type 2 diabetes mellitus with diabetic peripheral angiopathy with gangrene 04/20/2019 No Yes E11.42 Type 2 diabetes mellitus with diabetic polyneuropathy 04/20/2019 No Yes L97.528 Non-pressure chronic ulcer of other part of left foot with other specified 04/20/2019 No Yes severity E11.621 Type 2 diabetes mellitus with foot ulcer 04/20/2019 No Yes L97.518 Non-pressure chronic ulcer of other part of right foot with other specified 04/20/2019 No Yes severity Inactive Problems Resolved Problems Electronic Signature(s) Signed: 08/24/2019 4:08:23 PM By: Linton Ham MD Entered By: Linton Ham on 08/24/2019 11:02:59 [place], [place] ([id number]) -------------------------------------------------------------------------------- Progress Note Details Patient Name: [name] Date of Service: 08/24/2019 10:15 AM Medical Record  Number: [id number] Patient Account Number: [id number] Date of Birth/Sex: 10-Jan-1931 (86 y.o. F) Treating RN: Cornell Barman Primary Care Provider: Emily Filbert Other Clinician: Referring Provider: Emily Filbert Treating Provider/Extender: Tito Dine in Treatment: 18 Subjective History of Present Illness (HPI) ADMISSION 04/20/2019 This is an 86 year old woman who lives in the independent part of [place]. She is a type II diabetic on oral agents and insulin with a recent hemoglobin A1c of 6.3 on 10/6. She was initially seen by primary care on 11/12 after dropping a heavy walker on her bilateral dorsal feet. She had blisters on the right and left lateral foot. She was given doxycycline. She was seen the next day in [place] clinic on 11/13 continued on the.C. Bilateral foot x-rays were negative. She was admitted to hospital from 11/17 through 11/27 with acute kidney injury I did not research this although the patient tells me that at discharge her kidney function had returned to normal. During the course of this hospitalization it was noted she had wounds on her bilateral feet and her peripheral pulses were nonpalpable. She was evaluated by Dr. Delana Meyer. It was recommended that she consider angiography on the right leg and it was unlikely that she would heal the right leg as her ABI here was 0.48 with monophasic waveforms on the left her ABI was 1.19 with biphasic waveforms. One would wonder if the ABI was artificially elevated on the left. In any case Dr. Cydney Ok note accurately describes the patient's current state of mind. She does not want to rush into an intervention and she is here for our review of her wounds. Noteworthy that when she arrived I think her renal insufficiency was felt to be prerenal. Her GFR on arrival was 22 now 36. Past medical history includes type 2 diabetes with peripheral neuropathy, atrial fibrillation, renal insufficiency We did not calculate ABIs in our clinic this was 0.48 on the right and 1.19 on the left as noted above. Monophasic waveforms on the right biphasic on the left 12/16; patient admitted to the clinic last week with trauma critical limb  ischemia on the right. Today her wound on the left dorsal foot is healed. She still has the black eschar on the dorsal right foot that we have been using Santyl on. There is apparently some drainage. She arrives in clinic today with more swelling on the right great and the left dorsal surrounding her foot wounds. Some erythema especially on the right. There is some tenderness on the right but not as much as I might expect if this was all infection nevertheless he does have some neuropathy which might be blunting discomfort. She reports no additional trauma. She is at the rehab section of [facility], she lives in the independent part of [place]. She tells me she has some pain in the right leg at night which wakes her up from sleeping although she manages to get back to sleep. She has about 150 foot exercise tolerance and then gets fatigue in her legs. Again I discussed the issue of angiogram with the patient especially on the right. She just will not agree to this. She states she wants the rehab and then go to [facility] where a list of her specialists. She was on Levaquin last week we did not prescribe this but she is finished. She describes some nausea and loose stools but does not sound dramatically unwell with this Left dorsal foot had some very superficial areas today I think related to swelling.  The big problem is on the dorsal right foot black surface eschar with increased swelling. She does not have any pain or tenderness although she has neuropathy. We have been using Santyl. She has PAD and again I think needs an angiogram. 12/30; 2-week follow-up. The areas on the right dorsal foot. Everything is healed on the left. She is still in the rehab part of [place]. She went to see Dr. Lucky Cowboy on 12/29. He reiterated to her he felt that she needed angiography on the right leg for critical limb ischemia. She seemed to have 2 major concerns that is wanting to see a nephrologist prior to procedure in  consultation. She does not currently have a nephrologist. She also had no confidence in the original arterial studies that were done in the hospital and wanted them redone. I do not think Dr. Lucky Cowboy felt that they needed to be redone and wanted to proceed with angiography. At the end of it he simply stated that he would be glad to do the angiogram at any time the patient agreed to proceed I looked briefly at her kidney function which had normalized by the time she left the hospital in late November. I do not know that the patient has any additional concern at this point other than the reduction in GFR associated with her diabetes and normal aging. 1/27; I have not seen this patient in almost 4 weeks. She was apparently isolated in the [facility] facility because of COVID-19. She tells me she was mildly symptomatic but feels better now although she still feels like she is in a "fog" as far as her mental abilities. She states that she is up and walking for short distances with a walker. She would like to go back to her independent apartment. Still using Santyl to the wound The patient is not describing a lot of pain. She does not describe claudication but her activity sounds very limited especially when she was under isolation. She is still reluctant to go forward with the angiogram that was suggested. Currently she wants to recover fully from Covid infection 2/10; 2-week follow-up. Patient arrives with 100% nonviable surface over the wound on her right dorsal foot. I changed her to [facility] from [place] last week in terms of surface the wound is deteriorated although I think the surface area is about the same. She is not describing claudication walking about 50 feet The patient tells me she is trying to get in with her primary doctor however there is restrictions with regards to the pandemic. She wants to be referred to nephrology and then to vascular surgery at [facility] I cannot help her with this. I  did tell her that her creatinine had normalized via the last lab work I could see done at [facility] in November and her estimated GFR was 53 at that time. By my estimation careful application of contrast dye and Battey, Retta ([id number]) preangiogram hydration should be able to get her through the necessary angiography with no complication however she simply does not want to hear this and she is going to put this off until she can get through the [place] system 2/24; 2-week follow-up wound surface area is about the same certainly not worse. She has 100% slough cover we have been using Santyl 3/3; 2-week follow-up. The patient's wound is essentially unchanged. The surface has slough on it no debridement it certainly does not look perfused. She is worried about the erythema she is having in her foot. She  had a cramp in her leg last night but is not complaining of any other type of pain. Is also is concerned about some edema that is not well corrected by the diuretic she is on 3/16; 2-week follow-up. The wound is unchanged in appearance and in size. The patient has 100% nonviable surface. She saw Dr. Bonnee Quin on 07/21/2019 vascular at [facility]. His statement was "chronic limb threatening ischemia". She was actually planned for a right leg arteriogram and intervention today however she states this was postponed and they are supposed to call her this afternoon to reschedule. Her dressing is being changed at [facility] 08/04/2019 upon evaluation today patient has had her evaluation with vascular at [facility]. Subsequently she unfortunately does not have good arterial flow into her bilateral lower extremities. She actually is doing worse on the left as compared to the right but neither is effectively receiving good blood flow from the heart down into the lower extremity. She tells me that she actually has her dressing changed on a regular basis at [facility]. She is scheduled apparently to have intervention tomorrow in  regard to her right lower extremity and then they will proceed from there in regard to the left depend on how things go. 4/14; the patient was admitted to [facility] from 3/26 through 08/07/2019. She underwent an aortogram ultimately underwent a right popliteal orbital atherectomy and angioplasty, a right anterior tibial artery orbital atherectomy and angioplasty and a right dorsalis pedis artery angioplasty. She was kept in hospital for hydration secondary to a mild bump in her creatinine. I see she was back in the vascular clinic complaining of fatigue but this was not felt to be secondary to procedure which was not done under general anesthesia. She was apparently using [employer], the nurse at [facility] is changing the dressing Objective Constitutional Patient is hypertensive.. Pulse regular and within target range for patient.Marland Kitchen Respirations regular, non-labored and within target range.. Temperature is normal and within the target range for the patient.Marland Kitchen appears in no distress. Vitals Time Taken: 10:05 AM, Height: 69 in, Weight: 195 lbs, BMI: 28.8, Temperature: 98.8 F, Pulse: 53 bpm, Respiratory Rate: 16 breaths/min, Blood Pressure: 145/55 mmHg. Respiratory Respiratory effort is easy and symmetric bilaterally. Rate is normal at rest and on room air.. Cardiovascular Soft systolic murmur no S3. JVP not elevated. Given through the swelling on her foot I could feel her pulses. She has bilateral lower extremity edema which is pitting extending into her dorsal foot. General Notes: Wound exam; 100% covered with tightly adherent necrotic debris. Debrided with a #5 curette there was brisk bleeding which was gratifying to see in this case. I think her pedal pulses are much better and she has a palpable dorsalis pedis and her foot is warm. This is quite a bit of an improvement. She does have significant lower extremity edema which is just in the distal lower extremities. The exact cause of this is  unclear. Integumentary (Hair, Skin) No evidence of infection. Wound #2 status is Open. Original cause of wound was Trauma. The wound is located on the Right,Dorsal Foot. The wound measures 3.9cm length x 3.5cm width x 0.2cm depth; 10.721cm^2 area and 2.144cm^3 volume. There is Fat Layer (Subcutaneous Tissue) Exposed exposed. There is no tunneling or undermining noted. There is a medium amount of serosanguineous drainage noted. The wound margin is thickened. There is small (1-33%) red granulation within the wound bed. There is a large (67-100%) amount of necrotic tissue within the wound bed including  Adherent Slough. 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 There was a Excisional Skin/Subcutaneous Tissue Debridement with a total area of 13.65 sq cm performed by Ricard Dillon, MD. With the following instrument(s): Curette to remove Viable and Non-Viable tissue/material. Material removed includes Subcutaneous Tissue and Slough and after achieving pain control using Lidocaine. No specimens were taken. A time out was conducted at 10:23, prior to the start of the procedure. A Minimum amount of bleeding was controlled with Pressure. The procedure was tolerated well. Post Debridement Measurements: 3.9cm length x 3.5cm width x 0.3cm depth; 3.216cm^3 volume. Character of Wound/Ulcer Post Debridement is stable. Post procedure Diagnosis Wound #2: Same as Pre-Procedure Plan Wound Cleansing: Wound #2 Right,Dorsal Foot: Clean wound with Normal  Saline. Anesthetic (add to Medication List): Wound #2 Right,Dorsal Foot: Topical Lidocaine 4% cream applied to wound bed prior to debridement (In Clinic Only). Skin Barriers/Peri-Wound Care: Wound #2 Right,Dorsal Foot: Moisturizing lotion Primary Wound Dressing: Wound #2 Right,Dorsal Foot: Iodoflex Secondary Dressing: Wound #2 Right,Dorsal Foot: ABD pad Dressing Change Frequency: Wound #2 Right,Dorsal Foot: Change Dressing Monday, Wednesday, Friday Follow-up Appointments: Wound #2 Right,Dorsal Foot: Return Appointment in 1 week. Edema Control: Wound #2 Right,Dorsal Foot: Kerlix and Coban - Right Lower Extremity Elevate legs to the level of the heart and pump ankles as often as possible General Notes: Twin Immunologist changes dressings 1. I change the primary dressing to Iodoflex 2. I am going to put her in kerlix Coban. 3. Asked her to keep her leg elevated and she tells me she sits in a recliner. We will change her dressing three times a week Electronic Signature(s) Signed: 08/24/2019 4:08:23 PM By: Linton Ham MD Entered By: Linton Ham on 08/24/2019 11:22:53 Kleiber, Isabellarose ([id number]) [place], [place] ([id number]) -------------------------------------------------------------------------------- SuperBill Details Patient Name: [name] Date of Service: 08/24/2019 Medical Record Number: [id number] Patient Account Number: [id number] Date of Birth/Sex: Aug 16, 1930 (86 y.o. F) Treating RN: Cornell Barman Primary Care Provider: Emily Filbert Other Clinician: Referring Provider: Emily Filbert Treating Provider/Extender: Tito Dine in Treatment: 18 Diagnosis Coding ICD-10 Codes Code Description E11.52 Type 2 diabetes mellitus with diabetic peripheral angiopathy with gangrene E11.42 Type 2 diabetes mellitus with diabetic polyneuropathy L97.528 Non-pressure chronic ulcer of other part of left foot with other specified severity E11.621 Type 2 diabetes mellitus with foot  ulcer L97.518 Non-pressure chronic ulcer of other part of right foot with other specified severity Facility Procedures CPT4 Code: [id number] Description: [id number] - DEB SUBQ TISSUE 20 SQ CM/< Modifier: Quantity: 1 CPT4 Code: Description: ICD-10 Diagnosis Description L97.518 Non-pressure chronic ulcer of other part of right foot with other specified s Modifier: everity Quantity: Physician Procedures CPT4 Code: [id number] Description: 11042 - WC PHYS SUBQ TISS 20 SQ CM Modifier: Quantity: 1 CPT4 Code: Description: ICD-10 Diagnosis Description L97.518 Non-pressure chronic ulcer of other part of right foot with other specified s Modifier: everity Quantity: Electronic Signature(s) Signed: 08/24/2019 4:08:23 PM By: Linton Ham MD Entered By: Linton Ham on 08/24/2019 11:23:39

## 2019-08-24 NOTE — Progress Notes (Signed)
[place], [place] ([id number]) Visit Report for 08/24/2019 Arrival Information Details Patient Name: [name], [name] [facility] Date of Service: 08/24/2019 10:15 AM Medical Record Number: [id number] Patient Account Number: [id number] Date of Birth/Sex: 02-Jan-1931 (86 y.o. F) Treating RN: [name] Primary Care [name]: [name] Other Clinician: Referring [name]: [name] Treating [name]/Extender: [name] in Treatment: 18 Visit Information History Since Last Visit Added or deleted any medications: Yes Patient Arrived: Walker Any new allergies or adverse reactions: No Arrival Time: 10:06 Had a fall or experienced change in No Accompanied By: self activities of daily living that may affect Transfer Assistance: None risk of falls: Patient Identification Verified: Yes Signs or symptoms of abuse/neglect since last visito No Secondary Verification Process Completed: Yes Hospitalized since last visit: No Patient Has Alerts: Yes Implantable device outside of the clinic excluding No Patient Alerts: Patient on Blood Thinner cellular tissue based products placed in the center ABI L 1.19 R 0.48 03/2019 since last visit: 2-325mg  Aspirin daily Has Dressing in Place as Prescribed: Yes TBI R .13 L .42 Pain Present Now: No Electronic Signature(s) Signed: 08/24/2019 3:16:46 PM By: [name] RCP, RRT, CHT Entered By: [name] on 08/24/2019 10:08:34 [name], [name] -------------------------------------------------------------------------------- Encounter Discharge Information Details Patient Name: [name] Date of Service: 08/24/2019 10:15 AM Medical Record Number: [id number] Patient Account Number: [id number] Date of Birth/Sex: 07-Mar-1931 (86 y.o. F) Treating RN: [name] Primary Care [name]: [name] Other Clinician: Referring [name]: [name] Treating [name]/Extender: [name] in Treatment:  52 Encounter Discharge Information Items Post Procedure Vitals Discharge Condition: Stable Temperature (F): 98.8 Ambulatory Status: Walker Pulse (bpm): 53 Discharge Destination: Home Respiratory Rate (breaths/min): 16 Transportation: Private Auto Blood Pressure (mmHg): 148/55 Accompanied By: self Schedule Follow-up Appointment: Yes Clinical Summary of Care: Electronic Signature(s) Signed: 08/24/2019 11:11:47 AM By: [name], BSN, RN, CWS, Kim RN, BSN Entered By: [name], BSN, RN, CWS, [name] on 08/24/2019 10:30:50 Tawni [name], [name] -------------------------------------------------------------------------------- Lower Extremity Assessment Details Patient Name: [name] Date of Service: 08/24/2019 10:15 AM Medical Record Number: [id number] Patient Account Number: [id number] Date of Birth/Sex: [birth year] (86 y.o. F) Treating RN: [name] Primary Care Brion Hedges: [name] Other Clinician: Referring Kasi Lasky: [name] Treating Alyxander Kollmann/Extender: [name] Weeks in Treatment: 18 Edema Assessment Assessed: [Left: No] [Right: No] Edema: [Left: N] [Right: o] Vascular Assessment Pulses: Dorsalis Pedis Palpable: [Right:Yes] Electronic Signature(s) Signed: 08/24/2019 11:17:39 AM By: [name] Entered By: [name] on 08/24/2019 10:15:05 [name], [name] -------------------------------------------------------------------------------- Multi Wound Chart Details Patient Name: [name] Date of Service: 08/24/2019 10:15 AM Medical Record Number: [id number] Patient Account Number: [id number] Date of Birth/Sex: 19-Dec-1930 (86 y.o. F) Treating RN: [name] Primary Care Ferrell Claiborne: [name] Other Clinician: Referring [name]: [name] Treating [name]/Extender: [name] in Treatment: 18 Vital Signs Height(in): 58 Pulse(bpm): 81 Weight(lbs): 195 Blood Pressure(mmHg): 145/55 Body Mass Index(BMI): 29 Temperature(F):  98.8 Respiratory Rate(breaths/min): 16 Photos: [N/A:N/A] Wound Location: Right, Dorsal Foot N/A N/A Wounding Event: Trauma N/A N/A Primary Etiology: Cellulitis N/A N/A Comorbid History: Cataracts, Arrhythmia, Congestive N/A N/A Heart Failure, Hypertension, Type II Diabetes, Rheumatoid Arthritis Date Acquired: 03/24/2019 N/A N/A Weeks of Treatment: 18 N/A N/A Wound Status: Open N/A N/A Measurements L x W x D (cm) 3.9x3.5x0.2 N/A N/A Area (cm) : 10.721 N/A N/A Volume (cm) : 2.144 N/A N/A % Reduction in Area: -62.50% N/A N/A % Reduction in Volume: -224.80% N/A N/A Classification: Full Thickness Without Exposed N/A N/A Support Structures Exudate Amount: Medium  N/A N/A Exudate Type: Serosanguineous N/A N/A Exudate Color: red, brown N/A N/A Wound Margin: Thickened N/A N/A Granulation Amount: Small (1-33%) N/A N/A Granulation Quality: Red N/A N/A Necrotic Amount: Large (67-100%) N/A N/A Exposed Structures: Fat Layer (Subcutaneous Tissue) N/A N/A Exposed: Yes Fascia: No Tendon: No Muscle: No Joint: No Bone: No Epithelialization: None N/A N/A Debridement: Debridement - Excisional N/A N/A Pre-procedure Verification/Time 10:23 N/A N/A Out Taken: Pain Control: Lidocaine N/A N/A Tissue Debrided: Subcutaneous, Slough N/A N/A Level: Skin/Subcutaneous Tissue N/A N/A Debridement Area (sq cm): 13.65 N/A N/A [name], [name] Instrument: Curette N/A N/A Bleeding: Minimum N/A N/A Hemostasis Achieved: Pressure N/A N/A Debridement Treatment Procedure was tolerated well N/A N/A Response: Post Debridement Measurements 3.9x3.5x0.3 N/A N/A L x W x D (cm) Post Debridement Volume: (cm) 3.216 N/A N/A Procedures Performed: Debridement N/A N/A Treatment Notes Wound #2 (Right, Dorsal Foot) Notes Iodofex, kerlix and coban toes to knee Electronic Signature(s) Signed: 08/24/2019 4:08:23 PM By: [name] Ham MD Entered By: [name] on 08/24/2019 11:03:20 [name], [name]  ([id number]) -------------------------------------------------------------------------------- Multi-Disciplinary Care Plan Details Patient Name: [name] Date of Service: 08/24/2019 10:15 AM Medical Record Number: [id number] Patient Account Number: [id number] Date of Birth/Sex: 07/17/1930 (86 y.o. F) Treating RN: [name] Primary Care Sharnise Blough: [name] Other Clinician: Referring Abigaelle Verley: [name] Treating Palmyra Rogacki/Extender: [name] in Treatment: 18 Active Inactive Necrotic Tissue Nursing Diagnoses: Impaired tissue integrity related to necrotic/devitalized tissue Knowledge deficit related to management of necrotic/devitalized tissue Goals: Necrotic/devitalized tissue will be minimized in the wound bed Date Initiated: 04/20/2019 Target Resolution Date: 05/04/2019 Goal Status: Active Interventions: Assess patient pain level pre-, during and post procedure and prior to discharge Provide education on necrotic tissue and debridement process Treatment Activities: Apply topical anesthetic as ordered : 04/20/2019 Notes: Venous Leg Ulcer Nursing Diagnoses: Actual venous Insuffiency (use after diagnosis is confirmed) Goals: Patient/caregiver will verbalize understanding of disease process and disease management Date Initiated: 04/20/2019 Target Resolution Date: 05/04/2019 Goal Status: Active Interventions: Assess peripheral edema status every visit. Notes: Wound/Skin Impairment Nursing Diagnoses: Impaired tissue integrity Goals: Patient/caregiver will verbalize understanding of skin care regimen Date Initiated: 04/20/2019 Target Resolution Date: 05/04/2019 Goal Status: Active Ulcer/skin breakdown will have a volume reduction of 30% by week 4 Date Initiated: 04/20/2019 Target Resolution Date: 05/18/2019 Goal Status: Active Interventions: Assess ulceration(s) every visit MARSHALL, Kaila ([id number]) Treatment Activities: Skin care regimen initiated :  04/20/2019 Topical wound management initiated : 04/20/2019 Notes: Electronic Signature(s) Signed: 08/24/2019 11:11:47 AM By: [name], BSN, RN, CWS, Kim RN, BSN Entered By: [name], BSN, RN, CWS, [name] on 08/24/2019 10:23:01 Tawni [name], Ha ([id number]) -------------------------------------------------------------------------------- Pain Assessment Details Patient Name: [name] Date of Service: 08/24/2019 10:15 AM Medical Record Number: [id number] Patient Account Number: [id number] Date of Birth/Sex: 1930/11/01 (86 y.o. F) Treating RN: [name] Primary Care Jacee Enerson: [name] Other Clinician: Referring Allahna Husband: [name] Treating Lanier Millon/Extender: [name] in Treatment: 18 Active Problems Location of Pain Severity and Description of Pain Patient Has Paino No Site Locations Pain Management and Medication Current Pain Management: Electronic Signature(s) Signed: 08/24/2019 11:11:47 AM By: [name], BSN, RN, CWS, Kim RN, BSN Signed: 08/24/2019 3:16:46 PM By: [name] RCP, RRT, CHT Entered By: [name] on 08/24/2019 10:09:15 Disano, Javier ([id number]) -------------------------------------------------------------------------------- Patient/Caregiver Education Details Patient Name: [name] Date of Service: 08/24/2019 10:15 AM Medical Record Number: [id number] Patient Account Number: [id number] Date of Birth/Gender: May 02, 1931 (86 y.o. F) Treating RN: [name] Primary Care Physician: [name] Other Clinician:  Referring Physician: Emily [name] Treating Physician/Extender: [name] in Treatment: 35 Education Assessment Education Provided To: Patient Education Topics Provided Wound Debridement: Handouts: Wound Debridement Methods: Demonstration Responses: State content correctly Electronic Signature(s) Signed: 08/24/2019 11:11:47 AM By: [name], BSN, RN, CWS, Kim RN, BSN Entered By: [name], BSN, RN, CWS, [name] on  08/24/2019 10:28:29 Tawni [name], Andre ([id number]) -------------------------------------------------------------------------------- Wound Assessment Details Patient Name: [name] Date of Service: 08/24/2019 10:15 AM Medical Record Number: [id number] Patient Account Number: [id number] Date of Birth/Sex: [birth year] (86 y.o. F) Treating RN: [name] Primary Care Brytani Voth: [name] Other Clinician: Referring Sarahy Creedon: [name] Treating Tian Davison/Extender: [name] in Treatment: 18 Wound Status Wound Number: 2 Primary Cellulitis Etiology: Wound Location: Right, Dorsal Foot Wound Open Wounding Event: Trauma Status: Date Acquired: 03/24/2019 Comorbid Cataracts, Arrhythmia, Congestive Heart Failure, Weeks Of Treatment: 18 History: Hypertension, Type II Diabetes, Rheumatoid Arthritis Clustered Wound: No Photos Wound Measurements Length: (cm) 3.9 Width: (cm) 3.5 Depth: (cm) 0.2 Area: (cm) 10.721 Volume: (cm) 2.144 % Reduction in Area: -62.5% % Reduction in Volume: -224.8% Epithelialization: None Tunneling: No Undermining: No Wound Description Classification: Full Thickness Without Exposed Support Structures Wound Margin: Thickened Exudate Amount: Medium Exudate Type: Serosanguineous Exudate Color: red, brown Foul Odor After Cleansing: No Slough/Fibrino Yes Wound Bed Granulation Amount: Small (1-33%) Exposed Structure Granulation Quality: Red Fascia Exposed: No Necrotic Amount: Large (67-100%) Fat Layer (Subcutaneous Tissue) Exposed: Yes Necrotic Quality: Adherent Slough Tendon Exposed: No Muscle Exposed: No Joint Exposed: No Bone Exposed: No Treatment Notes Wound #2 (Right, Dorsal Foot) Notes Iodofex, kerlix and coban toes to knee Electronic Signature(s) Signed: 08/24/2019 11:17:39 AM By: Collene Gobble, Coraline ([id number]) Entered By: [name] on 08/24/2019 10:14:53 Fedak, Shawnice  ([id number]) -------------------------------------------------------------------------------- Vitals Details Patient Name: [name] Date of Service: 08/24/2019 10:15 AM Medical Record Number: [id number] Patient Account Number: [id number] Date of Birth/Sex: 04-22-1931 (86 y.o. F) Treating RN: [name] Primary Care Freeman Borba: [name] Other Clinician: Referring Jazel Nimmons: [name] Treating Raphaella Larkin/Extender: [name] in Treatment: 18 Vital Signs Time Taken: 10:05 Temperature (F): 98.8 Height (in): 69 Pulse (bpm): 53 Weight (lbs): 195 Respiratory Rate (breaths/min): 16 Body Mass Index (BMI): 28.8 Blood Pressure (mmHg): 145/55 Reference Range: 80 - 120 mg / dl Electronic Signature(s) Signed: 08/24/2019 3:16:46 PM By: [name] RCP, RRT, CHT Entered By: Becky Sax, Amado Nash on 08/24/2019 10:11:08

## 2019-08-31 ENCOUNTER — Encounter: Payer: Medicare Other | Admitting: Internal Medicine

## 2019-08-31 ENCOUNTER — Other Ambulatory Visit: Payer: Self-pay

## 2019-08-31 DIAGNOSIS — E11621 Type 2 diabetes mellitus with foot ulcer: Secondary | ICD-10-CM | POA: Diagnosis not present

## 2019-09-01 ENCOUNTER — Ambulatory Visit: Payer: PRIVATE HEALTH INSURANCE | Admitting: Internal Medicine

## 2019-09-01 NOTE — Progress Notes (Signed)
[place], [place] ([id number]) Visit Report for 08/31/2019 Arrival Information Details Patient Name: [name], [name] [facility] Date of Service: 08/31/2019 10:30 AM Medical Record Number: [id number] Patient Account Number: [id number] Date of Birth/Sex: [birth year] (86 y.o. F) Treating RN: Montey Hora Primary Care Conner Neiss: Emily Filbert Other Clinician: Referring Zayla Agar: Emily Filbert Treating Martesha Niedermeier/Extender: Tito Dine in Treatment: 39 Visit Information History Since Last Visit Added or deleted any medications: No Patient Arrived: [place] Any new allergies or adverse reactions: No Arrival Time: 10:17 Had a fall or experienced change in No Accompanied By: self activities of daily living that may affect Transfer Assistance: None risk of falls: Patient Identification Verified: Yes Signs or symptoms of abuse/neglect since last visito No Secondary Verification Process Completed: Yes Hospitalized since last visit: No Patient Has Alerts: Yes Implantable device outside of the clinic excluding No Patient Alerts: Patient on Blood Thinner cellular tissue based products placed in the center ABI L 1.19 R 0.48 03/2019 since last visit: 2-325mg  Aspirin daily Has Dressing in Place as Prescribed: Yes TBI R .13 L .42 Has Compression in Place as Prescribed: Yes Pain Present Now: No Electronic Signature(s) Signed: 08/31/2019 4:33:58 PM By: Montey Hora Entered By: Montey Hora on 08/31/2019 10:19:45 [place], [place] ([id number]) -------------------------------------------------------------------------------- Encounter Discharge Information Details Patient Name: [name] Date of Service: 08/31/2019 10:30 AM Medical Record Number: [id number] Patient Account Number: [id number] Date of Birth/Sex: 1931/01/15 (86 y.o. F) Treating RN: Cornell Barman Primary Care Sudais Banghart: Emily Filbert Other Clinician: Referring Madylyn Insco: Emily Filbert Treating Quaneisha Hanisch/Extender: Tito Dine in Treatment:  45 Encounter Discharge Information Items Post Procedure Vitals Discharge Condition: Stable Temperature (F): 98.8 Ambulatory Status: Wheelchair Pulse (bpm): 53 Discharge Destination: Home Respiratory Rate (breaths/min): 16 Transportation: Private Auto Blood Pressure (mmHg): 171/65 Accompanied By: self Schedule Follow-up Appointment: Yes Clinical Summary of Care: Electronic Signature(s) Signed: 09/01/2019 5:10:21 PM By: Gretta Cool, BSN, RN, CWS, Kim RN, BSN Entered By: Gretta Cool, BSN, RN, CWS, Kim on 08/31/2019 10:54:28 [name], [name] ([id number]) -------------------------------------------------------------------------------- Lower Extremity Assessment Details Patient Name: [name] Date of Service: 08/31/2019 10:30 AM Medical Record Number: [id number] Patient Account Number: [id number] Date of Birth/Sex: January 12, 1931 (86 y.o. F) Treating RN: Montey Hora Primary Care Ayad Nieman: Emily Filbert Other Clinician: Referring Sharyn Brilliant: Emily Filbert Treating Emylie Amster/Extender: Tito Dine in Treatment: 19 Edema Assessment Assessed: [Left: No] [Right: No] Edema: [Left: Ye] [Right: s] Calf Left: Right: Point of Measurement: 32 cm From Medial Instep cm 34 cm Ankle Left: Right: Point of Measurement: 10 cm From Medial Instep cm 28.5 cm Vascular Assessment Pulses: Dorsalis Pedis Palpable: [Right:Yes] Electronic Signature(s) Signed: 08/31/2019 4:33:58 PM By: Montey Hora Entered By: Montey Hora on 08/31/2019 10:25:17 [place], [place] ([id number]) -------------------------------------------------------------------------------- Multi Wound Chart Details Patient Name: [name] Date of Service: 08/31/2019 10:30 AM Medical Record Number: [id number] Patient Account Number: [id number] Date of Birth/Sex: 1930/06/03 (86 y.o. F) Treating RN: Cornell Barman Primary Care Flannery Cavallero: Emily Filbert Other Clinician: Referring Euline Kimbler: Emily Filbert Treating Saud Bail/Extender: Tito Dine  in Treatment: 19 Vital Signs Height(in): 5 Pulse(bpm): 27 Weight(lbs): 195 Blood Pressure(mmHg): 171/65 Body Mass Index(BMI): 29 Temperature(F): 98.8 Respiratory Rate(breaths/min): 18 Photos: [N/A:N/A] Wound Location: Right, Dorsal Foot N/A N/A Wounding Event: Trauma N/A N/A Primary Etiology: Cellulitis N/A N/A Comorbid History: Cataracts, Arrhythmia, Congestive N/A N/A Heart Failure, Hypertension, Type II Diabetes, Rheumatoid Arthritis Date Acquired: 03/24/2019 N/A N/A Weeks of Treatment: 19 N/A N/A Wound Status: Open N/A N/A Measurements L x W x D (cm) 4.1x3.8x0.2 N/A N/A Area (cm) : 12.237 N/A N/A Volume (cm) :  2.447 N/A N/A % Reduction in Area: -85.50% N/A N/A % Reduction in Volume: -270.80% N/A N/A Classification: Full Thickness Without Exposed N/A N/A Support Structures Exudate Amount: Medium N/A N/A Exudate Type: Serosanguineous N/A N/A Exudate Color: red, brown N/A N/A Wound Margin: Thickened N/A N/A Granulation Amount: Small (1-33%) N/A N/A Granulation Quality: Red N/A N/A Necrotic Amount: Large (67-100%) N/A N/A Exposed Structures: Fat Layer (Subcutaneous Tissue) N/A N/A Exposed: Yes Fascia: No Tendon: No Muscle: No Joint: No Bone: No Epithelialization: None N/A N/A Debridement: Debridement - Excisional N/A N/A Pre-procedure Verification/Time 10:50 N/A N/A Out Taken: Pain Control: Lidocaine N/A N/A Tissue Debrided: Subcutaneous, Slough N/A N/A Level: Skin/Subcutaneous Tissue N/A N/A Debridement Area (sq cm): 15.58 N/A N/A [name], [name] ([id number]) Instrument: Curette N/A N/A Bleeding: Moderate N/A N/A Hemostasis Achieved: Pressure N/A N/A Debridement Treatment Procedure was tolerated well N/A N/A Response: Post Debridement Measurements 4.1x3.8x0.3 N/A N/A L x W x D (cm) Post Debridement Volume: (cm) 3.671 N/A N/A Procedures Performed: Debridement N/A N/A Treatment Notes Wound #2 (Right, Dorsal Foot) Notes Iodofex, kerlix and coban toes to  knee Electronic Signature(s) Signed: 09/01/2019 7:42:01 AM By: Linton Ham MD Entered By: Linton Ham on 08/31/2019 12:06:11 [name], [name] ([id number]) -------------------------------------------------------------------------------- Multi-Disciplinary Care Plan Details Patient Name: [name] Date of Service: 08/31/2019 10:30 AM Medical Record Number: [id number] Patient Account Number: [id number] Date of Birth/Sex: [birth year] (86 y.o. F) Treating RN: Cornell Barman Primary Care Gannon Heinzman: Emily Filbert Other Clinician: Referring Boysie Bonebrake: Emily Filbert Treating Michelena Culmer/Extender: Tito Dine in Treatment: 19 Active Inactive Necrotic Tissue Nursing Diagnoses: Impaired tissue integrity related to necrotic/devitalized tissue Knowledge deficit related to management of necrotic/devitalized tissue Goals: Necrotic/devitalized tissue will be minimized in the wound bed Date Initiated: 04/20/2019 Target Resolution Date: 05/04/2019 Goal Status: Active Interventions: Assess patient pain level pre-, during and post procedure and prior to discharge Provide education on necrotic tissue and debridement process Treatment Activities: Apply topical anesthetic as ordered : 04/20/2019 Notes: Venous Leg Ulcer Nursing Diagnoses: Actual venous Insuffiency (use after diagnosis is confirmed) Goals: Patient/caregiver will verbalize understanding of disease process and disease management Date Initiated: 04/20/2019 Target Resolution Date: 05/04/2019 Goal Status: Active Interventions: Assess peripheral edema status every visit. Notes: Wound/Skin Impairment Nursing Diagnoses: Impaired tissue integrity Goals: Patient/caregiver will verbalize understanding of skin care regimen Date Initiated: 04/20/2019 Target Resolution Date: 05/04/2019 Goal Status: Active Ulcer/skin breakdown will have a volume reduction of 30% by week 4 Date Initiated: 04/20/2019 Target Resolution Date: 05/18/2019 Goal  Status: Active Interventions: Assess ulceration(s) every visit [name], [name] ([id number]) Treatment Activities: Skin care regimen initiated : 04/20/2019 Topical wound management initiated : 04/20/2019 Notes: Electronic Signature(s) Signed: 09/01/2019 5:10:21 PM By: Gretta Cool, BSN, RN, CWS, Kim RN, BSN Entered By: Gretta Cool, BSN, RN, CWS, Kim on 08/31/2019 10:48:45 [name], [name] ([id number]) -------------------------------------------------------------------------------- Pain Assessment Details Patient Name: [name] Date of Service: 08/31/2019 10:30 AM Medical Record Number: [id number] Patient Account Number: [id number] Date of Birth/Sex: [birth year] (86 y.o. F) Treating RN: Montey Hora Primary Care Crescentia Boutwell: Emily Filbert Other Clinician: Referring Endy Easterly: Emily Filbert Treating Omaria Plunk/Extender: Tito Dine in Treatment: 32 Active Problems Location of Pain Severity and Description of Pain Patient Has Paino No Site Locations Pain Management and Medication Current Pain Management: Electronic Signature(s) Signed: 08/31/2019 4:33:58 PM By: Montey Hora Entered By: Montey Hora on 08/31/2019 10:23:54 [name], [name] ([id number]) -------------------------------------------------------------------------------- Patient/Caregiver Education Details Patient Name: [name] Date of Service: 08/31/2019 10:30 AM Medical Record Number: [id number] Patient Account Number: [id number] Date of Birth/Gender: 18-Oct-1930 (86 y.o.  F) Treating RN: Cornell Barman Primary Care Physician: Emily Filbert Other Clinician: Referring Physician: Emily Filbert Treating Physician/Extender: Tito Dine in Treatment: 9 Education Assessment Education Provided To: Patient Education Topics Provided Wound/Skin Impairment: Handouts: Caring for Your Ulcer, Other: continue wound care as prescribed Methods: Demonstration, Explain/Verbal Responses: State content correctly Electronic  Signature(s) Signed: 09/01/2019 5:10:21 PM By: Gretta Cool, BSN, RN, CWS, Kim RN, BSN Entered By: Gretta Cool, BSN, RN, CWS, Kim on 08/31/2019 10:53:44 [name], [name] ([id number]) -------------------------------------------------------------------------------- Wound Assessment Details Patient Name: [name] Date of Service: 08/31/2019 10:30 AM Medical Record Number: [id number] Patient Account Number: [id number] Date of Birth/Sex: 29-Aug-1930 (86 y.o. F) Treating RN: Montey Hora Primary Care Delyle Weider: Emily Filbert Other Clinician: Referring Jamera Vanloan: Emily Filbert Treating Imberly Troxler/Extender: Tito Dine in Treatment: 19 Wound Status Wound Number: 2 Primary Cellulitis Etiology: Wound Location: Right, Dorsal Foot Wound Open Wounding Event: Trauma Status: Date Acquired: 03/24/2019 Comorbid Cataracts, Arrhythmia, Congestive Heart Failure, Weeks Of Treatment: 19 History: Hypertension, Type II Diabetes, Rheumatoid Arthritis Clustered Wound: No Photos Wound Measurements Length: (cm) 4.1 Width: (cm) 3.8 Depth: (cm) 0.2 Area: (cm) 12.237 Volume: (cm) 2.447 % Reduction in Area: -85.5% % Reduction in Volume: -270.8% Epithelialization: None Tunneling: No Undermining: No Wound Description Classification: Full Thickness Without Exposed Support Structures Wound Margin: Thickened Exudate Amount: Medium Exudate Type: Serosanguineous Exudate Color: red, brown Foul Odor After Cleansing: No Slough/Fibrino Yes Wound Bed Granulation Amount: Small (1-33%) Exposed Structure Granulation Quality: Red Fascia Exposed: No Necrotic Amount: Large (67-100%) Fat Layer (Subcutaneous Tissue) Exposed: Yes Necrotic Quality: Adherent Slough Tendon Exposed: No Muscle Exposed: No Joint Exposed: No Bone Exposed: No Treatment Notes Wound #2 (Right, Dorsal Foot) Notes Iodofex, kerlix and coban toes to knee Electronic Signature(s) Signed: 08/31/2019 4:33:58 PM By: Teola Bradley, [name]  ([id number]) Entered By: Montey Hora on 08/31/2019 10:28:13 [name], [name] ([id number]) -------------------------------------------------------------------------------- Vitals Details Patient Name: [name] Date of Service: 08/31/2019 10:30 AM Medical Record Number: [id number] Patient Account Number: [id number] Date of Birth/Sex: [birth year] (86 y.o. F) Treating RN: Montey Hora Primary Care Ardith Test: Emily Filbert Other Clinician: Referring Armand Preast: Emily Filbert Treating [name]/Extender: Tito Dine in Treatment: 19 Vital Signs Time Taken: 10:19 Temperature (F): 98.8 Height (in): 69 Pulse (bpm): 53 Weight (lbs): 195 Respiratory Rate (breaths/min): 18 Body Mass Index (BMI): 28.8 Blood Pressure (mmHg): 171/65 Reference Range: 80 - 120 mg / dl Electronic Signature(s) Signed: 08/31/2019 4:33:58 PM By: Montey Hora Entered By: Montey Hora on 08/31/2019 10:23:47

## 2019-09-01 NOTE — Progress Notes (Signed)
[place], [place] ([id number]) Visit Report for 08/31/2019 Debridement Details Patient Name: [name], [name] [facility] Date of Service: 08/31/2019 10:30 AM Medical Record Number: [id number] Patient Account Number: [id number] Date of Birth/Sex: 31-Oct-1930 (86 y.o. F) Treating RN: Cornell Barman Primary Care Provider: Emily Filbert Other Clinician: Referring Provider: Emily Filbert Treating Provider/Extender: Tito Dine in Treatment: 19 Debridement Performed for Wound #2 Right,Dorsal Foot Assessment: Performed By: Physician Ricard Dillon, MD Debridement Type: Debridement Level of Consciousness (Pre- Awake and Alert procedure): Pre-procedure Verification/Time Out Yes - 10:50 Taken: Start Time: 10:50 Pain Control: Lidocaine Total Area Debrided (L x W): 4.1 (cm) x 3.8 (cm) = 15.58 (cm) Tissue and other material debrided: Viable, Non-Viable, Slough, Subcutaneous, Biofilm, Slough Level: Skin/Subcutaneous Tissue Debridement Description: Excisional Instrument: Curette Bleeding: Moderate Hemostasis Achieved: Pressure End Time: 10:52 Response to Treatment: Procedure was tolerated well Level of Consciousness (Post- Awake and Alert procedure): Post Debridement Measurements of Total Wound Length: (cm) 4.1 Width: (cm) 3.8 Depth: (cm) 0.3 Volume: (cm) 3.671 Character of Wound/Ulcer Post Debridement: Stable Post Procedure Diagnosis Same as Pre-procedure Electronic Signature(s) Signed: 09/01/2019 7:42:01 AM By: Linton Ham MD Signed: 09/01/2019 5:10:21 PM By: Gretta Cool, BSN, RN, CWS, Kim RN, BSN Entered By: Linton Ham on 08/31/2019 12:08:58 [place], Danajah ([id number]) -------------------------------------------------------------------------------- HPI Details Patient Name: [name] Date of Service: 08/31/2019 10:30 AM Medical Record Number: [id number] Patient Account Number: [id number] Date of Birth/Sex: 02-18-1931 (86 y.o. F) Treating RN: Cornell Barman Primary Care Provider: Emily Filbert  Other Clinician: Referring Provider: Emily Filbert Treating Provider/Extender: Tito Dine in Treatment: 82 History of Present Illness HPI Description: ADMISSION 04/20/2019 This is an 86 year old woman who lives in the independent part of [place]. She is a type II diabetic on oral agents and insulin with a recent hemoglobin A1c of 6.3 on 10/6. She was initially seen by primary care on 11/12 after dropping a heavy walker on her bilateral dorsal feet. She had blisters on the right and left lateral foot. She was given doxycycline. She was seen the next day in [place] [facility] on 11/13 continued on the.C. Bilateral foot x-rays were negative. She was admitted to hospital from 11/17 through 11/27 with acute kidney injury I did not research this although the patient tells me that at discharge her kidney function had returned to normal. During the course of this hospitalization it was noted she had wounds on her bilateral feet and her peripheral pulses were nonpalpable. She was evaluated by Dr. Delana Meyer. It was recommended that she consider angiography on the right leg and it was unlikely that she would heal the right leg as her ABI here was 0.48 with monophasic waveforms on the left her ABI was 1.19 with biphasic waveforms. One would wonder if the ABI was artificially elevated on the left. In any case Dr. Cydney Ok note accurately describes the patient's current state of mind. She does not want to rush into an intervention and she is here for our review of her wounds. Noteworthy that when she arrived I think her renal insufficiency was felt to be prerenal. Her GFR on arrival was 22 now 36. Past medical history includes type 2 diabetes with peripheral neuropathy, atrial fibrillation, renal insufficiency We did not calculate ABIs in our clinic this was 0.48 on the right and 1.19 on the left as noted above. Monophasic waveforms on the right biphasic on the left 12/16; patient admitted to the  clinic last week with trauma critical limb ischemia on the right. Today her wound on the left  dorsal foot is healed. She still has the black eschar on the dorsal right foot that we have been using Santyl on. There is apparently some drainage. She arrives in clinic today with more swelling on the right great and the left dorsal surrounding her foot wounds. Some erythema especially on the right. There is some tenderness on the right but not as much as I might expect if this was all infection nevertheless he does have some neuropathy which might be blunting discomfort. She reports no additional trauma. She is at the rehab section of [facility], she lives in the independent part of [place]. She tells me she has some pain in the right leg at night which wakes her up from sleeping although she manages to get back to sleep. She has about 150 foot exercise tolerance and then gets fatigue in her legs. Again I discussed the issue of angiogram with the patient especially on the right. She just will not agree to this. She states she wants the rehab and then go to [facility] where a list of her specialists. She was on Levaquin last week we did not prescribe this but she is finished. She describes some nausea and loose stools but does not sound dramatically unwell with this Left dorsal foot had some very superficial areas today I think related to swelling. The big problem is on the dorsal right foot black surface eschar with increased swelling. She does not have any pain or tenderness although she has neuropathy. We have been using Santyl. She has PAD and again I think needs an angiogram. 12/30; 2-week follow-up. The areas on the right dorsal foot. Everything is healed on the left. She is still in the rehab part of [place]. She went to see Dr. Lucky Cowboy on 12/29. He reiterated to her he felt that she needed angiography on the right leg for critical limb ischemia. She seemed to have 2 major concerns that is wanting to see  a nephrologist prior to procedure in consultation. She does not currently have a nephrologist. She also had no confidence in the original arterial studies that were done in the hospital and wanted them redone. I do not think Dr. Lucky Cowboy felt that they needed to be redone and wanted to proceed with angiography. At the end of it he simply stated that he would be glad to do the angiogram at any time the patient agreed to proceed I looked briefly at her kidney function which had normalized by the time she left the hospital in late November. I do not know that the patient has any additional concern at this point other than the reduction in GFR associated with her diabetes and normal aging. 1/27; I have not seen this patient in almost 4 weeks. She was apparently isolated in the [facility] facility because of COVID-19. She tells me she was mildly symptomatic but feels better now although she still feels like she is in a "fog" as far as her mental abilities. She states that she is up and walking for short distances with a walker. She would like to go back to her independent apartment. Still using Santyl to the wound The patient is not describing a lot of pain. She does not describe claudication but her activity sounds very limited especially when she was under isolation. She is still reluctant to go forward with the angiogram that was suggested. Currently she wants to recover fully from Covid infection 2/10; 2-week follow-up. Patient arrives with 100% nonviable surface over  the wound on her right dorsal foot. I changed her to [facility] from [place] last week in terms of surface the wound is deteriorated although I think the surface area is about the same. She is not describing claudication walking about 50 feet The patient tells me she is trying to get in with her primary doctor however there is restrictions with regards to the pandemic. She wants to be referred to nephrology and then to vascular surgery at  [facility] I cannot help her with this. I did tell her that her creatinine had normalized via the last lab work I could see done at [facility] in November and her estimated GFR was 53 at that time. By my estimation careful application of contrast dye and Porzio, [name] ([id number]) preangiogram hydration should be able to get her through the necessary angiography with no complication however she simply does not want to hear this and she is going to put this off until she can get through the [place] system 2/24; 2-week follow-up wound surface area is about the same certainly not worse. She has 100% slough cover we have been using Santyl 3/3; 2-week follow-up. The patient's wound is essentially unchanged. The surface has slough on it no debridement it certainly does not look perfused. She is worried about the erythema she is having in her foot. She had a cramp in her leg last night but is not complaining of any other type of pain. Is also is concerned about some edema that is not well corrected by the diuretic she is on 3/16; 2-week follow-up. The wound is unchanged in appearance and in size. The patient has 100% nonviable surface. She saw Dr. Bonnee Quin on 07/21/2019 vascular at [facility]. His statement was "chronic limb threatening ischemia". She was actually planned for a right leg arteriogram and intervention today however she states this was postponed and they are supposed to call her this afternoon to reschedule. Her dressing is being changed at [facility] 08/04/2019 upon evaluation today patient has had her evaluation with vascular at [facility]. Subsequently she unfortunately does not have good arterial flow into her bilateral lower extremities. She actually is doing worse on the left as compared to the right but neither is effectively receiving good blood flow from the heart down into the lower extremity. She tells me that she actually has her dressing changed on a regular basis at [facility]. She is scheduled  apparently to have intervention tomorrow in regard to her right lower extremity and then they will proceed from there in regard to the left depend on how things go. 4/14; the patient was admitted to [facility] from 3/26 through 08/07/2019. She underwent an aortogram ultimately underwent a right popliteal orbital atherectomy and angioplasty, a right anterior tibial artery orbital atherectomy and angioplasty and a right dorsalis pedis artery angioplasty. She was kept in hospital for hydration secondary to a mild bump in her creatinine. I see she was back in the vascular clinic complaining of fatigue but this was not felt to be secondary to procedure which was not done under general anesthesia. She was apparently using [employer], the nurse at [facility] is changing the dressing 4/21; the patient has been back to see vascular surgery at [facility] yesterday. She had an improvement in her right arterial studies which were initially done on 3/11. Although her ABI initially was 0.83 on 3/11 yesterday at 0.74 there was a fairly marked improvement in her TBI from 0.13-0.52. The patient is not complaining of any pain.  We have been placing Iodoflex on the wound under kerlix Coban Electronic Signature(s) Signed: 09/01/2019 7:42:01 AM By: Linton Ham MD Entered By: Linton Ham on 08/31/2019 12:13:22 [name], [name] ([id number]) -------------------------------------------------------------------------------- Physical Exam Details Patient Name: [name] Date of Service: 08/31/2019 10:30 AM Medical Record Number: [id number] Patient Account Number: [id number] Date of Birth/Sex: [birth year] (86 y.o. F) Treating RN: Cornell Barman Primary Care Provider: Emily Filbert Other Clinician: Referring Provider: Emily Filbert Treating Provider/Extender: Tito Dine in Treatment: 19 Constitutional Sitting or standing Blood Pressure is within target range for patient.. . Respirations regular, non-labored and within target  range.. Temperature is normal and within the target range for the patient.Marland Kitchen appears in no distress. Notes Wound exam; covered in an adherent necrotic debris. 100%. Once again I remove this with a #5 curette hemostasis with direct pressure. I am able to get to a much better looking surface but not completely as much debridement as [name] is going to be required. I should note that her pedal pulse on the right was palpable even there is some degree of edema Electronic Signature(s) Signed: 09/01/2019 7:42:01 AM By: Linton Ham MD Entered By: Linton Ham on 08/31/2019 12:14:39 [name], [name] ([id number]) -------------------------------------------------------------------------------- Physician Orders Details Patient Name: [name] Date of Service: 08/31/2019 10:30 AM Medical Record Number: [id number] Patient Account Number: [id number] Date of Birth/Sex: 1931/01/09 (86 y.o. F) Treating RN: Cornell Barman Primary Care Provider: Emily Filbert Other Clinician: Referring Provider: Emily Filbert Treating Provider/Extender: Tito Dine in Treatment: 74 Verbal / Phone Orders: No Diagnosis Coding Wound Cleansing Wound #2 Right,Dorsal Foot o Clean wound with Normal Saline. Anesthetic (add to Medication List) Wound #2 Right,Dorsal Foot o Topical Lidocaine 4% cream applied to wound bed prior to debridement (In Clinic Only). Skin Barriers/Peri-Wound Care Wound #2 Right,Dorsal Foot o Moisturizing lotion Primary Wound Dressing Wound #2 Right,Dorsal Foot o Iodoflex Secondary Dressing Wound #2 Right,Dorsal Foot o ABD pad Dressing Change Frequency Wound #2 Right,Dorsal Foot o Change Dressing Monday, Wednesday, Friday Follow-up Appointments Wound #2 Right,Dorsal Foot o Return Appointment in 1 week. Edema Control Wound #2 Right,Dorsal Foot o Kerlix and Coban - Right Lower Extremity o Elevate legs to the level of the heart and pump ankles as often as  possible Electronic Signature(s) Signed: 09/01/2019 7:42:01 AM By: Linton Ham MD Signed: 09/01/2019 5:10:21 PM By: Gretta Cool, BSN, RN, CWS, Kim RN, BSN Entered By: Gretta Cool, BSN, RN, CWS, Kim on 08/31/2019 10:53:06 [name], [place] ([id number]) -------------------------------------------------------------------------------- Problem List Details Patient Name: [name] Date of Service: 08/31/2019 10:30 AM Medical Record Number: [id number] Patient Account Number: [id number] Date of Birth/Sex: 11/19/1930 (86 y.o. F) Treating RN: Cornell Barman Primary Care Provider: Emily Filbert Other Clinician: Referring Provider: Emily Filbert Treating Provider/Extender: Tito Dine in Treatment: 57 Active Problems ICD-10 Evaluated Encounter Code Description Active Date Today Diagnosis E11.52 Type 2 diabetes mellitus with diabetic peripheral angiopathy with gangrene 04/20/2019 No Yes E11.42 Type 2 diabetes mellitus with diabetic polyneuropathy 04/20/2019 No Yes L97.528 Non-pressure chronic ulcer of other part of left foot with other specified 04/20/2019 No Yes severity E11.621 Type 2 diabetes mellitus with foot ulcer 04/20/2019 No Yes L97.518 Non-pressure chronic ulcer of other part of right foot with other specified 04/20/2019 No Yes severity Inactive Problems Resolved Problems Electronic Signature(s) Signed: 09/01/2019 7:42:01 AM By: Linton Ham MD Entered By: Linton Ham on 08/31/2019 12:05:53 [place], [place] ([id number]) -------------------------------------------------------------------------------- Progress Note Details Patient Name: [name] Date of Service: 08/31/2019 10:30 AM Medical Record Number: [id number] Patient Account Number:  [id number] Date of Birth/Sex: 30-Oct-1930 (86 y.o. F) Treating RN: Cornell Barman Primary Care Provider: Emily Filbert Other Clinician: Referring Provider: Emily Filbert Treating Provider/Extender: Tito Dine in Treatment: 19 Subjective History of  Present Illness (HPI) ADMISSION 04/20/2019 This is an 86 year old woman who lives in the independent part of [place]. She is a type II diabetic on oral agents and insulin with a recent hemoglobin A1c of 6.3 on 10/6. She was initially seen by primary care on 11/12 after dropping a heavy walker on her bilateral dorsal feet. She had blisters on the right and left lateral foot. She was given doxycycline. She was seen the next day in [place] clinic on 11/13 continued on the.C. Bilateral foot x-rays were negative. She was admitted to hospital from 11/17 through 11/27 with acute kidney injury I did not research this although the patient tells me that at discharge her kidney function had returned to normal. During the course of this hospitalization it was noted she had wounds on her bilateral feet and her peripheral pulses were nonpalpable. She was evaluated by Dr. Delana Meyer. It was recommended that she consider angiography on the right leg and it was unlikely that she would heal the right leg as her ABI here was 0.48 with monophasic waveforms on the left her ABI was 1.19 with biphasic waveforms. One would wonder if the ABI was artificially elevated on the left. In any case Dr. Cydney Ok note accurately describes the patient's current state of mind. She does not want to rush into an intervention and she is here for our review of her wounds. Noteworthy that when she arrived I think her renal insufficiency was felt to be prerenal. Her GFR on arrival was 22 now 36. Past medical history includes type 2 diabetes with peripheral neuropathy, atrial fibrillation, renal insufficiency We did not calculate ABIs in our clinic this was 0.48 on the right and 1.19 on the left as noted above. Monophasic waveforms on the right biphasic on the left 12/16; patient admitted to the clinic last week with trauma critical limb ischemia on the right. Today her wound on the left dorsal foot is healed. She still has the black eschar  on the dorsal right foot that we have been using Santyl on. There is apparently some drainage. She arrives in clinic today with more swelling on the right great and the left dorsal surrounding her foot wounds. Some erythema especially on the right. There is some tenderness on the right but not as much as I might expect if this was all infection nevertheless he does have some neuropathy which might be blunting discomfort. She reports no additional trauma. She is at the rehab section of [facility], she lives in the independent part of [place]. She tells me she has some pain in the right leg at night which wakes her up from sleeping although she manages to get back to sleep. She has about 150 foot exercise tolerance and then gets fatigue in her legs. Again I discussed the issue of angiogram with the patient especially on the right. She just will not agree to this. She states she wants the rehab and then go to [facility] where a list of her specialists. She was on Levaquin last week we did not prescribe this but she is finished. She describes some nausea and loose stools but does not sound dramatically unwell with this Left dorsal foot had some very superficial areas today I think related to swelling. The big problem is on  the dorsal right foot black surface eschar with increased swelling. She does not have any pain or tenderness although she has neuropathy. We have been using Santyl. She has PAD and again I think needs an angiogram. 12/30; 2-week follow-up. The areas on the right dorsal foot. Everything is healed on the left. She is still in the rehab part of [place]. She went to see Dr. Lucky Cowboy on 12/29. He reiterated to her he felt that she needed angiography on the right leg for critical limb ischemia. She seemed to have 2 major concerns that is wanting to see a nephrologist prior to procedure in consultation. She does not currently have a nephrologist. She also had no confidence in the original arterial  studies that were done in the hospital and wanted them redone. I do not think Dr. Lucky Cowboy felt that they needed to be redone and wanted to proceed with angiography. At the end of it he simply stated that he would be glad to do the angiogram at any time the patient agreed to proceed I looked briefly at her kidney function which had normalized by the time she left the hospital in late November. I do not know that the patient has any additional concern at this point other than the reduction in GFR associated with her diabetes and normal aging. 1/27; I have not seen this patient in almost 4 weeks. She was apparently isolated in the [facility] facility because of COVID-19. She tells me she was mildly symptomatic but feels better now although she still feels like she is in a "fog" as far as her mental abilities. She states that she is up and walking for short distances with a walker. She would like to go back to her independent apartment. Still using Santyl to the wound The patient is not describing a lot of pain. She does not describe claudication but her activity sounds very limited especially when she was under isolation. She is still reluctant to go forward with the angiogram that was suggested. Currently she wants to recover fully from Covid infection 2/10; 2-week follow-up. Patient arrives with 100% nonviable surface over the wound on her right dorsal foot. I changed her to [facility] from [place] last week in terms of surface the wound is deteriorated although I think the surface area is about the same. She is not describing claudication walking about 50 feet The patient tells me she is trying to get in with her primary doctor however there is restrictions with regards to the pandemic. She wants to be referred to nephrology and then to vascular surgery at [facility] I cannot help her with this. I did tell her that her creatinine had normalized via the last lab work I could see done at [facility] in November and  her estimated GFR was 53 at that time. By my estimation careful application of contrast dye and Mckenzie, Amara ([id number]) preangiogram hydration should be able to get her through the necessary angiography with no complication however she simply does not want to hear this and she is going to put this off until she can get through the [place] system 2/24; 2-week follow-up wound surface area is about the same certainly not worse. She has 100% slough cover we have been using Santyl 3/3; 2-week follow-up. The patient's wound is essentially unchanged. The surface has slough on it no debridement it certainly does not look perfused. She is worried about the erythema she is having in her foot. She had a cramp in her  leg last night but is not complaining of any other type of pain. Is also is concerned about some edema that is not well corrected by the diuretic she is on 3/16; 2-week follow-up. The wound is unchanged in appearance and in size. The patient has 100% nonviable surface. She saw Dr. Bonnee Quin on 07/21/2019 vascular at [facility]. His statement was "chronic limb threatening ischemia". She was actually planned for a right leg arteriogram and intervention today however she states this was postponed and they are supposed to call her this afternoon to reschedule. Her dressing is being changed at [facility] 08/04/2019 upon evaluation today patient has had her evaluation with vascular at [facility]. Subsequently she unfortunately does not have good arterial flow into her bilateral lower extremities. She actually is doing worse on the left as compared to the right but neither is effectively receiving good blood flow from the heart down into the lower extremity. She tells me that she actually has her dressing changed on a regular basis at [facility]. She is scheduled apparently to have intervention tomorrow in regard to her right lower extremity and then they will proceed from there in regard to the left depend on how  things go. 4/14; the patient was admitted to [facility] from 3/26 through 08/07/2019. She underwent an aortogram ultimately underwent a right popliteal orbital atherectomy and angioplasty, a right anterior tibial artery orbital atherectomy and angioplasty and a right dorsalis pedis artery angioplasty. She was kept in hospital for hydration secondary to a mild bump in her creatinine. I see she was back in the vascular clinic complaining of fatigue but this was not felt to be secondary to procedure which was not done under general anesthesia. She was apparently using Medih[employer], the nurse at [facility] is changing the dressing 4/21; the patient has been back to see vascular surgery at [facility] yesterday. She had an improvement in her right arterial studies which were initially done on 3/11. Although her ABI initially was 0.83 on 3/11 yesterday at 0.74 there was a fairly marked improvement in her TBI from 0.13-0.52. The patient is not complaining of any pain. We have been placing Iodoflex on the wound under kerlix Coban Objective Constitutional Sitting or standing Blood Pressure is within target range for patient.Marland Kitchen Respirations regular, non-labored and within target range.. Temperature is normal and within the target range for the patient.Marland Kitchen appears in no distress. Vitals Time Taken: 10:19 AM, Height: 69 in, Weight: 195 lbs, BMI: 28.8, Temperature: 98.8 F, Pulse: 53 bpm, Respiratory Rate: 18 breaths/min, Blood Pressure: 171/65 mmHg. General Notes: Wound exam; covered in an adherent necrotic debris. 100%. Once again I remove this with a #5 curette hemostasis with direct pressure. I am able to get to a much better looking surface but not completely as much debridement as [name] is going to be required. I should note that her pedal pulse on the right was palpable even there is some degree of edema Integumentary (Hair, Skin) Wound #2 status is Open. Original cause of wound was Trauma. The wound is located on the  Right,Dorsal Foot. The wound measures 4.1cm length x 3.8cm width x 0.2cm depth; 12.237cm^2 area and 2.447cm^3 volume. There is Fat Layer (Subcutaneous Tissue) Exposed exposed. There is no tunneling or undermining noted. There is a medium amount of serosanguineous drainage noted. The wound margin is thickened. There is small (1-33%) red granulation within the wound bed. There is a large (67-100%) amount of necrotic tissue within the wound bed including Adherent Slough. 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 There was a Excisional Skin/Subcutaneous Tissue Debridement with a total area of 15.58 sq cm performed by Ricard Dillon, MD. With the following instrument(s): Curette to remove Viable and Non-Viable tissue/material. Material removed includes Subcutaneous Tissue, Slough, and Biofilm after achieving pain control using Lidocaine. No specimens were taken. A time out was conducted at 10:50, prior to the start of the procedure. A Moderate amount of bleeding was controlled with Pressure. The procedure was tolerated well. Post Debridement Measurements: 4.1cm length x 3.8cm width x 0.3cm depth; 3.671cm^3 volume. Character of Wound/Ulcer Post Debridement is stable. Post procedure Diagnosis Wound #2: Same as Pre-Procedure Plan Wound Cleansing: Wound #2 Right,Dorsal Foot: Clean wound with Normal Saline. Anesthetic (add to Medication List): Wound #2 Right,Dorsal Foot: Topical Lidocaine 4% cream applied to wound bed prior to debridement  (In Clinic Only). Skin Barriers/Peri-Wound Care: Wound #2 Right,Dorsal Foot: Moisturizing lotion Primary Wound Dressing: Wound #2 Right,Dorsal Foot: Iodoflex Secondary Dressing: Wound #2 Right,Dorsal Foot: ABD pad Dressing Change Frequency: Wound #2 Right,Dorsal Foot: Change Dressing Monday, Wednesday, Friday Follow-up Appointments: Wound #2 Right,Dorsal Foot: Return Appointment in 1 week. Edema Control: Wound #2 Right,Dorsal Foot: Kerlix and Coban - Right Lower Extremity Elevate legs to the level of the heart and pump ankles as often as possible 1. I am going to continue the Iodoflex change 3 times a week 2. Continue kerlix Coban for now however she may require a 3 layer compression she still has quite a bit of edema although it is improved. 3. Her follow-up TBI yesterday was better on the right. Hopefully this means more blood flow getting down to the foot Electronic Signature(s) Signed: 09/01/2019 7:42:01 AM By: Linton Ham MD Entered By: Linton Ham on 08/31/2019 12:16:10 [name], [name] ([id number]) -------------------------------------------------------------------------------- SuperBill Details Patient Name: [name] Date of Service: 08/31/2019 Medical Record Number: [id number] Patient Account Number: [id number] Date of Birth/Sex: 1931-01-31 (86 y.o. F) Treating RN: Cornell Barman Primary Care Provider: Emily Filbert Other Clinician: Referring Provider: Emily Filbert Treating Provider/Extender: Tito Dine in Treatment: 19 Diagnosis Coding ICD-10 Codes Code Description E11.52 Type 2 diabetes mellitus with diabetic peripheral angiopathy with gangrene E11.42 Type 2 diabetes mellitus with diabetic polyneuropathy L97.528 Non-pressure chronic ulcer of other part of left foot with other specified severity E11.621 Type 2 diabetes mellitus with foot ulcer L97.518 Non-pressure chronic ulcer of other part of right foot with other specified severity Facility  Procedures CPT4 Code: [id number] Description: [id number] - DEB SUBQ TISSUE 20 SQ CM/< Modifier: Quantity: 1 CPT4 Code: Description: ICD-10 Diagnosis Description L97.518 Non-pressure chronic ulcer of other part of right foot with other specified s Modifier: everity Quantity: Physician Procedures CPT4 Code: [id number] Description: 11042 - WC PHYS SUBQ TISS 20 SQ CM Modifier: Quantity: 1 CPT4 Code: Description: ICD-10 Diagnosis Description L97.518 Non-pressure chronic ulcer of other part of right foot with other specified s Modifier: everity Quantity: Electronic Signature(s) Signed: 09/01/2019 7:42:01 AM By: Linton Ham MD Entered By: Linton Ham on 08/31/2019 12:16:31

## 2019-09-07 ENCOUNTER — Other Ambulatory Visit: Payer: Self-pay

## 2019-09-07 ENCOUNTER — Encounter: Payer: Medicare Other | Admitting: Internal Medicine

## 2019-09-07 DIAGNOSIS — E11621 Type 2 diabetes mellitus with foot ulcer: Secondary | ICD-10-CM | POA: Diagnosis not present

## 2019-09-08 NOTE — Progress Notes (Signed)
[place], [place] ([id number]) Visit Report for 09/07/2019 Arrival Information Details Patient Name: [name], [name] [facility] Date of Service: 09/07/2019 11:00 AM Medical Record Number: [id number] Patient Account Number: [id number] Date of Birth/Sex: Jun 05, 1930 (86 y.o. F) Treating RN: Army Melia Primary Care Leslyn Monda: Emily Filbert Other Clinician: Referring Fey Coghill: Emily Filbert Treating Jelisha Weed/Extender: Tito Dine in Treatment: 20 Visit Information History Since Last Visit Added or deleted any medications: No Patient Arrived: [place] Any new allergies or adverse reactions: No Arrival Time: 10:38 Had a fall or experienced change in No Accompanied By: self activities of daily living that may affect Transfer Assistance: None risk of falls: Patient Has Alerts: Yes Signs or symptoms of abuse/neglect since last visito No Patient Alerts: Patient on Blood Thinner Hospitalized since last visit: No ABI L 1.19 R 0.48 03/2019 Has Dressing in Place as Prescribed: Yes 2-325mg  Aspirin daily TBI R .13 L .42 Pain Present Now: No Electronic Signature(s) Signed: 09/07/2019 11:09:27 AM By: Army Melia Entered By: Army Melia on 09/07/2019 10:38:44 Sublette, [name] ([id number]) -------------------------------------------------------------------------------- Encounter Discharge Information Details Patient Name: [name] Date of Service: 09/07/2019 11:00 AM Medical Record Number: [id number] Patient Account Number: [id number] Date of Birth/Sex: 07-10-1930 (86 y.o. F) Treating RN: Cornell Barman Primary Care Audri Kozub: Emily Filbert Other Clinician: Referring Ahuva Poynor: Emily Filbert Treating Larenz Frasier/Extender: Tito Dine in Treatment: 20 Encounter Discharge Information Items Post Procedure Vitals Discharge Condition: Stable Temperature (F): 98.5 Ambulatory Status: Ambulatory Pulse (bpm): 56 Discharge Destination: Home Respiratory Rate (breaths/min): 16 Transportation: Private Auto Blood  Pressure (mmHg): 160/53 Accompanied By: self Schedule Follow-up Appointment: Yes Clinical Summary of Care: Electronic Signature(s) Signed: 09/07/2019 4:39:52 PM By: Gretta Cool, BSN, RN, CWS, Kim RN, BSN Entered By: Gretta Cool, BSN, RN, CWS, Kim on 09/07/2019 10:59:30 [name], [name] ([id number]) -------------------------------------------------------------------------------- Lower Extremity Assessment Details Patient Name: [name] Date of Service: 09/07/2019 11:00 AM Medical Record Number: [id number] Patient Account Number: [id number] Date of Birth/Sex: [birth year] (86 y.o. F) Treating RN: Army Melia Primary Care Sunday Klos: Emily Filbert Other Clinician: Referring Madisin Hasan: Emily Filbert Treating Shakur Lembo/Extender: Ricard Dillon Weeks in Treatment: 20 Edema Assessment Assessed: [Left: No] [Right: No] Edema: [Left: N] [Right: o] Vascular Assessment Pulses: Dorsalis Pedis Palpable: [Right:Yes] Electronic Signature(s) Signed: 09/07/2019 11:09:27 AM By: Army Melia Entered By: Army Melia on 09/07/2019 10:44:30 [name], [name] ([id number]) -------------------------------------------------------------------------------- Multi Wound Chart Details Patient Name: [name] Date of Service: 09/07/2019 11:00 AM Medical Record Number: [id number] Patient Account Number: [id number] Date of Birth/Sex: [birth year] (86 y.o. F) Treating RN: Cornell Barman Primary Care Lemond Griffee: Emily Filbert Other Clinician: Referring Seraiah Nowack: Emily Filbert Treating Lido Maske/Extender: Tito Dine in Treatment: 20 Vital Signs Height(in): 22 Pulse(bpm): 85 Weight(lbs): 195 Blood Pressure(mmHg): 160/53 Body Mass Index(BMI): 29 Temperature(F): 98.5 Respiratory Rate(breaths/min): 16 Photos: [N/A:N/A] Wound Location: Right, Dorsal Foot N/A N/A Wounding Event: Trauma N/A N/A Primary Etiology: Cellulitis N/A N/A Comorbid History: Cataracts, Arrhythmia, Congestive N/A N/A Heart Failure, Hypertension, Type  II Diabetes, Rheumatoid Arthritis Date Acquired: 03/24/2019 N/A N/A Weeks of Treatment: 20 N/A N/A Wound Status: Open N/A N/A Measurements L x W x D (cm) 3.5x3.6x0.2 N/A N/A Area (cm) : 9.896 N/A N/A Volume (cm) : 1.979 N/A N/A % Reduction in Area: -50.00% N/A N/A % Reduction in Volume: -199.80% N/A N/A Classification: Full Thickness Without Exposed N/A N/A Support Structures Exudate Amount: Medium N/A N/A Exudate Type: Serosanguineous N/A N/A Exudate Color: red, brown N/A N/A Wound Margin: Thickened N/A N/A Granulation Amount: Small (1-33%) N/A N/A Granulation Quality: Red N/A N/A Necrotic Amount: Large (67-100%)  N/A N/A Exposed Structures: Fat Layer (Subcutaneous Tissue) N/A N/A Exposed: Yes Fascia: No Tendon: No Muscle: No Joint: No Bone: No Epithelialization: None N/A N/A Treatment Notes Electronic Signature(s) Signed: 09/07/2019 4:39:52 PM By: Gretta Cool, BSN, RN, CWS, Kim RN, BSN Entered By: Gretta Cool, BSN, RN, CWS, Kim on 09/07/2019 10:56:14 [name], [name] ([id number]) -------------------------------------------------------------------------------- Multi-Disciplinary Care Plan Details Patient Name: [name] Date of Service: 09/07/2019 11:00 AM Medical Record Number: [id number] Patient Account Number: [id number] Date of Birth/Sex: 09-05-1930 (86 y.o. F) Treating RN: Cornell Barman Primary Care Leanard Dimaio: Emily Filbert Other Clinician: Referring Murad Staples: Emily Filbert Treating Perel Hauschild/Extender: Tito Dine in Treatment: 20 Active Inactive Necrotic Tissue Nursing Diagnoses: Impaired tissue integrity related to necrotic/devitalized tissue Knowledge deficit related to management of necrotic/devitalized tissue Goals: Necrotic/devitalized tissue will be minimized in the wound bed Date Initiated: 04/20/2019 Target Resolution Date: 05/04/2019 Goal Status: Active Interventions: Assess patient pain level pre-, during and post procedure and prior to discharge Provide  education on necrotic tissue and debridement process Treatment Activities: Apply topical anesthetic as ordered : 04/20/2019 Notes: Venous Leg Ulcer Nursing Diagnoses: Actual venous Insuffiency (use after diagnosis is confirmed) Goals: Patient/caregiver will verbalize understanding of disease process and disease management Date Initiated: 04/20/2019 Target Resolution Date: 05/04/2019 Goal Status: Active Interventions: Assess peripheral edema status every visit. Notes: Wound/Skin Impairment Nursing Diagnoses: Impaired tissue integrity Goals: Patient/caregiver will verbalize understanding of skin care regimen Date Initiated: 04/20/2019 Target Resolution Date: 05/04/2019 Goal Status: Active Ulcer/skin breakdown will have a volume reduction of 30% by week 4 Date Initiated: 04/20/2019 Target Resolution Date: 05/18/2019 Goal Status: Active Interventions: Assess ulceration(s) every visit Treatment Activities: Skin care regimen initiated : 04/20/2019 Topical wound management initiated : 04/20/2019 [name], Dainelle ([id number]) Notes: Electronic Signature(s) Signed: 09/07/2019 4:39:52 PM By: Gretta Cool, BSN, RN, CWS, Kim RN, BSN Entered By: Gretta Cool, BSN, RN, CWS, Kim on 09/07/2019 10:56:06 [name], Skya ([id number]) -------------------------------------------------------------------------------- Pain Assessment Details Patient Name: [name] Date of Service: 09/07/2019 11:00 AM Medical Record Number: [id number] Patient Account Number: [id number] Date of Birth/Sex: Apr 30, 1931 (86 y.o. F) Treating RN: Army Melia Primary Care Farrin Shadle: Emily Filbert Other Clinician: Referring Sharni Negron: Emily Filbert Treating Shterna Laramee/Extender: Tito Dine in Treatment: 20 Active Problems Location of Pain Severity and Description of Pain Patient Has Paino No Site Locations Pain Management and Medication Current Pain Management: Electronic Signature(s) Signed: 09/07/2019 11:09:27 AM By: Army Melia Entered By: Army Melia on 09/07/2019 10:39:15 Kutscher, Jimena ([id number]) -------------------------------------------------------------------------------- Patient/Caregiver Education Details Patient Name: [name] Date of Service: 09/07/2019 11:00 AM Medical Record Number: [id number] Patient Account Number: [id number] Date of Birth/Gender: June 02, 1930 (86 y.o. F) Treating RN: Cornell Barman Primary Care Physician: Emily Filbert Other Clinician: Referring Physician: Emily Filbert Treating Physician/Extender: Tito Dine in Treatment: 20 Education Assessment Education Provided To: Patient Education Topics Provided Wound Debridement: Handouts: Wound Debridement Methods: Demonstration, Explain/Verbal Responses: State content correctly Wound/Skin Impairment: Handouts: Caring for Your Ulcer Methods: Demonstration, Explain/Verbal Responses: State content correctly Electronic Signature(s) Signed: 09/07/2019 4:39:52 PM By: Gretta Cool, BSN, RN, CWS, Kim RN, BSN Entered By: Gretta Cool, BSN, RN, CWS, Kim on 09/07/2019 10:58:33 [name], Saray ([id number]) -------------------------------------------------------------------------------- Wound Assessment Details Patient Name: [name] Date of Service: 09/07/2019 11:00 AM Medical Record Number: [id number] Patient Account Number: [id number] Date of Birth/Sex: May 21, 1930 (86 y.o. F) Treating RN: Army Melia Primary Care Jovanni Eckhart: Emily Filbert Other Clinician: Referring Vikas Wegmann: Emily Filbert Treating Ahmia Colford/Extender: Tito Dine in Treatment: 20 Wound Status Wound Number: 2 Primary Cellulitis Etiology: Wound Location: Right, Dorsal Foot  Wound Open Wounding Event: Trauma Status: Date Acquired: 03/24/2019 Comorbid Cataracts, Arrhythmia, Congestive Heart Failure, Weeks Of Treatment: 20 History: Hypertension, Type II Diabetes, Rheumatoid Arthritis Clustered Wound: No Photos Wound Measurements Length: (cm) 3.5 Width: (cm)  3.6 Depth: (cm) 0.2 Area: (cm) 9.896 Volume: (cm) 1.979 % Reduction in Area: -50% % Reduction in Volume: -199.8% Epithelialization: None Wound Description Classification: Full Thickness Without Exposed Support Structu Wound Margin: Thickened Exudate Amount: Medium Exudate Type: Serosanguineous Exudate Color: red, brown res Foul Odor After Cleansing: No Slough/Fibrino Yes Wound Bed Granulation Amount: Small (1-33%) Exposed Structure Granulation Quality: Red Fascia Exposed: No Necrotic Amount: Large (67-100%) Fat Layer (Subcutaneous Tissue) Exposed: Yes Necrotic Quality: Adherent Slough Tendon Exposed: No Muscle Exposed: No Joint Exposed: No Bone Exposed: No Treatment Notes Wound #2 (Right, Dorsal Foot) Notes Iodofex, kerlix and coban toes to knee Electronic Signature(s) Signed: 09/07/2019 11:09:27 AM By: Collene Gobble, Beauty ([id number]) Entered By: Army Melia on 09/07/2019 10:43:29 Goswami, Kaelynn ([id number]) -------------------------------------------------------------------------------- Vitals Details Patient Name: [name] Date of Service: 09/07/2019 11:00 AM Medical Record Number: [id number] Patient Account Number: [id number] Date of Birth/Sex: March 20, 1931 (86 y.o. F) Treating RN: Army Melia Primary Care Mazal Ebey: Emily Filbert Other Clinician: Referring Kaiah Hosea: Emily Filbert Treating Albie Arizpe/Extender: Tito Dine in Treatment: 20 Vital Signs Time Taken: 10:38 Temperature (F): 98.5 Height (in): 69 Pulse (bpm): 56 Weight (lbs): 195 Respiratory Rate (breaths/min): 16 Body Mass Index (BMI): 28.8 Blood Pressure (mmHg): 160/53 Reference Range: 80 - 120 mg / dl Electronic Signature(s) Signed: 09/07/2019 11:09:27 AM By: Army Melia Entered By: Army Melia on 09/07/2019 10:39:09

## 2019-09-08 NOTE — Progress Notes (Signed)
[name], [name] Visit Report for 09/07/2019 Debridement Details Patient Name: [name], [name] Date of Service: 09/07/2019 11:00 AM Medical Record Number: [id number] Patient Account Number: [id number] Date of Birth/Sex: [birth year] (86 y.o. F) Treating RN: Cornell Barman Primary Care Provider: Emily Filbert Other Clinician: Referring Provider: Emily Filbert Treating Provider/Extender: Tito Dine in Treatment: 20 Debridement Performed for Wound #2 Right,Dorsal Foot Assessment: Performed By: Physician Ricard Dillon, MD Debridement Type: Debridement Level of Consciousness (Pre- Awake and Alert procedure): Pre-procedure Verification/Time Out Yes - 10:56 Taken: Start Time: 10:56 Pain Control: Lidocaine Total Area Debrided (L x W): 3.5 (cm) x 3.6 (cm) = 12.6 (cm) Tissue and other material Viable, Non-Viable, Slough, Subcutaneous, Biofilm, Slough debrided: Level: Skin/Subcutaneous Tissue Debridement Description: Excisional Instrument: Curette Bleeding: None Hemostasis Achieved: Pressure Response to Treatment: Procedure was tolerated well Level of Consciousness (Post- Awake and Alert procedure): Post Debridement Measurements of Total Wound Length: (cm) 3.5 Width: (cm) 3.6 Depth: (cm) 0.3 Volume: (cm) 2.969 Character of Wound/Ulcer Post Debridement: Stable Post Procedure Diagnosis Same as Pre-procedure Electronic Signature(s) Signed: 09/07/2019 4:22:42 PM By: Linton Ham MD Signed: 09/07/2019 4:39:52 PM By: Gretta Cool, BSN, RN, CWS, Kim RN, BSN Entered By: Gretta Cool, BSN, RN, CWS, Kim on 09/07/2019 10:57:25 [name], [name] ([id number]) -------------------------------------------------------------------------------- HPI Details Patient Name: [name] Date of Service: 09/07/2019 11:00 AM Medical Record Number: [id number] Patient Account Number: [id number] Date of Birth/Sex: [birth year] (86 y.o. F) Treating RN: Cornell Barman Primary Care Provider: Emily Filbert Other  Clinician: Referring Provider: Emily Filbert Treating Provider/Extender: Tito Dine in Treatment: 20 History of Present Illness HPI Description: ADMISSION 04/20/2019 This is an 86 year old woman who lives in the independent part of [place]. She is a type II diabetic on oral agents and insulin with a recent hemoglobin A1c of 6.3 on 10/6. She was initially seen by primary care on 11/12 after dropping a heavy walker on her bilateral dorsal feet. She had blisters on the right and left lateral foot. She was given doxycycline. She was seen the next day in [place] clinic on 11/13 continued on the.C. Bilateral foot x-rays were negative. She was admitted to [name] from 11/17 through 11/27 with acute kidney injury I did not research this although the patient tells me that at discharge her kidney function had returned to normal. During the course of this hospitalization it was noted she had wounds on her bilateral feet and her peripheral pulses were nonpalpable. She was evaluated by Dr. Delana Meyer. It was recommended that she consider angiography on the right leg and it was unlikely that she would heal the right leg as her ABI here was 0.48 with monophasic waveforms on the left her ABI was 1.19 with biphasic waveforms. One would wonder if the ABI was artificially elevated on the left. In any case Dr. Cydney Ok note accurately describes the patient's current state of mind. She does not want to rush into an intervention and she is here for our review of her wounds. Noteworthy that when she arrived I think her renal insufficiency was felt to be prerenal. Her GFR on arrival was 22 now 36. Past medical history includes type 2 diabetes with peripheral neuropathy, atrial fibrillation, renal insufficiency We did not calculate ABIs in our clinic this was 0.48 on the right and 1.19 on the left as noted above. Monophasic waveforms on the right biphasic on the left 12/16; patient admitted to the clinic  last week with trauma critical limb ischemia on the right. Today her wound on the left  dorsal foot is healed. She still has the black eschar on the dorsal right foot that we have been using Santyl on. There is apparently some drainage. She arrives in clinic today with more swelling on the right great and the left dorsal surrounding her foot wounds. Some erythema especially on the right. There is some tenderness on the right but not as much as I might expect if this was all infection nevertheless he does have some neuropathy which might be blunting discomfort. She reports no additional trauma. She is at the rehab section of [facility], she lives in the independent part of [place]. She tells me she has some pain in the right leg at night which wakes her up from sleeping although she manages to get back to sleep. She has about 150 foot exercise tolerance and then gets fatigue in her legs. Again I discussed the issue of angiogram with the patient especially on the right. She just will not agree to this. She states she wants the rehab and then go to [name] where a list of her specialists. She was on Levaquin last week we did not prescribe this but she is finished. She describes some nausea and loose stools but does not sound dramatically unwell with this Left dorsal foot had some very superficial areas today I think related to swelling. The big problem is on the dorsal right foot black surface eschar with increased swelling. She does not have any pain or tenderness although she has neuropathy. We have been using Santyl. She has PAD and again I think needs an angiogram. 12/30; 2-week follow-up. The areas on the right dorsal foot. Everything is healed on the left. She is still in the rehab part of [place]. She went to see Dr. Lucky Cowboy on 12/29. He reiterated to her he felt that she needed angiography on the right leg for critical limb ischemia. She seemed to have 2 major concerns that is wanting to see a  nephrologist prior to procedure in consultation. She does not currently have a nephrologist. She also had no confidence in the original arterial studies that were done in the [name] and wanted them redone. I do not think Dr. Lucky Cowboy felt that they needed to be redone and wanted to proceed with angiography. At the end of it he simply stated that he would be glad to do the angiogram at any time the patient agreed to proceed I looked briefly at her kidney function which had normalized by the time she left the [name] in late November. I do not know that the patient has any additional concern at this point other than the reduction in GFR associated with her diabetes and normal aging. 1/27; I have not seen this patient in almost 4 weeks. She was apparently isolated in the [name] facility because of COVID-19. She tells me she was mildly symptomatic but feels better now although she still feels like she is in a "fog" as far as her mental abilities. She states that she is up and walking for short distances with a walker. She would like to go back to her independent apartment. Still using Santyl to the wound The patient is not describing a lot of pain. She does not describe claudication but her activity sounds very limited especially when she was under isolation. She is still reluctant to go forward with the angiogram that was suggested. Currently she wants to recover fully from Covid infection 2/10; 2-week follow-up. Patient arrives with 100% nonviable surface over  the wound on her right dorsal foot. I changed her to [name] from [place] last week in terms of surface the wound is deteriorated although I think the surface area is about the same. She is not describing claudication walking about 50 feet The patient tells me she is trying to get in with her primary doctor however there is restrictions with regards to the pandemic. She wants to be referred to nephrology and then to vascular surgery at  [facility] I cannot help her with this. I did tell her that her creatinine had normalized via the last lab work I could see done at [facility] in November and her estimated GFR was 53 at that time. By my estimation careful application of contrast dye and preangiogram hydration should be able to get her through the necessary angiography with no complication however she simply does not want to hear this and she is going to put this off until she can get through the [place] system 2/24; 2-week follow-up wound surface area is about the same certainly not worse. She has 100% slough cover we have been using Santyl 3/3; 2-week follow-up. The patient's wound is essentially unchanged. The surface has slough on it no debridement it certainly does not look perfused. She is worried about the erythema she is having in her foot. She had a cramp in her leg last night but is not complaining of any other type of pain. Is also is concerned about some edema that is not well corrected by the diuretic she is on 3/16; 2-week follow-up. The wound is unchanged in appearance and in size. The patient has 100% nonviable surface. She saw Dr. Reginal Lutes, [place] ([id number]) Gari [facility] on 07/21/2019 vascular at [facility]. His statement was "chronic limb threatening ischemia". She was actually planned for a right leg arteriogram and intervention today however she states this was postponed and they are supposed to call her this afternoon to reschedule. Her dressing is being changed at [name] 08/04/2019 upon evaluation today patient has had her evaluation with vascular at [name]. Subsequently she unfortunately does not have good arterial flow into her bilateral lower extremities. She actually is doing worse on the left as compared to the right but neither is effectively receiving good blood flow from the heart down into the lower extremity. She tells me that she actually has her dressing changed on a regular basis at [facility]. She is scheduled  apparently to have intervention tomorrow in regard to her right lower extremity and then they will proceed from there in regard to the left depend on how things go. 4/14; the patient was admitted to [facility] from 3/26 through 08/07/2019. She underwent an aortogram ultimately underwent a right popliteal orbital atherectomy and angioplasty, a right anterior tibial artery orbital atherectomy and angioplasty and a right dorsalis pedis artery angioplasty. She was kept in [name] for hydration secondary to a mild bump in her creatinine. I see she was back in the vascular clinic complaining of fatigue but this was not felt to be secondary to procedure which was not done under general anesthesia. She was apparently using Medihoney, the nurse at [name] is changing the dressing 4/21; the patient has been back to see vascular surgery at [facility] yesterday. She had an improvement in her right arterial studies which were initially done on 3/11. Although her ABI initially was 0.83 on 3/11 yesterday at 0.74 there was a fairly marked improvement in her TBI from 0.13-0.52. The patient is not complaining of any pain.  We have been placing Iodoflex on the wound under kerlix Coban 4/28; patient arrived in clinic with been using Iodoflex. Still requiring debridement but the wound is measuring smaller Electronic Signature(s) Signed: 09/07/2019 4:22:42 PM By: Linton Ham MD Entered By: Linton Ham on 09/07/2019 12:27:11 Beringer, Mackenna ([id number]) -------------------------------------------------------------------------------- Physical Exam Details Patient Name: [name] Date of Service: 09/07/2019 11:00 AM Medical Record Number: [id number] Patient Account Number: [id number] Date of Birth/Sex: [birth year] (86 y.o. F) Treating RN: Cornell Barman Primary Care Provider: Emily Filbert Other Clinician: Referring Provider: Emily Filbert Treating Provider/Extender: Tito Dine in Treatment:  73 Constitutional Patient is hypertensive.. Pulse regular and within target range for patient.Marland Kitchen Respirations regular, non-labored and within target range.. Temperature is normal and within the target range for the patient.Marland Kitchen appears in no distress. Notes Wound exam; again covered in a debris and again a debridement with a #5 curette adherent slough and some nonviable subcutaneous tissue. Once again I am able to get to a very healthy looking wound surface. Her pedal pulses remain palpable Electronic Signature(s) Signed: 09/07/2019 4:22:42 PM By: Linton Ham MD Entered By: Linton Ham on 09/07/2019 12:28:08 [name], [place] ([id number]) -------------------------------------------------------------------------------- Physician Orders Details Patient Name: [name] Date of Service: 09/07/2019 11:00 AM Medical Record Number: [id number] Patient Account Number: [id number] Date of Birth/Sex: 06/20/1930 (86 y.o. F) Treating RN: Cornell Barman Primary Care Provider: Emily Filbert Other Clinician: Referring Provider: Emily Filbert Treating Provider/Extender: Tito Dine in Treatment: 20 Verbal / Phone Orders: No Diagnosis Coding Wound Cleansing Wound #2 Right,Dorsal Foot o Clean wound with Normal Saline. Anesthetic (add to Medication List) Wound #2 Right,Dorsal Foot o Topical Lidocaine 4% cream applied to wound bed prior to debridement (In Clinic Only). Skin Barriers/Peri-Wound Care Wound #2 Right,Dorsal Foot o Moisturizing lotion Primary Wound Dressing Wound #2 Right,Dorsal Foot o Iodoflex Secondary Dressing Wound #2 Right,Dorsal Foot o ABD pad Dressing Change Frequency Wound #2 Right,Dorsal Foot o Change Dressing Monday, Wednesday, Friday Follow-up Appointments Wound #2 Right,Dorsal Foot o Return Appointment in 1 week. Edema Control Wound #2 Right,Dorsal Foot o Kerlix and Coban - Right Lower Extremity o Elevate legs to the level of the heart and pump  ankles as often as possible Electronic Signature(s) Signed: 09/07/2019 4:22:42 PM By: Linton Ham MD Signed: 09/07/2019 4:39:52 PM By: Gretta Cool, BSN, RN, CWS, Kim RN, BSN Entered By: Gretta Cool, BSN, RN, CWS, Kim on 09/07/2019 10:57:52 [name], [place] ([id number]) -------------------------------------------------------------------------------- Progress Note Details Patient Name: [name] Date of Service: 09/07/2019 11:00 AM Medical Record Number: [id number] Patient Account Number: [id number] Date of Birth/Sex: 03-30-1931 (86 y.o. F) Treating RN: Cornell Barman Primary Care Provider: Emily Filbert Other Clinician: Referring Provider: Emily Filbert Treating Provider/Extender: Tito Dine in Treatment: 20 Subjective History of Present Illness (HPI) ADMISSION 04/20/2019 This is an 86 year old woman who lives in the independent part of [place]. She is a type II diabetic on oral agents and insulin with a recent hemoglobin A1c of 6.3 on 10/6. She was initially seen by primary care on 11/12 after dropping a heavy walker on her bilateral dorsal feet. She had blisters on the right and left lateral foot. She was given doxycycline. She was seen the next day in [place] clinic on 11/13 continued on the.C. Bilateral foot x-rays were negative. She was admitted to [name] from 11/17 through 11/27 with acute kidney injury I did not research this although the patient tells me that at discharge her kidney function had returned to normal. During the course of this hospitalization  it was noted she had wounds on her bilateral feet and her peripheral pulses were nonpalpable. She was evaluated by Dr. Delana Meyer. It was recommended that she consider angiography on the right leg and it was unlikely that she would heal the right leg as her ABI here was 0.48 with monophasic waveforms on the left her ABI was 1.19 with biphasic waveforms. One would wonder if the ABI was artificially elevated on the left. In any case Dr.  Cydney Ok note accurately describes the patient's current state of mind. She does not want to rush into an intervention and she is here for our review of her wounds. Noteworthy that when she arrived I think her renal insufficiency was felt to be prerenal. Her GFR on arrival was 22 now 36. Past medical history includes type 2 diabetes with peripheral neuropathy, atrial fibrillation, renal insufficiency We did not calculate ABIs in our clinic this was 0.48 on the right and 1.19 on the left as noted above. Monophasic waveforms on the right biphasic on the left 12/16; patient admitted to the clinic last week with trauma critical limb ischemia on the right. Today her wound on the left dorsal foot is healed. She still has the black eschar on the dorsal right foot that we have been using Santyl on. There is apparently some drainage. She arrives in clinic today with more swelling on the right great and the left dorsal surrounding her foot wounds. Some erythema especially on the right. There is some tenderness on the right but not as much as I might expect if this was all infection nevertheless he does have some neuropathy which might be blunting discomfort. She reports no additional trauma. She is at the rehab section of [facility], she lives in the independent part of [place]. She tells me she has some pain in the right leg at night which wakes her up from sleeping although she manages to get back to sleep. She has about 150 foot exercise tolerance and then gets fatigue in her legs. Again I discussed the issue of angiogram with the patient especially on the right. She just will not agree to this. She states she wants the rehab and then go to [name] where a list of her specialists. She was on Levaquin last week we did not prescribe this but she is finished. She describes some nausea and loose stools but does not sound dramatically unwell with this Left dorsal foot had some very superficial areas today I  think related to swelling. The big problem is on the dorsal right foot black surface eschar with increased swelling. She does not have any pain or tenderness although she has neuropathy. We have been using Santyl. She has PAD and again I think needs an angiogram. 12/30; 2-week follow-up. The areas on the right dorsal foot. Everything is healed on the left. She is still in the rehab part of [place]. She went to see Dr. Lucky Cowboy on 12/29. He reiterated to her he felt that she needed angiography on the right leg for critical limb ischemia. She seemed to have 2 major concerns that is wanting to see a nephrologist prior to procedure in consultation. She does not currently have a nephrologist. She also had no confidence in the original arterial studies that were done in the [name] and wanted them redone. I do not think Dr. Lucky Cowboy felt that they needed to be redone and wanted to proceed with angiography. At the end of it he simply stated that he would  be glad to do the angiogram at any time the patient agreed to proceed I looked briefly at her kidney function which had normalized by the time she left the [name] in late November. I do not know that the patient has any additional concern at this point other than the reduction in GFR associated with her diabetes and normal aging. 1/27; I have not seen this patient in almost 4 weeks. She was apparently isolated in the [facility] facility because of COVID-19. She tells me she was mildly symptomatic but feels better now although she still feels like she is in a "fog" as far as her mental abilities. She states that she is up and walking for short distances with a walker. She would like to go back to her independent apartment. Still using Santyl to the wound The patient is not describing a lot of pain. She does not describe claudication but her activity sounds very limited especially when she was under isolation. She is still reluctant to go forward with the  angiogram that was suggested. Currently she wants to recover fully from Covid infection 2/10; 2-week follow-up. Patient arrives with 100% nonviable surface over the wound on her right dorsal foot. I changed her to [facility] from [place] last week in terms of surface the wound is deteriorated although I think the surface area is about the same. She is not describing claudication walking about 50 feet The patient tells me she is trying to get in with her primary doctor however there is restrictions with regards to the pandemic. She wants to be referred to nephrology and then to vascular surgery at [name] I cannot help her with this. I did tell her that her creatinine had normalized via the last lab work I could see done at [facility] in November and her estimated GFR was 53 at that time. By my estimation careful application of contrast dye and preangiogram hydration should be able to get her through the necessary angiography with no complication however she simply does not want to hear this and she is going to put this off until she can get through the [place] system 2/24; 2-week follow-up wound surface area is about the same certainly not worse. She has 100% slough cover we have been using Santyl 3/3; 2-week follow-up. The patient's wound is essentially unchanged. The surface has slough on it no debridement it certainly does not look perfused. She is worried about the erythema she is having in her foot. She had a cramp in her leg last night but is not complaining of any other type of pain. Is also is concerned about some edema that is not well corrected by the diuretic she is on 3/16; 2-week follow-up. The wound is unchanged in appearance and in size. The patient has 100% nonviable surface. She saw Dr. Reginal Lutes, [place] ([id number]) Gari Crown on 07/21/2019 vascular at [name]. His statement was "chronic limb threatening ischemia". She was actually planned for a right leg arteriogram and intervention today  however she states this was postponed and they are supposed to call her this afternoon to reschedule. Her dressing is being changed at [name] 08/04/2019 upon evaluation today patient has had her evaluation with vascular at [name]. Subsequently she unfortunately does not have good arterial flow into her bilateral lower extremities. She actually is doing worse on the left as compared to the right but neither is effectively receiving good blood flow from the heart down into the lower extremity. She tells me that she actually  has her dressing changed on a regular basis at [facility]. She is scheduled apparently to have intervention tomorrow in regard to her right lower extremity and then they will proceed from there in regard to the left depend on how things go. 4/14; the patient was admitted to [facility] from 3/26 through 08/07/2019. She underwent an aortogram ultimately underwent a right popliteal orbital atherectomy and angioplasty, a right anterior tibial artery orbital atherectomy and angioplasty and a right dorsalis pedis artery angioplasty. She was kept in [name] for hydration secondary to a mild bump in her creatinine. I see she was back in the vascular clinic complaining of fatigue but this was not felt to be secondary to procedure which was not done under general anesthesia. She was apparently using Medihoney, the nurse at [facility] is changing the dressing 4/21; the patient has been back to see vascular surgery at [facility] yesterday. She had an improvement in her right arterial studies which were initially done on 3/11. Although her ABI initially was 0.83 on 3/11 yesterday at 0.74 there was a fairly marked improvement in her TBI from 0.13-0.52. The patient is not complaining of any pain. We have been placing Iodoflex on the wound under kerlix Coban 4/28; patient arrived in clinic with been using Iodoflex. Still requiring debridement but the wound is measuring  smaller Objective Constitutional Patient is hypertensive.. Pulse regular and within target range for patient.Marland Kitchen Respirations regular, non-labored and within target range.. Temperature is normal and within the target range for the patient.Marland Kitchen appears in no distress. Vitals Time Taken: 10:38 AM, Height: 69 in, Weight: 195 lbs, BMI: 28.8, Temperature: 98.5 F, Pulse: 56 bpm, Respiratory Rate: 16 breaths/min, Blood Pressure: 160/53 mmHg. General Notes: Wound exam; again covered in a debris and again a debridement with a #5 curette adherent slough and some nonviable subcutaneous tissue. Once again I am able to get to a very healthy looking wound surface. Her pedal pulses remain palpable Integumentary (Hair, Skin) Wound #2 status is Open. Original cause of wound was Trauma. The wound is located on the Right,Dorsal Foot. The wound measures 3.5cm length x 3.6cm width x 0.2cm depth; 9.896cm^2 area and 1.979cm^3 volume. There is Fat Layer (Subcutaneous Tissue) Exposed exposed. There is a medium amount of serosanguineous drainage noted. The wound margin is thickened. There is small (1-33%) red granulation within the wound bed. There is a large (67-100%) amount of necrotic tissue within the wound bed including Adherent Slough. Procedures Wound #2 Pre-procedure diagnosis of Wound #2 is a Cellulitis located on the Right,Dorsal Foot . There was a Excisional Skin/Subcutaneous Tissue Debridement with a total area of 12.6 sq cm performed by Ricard Dillon, MD. With the following instrument(s): Curette to remove Viable and Non-Viable tissue/material. Material removed includes Subcutaneous Tissue, Slough, and Biofilm after achieving pain control using Lidocaine. No specimens were taken. A time out was conducted at 10:56, prior to the start of the procedure. There was no bleeding. The procedure was tolerated well. Post Debridement Measurements: 3.5cm length x 3.6cm width x 0.3cm depth; 2.969cm^3 volume. Character  of Wound/Ulcer Post Debridement is stable. Post procedure Diagnosis Wound #2: Same as Pre-Procedure Plan Wound Cleansing: Wound #2 Right,Dorsal Foot: Clean wound with Normal Saline. Anesthetic (add to Medication List): Wound #2 Right,Dorsal Foot: Topical Lidocaine 4% cream applied to wound bed prior to debridement (In Clinic Only). Harrison, Lamaria ([id number]) Skin Barriers/Peri-Wound Care: Wound #2 Right,Dorsal Foot: Moisturizing lotion Primary Wound Dressing: Wound #2 Right,Dorsal Foot: Iodoflex Secondary Dressing: Wound #2 Right,Dorsal Foot:  ABD pad Dressing Change Frequency: Wound #2 Right,Dorsal Foot: Change Dressing Monday, Wednesday, Friday Follow-up Appointments: Wound #2 Right,Dorsal Foot: Return Appointment in 1 week. Edema Control: Wound #2 Right,Dorsal Foot: Kerlix and Coban - Right Lower Extremity Elevate legs to the level of the heart and pump ankles as often as possible 1. I do not see any reason to change the dressing which is Iodoflex/ABDs/kerlix and Coban 2. We gave her Curlex and Coban at a time before her revascularization. If necessary I think we could probably move to a 3 layer wrap Electronic Signature(s) Signed: 09/07/2019 4:22:42 PM By: Linton Ham MD Entered By: Linton Ham on 09/07/2019 12:29:14 Flynt, Azusena ([id number]) -------------------------------------------------------------------------------- SuperBill Details Patient Name: [name] Date of Service: 09/07/2019 Medical Record Number: [id number] Patient Account Number: [id number] Date of Birth/Sex: 03/12/1931 (86 y.o. 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 E11.42 Type 2 diabetes mellitus with diabetic  polyneuropathy L97.528 Non-pressure chronic ulcer of other part of left foot with other specified severity E11.621 Type 2 diabetes mellitus with foot ulcer L97.518 Non-pressure chronic ulcer of other part of right foot with other specified severity Facility Procedures CPT4 Code: [id number] Description: [id number] - DEB SUBQ TISSUE 20 SQ CM/< Modifier: Quantity: 1 CPT4 Code: Description: ICD-10 Diagnosis Description L97.518 Non-pressure chronic ulcer of other part of right foot with other specifi Modifier: ed severity Quantity: Physician Procedures CPT4 Code: [id number] Description: 11042 - WC PHYS SUBQ TISS 20 SQ CM Modifier: Quantity: 1 CPT4 Code: Description: ICD-10 Diagnosis Description L97.518 Non-pressure chronic ulcer of other part of right foot with other specifi Modifier: ed severity Quantity: Electronic Signature(s) Signed: 09/07/2019 4:22:42 PM By: Linton Ham MD Entered By: Linton Ham on 09/07/2019 12:29:47

## 2019-09-14 ENCOUNTER — Encounter: Payer: Medicare Other | Attending: Internal Medicine | Admitting: Internal Medicine

## 2019-09-14 ENCOUNTER — Other Ambulatory Visit: Payer: Self-pay

## 2019-09-14 DIAGNOSIS — I110 Hypertensive heart disease with heart failure: Secondary | ICD-10-CM | POA: Diagnosis not present

## 2019-09-14 DIAGNOSIS — I509 Heart failure, unspecified: Secondary | ICD-10-CM | POA: Insufficient documentation

## 2019-09-14 DIAGNOSIS — M069 Rheumatoid arthritis, unspecified: Secondary | ICD-10-CM | POA: Insufficient documentation

## 2019-09-14 DIAGNOSIS — L97518 Non-pressure chronic ulcer of other part of right foot with other specified severity: Secondary | ICD-10-CM | POA: Insufficient documentation

## 2019-09-14 DIAGNOSIS — I4891 Unspecified atrial fibrillation: Secondary | ICD-10-CM | POA: Diagnosis not present

## 2019-09-14 DIAGNOSIS — E11621 Type 2 diabetes mellitus with foot ulcer: Secondary | ICD-10-CM | POA: Insufficient documentation

## 2019-09-14 DIAGNOSIS — E1136 Type 2 diabetes mellitus with diabetic cataract: Secondary | ICD-10-CM | POA: Insufficient documentation

## 2019-09-14 DIAGNOSIS — E1152 Type 2 diabetes mellitus with diabetic peripheral angiopathy with gangrene: Secondary | ICD-10-CM | POA: Diagnosis not present

## 2019-09-14 DIAGNOSIS — Z8616 Personal history of COVID-19: Secondary | ICD-10-CM | POA: Diagnosis not present

## 2019-09-14 DIAGNOSIS — L97528 Non-pressure chronic ulcer of other part of left foot with other specified severity: Secondary | ICD-10-CM | POA: Insufficient documentation

## 2019-09-14 DIAGNOSIS — Z794 Long term (current) use of insulin: Secondary | ICD-10-CM | POA: Diagnosis not present

## 2019-09-14 DIAGNOSIS — E1142 Type 2 diabetes mellitus with diabetic polyneuropathy: Secondary | ICD-10-CM | POA: Insufficient documentation

## 2019-09-15 NOTE — Progress Notes (Signed)
[place], [place] ([id number]) Visit Report for 09/14/2019 Arrival Information Details Patient Name: [name] [facility] Date of Service: 09/14/2019 2:45 PM Medical Record Number: [id number] Patient Account Number: [id number] Date of Birth/Sex: [birth year] (86 y.o. F) Treating RN: Cornell Barman Primary Care Paymon Rosensteel: Emily Filbert Other Clinician: Referring Dallis Czaja: Emily Filbert Treating Leomia Blake/Extender: Tito Dine in Treatment: 21 Visit Information History Since Last Visit Added or deleted any medications: No Patient Arrived: [place] Any new allergies or adverse reactions: No Arrival Time: 14:48 Had a fall or experienced change in No Accompanied By: self activities of daily living that may affect Transfer Assistance: None risk of falls: Patient Identification Verified: Yes Signs or symptoms of abuse/neglect since last visito No Secondary Verification Process Completed: Yes Hospitalized since last visit: No Patient Has Alerts: Yes Implantable device outside of the clinic excluding No Patient Alerts: Patient on Blood Thinner cellular tissue based products placed in the center ABI L 1.19 R 0.48 03/2019 since last visit: 2-325mg  Aspirin daily Has Dressing in Place as Prescribed: Yes TBI R .13 L .42 Pain Present Now: No Electronic Signature(s) Signed: 09/14/2019 4:20:30 PM By: Lorine Bears RCP, RRT, CHT Entered By: Lorine Bears on 09/14/2019 14:49:13 [place], [place] ([id number]) -------------------------------------------------------------------------------- Encounter Discharge Information Details Patient Name: [name] Date of Service: 09/14/2019 2:45 PM Medical Record Number: [id number] Patient Account Number: [id number] Date of Birth/Sex: [birth year] (86 y.o. F) Treating RN: Cornell Barman Primary Care Baruch Lewers: Emily Filbert Other Clinician: Referring Placido Hangartner: Emily Filbert Treating Xachary Hambly/Extender: Tito Dine in Treatment: 21 Encounter  Discharge Information Items Post Procedure Vitals Discharge Condition: Stable Temperature (F): 98.5 Ambulatory Status: Ambulatory Pulse (bpm): 62 Discharge Destination: Home Respiratory Rate (breaths/min): 18 Transportation: Private Auto Blood Pressure (mmHg): 156/63 Accompanied By: self Schedule Follow-up Appointment: No Clinical Summary of Care: Electronic Signature(s) Signed: 09/14/2019 5:22:43 PM By: Gretta Cool, BSN, RN, CWS, Kim RN, BSN Entered By: Gretta Cool, BSN, RN, CWS, Kim on 09/14/2019 15:15:06 [name], [place] ([id number]) -------------------------------------------------------------------------------- Lower Extremity Assessment Details Patient Name: [name] Date of Service: 09/14/2019 2:45 PM Medical Record Number: [id number] Patient Account Number: [id number] Date of Birth/Sex: 22-Jun-1930 (86 y.o. F) Treating RN: Montey Hora Primary Care Zaron Zwiefelhofer: Emily Filbert Other Clinician: Referring Kahleel Fadeley: Emily Filbert Treating Gala Padovano/Extender: Tito Dine in Treatment: 21 Edema Assessment Assessed: [Left: No] [Right: No] Edema: [Left: Ye] [Right: s] Calf Left: Right: Point of Measurement: 34 cm From Medial Instep cm 39 cm Ankle Left: Right: Point of Measurement: 10 cm From Medial Instep cm 27.5 cm Vascular Assessment Pulses: Dorsalis Pedis Palpable: [Right:Yes] Electronic Signature(s) Signed: 09/14/2019 4:27:51 PM By: Montey Hora Entered By: Montey Hora on 09/14/2019 14:53:31 Coba, Marcos ([id number]) -------------------------------------------------------------------------------- Multi Wound Chart Details Patient Name: [name] Date of Service: 09/14/2019 2:45 PM Medical Record Number: [id number] Patient Account Number: [id number] Date of Birth/Sex: 1931/02/16 (86 y.o. F) Treating RN: Cornell Barman Primary Care Shafin Pollio: Emily Filbert Other Clinician: Referring Ericson Nafziger: Emily Filbert Treating Adrianne Shackleton/Extender: Tito Dine in Treatment:  21 Vital Signs Height(in): 65 Pulse(bpm): 7 Weight(lbs): 195 Blood Pressure(mmHg): 156/63 Body Mass Index(BMI): 29 Temperature(F): 98.5 Respiratory Rate(breaths/min): 18 Photos: [N/A:N/A] Wound Location: Right, Dorsal Foot N/A N/A Wounding Event: Trauma N/A N/A Primary Etiology: Cellulitis N/A N/A Comorbid History: Cataracts, Arrhythmia, Congestive N/A N/A Heart Failure, Hypertension, Type II Diabetes, Rheumatoid Arthritis Date Acquired: 03/24/2019 N/A N/A Weeks of Treatment: 21 N/A N/A Wound Status: Open N/A N/A Measurements L x W x D (cm) 3.4x3.3x0.2 N/A N/A Area (cm) : 8.812 N/A N/A Volume (cm) : 1.762  N/A N/A % Reduction in Area: -33.60% N/A N/A % Reduction in Volume: -167.00% N/A N/A Classification: Full Thickness Without Exposed N/A N/A Support Structures Exudate Amount: Medium N/A N/A Exudate Type: Serosanguineous N/A N/A Exudate Color: red, brown N/A N/A Wound Margin: Thickened N/A N/A Granulation Amount: Medium (34-66%) N/A N/A Granulation Quality: Red N/A N/A Necrotic Amount: Medium (34-66%) N/A N/A Exposed Structures: Fat Layer (Subcutaneous Tissue) N/A N/A Exposed: Yes Fascia: No Tendon: No Muscle: No Joint: No Bone: No Epithelialization: None N/A N/A Debridement: Debridement - Excisional N/A N/A Pre-procedure Verification/Time 15:12 N/A N/A Out Taken: Pain Control: Lidocaine N/A N/A Tissue Debrided: Subcutaneous, Slough N/A N/A Level: Skin/Subcutaneous Tissue N/A N/A Debridement Area (sq cm): 11.22 N/A N/A Instrument: Curette N/A N/A Bleeding: Minimum N/A N/A Hemostasis Achieved: Pressure N/A N/A Debridement Treatment Procedure was tolerated well N/A N/A ResponseTawni Carnes, [place] ([id number]) Post Debridement 3.4x3.3x0.3 N/A N/A Measurements L x W x D (cm) Post Debridement Volume: 2.644 N/A N/A (cm) Procedures Performed: Debridement N/A N/A Treatment Notes Wound #2 (Right, Dorsal Foot) Notes Iodofex, kerlix and coban toes to  knee Electronic Signature(s) Signed: 09/14/2019 4:28:02 PM By: Linton Ham MD Entered By: Linton Ham on 09/14/2019 15:19:59 Jorge, Cicily ([id number]) -------------------------------------------------------------------------------- Multi-Disciplinary Care Plan Details Patient Name: [name] Date of Service: 09/14/2019 2:45 PM Medical Record Number: [id number] Patient Account Number: [id number] Date of Birth/Sex: 05/01/1931 (86 y.o. F) Treating RN: Cornell Barman Primary Care Geniene List: Emily Filbert Other Clinician: Referring Wyona Neils: Emily Filbert Treating Analee Montee/Extender: Tito Dine in Treatment: 21 Active Inactive Necrotic Tissue Nursing Diagnoses: Impaired tissue integrity related to necrotic/devitalized tissue Knowledge deficit related to management of necrotic/devitalized tissue Goals: Necrotic/devitalized tissue will be minimized in the wound bed Date Initiated: 04/20/2019 Target Resolution Date: 05/04/2019 Goal Status: Active Interventions: Assess patient pain level pre-, during and post procedure and prior to discharge Provide education on necrotic tissue and debridement process Treatment Activities: Apply topical anesthetic as ordered : 04/20/2019 Notes: Venous Leg Ulcer Nursing Diagnoses: Actual venous Insuffiency (use after diagnosis is confirmed) Goals: Patient/caregiver will verbalize understanding of disease process and disease management Date Initiated: 04/20/2019 Target Resolution Date: 05/04/2019 Goal Status: Active Interventions: Assess peripheral edema status every visit. Notes: Wound/Skin Impairment Nursing Diagnoses: Impaired tissue integrity Goals: Patient/caregiver will verbalize understanding of skin care regimen Date Initiated: 04/20/2019 Target Resolution Date: 05/04/2019 Goal Status: Active Ulcer/skin breakdown will have a volume reduction of 30% by week 4 Date Initiated: 04/20/2019 Target Resolution Date: 05/18/2019 Goal  Status: Active Interventions: Assess ulceration(s) every visit Treatment Activities: Skin care regimen initiated : 04/20/2019 Topical wound management initiated : 04/20/2019 [name], Derinda ([id number]) Notes: Electronic Signature(s) Signed: 09/14/2019 5:22:43 PM By: Gretta Cool, BSN, RN, CWS, Kim RN, BSN Entered By: Gretta Cool, BSN, RN, CWS, Kim on 09/14/2019 15:11:42 [name], Ranesha ([id number]) -------------------------------------------------------------------------------- Pain Assessment Details Patient Name: [name] Date of Service: 09/14/2019 2:45 PM Medical Record Number: [id number] Patient Account Number: [id number] Date of Birth/Sex: 11/08/1930 (86 y.o. F) Treating RN: Montey Hora Primary Care Basir Niven: Emily Filbert Other Clinician: Referring Kelyse Pask: Emily Filbert Treating Johann Santone/Extender: Tito Dine in Treatment: 21 Active Problems Location of Pain Severity and Description of Pain Patient Has Paino No Site Locations Pain Management and Medication Current Pain Management: Electronic Signature(s) Signed: 09/14/2019 4:27:51 PM By: Montey Hora Entered By: Montey Hora on 09/14/2019 14:52:24 Willow, Jennifier ([id number]) -------------------------------------------------------------------------------- Patient/Caregiver Education Details Patient Name: [name] Date of Service: 09/14/2019 2:45 PM Medical Record Number: [id number] Patient Account Number: [id number] Date of Birth/Gender: [birth year] (86 y.o. F)  Treating RN: Cornell Barman Primary Care Physician: Emily Filbert Other Clinician: Referring Physician: Emily Filbert Treating Physician/Extender: Tito Dine in Treatment: 21 Education Assessment Education Provided To: Patient Education Topics Provided Wound Debridement: Handouts: Wound Debridement Methods: Demonstration, Explain/Verbal Responses: State content correctly Wound/Skin Impairment: Handouts: Caring for Your Ulcer Methods: Demonstration,  Explain/Verbal Responses: State content correctly Electronic Signature(s) Signed: 09/14/2019 5:22:43 PM By: Gretta Cool, BSN, RN, CWS, Kim RN, BSN Entered By: Gretta Cool, BSN, RN, CWS, Kim on 09/14/2019 15:14:11 [name], [place] ([id number]) -------------------------------------------------------------------------------- Wound Assessment Details Patient Name: [name] Date of Service: 09/14/2019 2:45 PM Medical Record Number: [id number] Patient Account Number: [id number] Date of Birth/Sex: 1930/05/31 (86 y.o. F) Treating RN: Montey Hora Primary Care Valeen Borys: Emily Filbert Other Clinician: Referring Folashade Gamboa: Emily Filbert Treating Arash Karstens/Extender: Tito Dine in Treatment: 21 Wound Status Wound Number: 2 Primary Cellulitis Etiology: Wound Location: Right, Dorsal Foot Wound Open Wounding Event: Trauma Status: Date Acquired: 03/24/2019 Comorbid Cataracts, Arrhythmia, Congestive Heart Failure, Weeks Of Treatment: 21 History: Hypertension, Type II Diabetes, Rheumatoid Arthritis Clustered Wound: No Photos Wound Measurements Length: (cm) 3.4 Width: (cm) 3.3 Depth: (cm) 0.2 Area: (cm) 8.812 Volume: (cm) 1.762 % Reduction in Area: -33.6% % Reduction in Volume: -167% Epithelialization: None Tunneling: No Undermining: No Wound Description Classification: Full Thickness Without Exposed Support Structu Wound Margin: Thickened Exudate Amount: Medium Exudate Type: Serosanguineous Exudate Color: red, brown res Foul Odor After Cleansing: No Slough/Fibrino Yes Wound Bed Granulation Amount: Medium (34-66%) Exposed Structure Granulation Quality: Red Fascia Exposed: No Necrotic Amount: Medium (34-66%) Fat Layer (Subcutaneous Tissue) Exposed: Yes Necrotic Quality: Adherent Slough Tendon Exposed: No Muscle Exposed: No Joint Exposed: No Bone Exposed: No Treatment Notes Wound #2 (Right, Dorsal Foot) Notes Iodofex, kerlix and coban toes to knee Electronic Signature(s) Signed:  09/14/2019 4:27:51 PM By: Teola Bradley, Jandy ([id number]) Entered By: Montey Hora on 09/14/2019 14:56:32 Boughner, Beautif[place] ([id number]) -------------------------------------------------------------------------------- Vitals Details Patient Name: [name] Date of Service: 09/14/2019 2:45 PM Medical Record Number: [id number] Patient Account Number: [id number] Date of Birth/Sex: [birth year] (86 y.o. F) Treating RN: Cornell Barman Primary Care Dorrance Sellick: Emily Filbert Other Clinician: Referring Mariusz Jubb: Emily Filbert Treating Advait Buice/Extender: Tito Dine in Treatment: 21 Vital Signs Time Taken: 14:45 Temperature (F): 98.5 Height (in): 69 Pulse (bpm): 62 Weight (lbs): 195 Respiratory Rate (breaths/min): 18 Body Mass Index (BMI): 28.8 Blood Pressure (mmHg): 156/63 Reference Range: 80 - 120 mg / dl Electronic Signature(s) Signed: 09/14/2019 4:20:30 PM By: Lorine Bears RCP, RRT, CHT Entered By: Lorine Bears on 09/14/2019 14:50:43

## 2019-09-15 NOTE — Progress Notes (Signed)
[name] ([id number]) Visit Report for 09/14/2019 Debridement Details Patient Name: [name], [name] [facility] Date of Service: 09/14/2019 2:45 PM Medical Record Number: [id number] Patient Account Number: [id number] Date of Birth/Sex: 05/24/1930 (86 y.o. F) Treating RN: [name] Primary Care Provider: Emily [name] Other Clinician: Referring Provider: Emily [name] Treating Provider/Extender: [name] in Treatment: 21 Debridement Performed for Wound #2 Right,Dorsal Foot Assessment: Performed By: Physician [name] Dillon, MD Debridement Type: Debridement Level of Consciousness (Pre- Awake and Alert procedure): Pre-procedure Verification/Time Out Yes - 15:12 Taken: Start Time: 15:12 Pain Control: Lidocaine Total Area Debrided (L x W): 3.4 (cm) x 3.3 (cm) = 11.22 (cm) Tissue and other material Viable, Non-Viable, Slough, Subcutaneous, Slough debrided: Level: Skin/Subcutaneous Tissue Debridement Description: Excisional Instrument: Curette Bleeding: Minimum Hemostasis Achieved: Pressure Response to Treatment: Procedure was tolerated well Level of Consciousness (Post- Awake and Alert procedure): Post Debridement Measurements of Total Wound Length: (cm) 3.4 Width: (cm) 3.3 Depth: (cm) 0.3 Volume: (cm) 2.644 Character of Wound/Ulcer Post Debridement: Stable Post Procedure Diagnosis Same as Pre-procedure Electronic Signature(s) Signed: 09/14/2019 4:28:02 PM By: [name] Ham MD Signed: 09/14/2019 5:22:43 PM By: [name], BSN, RN, CWS, Kim RN, BSN Entered By: [name] on 09/14/2019 15:20:11 [name], [name] ([id number]) -------------------------------------------------------------------------------- HPI Details Patient Name: [name] Date of Service: 09/14/2019 2:45 PM Medical Record Number: [id number] Patient Account Number: [id number] Date of Birth/Sex: Apr 04, 1931 (86 y.o. F) Treating RN: [name] Primary Care Provider: Emily [name] Other Clinician: Referring  Provider: Emily [name] Treating Provider/Extender: [name] in Treatment: 21 History of Present Illness HPI Description: ADMISSION 04/20/2019 This is an 86 year old woman who lives in the independent part of [place]. She is a type II diabetic on oral agents and insulin with a recent hemoglobin A1c of 6.3 on 10/6. She was initially seen by primary care on 11/12 after dropping a heavy walker on her bilateral dorsal feet. She had blisters on the right and left lateral foot. She was given doxycycline. She was seen the next day in [facility] [facility] on 11/13 continued on the.C. Bilateral foot x-rays were negative. She was admitted to hospital from 11/17 through 11/27 with acute kidney injury I did not research this although the patient tells me that at discharge her kidney function had returned to normal. During the course of this hospitalization it was noted she had wounds on her bilateral feet and her peripheral pulses were nonpalpable. She was evaluated by Dr. Delana [name]. It was recommended that she consider angiography on the right leg and it was unlikely that she would heal the right leg as her ABI here was 0.48 with monophasic waveforms on the left her ABI was 1.19 with biphasic waveforms. One would wonder if the ABI was artificially elevated on the left. In any case Dr. Cydney [name] note accurately describes the patient's current state of mind. She does not want to rush into an intervention and she is here for our review of her wounds. Noteworthy that when she arrived I think her renal insufficiency was felt to be prerenal. Her GFR on arrival was 22 now 36. Past medical history includes type 2 diabetes with peripheral neuropathy, atrial fibrillation, renal insufficiency We did not calculate ABIs in our clinic this was 0.48 on the right and 1.19 on the left as noted above. Monophasic waveforms on the right biphasic on the left 12/16; patient admitted to the clinic last week with trauma  critical limb ischemia on the right. Today her wound on the left dorsal foot is healed.  She still has the black eschar on the dorsal right foot that we have been using Santyl on. There is apparently some drainage. She arrives in clinic today with more swelling on the right great and the left dorsal surrounding her foot wounds. Some erythema especially on the right. There is some tenderness on the right but not as much as I might expect if this was all infection nevertheless he does have some neuropathy which might be blunting discomfort. She reports no additional trauma. She is at the rehab section of [facility], she lives in the independent part of [place]. She tells me she has some pain in the right leg at night which wakes her up from sleeping although she manages to get back to sleep. She has about 150 foot exercise tolerance and then gets fatigue in her legs. Again I discussed the issue of angiogram with the patient especially on the right. She just will not agree to this. She states she wants the rehab and then go to [facility] where a list of her specialists. She was on Levaquin last week we did not prescribe this but she is finished. She describes some nausea and loose stools but does not sound dramatically unwell with this Left dorsal foot had some very superficial areas today I think related to swelling. The big problem is on the dorsal right foot black surface eschar with increased swelling. She does not have any pain or tenderness although she has neuropathy. We have been using Santyl. She has PAD and again I think needs an angiogram. 12/30; 2-week follow-up. The areas on the right dorsal foot. Everything is healed on the left. She is still in the rehab part of [place]. She went to see [name] on 12/29. He reiterated to her he felt that she needed angiography on the right leg for critical limb ischemia. She seemed to have 2 major concerns that is wanting to see a nephrologist prior to  procedure in consultation. She does not currently have a nephrologist. She also had no confidence in the original arterial studies that were done in the hospital and wanted them redone. I do not think [name] felt that they needed to be redone and wanted to proceed with angiography. At the end of it he simply stated that he would be glad to do the angiogram at any time the patient agreed to proceed I looked briefly at her kidney function which had normalized by the time she left the hospital in late November. I do not know that the patient has any additional concern at this point other than the reduction in GFR associated with her diabetes and normal aging. 1/27; I have not seen this patient in almost 4 weeks. She was apparently isolated in the [facility] facility because of COVID-19. She tells me she was mildly symptomatic but feels better now although she still feels like she is in a "fog" as far as her mental abilities. She states that she is up and walking for short distances with a walker. She would like to go back to her independent apartment. Still using Santyl to the wound The patient is not describing a lot of pain. She does not describe claudication but her activity sounds very limited especially when she was under isolation. She is still reluctant to go forward with the angiogram that was suggested. Currently she wants to recover fully from Covid infection 2/10; 2-week follow-up. Patient arrives with 100% nonviable surface over the wound on her  right dorsal foot. I changed her to [facility] from [place] last week in terms of surface the wound is deteriorated although I think the surface area is about the same. She is not describing claudication walking about 50 feet The patient tells me she is trying to get in with her primary doctor however there is restrictions with regards to the pandemic. She wants to be referred to nephrology and then to vascular surgery at [facility] I cannot help her  with this. I did tell her that her creatinine had normalized via the last lab work I could see done at [facility] in November and her estimated GFR was 53 at that time. By my estimation careful application of contrast dye and preangiogram hydration should be able to get her through the necessary angiography with no complication however she simply does not want to hear this and she is going to put this off until she can get through the [place] system 2/24; 2-week follow-up wound surface area is about the same certainly not worse. She has 100% slough cover we have been using Santyl 3/3; 2-week follow-up. The patient's wound is essentially unchanged. The surface has slough on it no debridement it certainly does not look perfused. She is worried about the erythema she is having in her foot. She had a cramp in her leg last night but is not complaining of any other type of pain. Is also is concerned about some edema that is not well corrected by the diuretic she is on 3/16; 2-week follow-up. The wound is unchanged in appearance and in size. The patient has 100% nonviable surface. She saw Dr. Reginal Lutes, [place] ([id number]) Gari Crown on 07/21/2019 vascular at [facility]. His statement was "chronic limb threatening ischemia". She was actually planned for a right leg arteriogram and intervention today however she states this was postponed and they are supposed to call her this afternoon to reschedule. Her dressing is being changed at [facility] 08/04/2019 upon evaluation today patient has had her evaluation with vascular at [facility]. Subsequently she unfortunately does not have good arterial flow into her bilateral lower extremities. She actually is doing worse on the left as compared to the right but neither is effectively receiving good blood flow from the heart down into the lower extremity. She tells me that she actually has her dressing changed on a regular basis at [facility]. She is scheduled apparently to have  intervention tomorrow in regard to her right lower extremity and then they will proceed from there in regard to the left depend on how things go. 4/14; the patient was admitted to [facility] from 3/26 through 08/07/2019. She underwent an aortogram ultimately underwent a right popliteal orbital atherectomy and angioplasty, a right anterior tibial artery orbital atherectomy and angioplasty and a right dorsalis pedis artery angioplasty. She was kept in hospital for hydration secondary to a mild bump in her creatinine. I see she was back in the vascular clinic complaining of fatigue but this was not felt to be secondary to procedure which was not done under general anesthesia. She was apparently using [employer], the nurse at [facility] is changing the dressing 4/21; the patient has been back to see vascular surgery at [facility] yesterday. She had an improvement in her right arterial studies which were initially done on 3/11. Although her ABI initially was 0.83 on 3/11 yesterday at 0.74 there was a fairly marked improvement in her TBI from 0.13-0.52. The patient is not complaining of any pain. We have been placing  Iodoflex on the wound under kerlix Coban 4/28; patient arrived in clinic with been using Iodoflex. Still requiring debridement but the wound is measuring smaller 5/5; still using Iodoflex Curlex Coban. Dimensions slightly smaller Electronic Signature(s) Signed: 09/14/2019 4:28:02 PM By: [name] Ham MD Entered By: [name] on 09/14/2019 15:20:36 [name], [name] ([id number]) -------------------------------------------------------------------------------- Physical Exam Details Patient Name: [name] Date of Service: 09/14/2019 2:45 PM Medical Record Number: [id number] Patient Account Number: [id number] Date of Birth/Sex: [birth year] (86 y.o. F) Treating RN: [name] Primary Care Provider: Emily [name] Other Clinician: Referring Provider: Emily [name] Treating Provider/Extender: [name] in Treatment: 21 Constitutional Patient is hypertensive.. Pulse regular and within target range for patient.Marland Kitchen Respirations regular, non-labored and within target range.. Temperature is normal and within the target range for the patient.Marland Kitchen appears in no distress. Notes Wound exam; still a light debris is easily comes off with #5 curette removing surface slough. There is no evidence of infection around the wound hemostasis with direct pressure. There was actually minimal bleeding Electronic Signature(s) Signed: 09/14/2019 4:28:02 PM By: [name] Ham MD Entered By: [name] on 09/14/2019 15:21:38 [name], [name] ([id number]) -------------------------------------------------------------------------------- Physician Orders Details Patient Name: [name] Date of Service: 09/14/2019 2:45 PM Medical Record Number: [id number] Patient Account Number: [id number] Date of Birth/Sex: 1931/02/03 (86 y.o. F) Treating RN: [name] Primary Care Provider: Emily [name] Other Clinician: Referring Provider: Emily [name] Treating Provider/Extender: [name] in Treatment: 30 Verbal / Phone Orders: No Diagnosis Coding Wound Cleansing Wound #2 Right,Dorsal Foot o Clean wound with Normal Saline. Anesthetic (add to Medication List) Wound #2 Right,Dorsal Foot o Topical Lidocaine 4% cream applied to wound bed prior to debridement (In Clinic Only). Skin Barriers/Peri-Wound Care Wound #2 Right,Dorsal Foot o Moisturizing lotion - on leg Primary Wound Dressing Wound #2 Right,Dorsal Foot o Iodoflex Secondary Dressing Wound #2 Right,Dorsal Foot o ABD pad Dressing Change Frequency Wound #2 Right,Dorsal Foot o Change Dressing Monday, Wednesday, Friday Follow-up Appointments Wound #2 Right,Dorsal Foot o Return Appointment in 1 week. Edema Control Wound #2 Right,Dorsal Foot o Kerlix and Coban - Right Lower Extremity o Elevate legs to the level of the heart and  pump ankles as often as possible Electronic Signature(s) Signed: 09/14/2019 4:28:02 PM By: [name] Ham MD Signed: 09/14/2019 5:22:43 PM By: [name], BSN, RN, CWS, Kim RN, BSN Entered By: [name], BSN, RN, CWS, [name] on 09/14/2019 15:13:30 [name], [name] ([id number]) -------------------------------------------------------------------------------- Problem List Details Patient Name: [name] Date of Service: 09/14/2019 2:45 PM Medical Record Number: [id number] Patient Account Number: [id number] Date of Birth/Sex: February 01, 1931 (86 y.o. F) Treating RN: [name] Primary Care Provider: Emily [name] Other Clinician: Referring Provider: Emily [name] Treating Provider/Extender: [name] in Treatment: 21 Active Problems ICD-10 Encounter Code Description Active Date MDM Diagnosis E11.52 Type 2 diabetes mellitus with diabetic peripheral angiopathy with 04/20/2019 No Yes gangrene L97.518 Non-pressure chronic ulcer of other part of right foot with other specified 04/20/2019 No Yes severity E11.42 Type 2 diabetes mellitus with diabetic polyneuropathy 04/20/2019 No Yes E11.621 Type 2 diabetes mellitus with foot ulcer 04/20/2019 No Yes Inactive Problems ICD-10 Code Description Active Date Inactive Date L97.528 Non-pressure chronic ulcer of other part of left foot with other specified 04/20/2019 04/20/2019 severity Resolved Problems Electronic Signature(s) Signed: 09/14/2019 4:28:02 PM By: [name] Ham MD Entered By: [name] on 09/14/2019 15:23:26 [name], [name] ([id number]) -------------------------------------------------------------------------------- Progress Note Details Patient Name: [name] Date of Service: 09/14/2019 2:45 PM Medical Record Number: [id number] Patient Account Number: [id number]  Date of Birth/Sex: [birth year] (86 y.o. F) Treating RN: [name] Primary Care Provider: Emily [name] Other Clinician: Referring Provider: Emily [name] Treating Provider/Extender:  [name] in Treatment: 21 Subjective History of Present Illness (HPI) ADMISSION 04/20/2019 This is an 86 year old woman who lives in the independent part of [place]. She is a type II diabetic on oral agents and insulin with a recent hemoglobin A1c of 6.3 on 10/6. She was initially seen by primary care on 11/12 after dropping a heavy walker on her bilateral dorsal feet. She had blisters on the right and left lateral foot. She was given doxycycline. She was seen the next day in [place] clinic on 11/13 continued on the.C. Bilateral foot x-rays were negative. She was admitted to hospital from 11/17 through 11/27 with acute kidney injury I did not research this although the patient tells me that at discharge her kidney function had returned to normal. During the course of this hospitalization it was noted she had wounds on her bilateral feet and her peripheral pulses were nonpalpable. She was evaluated by Dr. Delana [name]. It was recommended that she consider angiography on the right leg and it was unlikely that she would heal the right leg as her ABI here was 0.48 with monophasic waveforms on the left her ABI was 1.19 with biphasic waveforms. One would wonder if the ABI was artificially elevated on the left. In any case Dr. Cydney [name] note accurately describes the patient's current state of mind. She does not want to rush into an intervention and she is here for our review of her wounds. Noteworthy that when she arrived I think her renal insufficiency was felt to be prerenal. Her GFR on arrival was 22 now 36. Past medical history includes type 2 diabetes with peripheral neuropathy, atrial fibrillation, renal insufficiency We did not calculate ABIs in our clinic this was 0.48 on the right and 1.19 on the left as noted above. Monophasic waveforms on the right biphasic on the left 12/16; patient admitted to the clinic last week with trauma critical limb ischemia on the right. Today her wound  on the left dorsal foot is healed. She still has the black eschar on the dorsal right foot that we have been using Santyl on. There is apparently some drainage. She arrives in clinic today with more swelling on the right great and the left dorsal surrounding her foot wounds. Some erythema especially on the right. There is some tenderness on the right but not as much as I might expect if this was all infection nevertheless he does have some neuropathy which might be blunting discomfort. She reports no additional trauma. She is at the rehab section of [facility], she lives in the independent part of [place]. She tells me she has some pain in the right leg at night which wakes her up from sleeping although she manages to get back to sleep. She has about 150 foot exercise tolerance and then gets fatigue in her legs. Again I discussed the issue of angiogram with the patient especially on the right. She just will not agree to this. She states she wants the rehab and then go to [facility] where a list of her specialists. She was on Levaquin last week we did not prescribe this but she is finished. She describes some nausea and loose stools but does not sound dramatically unwell with this Left dorsal foot had some very superficial areas today I think related to swelling. The big problem is on the  dorsal right foot black surface eschar with increased swelling. She does not have any pain or tenderness although she has neuropathy. We have been using Santyl. She has PAD and again I think needs an angiogram. 12/30; 2-week follow-up. The areas on the right dorsal foot. Everything is healed on the left. She is still in the rehab part of [place]. She went to see [name] on 12/29. He reiterated to her he felt that she needed angiography on the right leg for critical limb ischemia. She seemed to have 2 major concerns that is wanting to see a nephrologist prior to procedure in consultation. She does not currently have  a nephrologist. She also had no confidence in the original arterial studies that were done in the hospital and wanted them redone. I do not think [name] felt that they needed to be redone and wanted to proceed with angiography. At the end of it he simply stated that he would be glad to do the angiogram at any time the patient agreed to proceed I looked briefly at her kidney function which had normalized by the time she left the hospital in late November. I do not know that the patient has any additional concern at this point other than the reduction in GFR associated with her diabetes and normal aging. 1/27; I have not seen this patient in almost 4 weeks. She was apparently isolated in the [facility] facility because of COVID-19. She tells me she was mildly symptomatic but feels better now although she still feels like she is in a "fog" as far as her mental abilities. She states that she is up and walking for short distances with a walker. She would like to go back to her independent apartment. Still using Santyl to the wound The patient is not describing a lot of pain. She does not describe claudication but her activity sounds very limited especially when she was under isolation. She is still reluctant to go forward with the angiogram that was suggested. Currently she wants to recover fully from Covid infection 2/10; 2-week follow-up. Patient arrives with 100% nonviable surface over the wound on her right dorsal foot. I changed her to [facility] from [place] last week in terms of surface the wound is deteriorated although I think the surface area is about the same. She is not describing claudication walking about 50 feet The patient tells me she is trying to get in with her primary doctor however there is restrictions with regards to the pandemic. She wants to be referred to nephrology and then to vascular surgery at [facility] I cannot help her with this. I did tell her that her creatinine had  normalized via the last lab work I could see done at [facility] in November and her estimated GFR was 53 at that time. By my estimation careful application of contrast dye and preangiogram hydration should be able to get her through the necessary angiography with no complication however she simply does not want to hear this and she is going to put this off until she can get through the [place] system 2/24; 2-week follow-up wound surface area is about the same certainly not worse. She has 100% slough cover we have been using Santyl 3/3; 2-week follow-up. The patient's wound is essentially unchanged. The surface has slough on it no debridement it certainly does not look perfused. She is worried about the erythema she is having in her foot. She had a cramp in her leg last night but  is not complaining of any other type of pain. Is also is concerned about some edema that is not well corrected by the diuretic she is on 3/16; 2-week follow-up. The wound is unchanged in appearance and in size. The patient has 100% nonviable surface. She saw Dr. Reginal Lutes, [place] ([id number]) Gari Crown on 07/21/2019 vascular at [facility]. His statement was "chronic limb threatening ischemia". She was actually planned for a right leg arteriogram and intervention today however she states this was postponed and they are supposed to call her this afternoon to reschedule. Her dressing is being changed at [facility] 08/04/2019 upon evaluation today patient has had her evaluation with vascular at [facility]. Subsequently she unfortunately does not have good arterial flow into her bilateral lower extremities. She actually is doing worse on the left as compared to the right but neither is effectively receiving good blood flow from the heart down into the lower extremity. She tells me that she actually has her dressing changed on a regular basis at [facility]. She is scheduled apparently to have intervention tomorrow in regard to her right lower extremity  and then they will proceed from there in regard to the left depend on how things go. 4/14; the patient was admitted to [facility] [place] from 3/26 through 08/07/2019. She underwent an aortogram ultimately underwent a right popliteal orbital atherectomy and angioplasty, a right anterior tibial artery orbital atherectomy and angioplasty and a right dorsalis pedis artery angioplasty. She was kept in hospital for hydration secondary to a mild bump in her creatinine. I see she was back in the vascular clinic complaining of fatigue but this was not felt to be secondary to procedure which was not done under general anesthesia. She was apparently using Medih[employer], the nurse at [facility] is changing the dressing 4/21; the patient has been back to see vascular surgery at [facility] yesterday. She had an improvement in her right arterial studies which were initially done on 3/11. Although her ABI initially was 0.83 on 3/11 yesterday at 0.74 there was a fairly marked improvement in her TBI from 0.13-0.52. The patient is not complaining of any pain. We have been placing Iodoflex on the wound under kerlix Coban 4/28; patient arrived in clinic with been using Iodoflex. Still requiring debridement but the wound is measuring smaller 5/5; still using Iodoflex Curlex Coban. Dimensions slightly smaller Objective Constitutional Patient is hypertensive.. Pulse regular and within target range for patient.Marland Kitchen Respirations regular, non-labored and within target range.. Temperature is normal and within the target range for the patient.Marland Kitchen appears in no distress. Vitals Time Taken: 2:45 PM, Height: 69 in, Weight: 195 lbs, BMI: 28.8, Temperature: 98.5 F, Pulse: 62 bpm, Respiratory Rate: 18 breaths/min, Blood Pressure: 156/63 mmHg. General Notes: Wound exam; still a light debris is easily comes off with #5 curette removing surface slough. There is no evidence of infection around the wound hemostasis with direct pressure. There was actually minimal  bleeding Integumentary (Hair, Skin) Wound #2 status is Open. Original cause of wound was Trauma. The wound is located on the Right,Dorsal Foot. The wound measures 3.4cm length x 3.3cm width x 0.2cm depth; 8.812cm^2 area and 1.762cm^3 volume. There is Fat Layer (Subcutaneous Tissue) Exposed exposed. There is no tunneling or undermining noted. There is a medium amount of serosanguineous drainage noted. The wound margin is thickened. There is medium (34-66%) red granulation within the wound bed. There is a medium (34-66%) amount of necrotic tissue within the wound bed including Adherent Slough. 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 There was a Excisional Skin/Subcutaneous Tissue Debridement with a total area of 11.22 sq cm performed by [name] Dillon, MD. With the following instrument(s): Curette to remove Viable and Non-Viable tissue/material. Material removed includes Subcutaneous Tissue and Slough and after achieving pain control using Lidocaine. No specimens were taken. A time out was conducted at 15:12, prior to the start of the procedure. A Minimum amount of bleeding was controlled with Porr, Brittani ([id number]) Pressure. The procedure was tolerated well. Post Debridement Measurements: 3.4cm length x 3.3cm width x 0.3cm depth; 2.644cm^3 volume. Character of Wound/Ulcer Post Debridement is stable. Post procedure Diagnosis Wound #2: Same as Pre-Procedure Plan Wound Cleansing: Wound #2 Right,Dorsal Foot: Clean wound with Normal Saline. Anesthetic (add  to Medication List): Wound #2 Right,Dorsal Foot: Topical Lidocaine 4% cream applied to wound bed prior to debridement (In Clinic Only). Skin Barriers/Peri-Wound Care: Wound #2 Right,Dorsal Foot: Moisturizing lotion - on leg Primary Wound Dressing: Wound #2 Right,Dorsal Foot: Iodoflex Secondary Dressing: Wound #2 Right,Dorsal Foot: ABD pad Dressing Change Frequency: Wound #2 Right,Dorsal Foot: Change Dressing Monday, Wednesday, Friday Follow-up Appointments: Wound #2 Right,Dorsal Foot: Return Appointment in 1 week. Edema Control: Wound #2 Right,Dorsal Foot: Kerlix and Coban - Right Lower Extremity Elevate legs to the level of the heart and pump ankles as often as possible 1. I am continuing with Iodoflex/ABDs Kerlix Coban Electronic Signature(s) Signed: 09/14/2019 4:28:02 PM By: [name] Ham MD Entered By: [name] on 09/14/2019 15:22:20 Stammen, Brinkley ([id number]) -------------------------------------------------------------------------------- SuperBill Details Patient Name: [name] Date of Service: 09/14/2019 Medical Record Number: [id number] Patient Account Number: [id number] Date of Birth/Sex: 01-Apr-1931 (86 y.o. F) Treating RN: [name] Primary Care Provider: Emily [name] Other Clinician: Referring Provider: Emily [name] Treating Provider/Extender: [name] in Treatment: 21 Diagnosis Coding ICD-10 Codes Code Description E11.52 Type 2 diabetes mellitus with diabetic peripheral angiopathy with gangrene E11.42 Type 2 diabetes mellitus with diabetic polyneuropathy L97.528 Non-pressure chronic ulcer of other part of left foot with other specified severity E11.621 Type 2 diabetes mellitus with foot ulcer L97.518 Non-pressure chronic ulcer of other part of right foot with other specified severity Facility Procedures CPT4 Code: [id number] Description: [id number] - DEB SUBQ TISSUE 20 SQ CM/< Modifier: Quantity: 1 CPT4 Code: Description: ICD-10 Diagnosis  Description L97.518 Non-pressure chronic ulcer of other part of right foot with other specifi Modifier: ed severity Quantity: Physician Procedures CPT4 Code: [id number] Description: 11042 - WC PHYS SUBQ TISS 20 SQ CM Modifier: Quantity: 1 CPT4 Code: Description: ICD-10 Diagnosis Description L97.518 Non-pressure chronic ulcer of other part of right foot with other specifi Modifier: ed severity Quantity: Electronic Signature(s) Signed: 09/14/2019 4:28:02 PM By: [name] Ham MD Entered By: [name] on 09/14/2019 15:22:54

## 2019-09-21 ENCOUNTER — Other Ambulatory Visit: Payer: Self-pay

## 2019-09-21 ENCOUNTER — Encounter: Payer: Medicare Other | Admitting: Internal Medicine

## 2019-09-21 DIAGNOSIS — E11621 Type 2 diabetes mellitus with foot ulcer: Secondary | ICD-10-CM | POA: Diagnosis not present

## 2019-09-21 NOTE — Progress Notes (Addendum)
[place], [place] ([id number]) Visit Report for 09/21/2019 Arrival Information Details Patient Name: [name], [name] [facility] Date of Service: 09/21/2019 11:00 AM Medical Record Number: [id number] Patient Account Number: [id number] Date of Birth/Sex: May 17, 1930 (86 y.o. F) Treating RN: Army Melia Primary Care Benedetta Sundstrom: Emily Filbert Other Clinician: Referring Skyelar Swigart: Emily Filbert Treating Kolyn Rozario/Extender: Tito Dine in Treatment: 36 Visit Information History Since Last Visit Added or deleted any medications: No Patient Arrived: Walker Any new allergies or adverse reactions: No Arrival Time: 11:07 Had a fall or experienced change in No Accompanied By: self activities of daily living that may affect Transfer Assistance: None risk of falls: Patient Identification Verified: Yes Signs or symptoms of abuse/neglect since last visito No Patient Has Alerts: Yes Hospitalized since last visit: No Patient Alerts: Patient on Blood Thinner Has Dressing in Place as Prescribed: Yes ABI L 1.19 R 0.48 03/2019 Pain Present Now: No 2-325mg  Aspirin daily TBI R .13 L .42 Electronic Signature(s) Signed: 09/21/2019 11:15:40 AM By: Army Melia Entered By: Army Melia on 09/21/2019 11:07:35 [name], [name] ([id number]) -------------------------------------------------------------------------------- Clinic Level of Care Assessment Details Patient Name: [name] Date of Service: 09/21/2019 11:00 AM Medical Record Number: [id number] Patient Account Number: [id number] Date of Birth/Sex: 09-Oct-1930 (86 y.o. F) Treating RN: Cornell Barman Primary Care Tyeshia Cornforth: Emily Filbert Other Clinician: Referring Tahjai Schetter: Emily Filbert Treating Kirbi Farrugia/Extender: Tito Dine in Treatment: 22 Clinic Level of Care Assessment Items TOOL 4 Quantity Score []  - Use when only an EandM is performed on FOLLOW-UP visit 0 ASSESSMENTS - Nursing Assessment / Reassessment X - Reassessment of Co-morbidities (includes  updates in patient status) 1 10 X- 1 5 Reassessment of Adherence to Treatment Plan ASSESSMENTS - Wound and Skin Assessment / Reassessment X - Simple Wound Assessment / Reassessment - one wound 1 5 []  - 0 Complex Wound Assessment / Reassessment - multiple wounds []  - 0 Dermatologic / Skin Assessment (not related to wound area) ASSESSMENTS - Focused Assessment []  - Circumferential Edema Measurements - multi extremities 0 []  - 0 Nutritional Assessment / Counseling / Intervention []  - 0 Lower Extremity Assessment (monofilament, tuning fork, pulses) []  - 0 Peripheral Arterial Disease Assessment (using hand held doppler) ASSESSMENTS - Ostomy and/or Continence Assessment and Care []  - Incontinence Assessment and Management 0 []  - 0 Ostomy Care Assessment and Management (repouching, etc.) PROCESS - Coordination of Care X - Simple Patient / Family Education for ongoing care 1 15 []  - 0 Complex (extensive) Patient / Family Education for ongoing care []  - 0 Staff obtains Programmer, systems, Records, Test Results / Process Orders []  - 0 Staff telephones HHA, Nursing Homes / Clarify orders / etc []  - 0 Routine Transfer to another Facility (non-emergent condition) []  - 0 Routine Hospital Admission (non-emergent condition) []  - 0 New Admissions / Biomedical engineer / Ordering NPWT, Apligraf, etc. []  - 0 Emergency Hospital Admission (emergent condition) X- 1 10 Simple Discharge Coordination []  - 0 Complex (extensive) Discharge Coordination PROCESS - Special Needs []  - Pediatric / Minor Patient Management 0 []  - 0 Isolation Patient Management []  - 0 Hearing / Language / Visual special needs []  - 0 Assessment of Community assistance (transportation, D/C planning, etc.) []  - 0 Additional assistance / Altered mentation []  - 0 Support Surface(s) Assessment (bed, cushion, seat, etc.) INTERVENTIONS - Wound Cleansing / Measurement Sampsel, Sarabi ([id number]) X- 1 5 Simple Wound Cleansing -  one wound []  - 0 Complex Wound Cleansing - multiple wounds X- 1 5 Wound Imaging (photographs - any number of wounds) []  -  0 Wound Tracing (instead of photographs) X- 1 5 Simple Wound Measurement - one wound []  - 0 Complex Wound Measurement - multiple wounds INTERVENTIONS - Wound Dressings []  - Small Wound Dressing one or multiple wounds 0 []  - 0 Medium Wound Dressing one or multiple wounds X- 1 20 Large Wound Dressing one or multiple wounds []  - 0 Application of Medications - topical []  - 0 Application of Medications - injection INTERVENTIONS - Miscellaneous []  - External ear exam 0 []  - 0 Specimen Collection (cultures, biopsies, blood, body fluids, etc.) []  - 0 Specimen(s) / Culture(s) sent or taken to Lab for analysis []  - 0 Patient Transfer (multiple staff / Civil Service fast streamer / Similar devices) []  - 0 Simple Staple / Suture removal (25 or less) []  - 0 Complex Staple / Suture removal (26 or more) []  - 0 Hypo / Hyperglycemic Management (close monitor of Blood Glucose) []  - 0 Ankle / Brachial Index (ABI) - do not check if billed separately X- 1 5 Vital Signs Has the patient been seen at the hospital within the last three years: Yes Total Score: 85 Level Of Care: New/Established - Level 3 Electronic Signature(s) Signed: 11/08/2019 12:53:52 PM By: Gretta Cool, BSN, RN, CWS, Kim RN, BSN Entered By: Gretta Cool, BSN, RN, CWS, Kim on 09/21/2019 11:25:42 [name], [name] ([id number]) -------------------------------------------------------------------------------- Encounter Discharge Information Details Patient Name: [name] Date of Service: 09/21/2019 11:00 AM Medical Record Number: [id number] Patient Account Number: [id number] Date of Birth/Sex: 31-May-1930 (86 y.o. F) Treating RN: Cornell Barman Primary Care Yael Coppess: Emily Filbert Other Clinician: Referring Medard Decuir: Emily Filbert Treating Elianis Fischbach/Extender: Tito Dine in Treatment: 22 Encounter Discharge Information  Items Discharge Condition: Stable Ambulatory Status: Walker Discharge Destination: Home Transportation: Private Auto Accompanied By: self Schedule Follow-up Appointment: Yes Clinical Summary of Care: Electronic Signature(s) Signed: 11/08/2019 12:53:52 PM By: Gretta Cool, BSN, RN, CWS, Kim RN, BSN Entered By: Gretta Cool, BSN, RN, CWS, Kim on 09/21/2019 11:26:54 [name], [name] ([id number]) -------------------------------------------------------------------------------- Lower Extremity Assessment Details Patient Name: [name] Date of Service: 09/21/2019 11:00 AM Medical Record Number: [id number] Patient Account Number: [id number] Date of Birth/Sex: 11/13/1930 (86 y.o. F) Treating RN: Army Melia Primary Care Tamarcus Condie: Emily Filbert Other Clinician: Referring Rawan Riendeau: Emily Filbert Treating Agata Lucente/Extender: Ricard Dillon Weeks in Treatment: 22 Edema Assessment Assessed: [Left: No] [Right: No] Edema: [Left: N] [Right: o] Vascular Assessment Pulses: Dorsalis Pedis Palpable: [Right:Yes] Electronic Signature(s) Signed: 09/21/2019 11:15:40 AM By: Army Melia Entered By: Army Melia on 09/21/2019 11:13:20 [name], Jerae ([id number]) -------------------------------------------------------------------------------- Multi Wound Chart Details Patient Name: [name] Date of Service: 09/21/2019 11:00 AM Medical Record Number: [id number] Patient Account Number: [id number] Date of Birth/Sex: November 22, 1930 (86 y.o. F) Treating RN: Cornell Barman Primary Care Corlene Sabia: Emily Filbert Other Clinician: Referring Julyana Woolverton: Emily Filbert Treating Finneus Kaneshiro/Extender: Tito Dine in Treatment: 22 Vital Signs Height(in): 69 Pulse(bpm): Weight(lbs): 195 Blood Pressure(mmHg): Body Mass Index(BMI): 29 Temperature(F): 98.4 Respiratory Rate(breaths/min): 16 Photos: [N/A:N/A] Wound Location: Right, Dorsal Foot N/A N/A Wounding Event: Trauma N/A N/A Primary Etiology: Cellulitis N/A N/A Comorbid  History: Cataracts, Arrhythmia, Congestive N/A N/A Heart Failure, Hypertension, Type II Diabetes, Rheumatoid Arthritis Date Acquired: 03/24/2019 N/A N/A Weeks of Treatment: 22 N/A N/A Wound Status: Open N/A N/A Measurements L x W x D (cm) 3x3x0.2 N/A N/A Area (cm) : 7.069 N/A N/A Volume (cm) : 1.414 N/A N/A % Reduction in Area: -7.20% N/A N/A % Reduction in Volume: -114.20% N/A N/A Classification: Full Thickness Without Exposed N/A N/A Support Structures Exudate Amount: Medium N/A N/A Exudate  Type: Serosanguineous N/A N/A Exudate Color: red, brown N/A N/A Wound Margin: Thickened N/A N/A Granulation Amount: Medium (34-66%) N/A N/A Granulation Quality: Red N/A N/A Necrotic Amount: Medium (34-66%) N/A N/A Exposed Structures: Fat Layer (Subcutaneous Tissue) N/A N/A Exposed: Yes Fascia: No Tendon: No Muscle: No Joint: No Bone: No Epithelialization: None N/A N/A Treatment Notes Wound #2 (Right, Dorsal Foot) Notes IHydrofera Blue Ready Foam, kerlix and coban toes to knee [name], [name] ([id number]) Electronic Signature(s) Signed: 09/22/2019 7:53:18 AM By: Linton Ham MD Entered By: Linton Ham on 09/21/2019 11:34:24 [name], [name] ([id number]) -------------------------------------------------------------------------------- Multi-Disciplinary Care Plan Details Patient Name: [name] Date of Service: 09/21/2019 11:00 AM Medical Record Number: [id number] Patient Account Number: [id number] Date of Birth/Sex: 09-15-1930 (86 y.o. F) Treating RN: Cornell Barman Primary Care Kaliope Quinonez: Emily Filbert Other Clinician: Referring Yusef Lamp: Emily Filbert Treating Javionna Leder/Extender: Tito Dine in Treatment: 22 Active Inactive Necrotic Tissue Nursing Diagnoses: Impaired tissue integrity related to necrotic/devitalized tissue Knowledge deficit related to management of necrotic/devitalized tissue Goals: Necrotic/devitalized tissue will be minimized in the wound bed Date  Initiated: 04/20/2019 Target Resolution Date: 05/04/2019 Goal Status: Active Interventions: Assess patient pain level pre-, during and post procedure and prior to discharge Provide education on necrotic tissue and debridement process Treatment Activities: Apply topical anesthetic as ordered : 04/20/2019 Notes: Venous Leg Ulcer Nursing Diagnoses: Actual venous Insuffiency (use after diagnosis is confirmed) Goals: Patient/caregiver will verbalize understanding of disease process and disease management Date Initiated: 04/20/2019 Target Resolution Date: 05/04/2019 Goal Status: Active Interventions: Assess peripheral edema status every visit. Notes: Wound/Skin Impairment Nursing Diagnoses: Impaired tissue integrity Goals: Patient/caregiver will verbalize understanding of skin care regimen Date Initiated: 04/20/2019 Target Resolution Date: 05/04/2019 Goal Status: Active Ulcer/skin breakdown will have a volume reduction of 30% by week 4 Date Initiated: 04/20/2019 Target Resolution Date: 05/18/2019 Goal Status: Active Interventions: Assess ulceration(s) every visit Treatment Activities: Skin care regimen initiated : 04/20/2019 Topical wound management initiated : 04/20/2019 [name], [name] ([id number]) Notes: Electronic Signature(s) Signed: 11/08/2019 12:53:52 PM By: Gretta Cool, BSN, RN, CWS, Kim RN, BSN Entered By: Gretta Cool, BSN, RN, CWS, Kim on 09/21/2019 11:22:10 [name], [name] ([id number]) -------------------------------------------------------------------------------- Pain Assessment Details Patient Name: [name] Date of Service: 09/21/2019 11:00 AM Medical Record Number: [id number] Patient Account Number: [id number] Date of Birth/Sex: May 15, 1930 (86 y.o. F) Treating RN: Army Melia Primary Care Elsworth Ledin: Emily Filbert Other Clinician: Referring Teliah Buffalo: Emily Filbert Treating Jerzey Komperda/Extender: Tito Dine in Treatment: 22 Active Problems Location of Pain Severity and  Description of Pain Patient Has Paino No Site Locations Pain Management and Medication Current Pain Management: Electronic Signature(s) Signed: 09/21/2019 11:15:40 AM By: Army Melia Entered By: Army Melia on 09/21/2019 11:07:58 [name], [name] ([id number]) -------------------------------------------------------------------------------- Patient/Caregiver Education Details Patient Name: [name] Date of Service: 09/21/2019 11:00 AM Medical Record Number: [id number] Patient Account Number: [id number] Date of Birth/Gender: 1930-05-22 (86 y.o. F) Treating RN: Cornell Barman Primary Care Physician: Emily Filbert Other Clinician: Referring Physician: Emily Filbert Treating Physician/Extender: Tito Dine in Treatment: 22 Education Assessment Education Provided To: Patient Education Topics Provided Wound/Skin Impairment: Handouts: Caring for Your Ulcer Methods: Demonstration Responses: State content correctly Electronic Signature(s) Signed: 11/08/2019 12:53:52 PM By: Gretta Cool, BSN, RN, CWS, Kim RN, BSN Entered By: Gretta Cool, BSN, RN, CWS, Kim on 09/21/2019 11:26:10 [name], [name] ([id number]) -------------------------------------------------------------------------------- Wound Assessment Details Patient Name: [name] Date of Service: 09/21/2019 11:00 AM Medical Record Number: [id number] Patient Account Number: [id number] Date of Birth/Sex: Sep 13, 1930 (86 y.o. F) Treating RN: Army Melia Primary Care Macaila Tahir: Sabra Heck,  Mark Other Clinician: Referring Tiyah Zelenak: Emily Filbert Treating Saumya Hukill/Extender: Tito Dine in Treatment: 22 Wound Status Wound Number: 2 Primary Cellulitis Etiology: Wound Location: Right, Dorsal Foot Wound Open Wounding Event: Trauma Status: Date Acquired: 03/24/2019 Comorbid Cataracts, Arrhythmia, Congestive Heart Failure, Weeks Of Treatment: 22 History: Hypertension, Type II Diabetes, Rheumatoid Arthritis Clustered Wound: No Photos Wound  Measurements Length: (cm) 3 Width: (cm) 3 Depth: (cm) 0.2 Area: (cm) 7.069 Volume: (cm) 1.414 % Reduction in Area: -7.2% % Reduction in Volume: -114.2% Epithelialization: None Wound Description Classification: Full Thickness Without Exposed Support Structu Wound Margin: Thickened Exudate Amount: Medium Exudate Type: Serosanguineous Exudate Color: red, brown res Foul Odor After Cleansing: No Slough/Fibrino Yes Wound Bed Granulation Amount: Medium (34-66%) Exposed Structure Granulation Quality: Red Fascia Exposed: No Necrotic Amount: Medium (34-66%) Fat Layer (Subcutaneous Tissue) Exposed: Yes Necrotic Quality: Adherent Slough Tendon Exposed: No Muscle Exposed: No Joint Exposed: No Bone Exposed: No Electronic Signature(s) Signed: 09/21/2019 11:15:40 AM By: Army Melia Entered By: Army Melia on 09/21/2019 11:12:46 Latella, Sayuri ([id number]) -------------------------------------------------------------------------------- Vitals Details Patient Name: [name] Date of Service: 09/21/2019 11:00 AM Medical Record Number: [id number] Patient Account Number: [id number] Date of Birth/Sex: 11/02/1930 (86 y.o. F) Treating RN: Army Melia Primary Care Robin Pafford: Emily Filbert Other Clinician: Referring Makiyah Zentz: Emily Filbert Treating Rolanda Campa/Extender: Tito Dine in Treatment: 22 Vital Signs Time Taken: 11:07 Temperature (F): 98.4 Height (in): 69 Respiratory Rate (breaths/min): 16 Weight (lbs): 195 Reference Range: 80 - 120 mg / dl Body Mass Index (BMI): 28.8 Electronic Signature(s) Signed: 09/21/2019 11:15:40 AM By: Army Melia Entered By: Army Melia on 09/21/2019 11:07:53

## 2019-09-28 ENCOUNTER — Other Ambulatory Visit: Payer: Self-pay

## 2019-09-28 ENCOUNTER — Encounter: Payer: Medicare Other | Admitting: Internal Medicine

## 2019-09-28 DIAGNOSIS — E11621 Type 2 diabetes mellitus with foot ulcer: Secondary | ICD-10-CM | POA: Diagnosis not present

## 2019-09-29 NOTE — Progress Notes (Signed)
[place], [place] ([id number]) Visit Report for 09/28/2019 Arrival Information Details Patient Name: [name], [name] [facility] Date of Service: 09/28/2019 1:15 PM Medical Record Number: [id number] Patient Account Number: [id number] Date of Birth/Sex: 1931/01/22 (86 y.o. F) Treating RN: Cornell Barman Primary Care Shaniya Tashiro: Emily Filbert Other Clinician: Referring Aubreanna Percle: Emily Filbert Treating Bobbie Valletta/Extender: Tito Dine in Treatment: 23 Visit Information History Since Last Visit Added or deleted any medications: No Patient Arrived: Walker Any new allergies or adverse reactions: No Arrival Time: 13:12 Had a fall or experienced change in No Accompanied By: self activities of daily living that may affect Transfer Assistance: None risk of falls: Patient Identification Verified: Yes Signs or symptoms of abuse/neglect since last visito No Secondary Verification Process Completed: Yes Hospitalized since last visit: No Patient Has Alerts: Yes Implantable device outside of the clinic excluding No Patient Alerts: Patient on Blood Thinner cellular tissue based products placed in the center ABI L 1.19 R 0.48 03/2019 since last visit: 2-325mg  Aspirin daily Has Dressing in Place as Prescribed: Yes TBI R .13 L .42 Has Compression in Place as Prescribed: Yes Pain Present Now: No Electronic Signature(s) Signed: 09/28/2019 4:33:32 PM By: Lorine Bears RCP, RRT, CHT Entered By: Lorine Bears on 09/28/2019 13:13:34 [name], [name] ([id number]) -------------------------------------------------------------------------------- Clinic Level of Care Assessment Details Patient Name: [name] Date of Service: 09/28/2019 1:15 PM Medical Record Number: [id number] Patient Account Number: [id number] Date of Birth/Sex: [birth year] (86 y.o. F) Treating RN: Cornell Barman Primary Care Alease Fait: Emily Filbert Other Clinician: Referring Ridge Lafond: Emily Filbert Treating Azyiah Bo/Extender: Tito Dine in Treatment: 23 Clinic Level of Care Assessment Items TOOL 4 Quantity Score []  - Use when only an EandM is performed on FOLLOW-UP visit 0 ASSESSMENTS - Nursing Assessment / Reassessment X - Reassessment of Co-morbidities (includes updates in patient status) 1 10 X- 1 5 Reassessment of Adherence to Treatment Plan ASSESSMENTS - Wound and Skin Assessment / Reassessment X - Simple Wound Assessment / Reassessment - one wound 1 5 []  - 0 Complex Wound Assessment / Reassessment - multiple wounds []  - 0 Dermatologic / Skin Assessment (not related to wound area) ASSESSMENTS - Focused Assessment []  - Circumferential Edema Measurements - multi extremities 0 []  - 0 Nutritional Assessment / Counseling / Intervention []  - 0 Lower Extremity Assessment (monofilament, tuning fork, pulses) []  - 0 Peripheral Arterial Disease Assessment (using hand held doppler) ASSESSMENTS - Ostomy and/or Continence Assessment and Care []  - Incontinence Assessment and Management 0 []  - 0 Ostomy Care Assessment and Management (repouching, etc.) PROCESS - Coordination of Care X - Simple Patient / Family Education for ongoing care 1 15 []  - 0 Complex (extensive) Patient / Family Education for ongoing care X- 1 10 Staff obtains Programmer, systems, Records, Test Results / Process Orders []  - 0 Staff telephones HHA, Nursing Homes / Clarify orders / etc []  - 0 Routine Transfer to another Facility (non-emergent condition) []  - 0 Routine Hospital Admission (non-emergent condition) []  - 0 New Admissions / Biomedical engineer / Ordering NPWT, Apligraf, etc. []  - 0 Emergency Hospital Admission (emergent condition) X- 1 10 Simple Discharge Coordination []  - 0 Complex (extensive) Discharge Coordination PROCESS - Special Needs []  - Pediatric / Minor Patient Management 0 []  - 0 Isolation Patient Management []  - 0 Hearing / Language / Visual special needs []  - 0 Assessment of Community assistance  (transportation, D/C planning, etc.) []  - 0 Additional assistance / Altered mentation []  - 0 Support Surface(s) Assessment (bed, cushion, seat, etc.) INTERVENTIONS -  Wound Cleansing / Measurement [name], [name] ([id number]) X- 1 5 Simple Wound Cleansing - one wound []  - 0 Complex Wound Cleansing - multiple wounds X- 1 5 Wound Imaging (photographs - any number of wounds) []  - 0 Wound Tracing (instead of photographs) X- 1 5 Simple Wound Measurement - one wound []  - 0 Complex Wound Measurement - multiple wounds INTERVENTIONS - Wound Dressings []  - Small Wound Dressing one or multiple wounds 0 []  - 0 Medium Wound Dressing one or multiple wounds X- 1 20 Large Wound Dressing one or multiple wounds []  - 0 Application of Medications - topical []  - 0 Application of Medications - injection INTERVENTIONS - Miscellaneous []  - External ear exam 0 []  - 0 Specimen Collection (cultures, biopsies, blood, body fluids, etc.) []  - 0 Specimen(s) / Culture(s) sent or taken to Lab for analysis []  - 0 Patient Transfer (multiple staff / Civil Service fast streamer / Similar devices) []  - 0 Simple Staple / Suture removal (25 or less) []  - 0 Complex Staple / Suture removal (26 or more) []  - 0 Hypo / Hyperglycemic Management (close monitor of Blood Glucose) []  - 0 Ankle / Brachial Index (ABI) - do not check if billed separately X- 1 5 Vital Signs Has the patient been seen at the hospital within the last three years: Yes Total Score: 95 Level Of Care: New/Established - Level 3 Electronic Signature(s) Signed: 09/29/2019 9:48:50 AM By: Gretta Cool, BSN, RN, CWS, Kim RN, BSN Entered By: Gretta Cool, BSN, RN, CWS, Kim on 09/28/2019 13:41:47 [name], [name] ([id number]) -------------------------------------------------------------------------------- Encounter Discharge Information Details Patient Name: [name] Date of Service: 09/28/2019 1:15 PM Medical Record Number: [id number] Patient Account Number: [id number] Date of  Birth/Sex: Dec 20, 1930 (86 y.o. F) Treating RN: Cornell Barman Primary Care Stacy Deshler: Emily Filbert Other Clinician: Referring Larell Baney: Emily Filbert Treating Aydden Cumpian/Extender: Tito Dine in Treatment: 51 Encounter Discharge Information Items Discharge Condition: Stable Ambulatory Status: Ambulatory Discharge Destination: Home Transportation: Private Auto Accompanied By: self Schedule Follow-up Appointment: Yes Clinical Summary of Care: Electronic Signature(s) Signed: 09/29/2019 9:48:50 AM By: Gretta Cool, BSN, RN, CWS, Kim RN, BSN Entered By: Gretta Cool, BSN, RN, CWS, Kim on 09/28/2019 13:42:33 [name], [name] ([id number]) -------------------------------------------------------------------------------- Lower Extremity Assessment Details Patient Name: [name] Date of Service: 09/28/2019 1:15 PM Medical Record Number: [id number] Patient Account Number: [id number] Date of Birth/Sex: [birth year] (86 y.o. F) Treating RN: Army Melia Primary Care Jeri Rawlins: Emily Filbert Other Clinician: Referring Vonne Mcdanel: Emily Filbert Treating Zoiey Christy/Extender: Ricard Dillon Weeks in Treatment: 23 Edema Assessment Assessed: [Left: No] [Right: No] Edema: [Left: N] [Right: o] Vascular Assessment Pulses: Dorsalis Pedis Palpable: [Left:Yes] Electronic Signature(s) Signed: 09/28/2019 4:56:08 PM By: Army Melia Entered By: Army Melia on 09/28/2019 13:29:28 [name], [name] ([id number]) -------------------------------------------------------------------------------- Multi Wound Chart Details Patient Name: [name] Date of Service: 09/28/2019 1:15 PM Medical Record Number: [id number] Patient Account Number: [id number] Date of Birth/Sex: [birth year] (86 y.o. F) Treating RN: Cornell Barman Primary Care Gatsby Chismar: Emily Filbert Other Clinician: Referring Aidan Caloca: Emily Filbert Treating Ed Rayson/Extender: Tito Dine in Treatment: 23 Vital Signs Height(in): 57 Pulse(bpm): 45 Weight(lbs):  195 Blood Pressure(mmHg): 146/72 Body Mass Index(BMI): 29 Temperature(F): 98.9 Respiratory Rate(breaths/min): 18 Photos: [N/A:N/A] Wound Location: Right, Dorsal Foot N/A N/A Wounding Event: Trauma N/A N/A Primary Etiology: Cellulitis N/A N/A Comorbid History: Cataracts, Arrhythmia, Congestive N/A N/A Heart Failure, Hypertension, Type II Diabetes, Rheumatoid Arthritis Date Acquired: 03/24/2019 N/A N/A Weeks of Treatment: 23 N/A N/A Wound Status: Open N/A N/A Measurements L x W x D (cm) 2.5x2.1x0.1 N/A N/A Area (  cm) : 4.123 N/A N/A Volume (cm) : 0.412 N/A N/A % Reduction in Area: 37.50% N/A N/A % Reduction in Volume: 37.60% N/A N/A Classification: Full Thickness Without Exposed N/A N/A Support Structures Exudate Amount: Medium N/A N/A Exudate Type: Serosanguineous N/A N/A Exudate Color: red, brown N/A N/A Wound Margin: Thickened N/A N/A Granulation Amount: Medium (34-66%) N/A N/A Granulation Quality: Red N/A N/A Necrotic Amount: Medium (34-66%) N/A N/A Exposed Structures: Fat Layer (Subcutaneous Tissue) N/A N/A Exposed: Yes Fascia: No Tendon: No Muscle: No Joint: No Bone: No Epithelialization: None N/A N/A Treatment Notes Wound #2 (Right, Dorsal Foot) Notes Hydrofera Blue Ready Foam, kerlix and coban toes to knee Aquadale, Maryela ([id number]) Electronic Signature(s) Signed: 09/28/2019 5:06:12 PM By: Linton Ham MD Entered By: Linton Ham on 09/28/2019 14:05:43 [name], [name] ([id number]) -------------------------------------------------------------------------------- Multi-Disciplinary Care Plan Details Patient Name: [name] Date of Service: 09/28/2019 1:15 PM Medical Record Number: [id number] Patient Account Number: [id number] Date of Birth/Sex: 04/10/1931 (86 y.o. F) Treating RN: Cornell Barman Primary Care Nhyla Nappi: Emily Filbert Other Clinician: Referring Genetta Fiero: Emily Filbert Treating Samson Ralph/Extender: Tito Dine in Treatment: 23 Active  Inactive Necrotic Tissue Nursing Diagnoses: Impaired tissue integrity related to necrotic/devitalized tissue Knowledge deficit related to management of necrotic/devitalized tissue Goals: Necrotic/devitalized tissue will be minimized in the wound bed Date Initiated: 04/20/2019 Target Resolution Date: 05/04/2019 Goal Status: Active Interventions: Assess patient pain level pre-, during and post procedure and prior to discharge Provide education on necrotic tissue and debridement process Treatment Activities: Apply topical anesthetic as ordered : 04/20/2019 Notes: Venous Leg Ulcer Nursing Diagnoses: Actual venous Insuffiency (use after diagnosis is confirmed) Goals: Patient/caregiver will verbalize understanding of disease process and disease management Date Initiated: 04/20/2019 Target Resolution Date: 05/04/2019 Goal Status: Active Interventions: Assess peripheral edema status every visit. Notes: Wound/Skin Impairment Nursing Diagnoses: Impaired tissue integrity Goals: Patient/caregiver will verbalize understanding of skin care regimen Date Initiated: 04/20/2019 Target Resolution Date: 05/04/2019 Goal Status: Active Ulcer/skin breakdown will have a volume reduction of 30% by week 4 Date Initiated: 04/20/2019 Target Resolution Date: 05/18/2019 Goal Status: Active Interventions: Assess ulceration(s) every visit Treatment Activities: Skin care regimen initiated : 04/20/2019 Topical wound management initiated : 04/20/2019 [name], [name] ([id number]) Notes: Electronic Signature(s) Signed: 09/29/2019 9:48:50 AM By: Gretta Cool, BSN, RN, CWS, Kim RN, BSN Entered By: Gretta Cool, BSN, RN, CWS, Kim on 09/28/2019 13:39:54 [name], [name] ([id number]) -------------------------------------------------------------------------------- Pain Assessment Details Patient Name: [name] Date of Service: 09/28/2019 1:15 PM Medical Record Number: [id number] Patient Account Number: [id number] Date of Birth/Sex:  1930-05-20 (86 y.o. F) Treating RN: Army Melia Primary Care Grayce Budden: Emily Filbert Other Clinician: Referring Ieisha Gao: Emily Filbert Treating Rafael Quesada/Extender: Tito Dine in Treatment: 23 Active Problems Location of Pain Severity and Description of Pain Patient Has Paino No Site Locations Pain Management and Medication Current Pain Management: Electronic Signature(s) Signed: 09/28/2019 4:56:08 PM By: Army Melia Entered By: Army Melia on 09/28/2019 13:27:47 [name], [name] ([id number]) -------------------------------------------------------------------------------- Patient/Caregiver Education Details Patient Name: [name] Date of Service: 09/28/2019 1:15 PM Medical Record Number: [id number] Patient Account Number: [id number] Date of Birth/Gender: Jun 13, 1930 (86 y.o. F) Treating RN: Cornell Barman Primary Care Physician: Emily Filbert Other Clinician: Referring Physician: Emily Filbert Treating Physician/Extender: Tito Dine in Treatment: 18 Education Assessment Education Provided To: Patient Education Topics Provided Wound/Skin Impairment: Handouts: Caring for Your Ulcer Methods: Demonstration, Explain/Verbal Responses: State content correctly Electronic Signature(s) Signed: 09/29/2019 9:48:50 AM By: Gretta Cool, BSN, RN, CWS, Kim RN, BSN Entered By: Gretta Cool, BSN, RN, CWS, Kim on 09/28/2019  13:42:02 [name], [name] ([id number]) -------------------------------------------------------------------------------- Wound Assessment Details Patient Name: [name], [name] Date of Service: 09/28/2019 1:15 PM Medical Record Number: [id number] Patient Account Number: [id number] Date of Birth/Sex: 11-28-1930 (86 y.o. F) Treating RN: Army Melia Primary Care Keldrick Pomplun: Emily Filbert Other Clinician: Referring Kaleeah Gingerich: Emily Filbert Treating Latash Nouri/Extender: Tito Dine in Treatment: 23 Wound Status Wound Number: 2 Primary Cellulitis Etiology: Wound Location:  Right, Dorsal Foot Wound Open Wounding Event: Trauma Status: Date Acquired: 03/24/2019 Comorbid Cataracts, Arrhythmia, Congestive Heart Failure, Weeks Of Treatment: 23 History: Hypertension, Type II Diabetes, Rheumatoid Arthritis Clustered Wound: No Photos Wound Measurements Length: (cm) 2.5 Width: (cm) 2.1 Depth: (cm) 0.1 Area: (cm) 4.123 Volume: (cm) 0.412 % Reduction in Area: 37.5% % Reduction in Volume: 37.6% Epithelialization: None Wound Description Classification: Full Thickness Without Exposed Support Structu Wound Margin: Thickened Exudate Amount: Medium Exudate Type: Serosanguineous Exudate Color: red, brown res Foul Odor After Cleansing: No Slough/Fibrino Yes Wound Bed Granulation Amount: Medium (34-66%) Exposed Structure Granulation Quality: Red Fascia Exposed: No Necrotic Amount: Medium (34-66%) Fat Layer (Subcutaneous Tissue) Exposed: Yes Necrotic Quality: Adherent Slough Tendon Exposed: No Muscle Exposed: No Joint Exposed: No Bone Exposed: No Treatment Notes Wound #2 (Right, Dorsal Foot) Notes Hydrofera Blue Ready Foam, kerlix and coban toes to knee Electronic Signature(s) Signed: 09/28/2019 4:56:08 PM By: Collene Gobble, Jeanae ([id number]) Entered By: Army Melia on 09/28/2019 13:28:08 Alcindor, Lamia ([id number]) -------------------------------------------------------------------------------- Vitals Details Patient Name: [name] Date of Service: 09/28/2019 1:15 PM Medical Record Number: [id number] Patient Account Number: [id number] Date of Birth/Sex: Dec 04, 1930 (86 y.o. F) Treating RN: Cornell Barman Primary Care Geovonni Meyerhoff: Emily Filbert Other Clinician: Referring Mavin Dyke: Emily Filbert Treating Kenyetta Wimbish/Extender: Tito Dine in Treatment: 23 Vital Signs Time Taken: 13:15 Temperature (F): 98.9 Height (in): 69 Pulse (bpm): 61 Weight (lbs): 195 Respiratory Rate (breaths/min): 18 Body Mass Index (BMI): 28.8 Blood Pressure  (mmHg): 146/72 Reference Range: 80 - 120 mg / dl Electronic Signature(s) Signed: 09/28/2019 4:33:32 PM By: Lorine Bears RCP, RRT, CHT Entered By: Lorine Bears on 09/28/2019 13:17:08

## 2019-09-29 NOTE — Progress Notes (Signed)
[place], [name] ([id number]) Visit Report for 09/28/2019 HPI Details Patient Name: [name], [name] [facility] Date of Service: 09/28/2019 1:15 PM Medical Record Number: [id number] Patient Account Number: [id number] Date of Birth/Sex: 02/27/1931 (86 y.o. F) Treating RN: Cornell Barman Primary Care Provider: Emily Filbert Other Clinician: Referring Provider: Emily Filbert Treating Provider/Extender: Tito Dine in Treatment: 61 History of Present Illness HPI Description: ADMISSION 04/20/2019 This is an 86 year old woman who lives in the independent part of [place]. She is a type II diabetic on oral agents and insulin with a recent hemoglobin A1c of 6.3 on 10/6. She was initially seen by primary care on 11/12 after dropping a heavy walker on her bilateral dorsal feet. She had blisters on the right and left lateral foot. She was given doxycycline. She was seen the next day in [place] clinic on 11/13 continued on the.C. Bilateral foot x-rays were negative. She was admitted to hospital from 11/17 through 11/27 with acute kidney injury I did not research this although the patient tells me that at discharge her kidney function had returned to normal. During the course of this hospitalization it was noted she had wounds on her bilateral feet and her peripheral pulses were nonpalpable. She was evaluated by Dr. Delana Meyer. It was recommended that she consider angiography on the right leg and it was unlikely that she would heal the right leg as her ABI here was 0.48 with monophasic waveforms on the left her ABI was 1.19 with biphasic waveforms. One would wonder if the ABI was artificially elevated on the left. In any case Dr. Cydney Ok note accurately describes the patient's current state of mind. She does not want to rush into an intervention and she is here for our review of her wounds. Noteworthy that when she arrived I think her renal insufficiency was felt to be prerenal. Her GFR on arrival was 22 now 36. Past  medical history includes type 2 diabetes with peripheral neuropathy, atrial fibrillation, renal insufficiency We did not calculate ABIs in our clinic this was 0.48 on the right and 1.19 on the left as noted above. Monophasic waveforms on the right biphasic on the left 12/16; patient admitted to the clinic last week with trauma critical limb ischemia on the right. Today her wound on the left dorsal foot is healed. She still has the black eschar on the dorsal right foot that we have been using Santyl on. There is apparently some drainage. She arrives in clinic today with more swelling on the right great and the left dorsal surrounding her foot wounds. Some erythema especially on the right. There is some tenderness on the right but not as much as I might expect if this was all infection nevertheless he does have some neuropathy which might be blunting discomfort. She reports no additional trauma. She is at the rehab section of [facility], she lives in the independent part of [place]. She tells me she has some pain in the right leg at night which wakes her up from sleeping although she manages to get back to sleep. She has about 150 foot exercise tolerance and then gets fatigue in her legs. Again I discussed the issue of angiogram with the patient especially on the right. She just will not agree to this. She states she wants the rehab and then go to [facility] where a list of her specialists. She was on Levaquin last week we did not prescribe this but she is finished. She describes some nausea and loose stools but does  not sound dramatically unwell with this Left dorsal foot had some very superficial areas today I think related to swelling. The big problem is on the dorsal right foot black surface eschar with increased swelling. She does not have any pain or tenderness although she has neuropathy. We have been using Santyl. She has PAD and again I think needs an angiogram. 12/30; 2-week follow-up. The areas  on the right dorsal foot. Everything is healed on the left. She is still in the rehab part of [place]. She went to see Dr. Lucky Cowboy on 12/29. He reiterated to her he felt that she needed angiography on the right leg for critical limb ischemia. She seemed to have 2 major concerns that is wanting to see a nephrologist prior to procedure in consultation. She does not currently have a nephrologist. She also had no confidence in the original arterial studies that were done in the hospital and wanted them redone. I do not think Dr. Lucky Cowboy felt that they needed to be redone and wanted to proceed with angiography. At the end of it he simply stated that he would be glad to do the angiogram at any time the patient agreed to proceed I looked briefly at her kidney function which had normalized by the time she left the hospital in late November. I do not know that the patient has any additional concern at this point other than the reduction in GFR associated with her diabetes and normal aging. 1/27; I have not seen this patient in almost 4 weeks. She was apparently isolated in the [facility] facility because of COVID-19. She tells me she was mildly symptomatic but feels better now although she still feels like she is in a "fog" as far as her mental abilities. She states that she is up and walking for short distances with a walker. She would like to go back to her independent apartment. Still using Santyl to the wound The patient is not describing a lot of pain. She does not describe claudication but her activity sounds very limited especially when she was under isolation. She is still reluctant to go forward with the angiogram that was suggested. Currently she wants to recover fully from Covid infection 2/10; 2-week follow-up. Patient arrives with 100% nonviable surface over the wound on her right dorsal foot. I changed her to [facility] from [place] last week in terms of surface the wound is deteriorated although I  think the surface area is about the same. She is not describing claudication walking about 50 feet The patient tells me she is trying to get in with her primary doctor however there is restrictions with regards to the pandemic. She wants to be referred to nephrology and then to vascular surgery at [facility] I cannot help her with this. I did tell her that her creatinine had normalized via the last lab work I could see done at [facility] in November and her estimated GFR was 53 at that time. By my estimation careful application of contrast dye and preangiogram hydration should be able to get her through the necessary angiography with no complication however she simply does not want to hear this and she is going to put this off until she can get through the [place] system 2/24; 2-week follow-up wound surface area is about the same certainly not worse. She has 100% slough cover we have been using Soyla Dryer, Clatie ([id number]) 3/3; 2-week follow-up. The patient's wound is essentially unchanged. The surface has slough on it no  debridement it certainly does not look perfused. She is worried about the erythema she is having in her foot. She had a cramp in her leg last night but is not complaining of any other type of pain. Is also is concerned about some edema that is not well corrected by the diuretic she is on 3/16; 2-week follow-up. The wound is unchanged in appearance and in size. The patient has 100% nonviable surface. She saw Dr. Bonnee Quin on 07/21/2019 vascular at [facility]. His statement was "chronic limb threatening ischemia". She was actually planned for a right leg arteriogram and intervention today however she states this was postponed and they are supposed to call her this afternoon to reschedule. Her dressing is being changed at [facility] 08/04/2019 upon evaluation today patient has had her evaluation with vascular at [facility]. Subsequently she unfortunately does not have good arterial flow into her  bilateral lower extremities. She actually is doing worse on the left as compared to the right but neither is effectively receiving good blood flow from the heart down into the lower extremity. She tells me that she actually has her dressing changed on a regular basis at [facility]. She is scheduled apparently to have intervention tomorrow in regard to her right lower extremity and then they will proceed from there in regard to the left depend on how things go. 4/14; the patient was admitted to [facility] from 3/26 through 08/07/2019. She underwent an aortogram ultimately underwent a right popliteal orbital atherectomy and angioplasty, a right anterior tibial artery orbital atherectomy and angioplasty and a right dorsalis pedis artery angioplasty. She was kept in hospital for hydration secondary to a mild bump in her creatinine. I see she was back in the vascular clinic complaining of fatigue but this was not felt to be secondary to procedure which was not done under general anesthesia. She was apparently using Medihoney, the nurse at [facility] is changing the dressing 4/21; the patient has been back to see vascular surgery at [facility] yesterday. She had an improvement in her right arterial studies which were initially done on 3/11. Although her ABI initially was 0.83 on 3/11 yesterday at 0.74 there was a fairly marked improvement in her TBI from 0.13-0.52. The patient is not complaining of any pain. We have been placing Iodoflex on the wound under kerlix Coban 4/28; patient arrived in clinic with been using Iodoflex. Still requiring debridement but the wound is measuring smaller 5/5; still using Iodoflex kerlix Coban. Dimensions slightly smaller 5/12 wound is slightly smaller and the surface looks much better. We have been using Iodoflex with kerlix Coban. The patient is not having any pain around the wound She is describing some cramping in her leg at night which seems to be mostly in her upper thigh. She  states its not every night and it does not seem severe. I am unable to pick up any claudication with activity. I looked through care everywhere. I cannot see her actual postoperative noninvasive studies but I can see them referenced in her surgeon's notes. On 4/20 her ABI was 0.74 TBI of 0.52 with biphasic waveforms on the right. On the left an ABI of 0.62 and 0.48 TBI with monophasic and biphasic waveform 5/19; the wound continues to make nice progress. Healthy granulation. Changed to [facility] Electronic Signature(s) Signed: 09/28/2019 5:06:12 PM By: Linton Ham MD Entered By: Linton Ham on 09/28/2019 14:08:27 [name], [name] ([id number]) -------------------------------------------------------------------------------- Physical Exam Details Patient Name: [name] Date of Service: 09/28/2019 1:15 PM  Medical Record Number: [id number] Patient Account Number: [id number] Date of Birth/Sex: 13-Sep-1930 (86 y.o. F) Treating RN: Cornell Barman Primary Care Provider: Emily Filbert Other Clinician: Referring Provider: Emily Filbert Treating Provider/Extender: Tito Dine in Treatment: 50 Constitutional Patient is hypertensive.. Pulse regular and within target range for patient.Marland Kitchen Respirations regular, non-labored and within target range.. Temperature is normal and within the target range for the patient.Marland Kitchen appears in no distress. Cardiovascular Both dorsalis pedis and posterior tibial pulses are palpable. Notes Wound exam; the surface area of the wound looks very healthy. No debridement was required. Surface area is smaller no surrounding infection. Electronic Signature(s) Signed: 09/28/2019 5:06:12 PM By: Linton Ham MD Entered By: Linton Ham on 09/28/2019 14:09:22 [name], [name] ([id number]) -------------------------------------------------------------------------------- Physician Orders Details Patient Name: [name] Date of Service: 09/28/2019 1:15 PM Medical Record  Number: [id number] Patient Account Number: [id number] Date of Birth/Sex: 1931/01/05 (86 y.o. F) Treating RN: Cornell Barman Primary Care Provider: Emily Filbert Other Clinician: Referring Provider: Emily Filbert Treating Provider/Extender: Tito Dine in Treatment: 51 Verbal / Phone Orders: No Diagnosis Coding Wound Cleansing Wound #2 Right,Dorsal Foot o Clean wound with Normal Saline. Anesthetic (add to Medication List) Wound #2 Right,Dorsal Foot o Topical Lidocaine 4% cream applied to wound bed prior to debridement (In Clinic Only). Skin Barriers/Peri-Wound Care Wound #2 Right,Dorsal Foot o Moisturizing lotion - on leg Primary Wound Dressing Wound #2 Right,Dorsal Foot o Hydrafera Blue Ready Transfer Secondary Dressing Wound #2 Right,Dorsal Foot o ABD pad Dressing Change Frequency Wound #2 Right,Dorsal Foot o Change Dressing Monday, Wednesday, Friday Follow-up Appointments Wound #2 Right,Dorsal Foot o Return Appointment in 2 weeks. Edema Control Wound #2 Right,Dorsal Foot o Kerlix and Coban - Right Lower Extremity o Elevate legs to the level of the heart and pump ankles as often as possible Ozan to change dressings Electronic Signature(s) Signed: 09/28/2019 5:06:12 PM By: Linton Ham MD Signed: 09/29/2019 9:48:50 AM By: Gretta Cool, BSN, RN, CWS, Kim RN, BSN Entered By: Gretta Cool, BSN, RN, CWS, Kim on 09/28/2019 13:41:16 [name], [name] ([id number]) -------------------------------------------------------------------------------- Problem List Details Patient Name: [name] Date of Service: 09/28/2019 1:15 PM Medical Record Number: [id number] Patient Account Number: [id number] Date of Birth/Sex: 1930-09-13 (86 y.o. F) Treating RN: Cornell Barman Primary Care Provider: Emily Filbert Other Clinician: Referring Provider: Emily Filbert Treating Provider/Extender: Tito Dine in Treatment: 23 Active  Problems ICD-10 Encounter Code Description Active Date MDM Diagnosis E11.52 Type 2 diabetes mellitus with diabetic peripheral angiopathy with 04/20/2019 No Yes gangrene L97.518 Non-pressure chronic ulcer of other part of right foot with other specified 04/20/2019 No Yes severity E11.42 Type 2 diabetes mellitus with diabetic polyneuropathy 04/20/2019 No Yes E11.621 Type 2 diabetes mellitus with foot ulcer 04/20/2019 No Yes Inactive Problems ICD-10 Code Description Active Date Inactive Date L97.528 Non-pressure chronic ulcer of other part of left foot with other specified 04/20/2019 04/20/2019 severity Resolved Problems Electronic Signature(s) Signed: 09/28/2019 5:06:12 PM By: Linton Ham MD Entered By: Linton Ham on 09/28/2019 14:05:35 Verdig[place], Merrill ([id number]) -------------------------------------------------------------------------------- Progress Note Details Patient Name: [name] Date of Service: 09/28/2019 1:15 PM Medical Record Number: [id number] Patient Account Number: [id number] Date of Birth/Sex: 1930-09-24 (86 y.o. F) Treating RN: Cornell Barman Primary Care Provider: Emily Filbert Other Clinician: Referring Provider: Emily Filbert Treating Provider/Extender: Tito Dine in Treatment: 23 Subjective History of Present Illness (HPI) ADMISSION 04/20/2019 This is an 86 year old woman who lives in the independent part of [place]. She  is a type II diabetic on oral agents and insulin with a recent hemoglobin A1c of 6.3 on 10/6. She was initially seen by primary care on 11/12 after dropping a heavy walker on her bilateral dorsal feet. She had blisters on the right and left lateral foot. She was given doxycycline. She was seen the next day in [place] clinic on 11/13 continued on the.C. Bilateral foot x-rays were negative. She was admitted to hospital from 11/17 through 11/27 with acute kidney injury I did not research this although the patient tells me that  at discharge her kidney function had returned to normal. During the course of this hospitalization it was noted she had wounds on her bilateral feet and her peripheral pulses were nonpalpable. She was evaluated by Dr. Delana Meyer. It was recommended that she consider angiography on the right leg and it was unlikely that she would heal the right leg as her ABI here was 0.48 with monophasic waveforms on the left her ABI was 1.19 with biphasic waveforms. One would wonder if the ABI was artificially elevated on the left. In any case Dr. Cydney Ok note accurately describes the patient's current state of mind. She does not want to rush into an intervention and she is here for our review of her wounds. Noteworthy that when she arrived I think her renal insufficiency was felt to be prerenal. Her GFR on arrival was 22 now 36. Past medical history includes type 2 diabetes with peripheral neuropathy, atrial fibrillation, renal insufficiency We did not calculate ABIs in our clinic this was 0.48 on the right and 1.19 on the left as noted above. Monophasic waveforms on the right biphasic on the left 12/16; patient admitted to the clinic last week with trauma critical limb ischemia on the right. Today her wound on the left dorsal foot is healed. She still has the black eschar on the dorsal right foot that we have been using Santyl on. There is apparently some drainage. She arrives in clinic today with more swelling on the right great and the left dorsal surrounding her foot wounds. Some erythema especially on the right. There is some tenderness on the right but not as much as I might expect if this was all infection nevertheless he does have some neuropathy which might be blunting discomfort. She reports no additional trauma. She is at the rehab section of [facility], she lives in the independent part of [place]. She tells me she has some pain in the right leg at night which wakes her up from sleeping although she  manages to get back to sleep. She has about 150 foot exercise tolerance and then gets fatigue in her legs. Again I discussed the issue of angiogram with the patient especially on the right. She just will not agree to this. She states she wants the rehab and then go to [facility] where a list of her specialists. She was on Levaquin last week we did not prescribe this but she is finished. She describes some nausea and loose stools but does not sound dramatically unwell with this Left dorsal foot had some very superficial areas today I think related to swelling. The big problem is on the dorsal right foot black surface eschar with increased swelling. She does not have any pain or tenderness although she has neuropathy. We have been using Santyl. She has PAD and again I think needs an angiogram. 12/30; 2-week follow-up. The areas on the right dorsal foot. Everything is healed on the left. She is  still in the rehab part of [place]. She went to see Dr. Lucky Cowboy on 12/29. He reiterated to her he felt that she needed angiography on the right leg for critical limb ischemia. She seemed to have 2 major concerns that is wanting to see a nephrologist prior to procedure in consultation. She does not currently have a nephrologist. She also had no confidence in the original arterial studies that were done in the hospital and wanted them redone. I do not think Dr. Lucky Cowboy felt that they needed to be redone and wanted to proceed with angiography. At the end of it he simply stated that he would be glad to do the angiogram at any time the patient agreed to proceed I looked briefly at her kidney function which had normalized by the time she left the hospital in late November. I do not know that the patient has any additional concern at this point other than the reduction in GFR associated with her diabetes and normal aging. 1/27; I have not seen this patient in almost 4 weeks. She was apparently isolated in the [facility] facility  because of COVID-19. She tells me she was mildly symptomatic but feels better now although she still feels like she is in a "fog" as far as her mental abilities. She states that she is up and walking for short distances with a walker. She would like to go back to her independent apartment. Still using Santyl to the wound The patient is not describing a lot of pain. She does not describe claudication but her activity sounds very limited especially when she was under isolation. She is still reluctant to go forward with the angiogram that was suggested. Currently she wants to recover fully from Covid infection 2/10; 2-week follow-up. Patient arrives with 100% nonviable surface over the wound on her right dorsal foot. I changed her to [facility] from [place] last week in terms of surface the wound is deteriorated although I think the surface area is about the same. She is not describing claudication walking about 50 feet The patient tells me she is trying to get in with her primary doctor however there is restrictions with regards to the pandemic. She wants to be referred to nephrology and then to vascular surgery at [facility] I cannot help her with this. I did tell her that her creatinine had normalized via the last lab work I could see done at [facility] in November and her estimated GFR was 53 at that time. By my estimation careful application of contrast dye and preangiogram hydration should be able to get her through the necessary angiography with no complication however she simply does not want to hear this and she is going to put this off until she can get through the [place] system 2/24; 2-week follow-up wound surface area is about the same certainly not worse. She has 100% slough cover we have been using Santyl 3/3; 2-week follow-up. The patient's wound is essentially unchanged. The surface has slough on it no debridement it certainly does not look perfused. She is worried about the erythema she is having  in her foot. She had a cramp in her leg last night but is not complaining of any other type of pain. Is also is concerned about some edema that is not well corrected by the diuretic she is on 3/16; 2-week follow-up. The wound is unchanged in appearance and in size. The patient has 100% nonviable surface. She saw Dr. Reginal Lutes, [place] ([id number]) Gari Crown on  07/21/2019 vascular at [facility]. His statement was "chronic limb threatening ischemia". She was actually planned for a right leg arteriogram and intervention today however she states this was postponed and they are supposed to call her this afternoon to reschedule. Her dressing is being changed at [facility] 08/04/2019 upon evaluation today patient has had her evaluation with vascular at [facility]. Subsequently she unfortunately does not have good arterial flow into her bilateral lower extremities. She actually is doing worse on the left as compared to the right but neither is effectively receiving good blood flow from the heart down into the lower extremity. She tells me that she actually has her dressing changed on a regular basis at [facility]. She is scheduled apparently to have intervention tomorrow in regard to her right lower extremity and then they will proceed from there in regard to the left depend on how things go. 4/14; the patient was admitted to [facility] from 3/26 through 08/07/2019. She underwent an aortogram ultimately underwent a right popliteal orbital atherectomy and angioplasty, a right anterior tibial artery orbital atherectomy and angioplasty and a right dorsalis pedis artery angioplasty. She was kept in hospital for hydration secondary to a mild bump in her creatinine. I see she was back in the vascular clinic complaining of fatigue but this was not felt to be secondary to procedure which was not done under general anesthesia. She was apparently using Medihoney, the nurse at [facility] is changing the dressing 4/21; the patient has been  back to see vascular surgery at [facility] yesterday. She had an improvement in her right arterial studies which were initially done on 3/11. Although her ABI initially was 0.83 on 3/11 yesterday at 0.74 there was a fairly marked improvement in her TBI from 0.13-0.52. The patient is not complaining of any pain. We have been placing Iodoflex on the wound under kerlix Coban 4/28; patient arrived in clinic with been using Iodoflex. Still requiring debridement but the wound is measuring smaller 5/5; still using Iodoflex kerlix Coban. Dimensions slightly smaller 5/12 wound is slightly smaller and the surface looks much better. We have been using Iodoflex with kerlix Coban. The patient is not having any pain around the wound She is describing some cramping in her leg at night which seems to be mostly in her upper thigh. She states its not every night and it does not seem severe. I am unable to pick up any claudication with activity. I looked through care everywhere. I cannot see her actual postoperative noninvasive studies but I can see them referenced in her surgeon's notes. On 4/20 her ABI was 0.74 TBI of 0.52 with biphasic waveforms on the right. On the left an ABI of 0.62 and 0.48 TBI with monophasic and biphasic waveform 5/19; the wound continues to make nice progress. Healthy granulation. Changed to Hydrofera Blue Objective Constitutional Patient is hypertensive.. Pulse regular and within target range for patient.Marland Kitchen Respirations regular, non-labored and within target range.. Temperature is normal and within the target range for the patient.Marland Kitchen appears in no distress. Vitals Time Taken: 1:15 PM, Height: 69 in, Weight: 195 lbs, BMI: 28.8, Temperature: 98.9 F, Pulse: 61 bpm, Respiratory Rate: 18 breaths/min, Blood Pressure: 146/72 mmHg. Cardiovascular Both dorsalis pedis and posterior tibial pulses are palpable. General Notes: Wound exam; the surface area of the wound looks very healthy. No debridement was  required. Surface area is smaller no surrounding infection. Integumentary (Hair, Skin) Wound #2 status is Open. Original cause of wound was Trauma. The wound is located  on the Right,Dorsal Foot. The wound measures 2.5cm length x 2.1cm width x 0.1cm depth; 4.123cm^2 area and 0.412cm^3 volume. There is Fat Layer (Subcutaneous Tissue) Exposed exposed. There is a medium amount of serosanguineous drainage noted. The wound margin is thickened. There is medium (34-66%) red granulation within the wound bed. There is a medium (34-66%) amount of necrotic tissue within the wound bed including Adherent Slough. 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 Anesthetic (add to Medication List): Wound #2 Right,Dorsal Foot: Topical Lidocaine 4% cream applied to wound bed prior to debridement (In Clinic Only). Skin Barriers/Peri-Wound Care: Wound #2 Right,Dorsal Foot: Moisturizing lotion - on leg Primary Wound Dressing: Wound #2 Right,Dorsal Foot: Hydrafera Blue Ready Transfer Secondary Dressing: Wound #2 Right,Dorsal Foot: ABD pad Dressing Change Frequency: Wound #2 Right,Dorsal Foot: Change Dressing Monday, Wednesday, Friday Follow-up Appointments: Wound #2 Right,Dorsal Foot: Return Appointment in 2 weeks. Edema Control: Wound #2 Right,Dorsal Foot: Kerlix and Coban - Right Lower Extremity Elevate legs to the level of the heart and pump ankles as often as possible Home Health: [place] to change dressings 1. Right dorsal foot. Nice improvement in surface area and condition of the wound surface. 2. Initially  ischemic wound. He has been revascularized at [facility]. Dorsalis pedis and posterior tibial pulses remain palpable. 3. She is having the dressing changed by the nurses at [facility]) Signed: 09/28/2019 5:06:12 PM By: Linton Ham MD Entered By: Linton Ham on 09/28/2019 14:10:19 [name], [name] ([id number]) -------------------------------------------------------------------------------- SuperBill Details Patient Name: [name] Date of Service: 09/28/2019 Medical Record Number: [id number] Patient Account Number: [id number] Date of Birth/Sex: 07-May-1931 (86 y.o. F) Treating RN: Cornell Barman Primary Care Provider: Emily Filbert Other Clinician: Referring Provider: Emily Filbert Treating Provider/Extender: Tito Dine in Treatment: 23 Diagnosis Coding ICD-10 Codes Code Description E11.52 Type 2 diabetes mellitus with diabetic peripheral angiopathy with gangrene L97.518 Non-pressure chronic ulcer of other part of right foot with other specified severity E11.42 Type 2 diabetes mellitus with diabetic polyneuropathy E11.621 Type 2 diabetes mellitus with foot ulcer Facility Procedures CPT4 Code: [id number] Description: [id number] - WOUND CARE VISIT-LEV 3 EST PT Modifier: Quantity: 1 Physician Procedures CPT4 Code: [id number] Description: [id number] - WC PHYS LEVEL 3 - EST PT Modifier: Quantity: 1 CPT4 Code: Description: ICD-10 Diagnosis Description E11.52 Type 2 diabetes mellitus with diabetic peripheral angiopathy with gangre L97.518 Non-pressure chronic ulcer of other part of right foot with other specif E11.621 Type 2 diabetes mellitus with foot ulcer Modifier: ne ied severity Quantity: Electronic Signature(s) Signed: 09/28/2019 5:06:12 PM By: Linton Ham MD Entered By: Linton Ham on 09/28/2019 14:10:48

## 2019-10-12 ENCOUNTER — Other Ambulatory Visit: Payer: Self-pay

## 2019-10-12 ENCOUNTER — Encounter: Payer: Medicare Other | Attending: Internal Medicine | Admitting: Internal Medicine

## 2019-10-12 DIAGNOSIS — M069 Rheumatoid arthritis, unspecified: Secondary | ICD-10-CM | POA: Diagnosis not present

## 2019-10-12 DIAGNOSIS — E11621 Type 2 diabetes mellitus with foot ulcer: Secondary | ICD-10-CM | POA: Insufficient documentation

## 2019-10-12 DIAGNOSIS — Z8616 Personal history of COVID-19: Secondary | ICD-10-CM | POA: Insufficient documentation

## 2019-10-12 DIAGNOSIS — E1142 Type 2 diabetes mellitus with diabetic polyneuropathy: Secondary | ICD-10-CM | POA: Insufficient documentation

## 2019-10-12 DIAGNOSIS — I110 Hypertensive heart disease with heart failure: Secondary | ICD-10-CM | POA: Diagnosis not present

## 2019-10-12 DIAGNOSIS — E1152 Type 2 diabetes mellitus with diabetic peripheral angiopathy with gangrene: Secondary | ICD-10-CM | POA: Diagnosis not present

## 2019-10-12 DIAGNOSIS — I4891 Unspecified atrial fibrillation: Secondary | ICD-10-CM | POA: Insufficient documentation

## 2019-10-12 DIAGNOSIS — I509 Heart failure, unspecified: Secondary | ICD-10-CM | POA: Diagnosis not present

## 2019-10-12 DIAGNOSIS — Z794 Long term (current) use of insulin: Secondary | ICD-10-CM | POA: Insufficient documentation

## 2019-10-12 DIAGNOSIS — L97518 Non-pressure chronic ulcer of other part of right foot with other specified severity: Secondary | ICD-10-CM | POA: Insufficient documentation

## 2019-10-13 NOTE — Progress Notes (Signed)
[place], [place] ([id number]) Visit Report for 10/12/2019 Arrival Information Details Patient Name: [name], [name] [facility] Date of Service: 10/12/2019 10:45 AM Medical Record Number: [id number] Patient Account Number: [id number] Date of Birth/Sex: [birth year] (86 y.o. F) Treating RN: Montey Hora Primary Care Makenzi Bannister: Emily Filbert Other Clinician: Referring Hollye Pritt: Emily Filbert Treating Sania Noy/Extender: Tito Dine in Treatment: 25 Visit Information History Since Last Visit Added or deleted any medications: No Patient Arrived: [place] Any new allergies or adverse reactions: No Arrival Time: 10:49 Had a fall or experienced change in No Accompanied By: self activities of daily living that may affect Transfer Assistance: None risk of falls: Patient Identification Verified: Yes Signs or symptoms of abuse/neglect since last visito No Secondary Verification Process Completed: Yes Hospitalized since last visit: No Patient Has Alerts: Yes Implantable device outside of the clinic excluding No Patient Alerts: Patient on Blood Thinner cellular tissue based products placed in the center ABI L 1.19 R 0.48 03/2019 since last visit: 2-325mg  Aspirin daily Has Dressing in Place as Prescribed: Yes TBI R .13 L .42 Has Compression in Place as Prescribed: No Pain Present Now: No Electronic Signature(s) Signed: 10/12/2019 4:33:52 PM By: Montey Hora Entered By: Montey Hora on 10/12/2019 10:50:12 [name], [name] ([id number]) -------------------------------------------------------------------------------- Clinic Level of Care Assessment Details Patient Name: [name] Date of Service: 10/12/2019 10:45 AM Medical Record Number: [id number] Patient Account Number: [id number] Date of Birth/Sex: 04-06-1931 (86 y.o. F) Treating RN: Cornell Barman Primary Care Davionne Mastrangelo: Emily Filbert Other Clinician: Referring Alfred Harrel: Emily Filbert Treating Estefano Victory/Extender: Tito Dine in Treatment:  25 Clinic Level of Care Assessment Items TOOL 4 Quantity Score []  - Use when only an EandM is performed on FOLLOW-UP visit 0 ASSESSMENTS - Nursing Assessment / Reassessment X - Reassessment of Co-morbidities (includes updates in patient status) 1 10 X- 1 5 Reassessment of Adherence to Treatment Plan ASSESSMENTS - Wound and Skin Assessment / Reassessment X - Simple Wound Assessment / Reassessment - one wound 1 5 []  - 0 Complex Wound Assessment / Reassessment - multiple wounds []  - 0 Dermatologic / Skin Assessment (not related to wound area) ASSESSMENTS - Focused Assessment []  - Circumferential Edema Measurements - multi extremities 0 []  - 0 Nutritional Assessment / Counseling / Intervention []  - 0 Lower Extremity Assessment (monofilament, tuning fork, pulses) []  - 0 Peripheral Arterial Disease Assessment (using hand held doppler) ASSESSMENTS - Ostomy and/or Continence Assessment and Care []  - Incontinence Assessment and Management 0 []  - 0 Ostomy Care Assessment and Management (repouching, etc.) PROCESS - Coordination of Care X - Simple Patient / Family Education for ongoing care 1 15 []  - 0 Complex (extensive) Patient / Family Education for ongoing care []  - 0 Staff obtains Programmer, systems, Records, Test Results / Process Orders []  - 0 Staff telephones HHA, Nursing Homes / Clarify orders / etc []  - 0 Routine Transfer to another Facility (non-emergent condition) []  - 0 Routine Hospital Admission (non-emergent condition) []  - 0 New Admissions / Biomedical engineer / Ordering NPWT, Apligraf, etc. []  - 0 Emergency Hospital Admission (emergent condition) X- 1 10 Simple Discharge Coordination []  - 0 Complex (extensive) Discharge Coordination PROCESS - Special Needs []  - Pediatric / Minor Patient Management 0 []  - 0 Isolation Patient Management []  - 0 Hearing / Language / Visual special needs []  - 0 Assessment of Community assistance (transportation, D/C planning,  etc.) []  - 0 Additional assistance / Altered mentation []  - 0 Support Surface(s) Assessment (bed, cushion, seat, etc.) INTERVENTIONS - Wound Cleansing / Measurement [name],  [name] ([id number]) X- 1 5 Simple Wound Cleansing - one wound []  - 0 Complex Wound Cleansing - multiple wounds X- 1 5 Wound Imaging (photographs - any number of wounds) []  - 0 Wound Tracing (instead of photographs) X- 1 5 Simple Wound Measurement - one wound []  - 0 Complex Wound Measurement - multiple wounds INTERVENTIONS - Wound Dressings X - Small Wound Dressing one or multiple wounds 1 10 []  - 0 Medium Wound Dressing one or multiple wounds []  - 0 Large Wound Dressing one or multiple wounds []  - 0 Application of Medications - topical []  - 0 Application of Medications - injection INTERVENTIONS - Miscellaneous []  - External ear exam 0 []  - 0 Specimen Collection (cultures, biopsies, blood, body fluids, etc.) []  - 0 Specimen(s) / Culture(s) sent or taken to Lab for analysis []  - 0 Patient Transfer (multiple staff / Civil Service fast streamer / Similar devices) []  - 0 Simple Staple / Suture removal (25 or less) []  - 0 Complex Staple / Suture removal (26 or more) []  - 0 Hypo / Hyperglycemic Management (close monitor of Blood Glucose) []  - 0 Ankle / Brachial Index (ABI) - do not check if billed separately X- 1 5 Vital Signs Has the patient been seen at the hospital within the last three years: Yes Total Score: 75 Level Of Care: New/Established - Level 2 Electronic Signature(s) Signed: 10/13/2019 1:40:27 PM By: Gretta Cool, BSN, RN, CWS, Kim RN, BSN Entered By: Gretta Cool, BSN, RN, CWS, Kim on 10/12/2019 11:18:11 [name], [name] ([id number]) -------------------------------------------------------------------------------- Encounter Discharge Information Details Patient Name: [name] Date of Service: 10/12/2019 10:45 AM Medical Record Number: [id number] Patient Account Number: [id number] Date of Birth/Sex: [birth year] (86 y.o.  F) Treating RN: Cornell Barman Primary Care Tynesia Harral: Emily Filbert Other Clinician: Referring Issiac Jamar: Emily Filbert Treating Brynn Mulgrew/Extender: Tito Dine in Treatment: 25 Encounter Discharge Information Items Discharge Condition: Stable Ambulatory Status: Ambulatory Discharge Destination: Home Transportation: Private Auto Accompanied By: self Schedule Follow-up Appointment: Yes Clinical Summary of Care: Electronic Signature(s) Signed: 10/13/2019 1:40:27 PM By: Gretta Cool, BSN, RN, CWS, Kim RN, BSN Entered By: Gretta Cool, BSN, RN, CWS, Kim on 10/12/2019 11:19:01 [name], Ludwika ([id number]) -------------------------------------------------------------------------------- Lower Extremity Assessment Details Patient Name: [name] Date of Service: 10/12/2019 10:45 AM Medical Record Number: [id number] Patient Account Number: [id number] Date of Birth/Sex: 1930/07/29 (86 y.o. F) Treating RN: Montey Hora Primary Care Zilah Villaflor: Emily Filbert Other Clinician: Referring Chisom Aust: Emily Filbert Treating Anvika Gashi/Extender: Ricard Dillon Weeks in Treatment: 25 Edema Assessment Assessed: [Left: No] [Right: No] Edema: [Left: Ye] [Right: s] Vascular Assessment Pulses: Dorsalis Pedis Palpable: [Right:Yes] Electronic Signature(s) Signed: 10/12/2019 4:33:52 PM By: Montey Hora Entered By: Montey Hora on 10/12/2019 10:57:23 Hartog, Yanet ([id number]) -------------------------------------------------------------------------------- Multi Wound Chart Details Patient Name: [name] Date of Service: 10/12/2019 10:45 AM Medical Record Number: [id number] Patient Account Number: [id number] Date of Birth/Sex: Oct 01, 1930 (86 y.o. F) Treating RN: Cornell Barman Primary Care Travarius Lange: Emily Filbert Other Clinician: Referring Coalton Arch: Emily Filbert Treating Masiah Woody/Extender: Tito Dine in Treatment: 25 Vital Signs Height(in): 69 Pulse(bpm): 24 Weight(lbs): 195 Blood Pressure(mmHg):  120/76 Body Mass Index(BMI): 29 Temperature(F): 98.2 Respiratory Rate(breaths/min): 16 Photos: [N/A:N/A] Wound Location: Right, Dorsal Foot N/A N/A Wounding Event: Trauma N/A N/A Primary Etiology: Cellulitis N/A N/A Comorbid History: Cataracts, Arrhythmia, Congestive N/A N/A Heart Failure, Hypertension, Type II Diabetes, Rheumatoid Arthritis Date Acquired: 03/24/2019 N/A N/A Weeks of Treatment: 25 N/A N/A Wound Status: Open N/A N/A Measurements L x W x D (cm) 1.6x1.3x0.1 N/A N/A Area (cm) : 1.634 N/A  N/A Volume (cm) : 0.163 N/A N/A % Reduction in Area: 75.20% N/A N/A % Reduction in Volume: 75.30% N/A N/A Classification: Full Thickness Without Exposed N/A N/A Support Structures Exudate Amount: Medium N/A N/A Exudate Type: Serosanguineous N/A N/A Exudate Color: red, brown N/A N/A Wound Margin: Thickened N/A N/A Granulation Amount: Large (67-100%) N/A N/A Granulation Quality: Red N/A N/A Necrotic Amount: Small (1-33%) N/A N/A Exposed Structures: Fat Layer (Subcutaneous Tissue) N/A N/A Exposed: Yes Fascia: No Tendon: No Muscle: No Joint: No Bone: No Epithelialization: None N/A N/A Treatment Notes Wound #2 (Right, Dorsal Foot) Notes Hydrofera Blue Ready Foam, kerlix and coban toes to knee [name], [name] ([id number]) Electronic Signature(s) Signed: 10/12/2019 4:41:32 PM By: Linton Ham MD Entered By: Linton Ham on 10/12/2019 11:40:42 [name], [name] ([id number]) -------------------------------------------------------------------------------- Multi-Disciplinary Care Plan Details Patient Name: [name] Date of Service: 10/12/2019 10:45 AM Medical Record Number: [id number] Patient Account Number: [id number] Date of Birth/Sex: 1931/01/18 (86 y.o. F) Treating RN: Cornell Barman Primary Care Rosangela Fehrenbach: Emily Filbert Other Clinician: Referring Eloni Darius: Emily Filbert Treating Rai Sinagra/Extender: Tito Dine in Treatment: 25 Active Inactive Necrotic Tissue Nursing  Diagnoses: Impaired tissue integrity related to necrotic/devitalized tissue Knowledge deficit related to management of necrotic/devitalized tissue Goals: Necrotic/devitalized tissue will be minimized in the wound bed Date Initiated: 04/20/2019 Target Resolution Date: 05/04/2019 Goal Status: Active Interventions: Assess patient pain level pre-, during and post procedure and prior to discharge Provide education on necrotic tissue and debridement process Treatment Activities: Apply topical anesthetic as ordered : 04/20/2019 Notes: Venous Leg Ulcer Nursing Diagnoses: Actual venous Insuffiency (use after diagnosis is confirmed) Goals: Patient/caregiver will verbalize understanding of disease process and disease management Date Initiated: 04/20/2019 Target Resolution Date: 05/04/2019 Goal Status: Active Interventions: Assess peripheral edema status every visit. Notes: Wound/Skin Impairment Nursing Diagnoses: Impaired tissue integrity Goals: Patient/caregiver will verbalize understanding of skin care regimen Date Initiated: 04/20/2019 Target Resolution Date: 05/04/2019 Goal Status: Active Ulcer/skin breakdown will have a volume reduction of 30% by week 4 Date Initiated: 04/20/2019 Target Resolution Date: 05/18/2019 Goal Status: Active Interventions: Assess ulceration(s) every visit Treatment Activities: Skin care regimen initiated : 04/20/2019 Topical wound management initiated : 04/20/2019 [name], [name] ([id number]) Notes: Electronic Signature(s) Signed: 10/13/2019 1:40:27 PM By: Gretta Cool, BSN, RN, CWS, Kim RN, BSN Entered By: Gretta Cool, BSN, RN, CWS, Kim on 10/12/2019 11:16:01 [name], [name] -------------------------------------------------------------------------------- Pain Assessment Details Patient Name: [name] Date of Service: 10/12/2019 10:45 AM Medical Record Number: [id number] Patient Account Number: [id number] Date of Birth/Sex: 1930/09/29 (86 y.o. F) Treating RN:  Montey Hora Primary Care Marlette Curvin: Emily Filbert Other Clinician: Referring Maizie Garno: Emily Filbert Treating Sabriel Borromeo/Extender: Tito Dine in Treatment: 25 Active Problems Location of Pain Severity and Description of Pain Patient Has Paino No Site Locations Pain Management and Medication Current Pain Management: Electronic Signature(s) Signed: 10/12/2019 4:33:52 PM By: Montey Hora Entered By: Montey Hora on 10/12/2019 10:50:27 [name], [name] ([id number]) -------------------------------------------------------------------------------- Patient/Caregiver Education Details Patient Name: [name] Date of Service: 10/12/2019 10:45 AM Medical Record Number: [id number] Patient Account Number: [id number] Date of Birth/Gender: 06-12-1930 (86 y.o. F) Treating RN: Cornell Barman Primary Care Physician: Emily Filbert Other Clinician: Referring Physician: Emily Filbert Treating Physician/Extender: Tito Dine in Treatment: 25 Education Assessment Education Provided To: Patient Education Topics Provided Wound/Skin Impairment: Handouts: Caring for Your Ulcer Methods: Demonstration, Explain/Verbal Responses: State content correctly Electronic Signature(s) Signed: 10/13/2019 1:40:27 PM By: Gretta Cool, BSN, RN, CWS, Kim RN, BSN Entered By: Gretta Cool, BSN, RN, CWS, Kim on 10/12/2019 11:18:28 [name], [name] ([id number]) --------------------------------------------------------------------------------  Wound Assessment Details Patient Name: [name], [name] Date of Service: 10/12/2019 10:45 AM Medical Record Number: [id number] Patient Account Number: [id number] Date of Birth/Sex: [birth year] (86 y.o. F) Treating RN: Montey Hora Primary Care Samaa Ueda: Emily Filbert Other Clinician: Referring Jet Armbrust: Emily Filbert Treating Ollin Hochmuth/Extender: Tito Dine in Treatment: 25 Wound Status Wound Number: 2 Primary Cellulitis Etiology: Wound Location: Right, Dorsal Foot Wound  Open Wounding Event: Trauma Status: Date Acquired: 03/24/2019 Comorbid Cataracts, Arrhythmia, Congestive Heart Failure, Weeks Of Treatment: 25 History: Hypertension, Type II Diabetes, Rheumatoid Arthritis Clustered Wound: No Photos Wound Measurements Length: (cm) 1.6 Width: (cm) 1.3 Depth: (cm) 0.1 Area: (cm) 1.634 Volume: (cm) 0.163 % Reduction in Area: 75.2% % Reduction in Volume: 75.3% Epithelialization: None Tunneling: No Undermining: No Wound Description Classification: Full Thickness Without Exposed Support Structu Wound Margin: Thickened Exudate Amount: Medium Exudate Type: Serosanguineous Exudate Color: red, brown res Foul Odor After Cleansing: No Slough/Fibrino Yes Wound Bed Granulation Amount: Large (67-100%) Exposed Structure Granulation Quality: Red Fascia Exposed: No Necrotic Amount: Small (1-33%) Fat Layer (Subcutaneous Tissue) Exposed: Yes Necrotic Quality: Adherent Slough Tendon Exposed: No Muscle Exposed: No Joint Exposed: No Bone Exposed: No Treatment Notes Wound #2 (Right, Dorsal Foot) Notes Hydrofera Blue Ready Foam, kerlix and coban toes to knee Electronic Signature(s) Signed: 10/12/2019 4:33:52 PM By: [name], [name] ([id number]) Entered By: Montey Hora on 10/12/2019 10:56:49 Spagnuolo, Caliyah ([id number]) -------------------------------------------------------------------------------- Vitals Details Patient Name: [name] Date of Service: 10/12/2019 10:45 AM Medical Record Number: [id number] Patient Account Number: [id number] Date of Birth/Sex: 11/30/1930 (86 y.o. F) Treating RN: Montey Hora Primary Care Karington Zarazua: Emily Filbert Other Clinician: Referring Keagan Brislin: Emily Filbert Treating Pasqualino Witherspoon/Extender: Tito Dine in Treatment: 25 Vital Signs Time Taken: 10:50 Temperature (F): 98.2 Height (in): 69 Pulse (bpm): 58 Weight (lbs): 195 Respiratory Rate (breaths/min): 16 Body Mass Index (BMI): 28.8 Blood  Pressure (mmHg): 120/76 Reference Range: 80 - 120 mg / dl Electronic Signature(s) Signed: 10/12/2019 4:33:52 PM By: Montey Hora Entered By: Montey Hora on 10/12/2019 10:53:18

## 2019-10-13 NOTE — Progress Notes (Signed)
[place], Erline ([id number]) Visit Report for 10/12/2019 HPI Details Patient Name: [name], [name] [facility] Date of Service: 10/12/2019 10:45 AM Medical Record Number: [id number] Patient Account Number: [id number] Date of Birth/Sex: [birth year] (86 y.o. F) Treating RN: Cornell Barman Primary Care Provider: Emily Filbert Other Clinician: Referring Provider: Emily Filbert Treating Provider/Extender: Tito Dine in Treatment: 25 History of Present Illness HPI Description: ADMISSION 04/20/2019 This is an 86 year old woman who lives in the independent part of [place]. She is a type II diabetic on oral agents and insulin with a recent hemoglobin A1c of 6.3 on 10/6. She was initially seen by primary care on 11/12 after dropping a heavy walker on her bilateral dorsal feet. She had blisters on the right and left lateral foot. She was given doxycycline. She was seen the next day in [place] clinic on 11/13 continued on the.C. Bilateral foot x-rays were negative. She was admitted to hospital from 11/17 through 11/27 with acute kidney injury I did not research this although the patient tells me that at discharge her kidney function had returned to normal. During the course of this hospitalization it was noted she had wounds on her bilateral feet and her peripheral pulses were nonpalpable. She was evaluated by Dr. Delana Meyer. It was recommended that she consider angiography on the right leg and it was unlikely that she would heal the right leg as her ABI here was 0.48 with monophasic waveforms on the left her ABI was 1.19 with biphasic waveforms. One would wonder if the ABI was artificially elevated on the left. In any case Dr. Cydney Ok note accurately describes the patient's current state of mind. She does not want to rush into an intervention and she is here for our review of her wounds. Noteworthy that when she arrived I think her renal insufficiency was felt to be prerenal. Her GFR on arrival was 22 now 36. Past  medical history includes type 2 diabetes with peripheral neuropathy, atrial fibrillation, renal insufficiency We did not calculate ABIs in our clinic this was 0.48 on the right and 1.19 on the left as noted above. Monophasic waveforms on the right biphasic on the left 12/16; patient admitted to the clinic last week with trauma critical limb ischemia on the right. Today her wound on the left dorsal foot is healed. She still has the black eschar on the dorsal right foot that we have been using Santyl on. There is apparently some drainage. She arrives in clinic today with more swelling on the right great and the left dorsal surrounding her foot wounds. Some erythema especially on the right. There is some tenderness on the right but not as much as I might expect if this was all infection nevertheless he does have some neuropathy which might be blunting discomfort. She reports no additional trauma. She is at the rehab section of [facility], she lives in the independent part of [place]. She tells me she has some pain in the right leg at night which wakes her up from sleeping although she manages to get back to sleep. She has about 150 foot exercise tolerance and then gets fatigue in her legs. Again I discussed the issue of angiogram with the patient especially on the right. She just will not agree to this. She states she wants the rehab and then go to [facility] where a list of her specialists. She was on Levaquin last week we did not prescribe this but she is finished. She describes some nausea and loose stools but does  not sound dramatically unwell with this Left dorsal foot had some very superficial areas today I think related to swelling. The big problem is on the dorsal right foot black surface eschar with increased swelling. She does not have any pain or tenderness although she has neuropathy. We have been using Santyl. She has PAD and again I think needs an angiogram. 12/30; 2-week follow-up. The areas  on the right dorsal foot. Everything is healed on the left. She is still in the rehab part of [place]. She went to see Dr. Lucky Cowboy on 12/29. He reiterated to her he felt that she needed angiography on the right leg for critical limb ischemia. She seemed to have 2 major concerns that is wanting to see a nephrologist prior to procedure in consultation. She does not currently have a nephrologist. She also had no confidence in the original arterial studies that were done in the hospital and wanted them redone. I do not think Dr. Lucky Cowboy felt that they needed to be redone and wanted to proceed with angiography. At the end of it he simply stated that he would be glad to do the angiogram at any time the patient agreed to proceed I looked briefly at her kidney function which had normalized by the time she left the hospital in late November. I do not know that the patient has any additional concern at this point other than the reduction in GFR associated with her diabetes and normal aging. 1/27; I have not seen this patient in almost 4 weeks. She was apparently isolated in the [facility] facility because of COVID-19. She tells me she was mildly symptomatic but feels better now although she still feels like she is in a "fog" as far as her mental abilities. She states that she is up and walking for short distances with a walker. She would like to go back to her independent apartment. Still using Santyl to the wound The patient is not describing a lot of pain. She does not describe claudication but her activity sounds very limited especially when she was under isolation. She is still reluctant to go forward with the angiogram that was suggested. Currently she wants to recover fully from Covid infection 2/10; 2-week follow-up. Patient arrives with 100% nonviable surface over the wound on her right dorsal foot. I changed her to [facility] from [place] last week in terms of surface the wound is deteriorated although I  think the surface area is about the same. She is not describing claudication walking about 50 feet The patient tells me she is trying to get in with her primary doctor however there is restrictions with regards to the pandemic. She wants to be referred to nephrology and then to vascular surgery at [facility] I cannot help her with this. I did tell her that her creatinine had normalized via the last lab work I could see done at [facility] in November and her estimated GFR was 53 at that time. By my estimation careful application of contrast dye and preangiogram hydration should be able to get her through the necessary angiography with no complication however she simply does not want to hear this and she is going to put this off until she can get through the [place] system 2/24; 2-week follow-up wound surface area is about the same certainly not worse. She has 100% slough cover we have been using Soyla Dryer, Clatie ([id number]) 3/3; 2-week follow-up. The patient's wound is essentially unchanged. The surface has slough on it no  debridement it certainly does not look perfused. She is worried about the erythema she is having in her foot. She had a cramp in her leg last night but is not complaining of any other type of pain. Is also is concerned about some edema that is not well corrected by the diuretic she is on 3/16; 2-week follow-up. The wound is unchanged in appearance and in size. The patient has 100% nonviable surface. She saw Dr. Bonnee Quin on 07/21/2019 vascular at [facility]. His statement was "chronic limb threatening ischemia". She was actually planned for a right leg arteriogram and intervention today however she states this was postponed and they are supposed to call her this afternoon to reschedule. Her dressing is being changed at [facility] 08/04/2019 upon evaluation today patient has had her evaluation with vascular at [facility]. Subsequently she unfortunately does not have good arterial flow into her  bilateral lower extremities. She actually is doing worse on the left as compared to the right but neither is effectively receiving good blood flow from the heart down into the lower extremity. She tells me that she actually has her dressing changed on a regular basis at [facility]. She is scheduled apparently to have intervention tomorrow in regard to her right lower extremity and then they will proceed from there in regard to the left depend on how things go. 4/14; the patient was admitted to [facility] from 3/26 through 08/07/2019. She underwent an aortogram ultimately underwent a right popliteal orbital atherectomy and angioplasty, a right anterior tibial artery orbital atherectomy and angioplasty and a right dorsalis pedis artery angioplasty. She was kept in hospital for hydration secondary to a mild bump in her creatinine. I see she was back in the vascular clinic complaining of fatigue but this was not felt to be secondary to procedure which was not done under general anesthesia. She was apparently using Medihoney, the nurse at [facility] is changing the dressing 4/21; the patient has been back to see vascular surgery at [facility] yesterday. She had an improvement in her right arterial studies which were initially done on 3/11. Although her ABI initially was 0.83 on 3/11 yesterday at 0.74 there was a fairly marked improvement in her TBI from 0.13-0.52. The patient is not complaining of any pain. We have been placing Iodoflex on the wound under kerlix Coban 4/28; patient arrived in clinic with been using Iodoflex. Still requiring debridement but the wound is measuring smaller 5/5; still using Iodoflex kerlix Coban. Dimensions slightly smaller 5/12 wound is slightly smaller and the surface looks much better. We have been using Iodoflex with kerlix Coban. The patient is not having any pain around the wound She is describing some cramping in her leg at night which seems to be mostly in her upper thigh. She  states its not every night and it does not seem severe. I am unable to pick up any claudication with activity. I looked through care everywhere. I cannot see her actual postoperative noninvasive studies but I can see them referenced in her surgeon's notes. On 4/20 her ABI was 0.74 TBI of 0.52 with biphasic waveforms on the right. On the left an ABI of 0.62 and 0.48 TBI with monophasic and biphasic waveform 5/19; the wound continues to make nice progress. Healthy granulation. Changed to [facility] 6/2; continued improvement in surface area. Healthy granulation she does have some eschar around the wound circumference although I do not think this appears to be inhibiting healing. She is using Hydrofera Blue. 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 F) Treating RN: Cornell Barman Primary Care Provider: Emily Filbert Other Clinician: Referring Provider: Emily Filbert Treating Provider/Extender: Tito Dine in Treatment: 25 Constitutional Sitting or standing Blood Pressure is within target range for patient.. Pulse regular and within target range for patient.Marland Kitchen Respirations regular, non- labored and within target range.. Temperature is normal and within the target range for the patient.Marland Kitchen appears in no distress. Cardiovascular She has a faintly palpable dorsalis pedis and posterior tibial pulse on the right. On the left I really cannot feel them although she is asymptomatic as far as I can tell on the left side no  claudication.. Integumentary (Hair, Skin) No erythema around the wound. Notes Wound exam; surface of the wound appears very healthy. Wound was washed off with saline and gauze. Surface area is better there is no surrounding erythema Electronic Signature(s) Signed: 10/12/2019 4:41:32 PM By: Linton Ham MD Entered By: Linton Ham on 10/12/2019 11:42:38 [name], [name] ([id number]) -------------------------------------------------------------------------------- Physician Orders Details Patient Name: [name] Date of Service: 10/12/2019 10:45 AM Medical Record Number: [id number] Patient Account Number: [id number] Date of Birth/Sex: October 10, 1930 (86 y.o. F) Treating RN: Cornell Barman Primary Care Provider: Emily Filbert Other Clinician: Referring Provider: Emily Filbert Treating Provider/Extender: Tito Dine in Treatment: 35 Verbal / Phone Orders: No Diagnosis Coding Wound Cleansing Wound #2 Right,Dorsal Foot o Clean wound with Normal Saline. Anesthetic (add to Medication List) Wound #2 Right,Dorsal Foot o Topical Lidocaine 4% cream applied to wound bed prior to debridement (In Clinic Only). Skin Barriers/Peri-Wound Care Wound #2 Right,Dorsal Foot o Moisturizing lotion - on leg Primary Wound Dressing Wound #2 Right,Dorsal Foot o Hydrafera Blue Ready Transfer Secondary Dressing Wound #2 Right,Dorsal Foot o ABD pad Dressing Change Frequency Wound #2 Right,Dorsal Foot o Change Dressing Monday, Wednesday, Friday Follow-up Appointments Wound #2 Right,Dorsal Foot o Return Appointment in 2 weeks. Edema Control Wound #2 Right,Dorsal Foot o Kerlix and Coban - Right Lower Extremity o Elevate legs to the level of the heart and pump ankles as often as possible [place] to change dressings Electronic Signature(s) Signed: 10/12/2019 4:41:32 PM By: Linton Ham MD Signed: 10/13/2019 1:40:27 PM By: Gretta Cool, BSN, RN,  CWS, Kim RN, BSN Entered By: Gretta Cool, BSN, RN, CWS, Kim on 10/12/2019 11:17:20 [name], [name] ([id number]) -------------------------------------------------------------------------------- Problem List Details Patient Name: [name] Date of Service: 10/12/2019 10:45 AM Medical Record Number: [id number] Patient Account Number: [id number] Date of Birth/Sex: 06/27/1930 (86 y.o. F) Treating RN: Cornell Barman Primary Care Provider: Emily Filbert Other Clinician: Referring Provider: Emily Filbert Treating Provider/Extender: Tito Dine in Treatment: 25 Active Problems ICD-10 Encounter Code Description Active Date MDM Diagnosis E11.52 Type 2 diabetes mellitus with diabetic peripheral angiopathy with 04/20/2019 No Yes gangrene L97.518 Non-pressure chronic ulcer of other part of right foot with other specified 04/20/2019 No Yes severity E11.42 Type 2 diabetes mellitus with diabetic polyneuropathy 04/20/2019 No Yes E11.621 Type 2 diabetes mellitus with foot ulcer 04/20/2019 No Yes Inactive Problems ICD-10 Code Description Active Date Inactive Date L97.528 Non-pressure chronic ulcer of other part of left foot with other specified 04/20/2019 04/20/2019 severity Resolved Problems  Electronic Signature(s) Signed: 10/12/2019 4:41:32 PM By: Linton Ham MD Entered By: Linton Ham on 10/12/2019 11:37:37 [name], [name] ([id number]) -------------------------------------------------------------------------------- Progress Note Details Patient Name: [name] Date of Service: 10/12/2019 10:45 AM Medical Record Number: [id number] Patient Account Number: [id number] Date of Birth/Sex: 1930/08/25 (86 y.o. F) Treating RN: Cornell Barman Primary Care Provider: Emily Filbert Other Clinician: Referring Provider: Emily Filbert Treating Provider/Extender: Tito Dine in Treatment: 25 Subjective History of Present Illness (HPI) ADMISSION 04/20/2019 This is an 86 year old woman who lives in the  independent part of [place]. She is a type II diabetic on oral agents and insulin with a recent hemoglobin A1c of 6.3 on 10/6. She was initially seen by primary care on 11/12 after dropping a heavy walker on her bilateral dorsal feet. She had blisters on the right and left lateral foot. She was given doxycycline. She was seen the next day in [place] clinic on 11/13 continued on the.C. Bilateral foot x-rays were negative. She was admitted to hospital from 11/17 through 11/27 with acute kidney injury I did not research this although the patient tells me that at discharge her kidney function had returned to normal. During the course of this hospitalization it was noted she had wounds on her bilateral feet and her peripheral pulses were nonpalpable. She was evaluated by Dr. Delana Meyer. It was recommended that she consider angiography on the right leg and it was unlikely that she would heal the right leg as her ABI here was 0.48 with monophasic waveforms on the left her ABI was 1.19 with biphasic waveforms. One would wonder if the ABI was artificially elevated on the left. In any case Dr. Cydney Ok note accurately describes the patient's current state of mind. She does not want to rush into an intervention and she is here for our review of her wounds. Noteworthy that when she arrived I think her renal insufficiency was felt to be prerenal. Her GFR on arrival was 22 now 36. Past medical history includes type 2 diabetes with peripheral neuropathy, atrial fibrillation, renal insufficiency We did not calculate ABIs in our clinic this was 0.48 on the right and 1.19 on the left as noted above. Monophasic waveforms on the right biphasic on the left 12/16; patient admitted to the clinic last week with trauma critical limb ischemia on the right. Today her wound on the left dorsal foot is healed. She still has the black eschar on the dorsal right foot that we have been using Santyl on. There is apparently some  drainage. She arrives in clinic today with more swelling on the right great and the left dorsal surrounding her foot wounds. Some erythema especially on the right. There is some tenderness on the right but not as much as I might expect if this was all infection nevertheless he does have some neuropathy which might be blunting discomfort. She reports no additional trauma. She is at the rehab section of [facility], she lives in the independent part of [place]. She tells me she has some pain in the right leg at night which wakes her up from sleeping although she manages to get back to sleep. She has about 150 foot exercise tolerance and then gets fatigue in her legs. Again I discussed the issue of angiogram with the patient especially on the right. She just will not agree to this. She states she wants the rehab and then go to [facility] where a list of her specialists. She was on Levaquin last week we did  not prescribe this but she is finished. She describes some nausea and loose stools but does not sound dramatically unwell with this Left dorsal foot had some very superficial areas today I think related to swelling. The big problem is on the dorsal right foot black surface eschar with increased swelling. She does not have any pain or tenderness although she has neuropathy. We have been using Santyl. She has PAD and again I think needs an angiogram. 12/30; 2-week follow-up. The areas on the right dorsal foot. Everything is healed on the left. She is still in the rehab part of [place]. She went to see Dr. Lucky Cowboy on 12/29. He reiterated to her he felt that she needed angiography on the right leg for critical limb ischemia. She seemed to have 2 major concerns that is wanting to see a nephrologist prior to procedure in consultation. She does not currently have a nephrologist. She also had no confidence in the original arterial studies that were done in the hospital and wanted them redone. I do not think  Dr. Lucky Cowboy felt that they needed to be redone and wanted to proceed with angiography. At the end of it he simply stated that he would be glad to do the angiogram at any time the patient agreed to proceed I looked briefly at her kidney function which had normalized by the time she left the hospital in late November. I do not know that the patient has any additional concern at this point other than the reduction in GFR associated with her diabetes and normal aging. 1/27; I have not seen this patient in almost 4 weeks. She was apparently isolated in the [facility] facility because of COVID-19. She tells me she was mildly symptomatic but feels better now although she still feels like she is in a "fog" as far as her mental abilities. She states that she is up and walking for short distances with a walker. She would like to go back to her independent apartment. Still using Santyl to the wound The patient is not describing a lot of pain. She does not describe claudication but her activity sounds very limited especially when she was under isolation. She is still reluctant to go forward with the angiogram that was suggested. Currently she wants to recover fully from Covid infection 2/10; 2-week follow-up. Patient arrives with 100% nonviable surface over the wound on her right dorsal foot. I changed her to [facility] from [place] last week in terms of surface the wound is deteriorated although I think the surface area is about the same. She is not describing claudication walking about 50 feet The patient tells me she is trying to get in with her primary doctor however there is restrictions with regards to the pandemic. She wants to be referred to nephrology and then to vascular surgery at [facility] I cannot help her with this. I did tell her that her creatinine had normalized via the last lab work I could see done at [facility] in November and her estimated GFR was 53 at that time. By my estimation careful application of  contrast dye and preangiogram hydration should be able to get her through the necessary angiography with no complication however she simply does not want to hear this and she is going to put this off until she can get through the [place] system 2/24; 2-week follow-up wound surface area is about the same certainly not worse. She has 100% slough cover we have been using Santyl 3/3; 2-week follow-up.  The patient's wound is essentially unchanged. The surface has slough on it no debridement it certainly does not look perfused. She is worried about the erythema she is having in her foot. She had a cramp in her leg last night but is not complaining of any other type of pain. Is also is concerned about some edema that is not well corrected by the diuretic she is on 3/16; 2-week follow-up. The wound is unchanged in appearance and in size. The patient has 100% nonviable surface. She saw Dr. Reginal Lutes, Fence [name] ([id number]) Gari Crown on 07/21/2019 vascular at [facility]. His statement was "chronic limb threatening ischemia". She was actually planned for a right leg arteriogram and intervention today however she states this was postponed and they are supposed to call her this afternoon to reschedule. Her dressing is being changed at [facility] 08/04/2019 upon evaluation today patient has had her evaluation with vascular at [facility]. Subsequently she unfortunately does not have good arterial flow into her bilateral lower extremities. She actually is doing worse on the left as compared to the right but neither is effectively receiving good blood flow from the heart down into the lower extremity. She tells me that she actually has her dressing changed on a regular basis at [facility]. She is scheduled apparently to have intervention tomorrow in regard to her right lower extremity and then they will proceed from there in regard to the left depend on how things go. 4/14; the patient was admitted to [facility] from 3/26 through 08/07/2019.  She underwent an aortogram ultimately underwent a right popliteal orbital atherectomy and angioplasty, a right anterior tibial artery orbital atherectomy and angioplasty and a right dorsalis pedis artery angioplasty. She was kept in hospital for hydration secondary to a mild bump in her creatinine. I see she was back in the vascular clinic complaining of fatigue but this was not felt to be secondary to procedure which was not done under general anesthesia. She was apparently using [employer], the nurse at [facility] is changing the dressing 4/21; the patient has been back to see vascular surgery at [facility] yesterday. She had an improvement in her right arterial studies which were initially done on 3/11. Although her ABI initially was 0.83 on 3/11 yesterday at 0.74 there was a fairly marked improvement in her TBI from 0.13-0.52. The patient is not complaining of any pain. We have been placing Iodoflex on the wound under kerlix Coban 4/28; patient arrived in clinic with been using Iodoflex. Still requiring debridement but the wound is measuring smaller 5/5; still using Iodoflex kerlix Coban. Dimensions slightly smaller 5/12 wound is slightly smaller and the surface looks much better. We have been using Iodoflex with kerlix Coban. The patient is not having any pain around the wound She is describing some cramping in her leg at night which seems to be mostly in her upper thigh. She states its not every night and it does not seem severe. I am unable to pick up any claudication with activity. I looked through care everywhere. I cannot see her actual postoperative noninvasive studies but I can see them referenced in her surgeon's notes. On 4/20 her ABI was 0.74 TBI of 0.52 with biphasic waveforms on the right. On the left an ABI of 0.62 and 0.48 TBI with monophasic and biphasic waveform 5/19; the wound continues to make nice progress. Healthy granulation. Changed to [facility] 6/2; continued improvement in  surface area. Healthy granulation she does have some eschar around the wound circumference although  I do not think this appears to be inhibiting healing. She is using Hydrofera Blue. Dressing being changed at the [facility] facility where she is a resident Objective Constitutional Sitting or standing Blood Pressure is within target range for patient.. Pulse regular and within target range for patient.Marland Kitchen Respirations regular, non- labored and within target range.. Temperature is normal and within the target range for the patient.Marland Kitchen appears in no distress. Vitals Time Taken: 10:50 AM, Height: 69 in, Weight: 195 lbs, BMI: 28.8, Temperature: 98.2 F, Pulse: 58 bpm, Respiratory Rate: 16 breaths/min, Blood Pressure: 120/76 mmHg. Cardiovascular She has a faintly palpable dorsalis pedis and posterior tibial pulse on the right. On the left I really cannot feel them although she is asymptomatic as far as I can tell on the left side no claudication.. General Notes: Wound exam; surface of the wound appears very healthy. Wound was washed off with saline and gauze. Surface area is better there is no surrounding erythema Integumentary (Hair, Skin) No erythema around the wound. Wound #2 status is Open. Original cause of wound was Trauma. The wound is located on the Right,Dorsal Foot. The wound measures 1.6cm length x 1.3cm width x 0.1cm depth; 1.634cm^2 area and 0.163cm^3 volume. There is Fat Layer (Subcutaneous Tissue) Exposed exposed. There is no tunneling or undermining noted. There is a medium amount of serosanguineous drainage noted. The wound margin is thickened. There is large (67-100%) red granulation within the wound bed. There is a small (1-33%) amount of necrotic tissue within the wound bed including Adherent Slough. 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 Anesthetic (add to Medication List): Wound #2 Right,Dorsal Foot: Topical Lidocaine 4% cream applied to wound bed prior to debridement (In Clinic Only). Skin Barriers/Peri-Wound Care: Wound #2 Right,Dorsal Foot: Moisturizing lotion - on leg Primary Wound Dressing: Wound #2 Right,Dorsal Foot: Hydrafera Blue Ready Transfer Secondary Dressing: Wound #2 Right,Dorsal Foot: ABD pad Dressing Change Frequency: Wound #2 Right,Dorsal Foot: Change Dressing Monday, Wednesday, Friday Follow-up Appointments: Wound #2 Right,Dorsal Foot: Return Appointment in 2 weeks. Edema Control: Wound #2 Right,Dorsal Foot: Kerlix and Coban - Right Lower Extremity Elevate legs to the level of the heart and pump ankles as often as possible Home Health: [place] to change dressings 1. We are continuing with Hydrofera Blue as the primary dressing 2. ABDs Kerlix and Coban 3. She has a faint peripheral pulse in the right foot at both the dorsalis pedis and posterior tibia. She is concerned about the left leg I cannot feel pulses in this foot however nor can I elicit any symptoms related to this in the left leg. She has no wounds in this area and no suggestion of claudication. I have no doubt she has follow-up noninvasive studies with vascular surgery at [facility] and this probably will be followed there. I explained this to her. Electronic Signature(s) Signed: 10/12/2019 4:41:32 PM By: Linton Ham MD Entered By: Linton Ham on 10/12/2019 11:43:57 [name], [name] ([id number]) -------------------------------------------------------------------------------- SuperBill Details Patient Name: [name] Date of Service: 10/12/2019 Medical Record Number: [id number] Patient  Account Number: [id number] Date of Birth/Sex: Jul 12, 1930 (86 y.o. F) Treating RN: Cornell Barman Primary Care Provider: Emily Filbert Other Clinician: Referring Provider: Sabra Heck,  Mark Treating Provider/Extender: Tito Dine in Treatment: 25 Diagnosis Coding ICD-10 Codes Code Description E11.52 Type 2 diabetes mellitus with diabetic peripheral angiopathy with gangrene L97.518 Non-pressure chronic ulcer of other part of right foot with other specified severity E11.42 Type 2 diabetes mellitus with diabetic polyneuropathy E11.621 Type 2 diabetes mellitus with foot ulcer Facility Procedures CPT4 Code: [id number] Description: [id number] - WOUND CARE VISIT-LEV 2 EST PT Modifier: Quantity: 1 Physician Procedures CPT4 Code: [id number] Description: [id number] - WC PHYS LEVEL 3 - EST PT Modifier: Quantity: 1 CPT4 Code: Description: ICD-10 Diagnosis Description E11.52 Type 2 diabetes mellitus with diabetic peripheral angiopathy with gangre L97.518 Non-pressure chronic ulcer of other part of right foot with other specif Modifier: ne ied severity Quantity: Electronic Signature(s) Signed: 10/12/2019 4:41:32 PM By: Linton Ham MD Entered By: Linton Ham on 10/12/2019 11:44:18

## 2019-10-26 ENCOUNTER — Other Ambulatory Visit: Payer: Self-pay

## 2019-10-26 ENCOUNTER — Encounter: Payer: Medicare Other | Admitting: Internal Medicine

## 2019-10-26 DIAGNOSIS — E11621 Type 2 diabetes mellitus with foot ulcer: Secondary | ICD-10-CM | POA: Diagnosis not present

## 2019-10-26 NOTE — Progress Notes (Signed)
[place], [place] ([id number]) Visit Report for 10/26/2019 Arrival Information Details Patient Name: Nazaret, Chea [facility] Date of Service: 10/26/2019 11:00 AM Medical Record Number: [id number] Patient Account Number: [id number] Date of Birth/Sex: July 23, 1930 (86 y.o. F) Treating RN: Cornell Barman Primary Care Delorus Langwell: Emily Filbert Other Clinician: Referring Melenie Minniear: Emily Filbert Treating Dianne Bady/Extender: Tito Dine in Treatment: 27 Visit Information History Since Last Visit Added or deleted any medications: No Patient Arrived: Walker Any new allergies or adverse reactions: No Arrival Time: 10:49 Had a fall or experienced change in No Accompanied By: self activities of daily living that may affect Transfer Assistance: None risk of falls: Patient Identification Verified: Yes Signs or symptoms of abuse/neglect since last visito No Secondary Verification Process Completed: Yes Hospitalized since last visit: No Patient Has Alerts: Yes Implantable device outside of the clinic excluding No Patient Alerts: Patient on Blood Thinner cellular tissue based products placed in the center ABI L 1.19 R 0.48 03/2019 since last visit: 2-325mg  Aspirin daily Has Dressing in Place as Prescribed: Yes TBI R .13 L .42 Pain Present Now: No Electronic Signature(s) Signed: 10/26/2019 11:14:05 AM By: Lorine Bears RCP, RRT, CHT Entered By: Lorine Bears on 10/26/2019 10:54:11 Totten, Shakeena ([id number]) -------------------------------------------------------------------------------- Encounter Discharge Information Details Patient Name: [name] Date of Service: 10/26/2019 11:00 AM Medical Record Number: [id number] Patient Account Number: [id number] Date of Birth/Sex: 19-May-1930 (86 y.o. F) Treating RN: Cornell Barman Primary Care Estanislao Harmon: Emily Filbert Other Clinician: Referring Liann Spaeth: Emily Filbert Treating Dantre Yearwood/Extender: Tito Dine in Treatment:  77 Encounter Discharge Information Items Post Procedure Vitals Discharge Condition: Stable Temperature (F): 98.2 Ambulatory Status: Walker Pulse (bpm): 55 Discharge Destination: Home Respiratory Rate (breaths/min): 16 Transportation: Private Auto Blood Pressure (mmHg): 150/58 Accompanied By: self Schedule Follow-up Appointment: Yes Clinical Summary of Care: Electronic Signature(s) Signed: 10/26/2019 5:04:05 PM By: Gretta Cool, BSN, RN, CWS, Kim RN, BSN Entered By: Gretta Cool, BSN, RN, CWS, Kim on 10/26/2019 11:11:10 [name], Kassie ([id number]) -------------------------------------------------------------------------------- Lower Extremity Assessment Details Patient Name: [name] Date of Service: 10/26/2019 11:00 AM Medical Record Number: [id number] Patient Account Number: [id number] Date of Birth/Sex: 1930/09/07 (86 y.o. F) Treating RN: Montey Hora Primary Care Soha Thorup: Emily Filbert Other Clinician: Referring Ellington Cornia: Emily Filbert Treating Ebert Forrester/Extender: Ricard Dillon Weeks in Treatment: 27 Edema Assessment Assessed: [Left: No] [Right: No] Edema: [Left: Ye] [Right: s] Vascular Assessment Pulses: Dorsalis Pedis Palpable: [Right:Yes] Electronic Signature(s) Signed: 10/26/2019 4:35:33 PM By: Montey Hora Entered By: Montey Hora on 10/26/2019 11:00:49 Tindol, Kimra ([id number]) -------------------------------------------------------------------------------- Multi Wound Chart Details Patient Name: [name] Date of Service: 10/26/2019 11:00 AM Medical Record Number: [id number] Patient Account Number: [id number] Date of Birth/Sex: 08-Apr-1931 (86 y.o. F) Treating RN: Cornell Barman Primary Care Ledia Hanford: Emily Filbert Other Clinician: Referring Jazmine Heckman: Emily Filbert Treating Antanisha Mohs/Extender: Tito Dine in Treatment: 27 Vital Signs Height(in): 69 Pulse(bpm): 55 Weight(lbs): 195 Blood Pressure(mmHg): 150/58 Body Mass Index(BMI): 29 Temperature(F):  98.2 Respiratory Rate(breaths/min): 18 Photos: [N/A:N/A] Wound Location: Right, Dorsal Foot N/A N/A Wounding Event: Trauma N/A N/A Primary Etiology: Cellulitis N/A N/A Comorbid History: Cataracts, Arrhythmia, Congestive N/A N/A Heart Failure, Hypertension, Type II Diabetes, Rheumatoid Arthritis Date Acquired: 03/24/2019 N/A N/A Weeks of Treatment: 27 N/A N/A Wound Status: Open N/A N/A Measurements L x W x D (cm) 1.1x0.5x0.1 N/A N/A Area (cm) : 0.432 N/A N/A Volume (cm) : 0.043 N/A N/A % Reduction in Area: 93.50% N/A N/A % Reduction in Volume: 93.50% N/A N/A Classification: Full Thickness Without Exposed N/A N/A Support Structures Exudate Amount: Medium  N/A N/A Exudate Type: Serosanguineous N/A N/A Exudate Color: red, brown N/A N/A Wound Margin: Thickened N/A N/A Granulation Amount: Large (67-100%) N/A N/A Granulation Quality: Red N/A N/A Necrotic Amount: None Present (0%) N/A N/A Exposed Structures: Fat Layer (Subcutaneous Tissue) N/A N/A Exposed: Yes Fascia: No Tendon: No Muscle: No Joint: No Bone: No Epithelialization: None N/A N/A Debridement: Debridement - Excisional N/A N/A Pre-procedure Verification/Time 11:07 N/A N/A Out Taken: Pain Control: Lidocaine N/A N/A Tissue Debrided: Necrotic/Eschar, Subcutaneous, N/A N/A Slough Level: Skin/Subcutaneous Tissue N/A N/A Debridement Area (sq cm): 0.55 N/A N/A Instrument: Curette N/A N/A Bleeding: Minimum N/A N/A Hemostasis Achieved: Pressure N/A N/A Procedure was tolerated well N/A N/A Camplin, Sherly ([id number]) Debridement Treatment Response: Post Debridement 1.1x0.5x0.2 N/A N/A Measurements L x W x D (cm) Post Debridement Volume: 0.086 N/A N/A (cm) Procedures Performed: Debridement N/A N/A Treatment Notes Wound #2 (Right, Dorsal Foot) Notes Hydrofera Blue Ready Foam, kerlix and coban toes to knee Electronic Signature(s) Signed: 10/26/2019 4:26:25 PM By: Linton Ham MD Entered By: Linton Ham on  10/26/2019 11:25:52 Tremain, Lizzet ([id number]) -------------------------------------------------------------------------------- Multi-Disciplinary Care Plan Details Patient Name: [name] Date of Service: 10/26/2019 11:00 AM Medical Record Number: [id number] Patient Account Number: [id number] Date of Birth/Sex: 1930/07/25 (86 y.o. F) Treating RN: Cornell Barman Primary Care Jiovanni Heeter: Emily Filbert Other Clinician: Referring Leanna Hamid: Emily Filbert Treating Yacqub Baston/Extender: Tito Dine in Treatment: 27 Active Inactive Necrotic Tissue Nursing Diagnoses: Impaired tissue integrity related to necrotic/devitalized tissue Knowledge deficit related to management of necrotic/devitalized tissue Goals: Necrotic/devitalized tissue will be minimized in the wound bed Date Initiated: 04/20/2019 Target Resolution Date: 05/04/2019 Goal Status: Active Interventions: Assess patient pain level pre-, during and post procedure and prior to discharge Provide education on necrotic tissue and debridement process Treatment Activities: Apply topical anesthetic as ordered : 04/20/2019 Notes: Venous Leg Ulcer Nursing Diagnoses: Actual venous Insuffiency (use after diagnosis is confirmed) Goals: Patient/caregiver will verbalize understanding of disease process and disease management Date Initiated: 04/20/2019 Target Resolution Date: 05/04/2019 Goal Status: Active Interventions: Assess peripheral edema status every visit. Notes: Wound/Skin Impairment Nursing Diagnoses: Impaired tissue integrity Goals: Patient/caregiver will verbalize understanding of skin care regimen Date Initiated: 04/20/2019 Target Resolution Date: 05/04/2019 Goal Status: Active Ulcer/skin breakdown will have a volume reduction of 30% by week 4 Date Initiated: 04/20/2019 Target Resolution Date: 05/18/2019 Goal Status: Active Interventions: Assess ulceration(s) every visit Treatment Activities: Skin care regimen initiated :  04/20/2019 Topical wound management initiated : 04/20/2019 [name], Roschelle ([id number]) Notes: Electronic Signature(s) Signed: 10/26/2019 5:04:05 PM By: Gretta Cool, BSN, RN, CWS, Kim RN, BSN Entered By: Gretta Cool, BSN, RN, CWS, Kim on 10/26/2019 11:07:36 [name], [place] ([id number]) -------------------------------------------------------------------------------- Pain Assessment Details Patient Name: [name] Date of Service: 10/26/2019 11:00 AM Medical Record Number: [id number] Patient Account Number: [id number] Date of Birth/Sex: Sep 24, 1930 (86 y.o. F) Treating RN: Montey Hora Primary Care Danner Paulding: Emily Filbert Other Clinician: Referring Eveline Sauve: Emily Filbert Treating Jerricka Carvey/Extender: Tito Dine in Treatment: 27 Active Problems Location of Pain Severity and Description of Pain Patient Has Paino No Site Locations Pain Management and Medication Current Pain Management: Electronic Signature(s) Signed: 10/26/2019 4:35:33 PM By: Montey Hora Entered By: Montey Hora on 10/26/2019 10:58:32 Scarbrough, Anhthu ([id number]) -------------------------------------------------------------------------------- Patient/Caregiver Education Details Patient Name: [name] Date of Service: 10/26/2019 11:00 AM Medical Record Number: [id number] Patient Account Number: [id number] Date of Birth/Gender: [birth year] (86 y.o. F) Treating RN: Cornell Barman Primary Care Physician: Emily Filbert Other Clinician: Referring Physician: Emily Filbert Treating Physician/Extender: Tito Dine in Treatment:  27 Education Assessment Education Provided To: Patient Education Topics Provided Wound Debridement: Handouts: Wound Debridement Methods: Demonstration, Explain/Verbal Responses: State content correctly Wound/Skin Impairment: Handouts: Caring for Your Ulcer Methods: Demonstration Responses: State content correctly Electronic Signature(s) Signed: 10/26/2019 5:04:05 PM By: Gretta Cool, BSN, RN, CWS, Kim  RN, BSN Entered By: Gretta Cool, BSN, RN, CWS, Kim on 10/26/2019 11:10:19 [name], Jani ([id number]) -------------------------------------------------------------------------------- Wound Assessment Details Patient Name: [name] Date of Service: 10/26/2019 11:00 AM Medical Record Number: [id number] Patient Account Number: [id number] Date of Birth/Sex: [birth year] (86 y.o. F) Treating RN: Montey Hora Primary Care Aaryn Parrilla: Emily Filbert Other Clinician: Referring Marquice Uddin: Emily Filbert Treating Loza Prell/Extender: Tito Dine in Treatment: 27 Wound Status Wound Number: 2 Primary Cellulitis Etiology: Wound Location: Right, Dorsal Foot Wound Open Wounding Event: Trauma Status: Date Acquired: 03/24/2019 Comorbid Cataracts, Arrhythmia, Congestive Heart Failure, Weeks Of Treatment: 27 History: Hypertension, Type II Diabetes, Rheumatoid Arthritis Clustered Wound: No Photos Wound Measurements Length: (cm) 1.1 Width: (cm) 0.5 Depth: (cm) 0.1 Area: (cm) 0.432 Volume: (cm) 0.043 % Reduction in Area: 93.5% % Reduction in Volume: 93.5% Epithelialization: None Tunneling: No Undermining: No Wound Description Classification: Full Thickness Without Exposed Support Structu Wound Margin: Thickened Exudate Amount: Medium Exudate Type: Serosanguineous Exudate Color: red, brown res Foul Odor After Cleansing: No Slough/Fibrino Yes Wound Bed Granulation Amount: Large (67-100%) Exposed Structure Granulation Quality: Red Fascia Exposed: No Necrotic Amount: None Present (0%) Fat Layer (Subcutaneous Tissue) Exposed: Yes Tendon Exposed: No Muscle Exposed: No Joint Exposed: No Bone Exposed: No Treatment Notes Wound #2 (Right, Dorsal Foot) Notes Hydrofera Blue Ready Foam, kerlix and coban toes to knee Electronic Signature(s) Signed: 10/26/2019 4:35:33 PM By: Teola Bradley, Aleyda ([id number]) Entered By: Montey Hora on 10/26/2019 11:00:05 Buda, Hanny  ([id number]) -------------------------------------------------------------------------------- Vitals Details Patient Name: [name] Date of Service: 10/26/2019 11:00 AM Medical Record Number: [id number] Patient Account Number: [id number] Date of Birth/Sex: 07/12/1930 (86 y.o. F) Treating RN: Cornell Barman Primary Care Jaiden Wahab: Emily Filbert Other Clinician: Referring Eriyanna Kofoed: Emily Filbert Treating Deklyn Gibbon/Extender: Tito Dine in Treatment: 27 Vital Signs Time Taken: 10:50 Temperature (F): 98.2 Height (in): 69 Pulse (bpm): 55 Weight (lbs): 195 Respiratory Rate (breaths/min): 18 Body Mass Index (BMI): 28.8 Blood Pressure (mmHg): 150/58 Reference Range: 80 - 120 mg / dl Electronic Signature(s) Signed: 10/26/2019 11:14:05 AM By: Lorine Bears RCP, RRT, CHT Entered By: Lorine Bears on 10/26/2019 10:54:37

## 2019-10-26 NOTE — Progress Notes (Signed)
Hachita, Mathew ([id number]) Visit Report for 10/26/2019 Debridement Details Patient Name: [name], [name] [facility] Date of Service: 10/26/2019 11:00 AM Medical Record Number: [id number] Patient Account Number: [id number] Date of Birth/Sex: 08/16/1930 (86 y.o. F) Treating RN: Cornell Barman Primary Care Provider: Emily Filbert Other Clinician: Referring Provider: Emily Filbert Treating Provider/Extender: Tito Dine in Treatment: 27 Debridement Performed for Wound #2 Right,Dorsal Foot Assessment: Performed By: Physician Ricard Dillon, MD Debridement Type: Debridement Level of Consciousness (Pre- Awake and Alert procedure): Pre-procedure Verification/Time Out Yes - 11:07 Taken: Start Time: 11:07 Pain Control: Lidocaine Total Area Debrided (L x W): 1.1 (cm) x 0.5 (cm) = 0.55 (cm) Tissue and other material Non-Viable, Eschar, Slough, Subcutaneous, Slough debrided: Level: Skin/Subcutaneous Tissue Debridement Description: Excisional Instrument: Curette Bleeding: Minimum Hemostasis Achieved: Pressure Response to Treatment: Procedure was tolerated well Level of Consciousness (Post- Awake and Alert procedure): Post Debridement Measurements of Total Wound Length: (cm) 1.1 Width: (cm) 0.5 Depth: (cm) 0.2 Volume: (cm) 0.086 Character of Wound/Ulcer Post Debridement: Stable Post Procedure Diagnosis Same as Pre-procedure Electronic Signature(s) Signed: 10/26/2019 4:26:25 PM By: Linton Ham MD Signed: 10/26/2019 5:04:05 PM By: Gretta Cool, BSN, RN, CWS, Kim RN, BSN Entered By: Linton Ham on 10/26/2019 11:26:06 [name], [name] ([id number]) -------------------------------------------------------------------------------- HPI Details Patient Name: [name] Date of Service: 10/26/2019 11:00 AM Medical Record Number: [id number] Patient Account Number: [id number] Date of Birth/Sex: October 19, 1930 (86 y.o. F) Treating RN: Cornell Barman Primary Care Provider: Emily Filbert Other  Clinician: Referring Provider: Emily Filbert Treating Provider/Extender: Tito Dine in Treatment: 23 History of Present Illness HPI Description: ADMISSION 04/20/2019 This is an 86 year old woman who lives in the independent part of [place]. She is a type II diabetic on oral agents and insulin with a recent hemoglobin A1c of 6.3 on 10/6. She was initially seen by primary care on 11/12 after dropping a heavy walker on her bilateral dorsal feet. She had blisters on the right and left lateral foot. She was given doxycycline. She was seen the next day in [place] [facility] on 11/13 continued on the.C. Bilateral foot x-rays were negative. She was admitted to hospital from 11/17 through 11/27 with acute kidney injury I did not research this although the patient tells me that at discharge her kidney function had returned to normal. During the course of this hospitalization it was noted she had wounds on her bilateral feet and her peripheral pulses were nonpalpable. She was evaluated by Dr. Delana Meyer. It was recommended that she consider angiography on the right leg and it was unlikely that she would heal the right leg as her ABI here was 0.48 with monophasic waveforms on the left her ABI was 1.19 with biphasic waveforms. One would wonder if the ABI was artificially elevated on the left. In any case Dr. Cydney Ok note accurately describes the patient's current state of mind. She does not want to rush into an intervention and she is here for our review of her wounds. Noteworthy that when she arrived I think her renal insufficiency was felt to be prerenal. Her GFR on arrival was 22 now 36. Past medical history includes type 2 diabetes with peripheral neuropathy, atrial fibrillation, renal insufficiency We did not calculate ABIs in our clinic this was 0.48 on the right and 1.19 on the left as noted above. Monophasic waveforms on the right biphasic on the left 12/16; patient admitted to the clinic  last week with trauma critical limb ischemia on the right. Today her wound on the left dorsal foot is healed.  She still has the black eschar on the dorsal right foot that we have been using Santyl on. There is apparently some drainage. She arrives in clinic today with more swelling on the right great and the left dorsal surrounding her foot wounds. Some erythema especially on the right. There is some tenderness on the right but not as much as I might expect if this was all infection nevertheless he does have some neuropathy which might be blunting discomfort. She reports no additional trauma. She is at the rehab section of [facility], she lives in the independent part of [place]. She tells me she has some pain in the right leg at night which wakes her up from sleeping although she manages to get back to sleep. She has about 150 foot exercise tolerance and then gets fatigue in her legs. Again I discussed the issue of angiogram with the patient especially on the right. She just will not agree to this. She states she wants the rehab and then go to [facility] where a list of her specialists. She was on Levaquin last week we did not prescribe this but she is finished. She describes some nausea and loose stools but does not sound dramatically unwell with this Left dorsal foot had some very superficial areas today I think related to swelling. The big problem is on the dorsal right foot black surface eschar with increased swelling. She does not have any pain or tenderness although she has neuropathy. We have been using Santyl. She has PAD and again I think needs an angiogram. 12/30; 2-week follow-up. The areas on the right dorsal foot. Everything is healed on the left. She is still in the rehab part of [place]. She went to see Dr. Lucky Cowboy on 12/29. He reiterated to her he felt that she needed angiography on the right leg for critical limb ischemia. She seemed to have 2 major concerns that is wanting to see a  nephrologist prior to procedure in consultation. She does not currently have a nephrologist. She also had no confidence in the original arterial studies that were done in the hospital and wanted them redone. I do not think Dr. Lucky Cowboy felt that they needed to be redone and wanted to proceed with angiography. At the end of it he simply stated that he would be glad to do the angiogram at any time the patient agreed to proceed I looked briefly at her kidney function which had normalized by the time she left the hospital in late November. I do not know that the patient has any additional concern at this point other than the reduction in GFR associated with her diabetes and normal aging. 1/27; I have not seen this patient in almost 4 weeks. She was apparently isolated in the [facility] facility because of COVID-19. She tells me she was mildly symptomatic but feels better now although she still feels like she is in a "fog" as far as her mental abilities. She states that she is up and walking for short distances with a walker. She would like to go back to her independent apartment. Still using Santyl to the wound The patient is not describing a lot of pain. She does not describe claudication but her activity sounds very limited especially when she was under isolation. She is still reluctant to go forward with the angiogram that was suggested. Currently she wants to recover fully from Covid infection 2/10; 2-week follow-up. Patient arrives with 100% nonviable surface over the wound on her  right dorsal foot. I changed her to [facility] from [place] last week in terms of surface the wound is deteriorated although I think the surface area is about the same. She is not describing claudication walking about 50 feet The patient tells me she is trying to get in with her primary doctor however there is restrictions with regards to the pandemic. She wants to be referred to nephrology and then to vascular surgery at  [facility] I cannot help her with this. I did tell her that her creatinine had normalized via the last lab work I could see done at [facility] in November and her estimated GFR was 53 at that time. By my estimation careful application of contrast dye and preangiogram hydration should be able to get her through the necessary angiography with no complication however she simply does not want to hear this and she is going to put this off until she can get through the [place] system 2/24; 2-week follow-up wound surface area is about the same certainly not worse. She has 100% slough cover we have been using Santyl 3/3; 2-week follow-up. The patient's wound is essentially unchanged. The surface has slough on it no debridement it certainly does not look perfused. She is worried about the erythema she is having in her foot. She had a cramp in her leg last night but is not complaining of any other type of pain. Is also is concerned about some edema that is not well corrected by the diuretic she is on 3/16; 2-week follow-up. The wound is unchanged in appearance and in size. The patient has 100% nonviable surface. She saw Dr. Reginal Lutes, [place] ([id number]) Gari Crown on 07/21/2019 vascular at [facility]. His statement was "chronic limb threatening ischemia". She was actually planned for a right leg arteriogram and intervention today however she states this was postponed and they are supposed to call her this afternoon to reschedule. Her dressing is being changed at [facility] 08/04/2019 upon evaluation today patient has had her evaluation with vascular at [facility]. Subsequently she unfortunately does not have good arterial flow into her bilateral lower extremities. She actually is doing worse on the left as compared to the right but neither is effectively receiving good blood flow from the heart down into the lower extremity. She tells me that she actually has her dressing changed on a regular basis at [facility]. She is scheduled  apparently to have intervention tomorrow in regard to her right lower extremity and then they will proceed from there in regard to the left depend on how things go. 4/14; the patient was admitted to [facility] from 3/26 through 08/07/2019. She underwent an aortogram ultimately underwent a right popliteal orbital atherectomy and angioplasty, a right anterior tibial artery orbital atherectomy and angioplasty and a right dorsalis pedis artery angioplasty. She was kept in hospital for hydration secondary to a mild bump in her creatinine. I see she was back in the vascular clinic complaining of fatigue but this was not felt to be secondary to procedure which was not done under general anesthesia. She was apparently using Medihoney, the nurse at [facility] is changing the dressing 4/21; the patient has been back to see vascular surgery at [facility] yesterday. She had an improvement in her right arterial studies which were initially done on 3/11. Although her ABI initially was 0.83 on 3/11 yesterday at 0.74 there was a fairly marked improvement in her TBI from 0.13-0.52. The patient is not complaining of any pain. We have been placing  Iodoflex on the wound under kerlix Coban 4/28; patient arrived in clinic with been using Iodoflex. Still requiring debridement but the wound is measuring smaller 5/5; still using Iodoflex kerlix Coban. Dimensions slightly smaller 5/12 wound is slightly smaller and the surface looks much better. We have been using Iodoflex with kerlix Coban. The patient is not having any pain around the wound She is describing some cramping in her leg at night which seems to be mostly in her upper thigh. She states its not every night and it does not seem severe. I am unable to pick up any claudication with activity. I looked through care everywhere. I cannot see her actual postoperative noninvasive studies but I can see them referenced in her surgeon's notes. On 4/20 her ABI was 0.74 TBI of 0.52 with  biphasic waveforms on the right. On the left an ABI of 0.62 and 0.48 TBI with monophasic and biphasic waveform 5/19; the wound continues to make nice progress. Healthy granulation. Changed to [facility] 6/2; continued improvement in surface area. Healthy granulation she does have some eschar around the wound circumference although I do not think this appears to be inhibiting healing. She is using Hydrofera Blue. Dressing being changed at the [facility] facility where she is a resident 6/16; 2-week follow-up. Using Hydrofera Blue. Continued improvement in surface area. Electronic Signature(s) Signed: 10/26/2019 4:26:25 PM By: Linton Ham MD Entered By: Linton Ham on 10/26/2019 11:26:37 [name], [name] ([id number]) -------------------------------------------------------------------------------- Physical Exam Details Patient Name: [name] Date of Service: 10/26/2019 11:00 AM Medical Record Number: [id number] Patient Account Number: [id number] Date of Birth/Sex: 24-Dec-1930 (86 y.o. F) Treating RN: Cornell Barman Primary Care Provider: Emily Filbert Other Clinician: Referring Provider: Emily Filbert Treating Provider/Extender: Tito Dine in Treatment: 27 Notes Wound exam; the wound had a considerable amount of surrounding eschar which I removed with a #5 curette and then I took slough off the surface. No evidence of surrounding infection. Electronic Signature(s) Signed: 10/26/2019 4:26:25 PM By: Linton Ham MD Entered By: Linton Ham on 10/26/2019 11:27:19 [name], [name] ([id number]) -------------------------------------------------------------------------------- Physician Orders Details Patient Name: [name] Date of Service: 10/26/2019 11:00 AM Medical Record Number: [id number] Patient Account Number: [id number] Date of Birth/Sex: 1930/06/08 (86 y.o. F) Treating RN: Cornell Barman Primary Care Provider: Emily Filbert Other Clinician: Referring Provider: Emily Filbert Treating Provider/Extender: Tito Dine in Treatment: 29 Verbal / Phone Orders: No Diagnosis Coding Wound Cleansing Wound #2 Right,Dorsal Foot o Clean wound with Normal Saline. Anesthetic (add to Medication List) Wound #2 Right,Dorsal Foot o Topical Lidocaine 4% cream applied to wound bed prior to debridement (In Clinic Only). Skin Barriers/Peri-Wound Care Wound #2 Right,Dorsal Foot o Moisturizing lotion - on leg Primary Wound Dressing Wound #2 Right,Dorsal Foot o Hydrafera Blue Ready Transfer Secondary Dressing Wound #2 Right,Dorsal Foot o ABD pad Dressing Change Frequency Wound #2 Right,Dorsal Foot o Change Dressing Monday, Wednesday, Friday Follow-up Appointments Wound #2 Right,Dorsal Foot o Return Appointment in 2 weeks. Edema Control Wound #2 Right,Dorsal Foot o Kerlix and Coban - Right Lower Extremity o Elevate legs to the level of the heart and pump ankles as often as possible [place] to change dressings Electronic Signature(s) Signed: 10/26/2019 4:26:25 PM By: Linton Ham MD Signed: 10/26/2019 5:04:05 PM By: Gretta Cool, BSN, RN, CWS, Kim RN, BSN Entered By: Gretta Cool, BSN, RN, CWS, Kim on 10/26/2019 11:09:45 [name], [name] ([id number]) -------------------------------------------------------------------------------- Problem List Details Patient Name: [name] Date of Service: 10/26/2019  11:00 AM Medical Record Number: [id number] Patient Account Number: [id number] Date of Birth/Sex: 09/24/1930 (86 y.o. F) Treating RN: Cornell Barman Primary Care Provider: Emily Filbert Other Clinician: Referring Provider: Emily Filbert Treating Provider/Extender: Tito Dine in Treatment: 27 Active Problems ICD-10 Encounter Code Description Active Date MDM Diagnosis E11.52 Type 2 diabetes mellitus with diabetic peripheral angiopathy with 04/20/2019 No Yes gangrene L97.518 Non-pressure chronic  ulcer of other part of right foot with other specified 04/20/2019 No Yes severity E11.42 Type 2 diabetes mellitus with diabetic polyneuropathy 04/20/2019 No Yes E11.621 Type 2 diabetes mellitus with foot ulcer 04/20/2019 No Yes Inactive Problems ICD-10 Code Description Active Date Inactive Date L97.528 Non-pressure chronic ulcer of other part of left foot with other specified 04/20/2019 04/20/2019 severity Resolved Problems Electronic Signature(s) Signed: 10/26/2019 4:26:25 PM By: Linton Ham MD Entered By: Linton Ham on 10/26/2019 11:25:42 [place], [place] ([id number]) -------------------------------------------------------------------------------- Progress Note Details Patient Name: [name] Date of Service: 10/26/2019 11:00 AM Medical Record Number: [id number] Patient Account Number: [id number] Date of Birth/Sex: [birth year] (86 y.o. F) Treating RN: Cornell Barman Primary Care Provider: Emily Filbert Other Clinician: Referring Provider: Emily Filbert Treating Provider/Extender: Tito Dine in Treatment: 27 Subjective History of Present Illness (HPI) ADMISSION 04/20/2019 This is an 86 year old woman who lives in the independent part of [place]. She is a type II diabetic on oral agents and insulin with a recent hemoglobin A1c of 6.3 on 10/6. She was initially seen by primary care on 11/12 after dropping a heavy walker on her bilateral dorsal feet. She had blisters on the right and left lateral foot. She was given doxycycline. She was seen the next day in [place] clinic on 11/13 continued on the.C. Bilateral foot x-rays were negative. She was admitted to hospital from 11/17 through 11/27 with acute kidney injury I did not research this although the patient tells me that at discharge her kidney function had returned to normal. During the course of this hospitalization it was noted she had wounds on her bilateral feet and her peripheral pulses were nonpalpable. She was  evaluated by Dr. Delana Meyer. It was recommended that she consider angiography on the right leg and it was unlikely that she would heal the right leg as her ABI here was 0.48 with monophasic waveforms on the left her ABI was 1.19 with biphasic waveforms. One would wonder if the ABI was artificially elevated on the left. In any case Dr. Cydney Ok note accurately describes the patient's current state of mind. She does not want to rush into an intervention and she is here for our review of her wounds. Noteworthy that when she arrived I think her renal insufficiency was felt to be prerenal. Her GFR on arrival was 22 now 36. Past medical history includes type 2 diabetes with peripheral neuropathy, atrial fibrillation, renal insufficiency We did not calculate ABIs in our clinic this was 0.48 on the right and 1.19 on the left as noted above. Monophasic waveforms on the right biphasic on the left 12/16; patient admitted to the clinic last week with trauma critical limb ischemia on the right. Today her wound on the left dorsal foot is healed. She still has the black eschar on the dorsal right foot that we have been using Santyl on. There is apparently some drainage. She arrives in clinic today with more swelling on the right great and the left dorsal surrounding her foot wounds. Some erythema especially on the right. There is some tenderness on the right but not as  much as I might expect if this was all infection nevertheless he does have some neuropathy which might be blunting discomfort. She reports no additional trauma. She is at the rehab section of [facility], she lives in the independent part of [place]. She tells me she has some pain in the right leg at night which wakes her up from sleeping although she manages to get back to sleep. She has about 150 foot exercise tolerance and then gets fatigue in her legs. Again I discussed the issue of angiogram with the patient especially on the right. She just will  not agree to this. She states she wants the rehab and then go to [facility] where a list of her specialists. She was on Levaquin last week we did not prescribe this but she is finished. She describes some nausea and loose stools but does not sound dramatically unwell with this Left dorsal foot had some very superficial areas today I think related to swelling. The big problem is on the dorsal right foot black surface eschar with increased swelling. She does not have any pain or tenderness although she has neuropathy. We have been using Santyl. She has PAD and again I think needs an angiogram. 12/30; 2-week follow-up. The areas on the right dorsal foot. Everything is healed on the left. She is still in the rehab part of [place]. She went to see Dr. Lucky Cowboy on 12/29. He reiterated to her he felt that she needed angiography on the right leg for critical limb ischemia. She seemed to have 2 major concerns that is wanting to see a nephrologist prior to procedure in consultation. She does not currently have a nephrologist. She also had no confidence in the original arterial studies that were done in the hospital and wanted them redone. I do not think Dr. Lucky Cowboy felt that they needed to be redone and wanted to proceed with angiography. At the end of it he simply stated that he would be glad to do the angiogram at any time the patient agreed to proceed I looked briefly at her kidney function which had normalized by the time she left the hospital in late November. I do not know that the patient has any additional concern at this point other than the reduction in GFR associated with her diabetes and normal aging. 1/27; I have not seen this patient in almost 4 weeks. She was apparently isolated in the [facility] facility because of COVID-19. She tells me she was mildly symptomatic but feels better now although she still feels like she is in a "fog" as far as her mental abilities. She states that she is up and walking for  short distances with a walker. She would like to go back to her independent apartment. Still using Santyl to the wound The patient is not describing a lot of pain. She does not describe claudication but her activity sounds very limited especially when she was under isolation. She is still reluctant to go forward with the angiogram that was suggested. Currently she wants to recover fully from Covid infection 2/10; 2-week follow-up. Patient arrives with 100% nonviable surface over the wound on her right dorsal foot. I changed her to [facility] from [place] last week in terms of surface the wound is deteriorated although I think the surface area is about the same. She is not describing claudication walking about 50 feet The patient tells me she is trying to get in with her primary doctor however there is restrictions  with regards to the pandemic. She wants to be referred to nephrology and then to vascular surgery at [facility] I cannot help her with this. I did tell her that her creatinine had normalized via the last lab work I could see done at [facility] in November and her estimated GFR was 53 at that time. By my estimation careful application of contrast dye and preangiogram hydration should be able to get her through the necessary angiography with no complication however she simply does not want to hear this and she is going to put this off until she can get through the [place] system 2/24; 2-week follow-up wound surface area is about the same certainly not worse. She has 100% slough cover we have been using Santyl 3/3; 2-week follow-up. The patient's wound is essentially unchanged. The surface has slough on it no debridement it certainly does not look perfused. She is worried about the erythema she is having in her foot. She had a cramp in her leg last night but is not complaining of any other type of pain. Is also is concerned about some edema that is not well corrected by the diuretic she is on 3/16; 2-week  follow-up. The wound is unchanged in appearance and in size. The patient has 100% nonviable surface. She saw Dr. Reginal Lutes, [place] ([id number]) Gari Crown on 07/21/2019 vascular at [facility]. His statement was "chronic limb threatening ischemia". She was actually planned for a right leg arteriogram and intervention today however she states this was postponed and they are supposed to call her this afternoon to reschedule. Her dressing is being changed at [facility] 08/04/2019 upon evaluation today patient has had her evaluation with vascular at [facility]. Subsequently she unfortunately does not have good arterial flow into her bilateral lower extremities. She actually is doing worse on the left as compared to the right but neither is effectively receiving good blood flow from the heart down into the lower extremity. She tells me that she actually has her dressing changed on a regular basis at [facility]. She is scheduled apparently to have intervention tomorrow in regard to her right lower extremity and then they will proceed from there in regard to the left depend on how things go. 4/14; the patient was admitted to [facility] from 3/26 through 08/07/2019. She underwent an aortogram ultimately underwent a right popliteal orbital atherectomy and angioplasty, a right anterior tibial artery orbital atherectomy and angioplasty and a right dorsalis pedis artery angioplasty. She was kept in hospital for hydration secondary to a mild bump in her creatinine. I see she was back in the vascular clinic complaining of fatigue but this was not felt to be secondary to procedure which was not done under general anesthesia. She was apparently using Medih[employer], the nurse at [facility] is changing the dressing 4/21; the patient has been back to see vascular surgery at [facility] yesterday. She had an improvement in her right arterial studies which were initially done on 3/11. Although her ABI initially was 0.83 on 3/11 yesterday at 0.74 there  was a fairly marked improvement in her TBI from 0.13-0.52. The patient is not complaining of any pain. We have been placing Iodoflex on the wound under kerlix Coban 4/28; patient arrived in clinic with been using Iodoflex. Still requiring debridement but the wound is measuring smaller 5/5; still using Iodoflex kerlix Coban. Dimensions slightly smaller 5/12 wound is slightly smaller and the surface looks much better. We have been using Iodoflex with kerlix Coban. The patient is not having  any pain around the wound She is describing some cramping in her leg at night which seems to be mostly in her upper thigh. She states its not every night and it does not seem severe. I am unable to pick up any claudication with activity. I looked through care everywhere. I cannot see her actual postoperative noninvasive studies but I can see them referenced in her surgeon's notes. On 4/20 her ABI was 0.74 TBI of 0.52 with biphasic waveforms on the right. On the left an ABI of 0.62 and 0.48 TBI with monophasic and biphasic waveform 5/19; the wound continues to make nice progress. Healthy granulation. Changed to [facility] 6/2; continued improvement in surface area. Healthy granulation she does have some eschar around the wound circumference although I do not think this appears to be inhibiting healing. She is using Hydrofera Blue. Dressing being changed at the [facility] facility where she is a resident 6/16; 2-week follow-up. Using Hydrofera Blue. Continued improvement in surface area. Objective Constitutional Vitals Time Taken: 10:50 AM, Height: 69 in, Weight: 195 lbs, BMI: 28.8, Temperature: 98.2 F, Pulse: 55 bpm, Respiratory Rate: 18 breaths/min, Blood Pressure: 150/58 mmHg. Integumentary (Hair, Skin) Wound #2 status is Open. Original cause of wound was Trauma. The wound is located on the Right,Dorsal Foot. The wound measures 1.1cm length x 0.5cm width x 0.1cm depth; 0.432cm^2 area and 0.043cm^3 volume.  There is Fat Layer (Subcutaneous Tissue) Exposed exposed. There is no tunneling or undermining noted. There is a medium amount of serosanguineous drainage noted. The wound margin is thickened. There is large (67-100%) red granulation within the wound bed. There is no necrotic tissue within the wound bed. 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 There was a Excisional Skin/Subcutaneous Tissue Debridement with a total area of 0.55 sq cm performed by Ricard Dillon, MD. With the following instrument(s): Curette to remove Non- Schowalter, Shella ([id number]) Viable tissue/material. Material removed includes Eschar, Subcutaneous Tissue, and Slough after achieving pain control using Lidocaine. No specimens were taken. A time out was conducted at 11:07, prior to the start of the procedure. A Minimum amount of bleeding was controlled with Pressure. The procedure was tolerated well. Post Debridement Measurements: 1.1cm length x 0.5cm width x 0.2cm depth; 0.086cm^3 volume. Character of Wound/Ulcer Post Debridement is stable. Post procedure Diagnosis Wound #2: Same as Pre-Procedure Plan Wound Cleansing: Wound #2 Right,Dorsal Foot: Clean wound with Normal Saline. Anesthetic (add to Medication List): Wound #2 Right,Dorsal Foot: Topical Lidocaine 4% cream applied to wound bed prior to debridement (In Clinic Only). Skin Barriers/Peri-Wound Care: Wound #2 Right,Dorsal Foot: Moisturizing lotion - on leg Primary Wound Dressing: Wound #2 Right,Dorsal Foot: Hydrafera Blue Ready Transfer Secondary Dressing: Wound #2 Right,Dorsal Foot: ABD pad Dressing  Change Frequency: Wound #2 Right,Dorsal Foot: Change Dressing Monday, Wednesday, Friday Follow-up Appointments: Wound #2 Right,Dorsal Foot: Return Appointment in 2 weeks. Edema Control: Wound #2 Right,Dorsal Foot: Kerlix and Coban - Right Lower Extremity Elevate legs to the level of the heart and pump ankles as often as possible Home Health: [place] to change dressings 1. We are continuing with Hydrofera Blue/ABDs/Curlex and Coban 2. She has PAD and has been revascularized. She  is not complaining of any particular difficulties with regards to this no obvious claudication Electronic Signature(s) Signed: 10/26/2019 4:26:25 PM By: Linton Ham MD Entered By: Linton Ham on 10/26/2019 11:28:08 [name], [name] ([id number]) -------------------------------------------------------------------------------- SuperBill Details Patient Name: [name] Date of Service: 10/26/2019 Medical Record Number: [id number] Patient Account Number: [id number] Date of Birth/Sex: [birth year] (86 y.o. F) Treating RN: Cornell Barman Primary Care Provider: Emily Filbert Other Clinician: Referring Provider: Emily Filbert Treating Provider/Extender: Tito Dine in Treatment: 27 Diagnosis Coding ICD-10 Codes Code Description E11.52 Type 2 diabetes mellitus with diabetic peripheral angiopathy with gangrene L97.518 Non-pressure chronic ulcer of other part of right foot with other specified severity E11.42 Type 2 diabetes mellitus with diabetic polyneuropathy E11.621 Type 2 diabetes mellitus with foot ulcer Facility Procedures CPT4 Code: [id number] Description: 96438 - DEB SUBQ TISSUE 20 SQ CM/< Modifier: Quantity: 1 CPT4 Code: Description: ICD-10 Diagnosis Description E11.52 Type 2 diabetes mellitus with diabetic peripheral angiopathy with gangren L97.518 Non-pressure chronic ulcer of other part of right foot with other specifi Modifier: e ed  severity Quantity: Physician Procedures CPT4 Code: [id number] Description: 75436 - WC PHYS SUBQ TISS 20 SQ CM Modifier: Quantity: 1 CPT4 Code: Description: ICD-10 Diagnosis Description E11.52 Type 2 diabetes mellitus with diabetic peripheral angiopathy with gangren L97.518 Non-pressure chronic ulcer of other part of right foot with other specifi Modifier: e ed severity Quantity: Electronic Signature(s) Signed: 10/26/2019 4:26:25 PM By: Linton Ham MD Entered By: Linton Ham on 10/26/2019 11:28:24

## 2019-11-08 NOTE — Progress Notes (Signed)
Carter, Kayani ([id number]) Visit Report for 09/21/2019 HPI Details Patient Name: [name], [name] [facility] Date of Service: 09/21/2019 11:00 AM Medical Record Number: [id number] Patient Account Number: [id number] Date of Birth/Sex: [birth year] (86 y.o. F) Treating RN: Cornell Barman Primary Care Provider: Emily Filbert Other Clinician: Referring Provider: Emily Filbert Treating Provider/Extender: Tito Dine in Treatment: 22 History of Present Illness HPI Description: ADMISSION 04/20/2019 This is an 86 year old woman who lives in the independent part of [place]. She is a type II diabetic on oral agents and insulin with a recent hemoglobin A1c of 6.3 on 10/6. She was initially seen by primary care on 11/12 after dropping a heavy walker on her bilateral dorsal feet. She had blisters on the right and left lateral foot. She was given doxycycline. She was seen the next day in [facility] on 11/13 continued on the.C. Bilateral foot x-rays were negative. She was admitted to hospital from 11/17 through 11/27 with acute kidney injury I did not research this although the patient tells me that at discharge her kidney function had returned to normal. During the course of this hospitalization it was noted she had wounds on her bilateral feet and her peripheral pulses were nonpalpable. She was evaluated by Dr. Delana Meyer. It was recommended that she consider angiography on the right leg and it was unlikely that she would heal the right leg as her ABI here was 0.48 with monophasic waveforms on the left her ABI was 1.19 with biphasic waveforms. One would wonder if the ABI was artificially elevated on the left. In any case Dr. Cydney Ok note accurately describes the patient's current state of mind. She does not want to rush into an intervention and she is here for our review of her wounds. Noteworthy that when she arrived I think her renal insufficiency was felt to be prerenal. Her GFR on arrival was 22 now 36. Past  medical history includes type 2 diabetes with peripheral neuropathy, atrial fibrillation, renal insufficiency We did not calculate ABIs in our clinic this was 0.48 on the right and 1.19 on the left as noted above. Monophasic waveforms on the right biphasic on the left 12/16; patient admitted to the clinic last week with trauma critical limb ischemia on the right. Today her wound on the left dorsal foot is healed. She still has the black eschar on the dorsal right foot that we have been using Santyl on. There is apparently some drainage. She arrives in clinic today with more swelling on the right great and the left dorsal surrounding her foot wounds. Some erythema especially on the right. There is some tenderness on the right but not as much as I might expect if this was all infection nevertheless he does have some neuropathy which might be blunting discomfort. She reports no additional trauma. She is at the rehab section of [facility], she lives in the independent part of [place]. She tells me she has some pain in the right leg at night which wakes her up from sleeping although she manages to get back to sleep. She has about 150 foot exercise tolerance and then gets fatigue in her legs. Again I discussed the issue of angiogram with the patient especially on the right. She just will not agree to this. She states she wants the rehab and then go to [facility] where a list of her specialists. She was on Levaquin last week we did not prescribe this but she is finished. She describes some nausea and loose stools but does  not sound dramatically unwell with this Left dorsal foot had some very superficial areas today I think related to swelling. The big problem is on the dorsal right foot black surface eschar with increased swelling. She does not have any pain or tenderness although she has neuropathy. We have been using Santyl. She has PAD and again I think needs an angiogram. 12/30; 2-week follow-up. The areas  on the right dorsal foot. Everything is healed on the left. She is still in the rehab part of [place]. She went to see Dr. Lucky Cowboy on 12/29. He reiterated to her he felt that she needed angiography on the right leg for critical limb ischemia. She seemed to have 2 major concerns that is wanting to see a nephrologist prior to procedure in consultation. She does not currently have a nephrologist. She also had no confidence in the original arterial studies that were done in the hospital and wanted them redone. I do not think Dr. Lucky Cowboy felt that they needed to be redone and wanted to proceed with angiography. At the end of it he simply stated that he would be glad to do the angiogram at any time the patient agreed to proceed I looked briefly at her kidney function which had normalized by the time she left the hospital in late November. I do not know that the patient has any additional concern at this point other than the reduction in GFR associated with her diabetes and normal aging. 1/27; I have not seen this patient in almost 4 weeks. She was apparently isolated in the [facility] facility because of COVID-19. She tells me she was mildly symptomatic but feels better now although she still feels like she is in a "fog" as far as her mental abilities. She states that she is up and walking for short distances with a walker. She would like to go back to her independent apartment. Still using Santyl to the wound The patient is not describing a lot of pain. She does not describe claudication but her activity sounds very limited especially when she was under isolation. She is still reluctant to go forward with the angiogram that was suggested. Currently she wants to recover fully from Covid infection 2/10; 2-week follow-up. Patient arrives with 100% nonviable surface over the wound on her right dorsal foot. I changed her to [facility] from [place] last week in terms of surface the wound is deteriorated although I  think the surface area is about the same. She is not describing claudication walking about 50 feet The patient tells me she is trying to get in with her primary doctor however there is restrictions with regards to the pandemic. She wants to be referred to nephrology and then to vascular surgery at [facility] I cannot help her with this. I did tell her that her creatinine had normalized via the last lab work I could see done at [facility] in November and her estimated GFR was 53 at that time. By my estimation careful application of contrast dye and preangiogram hydration should be able to get her through the necessary angiography with no complication however she simply does not want to hear this and she is going to put this off until she can get through the [place] system 2/24; 2-week follow-up wound surface area is about the same certainly not worse. She has 100% slough cover we have been using Soyla Dryer, Clatie ([id number]) 3/3; 2-week follow-up. The patient's wound is essentially unchanged. The surface has slough on it no  debridement it certainly does not look perfused. She is worried about the erythema she is having in her foot. She had a cramp in her leg last night but is not complaining of any other type of pain. Is also is concerned about some edema that is not well corrected by the diuretic she is on 3/16; 2-week follow-up. The wound is unchanged in appearance and in size. The patient has 100% nonviable surface. She saw Dr. Bonnee Quin on 07/21/2019 vascular at [facility]. His statement was "chronic limb threatening ischemia". She was actually planned for a right leg arteriogram and intervention today however she states this was postponed and they are supposed to call her this afternoon to reschedule. Her dressing is being changed at [facility] 08/04/2019 upon evaluation today patient has had her evaluation with vascular at [facility]. Subsequently she unfortunately does not have good arterial flow into her  bilateral lower extremities. She actually is doing worse on the left as compared to the right but neither is effectively receiving good blood flow from the heart down into the lower extremity. She tells me that she actually has her dressing changed on a regular basis at [facility]. She is scheduled apparently to have intervention tomorrow in regard to her right lower extremity and then they will proceed from there in regard to the left depend on how things go. 4/14; the patient was admitted to [facility] from 3/26 through 08/07/2019. She underwent an aortogram ultimately underwent a right popliteal orbital atherectomy and angioplasty, a right anterior tibial artery orbital atherectomy and angioplasty and a right dorsalis pedis artery angioplasty. She was kept in hospital for hydration secondary to a mild bump in her creatinine. I see she was back in the vascular clinic complaining of fatigue but this was not felt to be secondary to procedure which was not done under general anesthesia. She was apparently using Medihoney, the nurse at [facility] is changing the dressing 4/21; the patient has been back to see vascular surgery at [facility] yesterday. She had an improvement in her right arterial studies which were initially done on 3/11. Although her ABI initially was 0.83 on 3/11 yesterday at 0.74 there was a fairly marked improvement in her TBI from 0.13-0.52. The patient is not complaining of any pain. We have been placing Iodoflex on the wound under kerlix Coban 4/28; patient arrived in clinic with been using Iodoflex. Still requiring debridement but the wound is measuring smaller 5/5; still using Iodoflex kerlix Coban. Dimensions slightly smaller 5/12 wound is slightly smaller and the surface looks much better. We have been using Iodoflex with kerlix Coban. The patient is not having any pain around the wound She is describing some cramping in her leg at night which seems to be mostly in her upper thigh. She  states its not every night and it does not seem severe. I am unable to pick up any claudication with activity. I looked through care everywhere. I cannot see her actual postoperative noninvasive studies but I can see them referenced in her surgeon's notes. On 4/20 her ABI was 0.74 TBI of 0.52 with biphasic waveforms on the right. On the left an ABI of 0.62 and 0.48 TBI with monophasic and biphasic waveform Electronic Signature(s) Signed: 09/22/2019 7:53:18 AM By: Linton Ham MD Entered By: Linton Ham on 09/21/2019 11:50:55 Peabody, Hortencia ([id number]) -------------------------------------------------------------------------------- Physical Exam Details Patient Name: [name] Date of Service: 09/21/2019 11:00 AM Medical Record Number: [id number] Patient Account Number: [id number] Date of Birth/Sex: Mar 29, 1931 (86 y.o.  F) Treating RN: Cornell Barman Primary Care Provider: Emily Filbert Other Clinician: Referring Provider: Emily Filbert Treating Provider/Extender: Tito Dine in Treatment: 22 Constitutional Respirations regular, non-labored and within target range.. Temperature is normal and within the target range for the patient.Marland Kitchen Respiratory Respiratory effort is easy and symmetric bilaterally. Rate is normal at rest and on room air.. Cardiovascular Pedal pulses absent bilaterally. I could not feel anything on the left side at the dorsalis pedis or posterior tibial. Integumentary (Hair, Skin) No evidence of infection around the wound. Notes Wound exam; the surface of the wound looks very healthy today for the first time. No debridement was required the wound was washed off with gauze and saline. Electronic Signature(s) Signed: 09/22/2019 7:53:18 AM By: Linton Ham MD Entered By: Linton Ham on 09/21/2019 11:55:45 Haye, Serina ([id number]) -------------------------------------------------------------------------------- Physician Orders Details Patient Name:  [name] Date of Service: 09/21/2019 11:00 AM Medical Record Number: [id number] Patient Account Number: [id number] Date of Birth/Sex: [birth year] (86 y.o. F) Treating RN: Cornell Barman Primary Care Provider: Emily Filbert Other Clinician: Referring Provider: Emily Filbert Treating Provider/Extender: Tito Dine in Treatment: 75 Verbal / Phone Orders: No Diagnosis Coding Wound Cleansing Wound #2 Right,Dorsal Foot o Clean wound with Normal Saline. Anesthetic (add to Medication List) Wound #2 Right,Dorsal Foot o Topical Lidocaine 4% cream applied to wound bed prior to debridement (In Clinic Only). Skin Barriers/Peri-Wound Care Wound #2 Right,Dorsal Foot o Moisturizing lotion - on leg Primary Wound Dressing Wound #2 Right,Dorsal Foot o Hydrafera Blue Ready Transfer Secondary Dressing Wound #2 Right,Dorsal Foot o ABD pad Dressing Change Frequency Wound #2 Right,Dorsal Foot o Change Dressing Monday, Wednesday, Friday Follow-up Appointments Wound #2 Right,Dorsal Foot o Return Appointment in 1 week. Edema Control Wound #2 Right,Dorsal Foot o Kerlix and Coban - Right Lower Extremity o Elevate legs to the level of the heart and pump ankles as often as possible [place] to change dressings Electronic Signature(s) Signed: 09/22/2019 7:53:18 AM By: Linton Ham MD Signed: 11/08/2019 12:53:52 PM By: Gretta Cool, BSN, RN, CWS, Kim RN, BSN Entered By: Gretta Cool, BSN, RN, CWS, Kim on 09/21/2019 11:24:56 [name], Evarose ([id number]) -------------------------------------------------------------------------------- Problem List Details Patient Name: [name] Date of Service: 09/21/2019 11:00 AM Medical Record Number: [id number] Patient Account Number: [id number] Date of Birth/Sex: [birth year] (86 y.o. F) Treating RN: Cornell Barman Primary Care Provider: Emily Filbert Other Clinician: Referring Provider: Emily Filbert Treating  Provider/Extender: Tito Dine in Treatment: 22 Active Problems ICD-10 Encounter Code Description Active Date MDM Diagnosis E11.52 Type 2 diabetes mellitus with diabetic peripheral angiopathy with 04/20/2019 No Yes gangrene L97.518 Non-pressure chronic ulcer of other part of right foot with other specified 04/20/2019 No Yes severity E11.42 Type 2 diabetes mellitus with diabetic polyneuropathy 04/20/2019 No Yes E11.621 Type 2 diabetes mellitus with foot ulcer 04/20/2019 No Yes Inactive Problems ICD-10 Code Description Active Date Inactive Date L97.528 Non-pressure chronic ulcer of other part of left foot with other specified 04/20/2019 04/20/2019 severity Resolved Problems Electronic Signature(s) Signed: 09/22/2019 7:53:18 AM By: Linton Ham MD Entered By: Linton Ham on 09/21/2019 11:34:14 Feldt, Salena ([id number]) -------------------------------------------------------------------------------- Progress Note Details Patient Name: [name] Date of Service: 09/21/2019 11:00 AM Medical Record Number: [id number] Patient Account Number: [id number] Date of Birth/Sex: 05/25/1930 (86 y.o. F) Treating RN: Cornell Barman Primary Care Provider: Emily Filbert Other Clinician: Referring Provider: Emily Filbert Treating Provider/Extender: Tito Dine in Treatment: 22 Subjective History of Present Illness (HPI) ADMISSION 04/20/2019  This is an 86 year old woman who lives in the independent part of [place]. She is a type II diabetic on oral agents and insulin with a recent hemoglobin A1c of 6.3 on 10/6. She was initially seen by primary care on 11/12 after dropping a heavy walker on her bilateral dorsal feet. She had blisters on the right and left lateral foot. She was given doxycycline. She was seen the next day in [place] clinic on 11/13 continued on the.C. Bilateral foot x-rays were negative. She was admitted to hospital from 11/17 through 11/27 with acute kidney  injury I did not research this although the patient tells me that at discharge her kidney function had returned to normal. During the course of this hospitalization it was noted she had wounds on her bilateral feet and her peripheral pulses were nonpalpable. She was evaluated by Dr. Delana Meyer. It was recommended that she consider angiography on the right leg and it was unlikely that she would heal the right leg as her ABI here was 0.48 with monophasic waveforms on the left her ABI was 1.19 with biphasic waveforms. One would wonder if the ABI was artificially elevated on the left. In any case Dr. Cydney Ok note accurately describes the patient's current state of mind. She does not want to rush into an intervention and she is here for our review of her wounds. Noteworthy that when she arrived I think her renal insufficiency was felt to be prerenal. Her GFR on arrival was 22 now 36. Past medical history includes type 2 diabetes with peripheral neuropathy, atrial fibrillation, renal insufficiency We did not calculate ABIs in our clinic this was 0.48 on the right and 1.19 on the left as noted above. Monophasic waveforms on the right biphasic on the left 12/16; patient admitted to the clinic last week with trauma critical limb ischemia on the right. Today her wound on the left dorsal foot is healed. She still has the black eschar on the dorsal right foot that we have been using Santyl on. There is apparently some drainage. She arrives in clinic today with more swelling on the right great and the left dorsal surrounding her foot wounds. Some erythema especially on the right. There is some tenderness on the right but not as much as I might expect if this was all infection nevertheless he does have some neuropathy which might be blunting discomfort. She reports no additional trauma. She is at the rehab section of [facility], she lives in the independent part of [place]. She tells me she has some pain in the  right leg at night which wakes her up from sleeping although she manages to get back to sleep. She has about 150 foot exercise tolerance and then gets fatigue in her legs. Again I discussed the issue of angiogram with the patient especially on the right. She just will not agree to this. She states she wants the rehab and then go to [facility] where a list of her specialists. She was on Levaquin last week we did not prescribe this but she is finished. She describes some nausea and loose stools but does not sound dramatically unwell with this Left dorsal foot had some very superficial areas today I think related to swelling. The big problem is on the dorsal right foot black surface eschar with increased swelling. She does not have any pain or tenderness although she has neuropathy. We have been using Santyl. She has PAD and again I think needs an angiogram. 12/30; 2-week follow-up.  The areas on the right dorsal foot. Everything is healed on the left. She is still in the rehab part of [place]. She went to see Dr. Lucky Cowboy on 12/29. He reiterated to her he felt that she needed angiography on the right leg for critical limb ischemia. She seemed to have 2 major concerns that is wanting to see a nephrologist prior to procedure in consultation. She does not currently have a nephrologist. She also had no confidence in the original arterial studies that were done in the hospital and wanted them redone. I do not think Dr. Lucky Cowboy felt that they needed to be redone and wanted to proceed with angiography. At the end of it he simply stated that he would be glad to do the angiogram at any time the patient agreed to proceed I looked briefly at her kidney function which had normalized by the time she left the hospital in late November. I do not know that the patient has any additional concern at this point other than the reduction in GFR associated with her diabetes and normal aging. 1/27; I have not seen this patient in almost  4 weeks. She was apparently isolated in the [facility] facility because of COVID-19. She tells me she was mildly symptomatic but feels better now although she still feels like she is in a "fog" as far as her mental abilities. She states that she is up and walking for short distances with a walker. She would like to go back to her independent apartment. Still using Santyl to the wound The patient is not describing a lot of pain. She does not describe claudication but her activity sounds very limited especially when she was under isolation. She is still reluctant to go forward with the angiogram that was suggested. Currently she wants to recover fully from Covid infection 2/10; 2-week follow-up. Patient arrives with 100% nonviable surface over the wound on her right dorsal foot. I changed her to [facility] from [place] last week in terms of surface the wound is deteriorated although I think the surface area is about the same. She is not describing claudication walking about 50 feet The patient tells me she is trying to get in with her primary doctor however there is restrictions with regards to the pandemic. She wants to be referred to nephrology and then to vascular surgery at [facility] I cannot help her with this. I did tell her that her creatinine had normalized via the last lab work I could see done at [facility] in November and her estimated GFR was 53 at that time. By my estimation careful application of contrast dye and preangiogram hydration should be able to get her through the necessary angiography with no complication however she simply does not want to hear this and she is going to put this off until she can get through the [place] system 2/24; 2-week follow-up wound surface area is about the same certainly not worse. She has 100% slough cover we have been using Santyl 3/3; 2-week follow-up. The patient's wound is essentially unchanged. The surface has slough on it no debridement it certainly does not  look perfused. She is worried about the erythema she is having in her foot. She had a cramp in her leg last night but is not complaining of any other type of pain. Is also is concerned about some edema that is not well corrected by the diuretic she is on 3/16; 2-week follow-up. The wound is unchanged in appearance and in size.  The patient has 100% nonviable surface. She saw Dr. Reginal Lutes, [place] ([id number]) Gari Crown on 07/21/2019 vascular at [facility]. His statement was "chronic limb threatening ischemia". She was actually planned for a right leg arteriogram and intervention today however she states this was postponed and they are supposed to call her this afternoon to reschedule. Her dressing is being changed at [facility] 08/04/2019 upon evaluation today patient has had her evaluation with vascular at [facility]. Subsequently she unfortunately does not have good arterial flow into her bilateral lower extremities. She actually is doing worse on the left as compared to the right but neither is effectively receiving good blood flow from the heart down into the lower extremity. She tells me that she actually has her dressing changed on a regular basis at [facility]. She is scheduled apparently to have intervention tomorrow in regard to her right lower extremity and then they will proceed from there in regard to the left depend on how things go. 4/14; the patient was admitted to [facility] from 3/26 through 08/07/2019. She underwent an aortogram ultimately underwent a right popliteal orbital atherectomy and angioplasty, a right anterior tibial artery orbital atherectomy and angioplasty and a right dorsalis pedis artery angioplasty. She was kept in hospital for hydration secondary to a mild bump in her creatinine. I see she was back in the vascular clinic complaining of fatigue but this was not felt to be secondary to procedure which was not done under general anesthesia. She was apparently using Medihoney, the nurse  at [facility] is changing the dressing 4/21; the patient has been back to see vascular surgery at [facility] yesterday. She had an improvement in her right arterial studies which were initially done on 3/11. Although her ABI initially was 0.83 on 3/11 yesterday at 0.74 there was a fairly marked improvement in her TBI from 0.13-0.52. The patient is not complaining of any pain. We have been placing Iodoflex on the wound under kerlix Coban 4/28; patient arrived in clinic with been using Iodoflex. Still requiring debridement but the wound is measuring smaller 5/5; still using Iodoflex kerlix Coban. Dimensions slightly smaller 5/12 wound is slightly smaller and the surface looks much better. We have been using Iodoflex with kerlix Coban. The patient is not having any pain around the wound She is describing some cramping in her leg at night which seems to be mostly in her upper thigh. She states its not every night and it does not seem severe. I am unable to pick up any claudication with activity. I looked through care everywhere. I cannot see her actual postoperative noninvasive studies but I can see them referenced in her surgeon's notes. On 4/20 her ABI was 0.74 TBI of 0.52 with biphasic waveforms on the right. On the left an ABI of 0.62 and 0.48 TBI with monophasic and biphasic waveform Objective Constitutional Respirations regular, non-labored and within target range.. Temperature is normal and within the target range for the patient.. Vitals Time Taken: 11:07 AM, Height: 69 in, Weight: 195 lbs, BMI: 28.8, Temperature: 98.4 F, Respiratory Rate: 16 breaths/min. Respiratory Respiratory effort is easy and symmetric bilaterally. Rate is normal at rest and on room air.. Cardiovascular Pedal pulses absent bilaterally. I could not feel anything on the left side at the dorsalis pedis or posterior tibial. General Notes: Wound exam; the surface of the wound looks very healthy today for the first time. No  debridement was required the wound was washed off with gauze and saline. Integumentary (Hair, Skin) No evidence  of infection around the wound. Wound #2 status is Open. Original cause of wound was Trauma. The wound is located on the Right,Dorsal Foot. The wound measures 3cm length x 3cm width x 0.2cm depth; 7.069cm^2 area and 1.414cm^3 volume. There is Fat Layer (Subcutaneous Tissue) Exposed exposed. There is a medium amount of serosanguineous drainage noted. The wound margin is thickened. There is medium (34-66%) red granulation within the wound bed. There is a medium (34-66%) amount of necrotic tissue within the wound bed including Adherent Slough. 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 Anesthetic (add to Medication List): Wound #2 Right,Dorsal Foot: Topical Lidocaine 4% cream applied to wound bed prior to debridement (In Clinic Only). Skin Barriers/Peri-Wound Care: Wound #2 Right,Dorsal Foot: Moisturizing lotion - on leg Primary Wound Dressing: Wound #2 Right,Dorsal Foot: Hydrafera Blue Ready Transfer Secondary Dressing: Wound #2 Right,Dorsal Foot: ABD pad Dressing Change Frequency: Wound #2 Right,Dorsal Foot: Change Dressing Monday, Wednesday, Friday Follow-up Appointments: Wound #2 Right,Dorsal Foot: Return Appointment in 1 week. Edema Control: Wound #2 Right,Dorsal Foot: Kerlix and Coban - Right Lower Extremity Elevate legs to the level of the heart and pump ankles as often as possible Home Health: [place] to change dressings 1. I change the primary dressing to [facility] The  Iodoflex is been excellent at getting a better looking wound surface and the dimensions are slightly smaller I would like to see if the [facility] will do a better job 2. With regards to the cramping she complains of in the left leg at night. I told her that she does have evidence of arterial disease on the left as well although I cannot really pick up any evidence of claudication with activity and 1 would think if the cramping was causing cramps in the left leg at night there should be evidence of claudication with likely minimal activity which is not really clear talking to the patient today. Nevertheless she has a a follow-up with vascular surgery I think in June or early July and I have asked this to certainly mention this to them. I will try to remember to talk to her about this next time. Electronic Signature(s) Signed: 09/22/2019 7:53:18 AM By: Linton Ham MD Entered By: Linton Ham on 09/21/2019 11:59:26 Broerman, Montoya ([id number]) -------------------------------------------------------------------------------- SuperBill Details Patient Name: [name] Date of Service: 09/21/2019 Medical Record Number: [id number] Patient Account Number: [id number] Date of Birth/Sex: [birth year] (86 y.o. F) Treating RN: Cornell Barman Primary Care Provider: Emily Filbert Other Clinician: Referring Provider: Emily Filbert Treating Provider/Extender: Tito Dine in Treatment: 22 Diagnosis Coding ICD-10 Codes Code Description E11.52 Type 2 diabetes mellitus with diabetic peripheral angiopathy with gangrene L97.518 Non-pressure chronic ulcer of other part of right foot with other specified severity E11.42 Type 2 diabetes mellitus with diabetic polyneuropathy E11.621 Type 2 diabetes mellitus with foot ulcer Facility Procedures CPT4 Code: 84166063 Description: 99213 - WOUND CARE VISIT-LEV 3 EST PT Modifier: Quantity: 1 Physician Procedures CPT4 Code: 0160109 Description: 99213 - WC  PHYS LEVEL 3 - EST PT Modifier: Quantity: 1 CPT4 Code: Description: ICD-10 Diagnosis Description E11.52 Type 2 diabetes mellitus with diabetic peripheral angiopathy with gangre L97.518 Non-pressure chronic ulcer of other part of  right foot with other specif Modifier: ne ied severity Quantity: Electronic Signature(s) Signed: 09/22/2019 7:53:18 AM By: Linton Ham MD Entered By: Linton Ham on 09/21/2019 12:00:35

## 2019-11-09 ENCOUNTER — Encounter: Payer: Medicare Other | Admitting: Internal Medicine

## 2019-11-09 ENCOUNTER — Other Ambulatory Visit: Payer: Self-pay

## 2019-11-09 DIAGNOSIS — E11621 Type 2 diabetes mellitus with foot ulcer: Secondary | ICD-10-CM | POA: Diagnosis not present

## 2019-11-15 NOTE — Progress Notes (Signed)
[place], [place] ([id number]) Visit Report for 11/09/2019 Arrival Information Details Patient Name: [name], [name] [facility] Date of Service: 11/09/2019 10:45 AM Medical Record Number: [id number] Patient Account Number: [id number] Date of Birth/Sex: [birth year] (86 y.o. F) Treating RN: Cornell Barman Primary Care Kush Farabee: Emily Filbert Other Clinician: Referring Jannely Henthorn: Emily Filbert Treating Leshawn Straka/Extender: Tito Dine in Treatment: 29 Visit Information History Since Last Visit Has Dressing in Place as Prescribed: Yes Patient Arrived: [place] Pain Present Now: No Arrival Time: 10:40 Accompanied By: self Transfer Assistance: None Patient Identification Verified: Yes Secondary Verification Process Completed: Yes Patient Has Alerts: Yes Patient Alerts: Patient on Blood Thinner ABI L 1.19 R 0.48 03/2019 2-325mg  Aspirin daily TBI R .13 L .42 Electronic Signature(s) Signed: 11/09/2019 2:35:46 PM By: Gretta Cool, BSN, RN, CWS, Kim RN, BSN Entered By: Gretta Cool, BSN, RN, CWS, Kim on 11/09/2019 10:41:28 [name], Javon ([id number]) -------------------------------------------------------------------------------- Lower Extremity Assessment Details Patient Name: [name] Date of Service: 11/09/2019 10:45 AM Medical Record Number: [id number] Patient Account Number: [id number] Date of Birth/Sex: Jan 22, 1931 (86 y.o. F) Treating RN: Cornell Barman Primary Care Camil Wilhelmsen: Emily Filbert Other Clinician: Referring Ellouise Mcwhirter: Emily Filbert Treating Kiwanna Spraker/Extender: Tito Dine in Treatment: 29 Edema Assessment Assessed: [Left: No] [Right: Yes] [Left: Edema] [Right: :] Vascular Assessment Pulses: Dorsalis Pedis Palpable: [Right:Yes] Posterior Tibial Palpable: [Right:Yes] Electronic Signature(s) Signed: 11/09/2019 2:35:46 PM By: Gretta Cool, BSN, RN, CWS, Kim RN, BSN Entered By: Gretta Cool, BSN, RN, CWS, Kim on 11/09/2019 10:49:45 [name], Kamylle  [phone]) -------------------------------------------------------------------------------- Multi Wound Chart Details Patient Name: [name] Date of Service: 11/09/2019 10:45 AM Medical Record Number: [id number] Patient Account Number: [id number] Date of Birth/Sex: 1930-11-24 (86 y.o. F) Treating RN: Cornell Barman Primary Care Nyilah Kight: Emily Filbert Other Clinician: Referring Pattijo Juste: Emily Filbert Treating Kc Summerson/Extender: Tito Dine in Treatment: 29 Vital Signs Height(in): 3 Pulse(bpm): 24 Weight(lbs): 195 Blood Pressure(mmHg): 171/70 Body Mass Index(BMI): 29 Temperature(F): 97.7 Respiratory Rate(breaths/min): 16 Photos: [N/A:N/A] Wound Location: Right, Dorsal Foot N/A N/A Wounding Event: Trauma N/A N/A Primary Etiology: Cellulitis N/A N/A Comorbid History: Cataracts, Arrhythmia, Congestive N/A N/A Heart Failure, Hypertension, Type II Diabetes, Rheumatoid Arthritis Date Acquired: 03/24/2019 N/A N/A Weeks of Treatment: 29 N/A N/A Wound Status: Open N/A N/A Measurements L x W x D (cm) 1x0.5x0.1 N/A N/A Area (cm) : 0.393 N/A N/A Volume (cm) : 0.039 N/A N/A % Reduction in Area: 94.00% N/A N/A % Reduction in Volume: 94.10% N/A N/A Classification: Full Thickness Without Exposed N/A N/A Support Structures Exudate Amount: None Present N/A N/A Wound Margin: Thickened N/A N/A Granulation Amount: None Present (0%) N/A N/A Necrotic Amount: Large (67-100%) N/A N/A Necrotic Tissue: Eschar N/A N/A Exposed Structures: Fat Layer (Subcutaneous Tissue) N/A N/A Exposed: Yes Fascia: No Tendon: No Muscle: No Joint: No Bone: No Epithelialization: None N/A N/A Debridement: Debridement - Excisional N/A N/A Pre-procedure Verification/Time 10:50 N/A N/A Out Taken: Tissue Debrided: Subcutaneous N/A N/A Level: Skin/Subcutaneous Tissue N/A N/A Debridement Area (sq cm): 0.5 N/A N/A Instrument: Curette N/A N/A Bleeding: Minimum N/A N/A Hemostasis Achieved: Pressure N/A  N/A Debridement Treatment Procedure was tolerated well N/A N/A Response: Post Debridement 1x0.5x0.1 N/A N/A Measurements L x W x D (cm) 0.039 N/A N/A [name], [name] ([id number]) Post Debridement Volume: (cm) Procedures Performed: Debridement N/A N/A Treatment Notes Electronic Signature(s) Signed: 11/15/2019 7:31:01 AM By: Linton Ham MD Entered By: Linton Ham on 11/09/2019 10:59:41 [name], [name] ([id number]) -------------------------------------------------------------------------------- Multi-Disciplinary Care Plan Details Patient Name: [name] Date of Service: 11/09/2019 10:45 AM Medical Record Number: [id number] Patient Account Number: [id number] Date  of Birth/Sex: 1930-11-13 (86 y.o. F) Treating RN: Cornell Barman Primary Care Tonja Jezewski: Emily Filbert Other Clinician: Referring Sharona Rovner: Emily Filbert Treating Becky Berberian/Extender: Tito Dine in Treatment: 29 Active Inactive Necrotic Tissue Nursing Diagnoses: Impaired tissue integrity related to necrotic/devitalized tissue Knowledge deficit related to management of necrotic/devitalized tissue Goals: Necrotic/devitalized tissue will be minimized in the wound bed Date Initiated: 04/20/2019 Target Resolution Date: 05/04/2019 Goal Status: Active Interventions: Assess patient pain level pre-, during and post procedure and prior to discharge Provide education on necrotic tissue and debridement process Treatment Activities: Apply topical anesthetic as ordered : 04/20/2019 Notes: Venous Leg Ulcer Nursing Diagnoses: Actual venous Insuffiency (use after diagnosis is confirmed) Goals: Patient/caregiver will verbalize understanding of disease process and disease management Date Initiated: 04/20/2019 Target Resolution Date: 05/04/2019 Goal Status: Active Interventions: Assess peripheral edema status every visit. Notes: Wound/Skin Impairment Nursing Diagnoses: Impaired tissue integrity Goals: Patient/caregiver will  verbalize understanding of skin care regimen Date Initiated: 04/20/2019 Target Resolution Date: 05/04/2019 Goal Status: Active Ulcer/skin breakdown will have a volume reduction of 30% by week 4 Date Initiated: 04/20/2019 Target Resolution Date: 05/18/2019 Goal Status: Active Interventions: Assess ulceration(s) every visit Treatment Activities: Skin care regimen initiated : 04/20/2019 Topical wound management initiated : 04/20/2019 [name], [name] ([id number]) Notes: Electronic Signature(s) Signed: 11/09/2019 2:35:46 PM By: Gretta Cool, BSN, RN, CWS, Kim RN, BSN Entered By: Gretta Cool, BSN, RN, CWS, Kim on 11/09/2019 10:51:37 [name], [name] ([id number]) -------------------------------------------------------------------------------- Pain Assessment Details Patient Name: [name] Date of Service: 11/09/2019 10:45 AM Medical Record Number: [id number] Patient Account Number: [id number] Date of Birth/Sex: [birth year] (86 y.o. F) Treating RN: Cornell Barman Primary Care Inaaya Vellucci: Emily Filbert Other Clinician: Referring Kalla Watson: Emily Filbert Treating Everly Rubalcava/Extender: Tito Dine in Treatment: 29 Active Problems Location of Pain Severity and Description of Pain Patient Has Paino No Site Locations Pain Management and Medication Current Pain Management: Electronic Signature(s) Signed: 11/09/2019 2:35:46 PM By: Gretta Cool, BSN, RN, CWS, Kim RN, BSN Entered By: Gretta Cool, BSN, RN, CWS, Kim on 11/09/2019 10:47:02 [name], [name] ([id number]) -------------------------------------------------------------------------------- Wound Assessment Details Patient Name: [name] Date of Service: 11/09/2019 10:45 AM Medical Record Number: [id number] Patient Account Number: [id number] Date of Birth/Sex: Apr 06, 1931 (86 y.o. F) Treating RN: Cornell Barman Primary Care Yamil Oelke: Emily Filbert Other Clinician: Referring Calil Amor: Emily Filbert Treating Tineshia Becraft/Extender: Tito Dine in Treatment: 29 Wound  Status Wound Number: 2 Primary Cellulitis Etiology: Wound Location: Right, Dorsal Foot Wound Open Wounding Event: Trauma Status: Date Acquired: 03/24/2019 Comorbid Cataracts, Arrhythmia, Congestive Heart Failure, Weeks Of Treatment: 29 History: Hypertension, Type II Diabetes, Rheumatoid Arthritis Clustered Wound: No Photos Wound Measurements Length: (cm) 1 Width: (cm) 0.5 Depth: (cm) 0.1 Area: (cm) 0.393 Volume: (cm) 0.039 % Reduction in Area: 94% % Reduction in Volume: 94.1% Epithelialization: None Tunneling: No Undermining: No Wound Description Classification: Full Thickness Without Exposed Support Structures Wound Margin: Thickened Exudate Amount: None Present Foul Odor After Cleansing: No Slough/Fibrino No Wound Bed Granulation Amount: None Present (0%) Exposed Structure Necrotic Amount: Large (67-100%) Fascia Exposed: No Necrotic Quality: Eschar Fat Layer (Subcutaneous Tissue) Exposed: Yes Tendon Exposed: No Muscle Exposed: No Joint Exposed: No Bone Exposed: No Electronic Signature(s) Signed: 11/09/2019 2:35:46 PM By: Gretta Cool, BSN, RN, CWS, Kim RN, BSN Entered By: Gretta Cool, BSN, RN, CWS, Kim on 11/09/2019 10:48:44 [name], [name] ([id number]) -------------------------------------------------------------------------------- [name] Details Patient Name: [name] Date of Service: 11/09/2019 10:45 AM Medical Record Number: [id number] Patient Account Number: [id number] Date of Birth/Sex: 15-Jan-1931 (86 y.o. F) Treating RN: Cornell Barman Primary Care Dieter Hane: Sabra Heck,  Mark Other Clinician: Referring Emma Schupp: Emily Filbert Treating Keymora Grillot/Extender: Tito Dine in Treatment: 29 Vital Signs Time Taken: 10:41 Temperature (F): 97.7 Height (in): 69 Pulse (bpm): 69 Weight (lbs): 195 Respiratory Rate (breaths/min): 16 Body Mass Index (BMI): 28.8 Blood Pressure (mmHg): 171/70 Reference Range: 80 - 120 mg / dl Electronic Signature(s) Signed: 11/09/2019  2:35:46 PM By: Gretta Cool, BSN, RN, CWS, Kim RN, BSN Entered By: Gretta Cool, BSN, RN, CWS, Kim on 11/09/2019 10:44:10

## 2019-11-15 NOTE — Progress Notes (Signed)
[name], [name] ([id number]) Visit Report for 11/09/2019 Debridement Details Patient Name: [name], [name] [facility] Date of Service: 11/09/2019 10:45 AM Medical Record Number: [id number] Patient Account Number: [id number] Date of Birth/Sex: 1930/11/09 (86 y.o. F) Treating RN: Cornell Barman Primary Care Provider: Emily Filbert Other Clinician: Referring Provider: Emily Filbert Treating Provider/Extender: Tito Dine in Treatment: 29 Debridement Performed for Wound #2 Right,Dorsal Foot Assessment: Performed By: Physician Ricard Dillon, MD Debridement Type: Debridement Level of Consciousness (Pre- Awake and Alert procedure): Pre-procedure Verification/Time Out Yes - 10:50 Taken: Total Area Debrided (L x W): 1 (cm) x 0.5 (cm) = 0.5 (cm) Tissue and other material Viable, Non-Viable, Subcutaneous, Skin: Dermis , Fibrin/Exudate debrided: Level: Skin/Subcutaneous Tissue Debridement Description: Excisional Instrument: Curette Bleeding: Minimum Hemostasis Achieved: Pressure Response to Treatment: Procedure was tolerated well Level of Consciousness (Post- Awake and Alert procedure): Post Debridement Measurements of Total Wound Length: (cm) 1 Width: (cm) 0.5 Depth: (cm) 0.1 Volume: (cm) 0.039 Character of Wound/Ulcer Post Debridement: Stable Post Procedure Diagnosis Same as Pre-procedure Electronic Signature(s) Signed: 11/09/2019 2:35:46 PM By: Gretta Cool, BSN, RN, CWS, Kim RN, BSN Signed: 11/15/2019 7:31:01 AM By: Linton Ham MD Entered By: Linton Ham on 11/09/2019 10:59:54 [name], [name] ([id number]) -------------------------------------------------------------------------------- HPI Details Patient Name: [name] Date of Service: 11/09/2019 10:45 AM Medical Record Number: [id number] Patient Account Number: [id number] Date of Birth/Sex: Jan 10, 1931 (86 y.o. F) Treating RN: Cornell Barman Primary Care Provider: Emily Filbert Other Clinician: Referring Provider: Emily Filbert Treating  Provider/Extender: Tito Dine in Treatment: 107 History of Present Illness HPI Description: ADMISSION 04/20/2019 This is an 86 year old woman who lives in the independent part of [place]. She is a type II diabetic on oral agents and insulin with a recent hemoglobin A1c of 6.3 on 10/6. She was initially seen by primary care on 11/12 after dropping a heavy walker on her bilateral dorsal feet. She had blisters on the right and left lateral foot. She was given doxycycline. She was seen the next day in [place] clinic on 11/13 continued on the.C. Bilateral foot x-rays were negative. She was admitted to hospital from 11/17 through 11/27 with acute kidney injury I did not research this although the patient tells me that at discharge her kidney function had returned to normal. During the course of this hospitalization it was noted she had wounds on her bilateral feet and her peripheral pulses were nonpalpable. She was evaluated by Dr. Delana Meyer. It was recommended that she consider angiography on the right leg and it was unlikely that she would heal the right leg as her ABI here was 0.48 with monophasic waveforms on the left her ABI was 1.19 with biphasic waveforms. One would wonder if the ABI was artificially elevated on the left. In any case Dr. Cydney Ok note accurately describes the patient's current state of mind. She does not want to rush into an intervention and she is here for our review of her wounds. Noteworthy that when she arrived I think her renal insufficiency was felt to be prerenal. Her GFR on arrival was 22 now 36. Past medical history includes type 2 diabetes with peripheral neuropathy, atrial fibrillation, renal insufficiency We did not calculate ABIs in our clinic this was 0.48 on the right and 1.19 on the left as noted above. Monophasic waveforms on the right biphasic on the left 12/16; patient admitted to the clinic last week with trauma critical limb ischemia on the  right. Today her wound on the left dorsal foot is healed. She still has the  black eschar on the dorsal right foot that we have been using Santyl on. There is apparently some drainage. She arrives in clinic today with more swelling on the right great and the left dorsal surrounding her foot wounds. Some erythema especially on the right. There is some tenderness on the right but not as much as I might expect if this was all infection nevertheless he does have some neuropathy which might be blunting discomfort. She reports no additional trauma. She is at the rehab section of [place], she lives in the independent part of [place]. She tells me she has some pain in the right leg at night which wakes her up from sleeping although she manages to get back to sleep. She has about 150 foot exercise tolerance and then gets fatigue in her legs. Again I discussed the issue of angiogram with the patient especially on the right. She just will not agree to this. She states she wants the rehab and then go to [facility] where a list of her specialists. She was on Levaquin last week we did not prescribe this but she is finished. She describes some nausea and loose stools but does not sound dramatically unwell with this Left dorsal foot had some very superficial areas today I think related to swelling. The big problem is on the dorsal right foot black surface eschar with increased swelling. She does not have any pain or tenderness although she has neuropathy. We have been using Santyl. She has PAD and again I think needs an angiogram. 12/30; 2-week follow-up. The areas on the right dorsal foot. Everything is healed on the left. She is still in the rehab part of [place]. She went to see Dr. Lucky Cowboy on 12/29. He reiterated to her he felt that she needed angiography on the right leg for critical limb ischemia. She seemed to have 2 major concerns that is wanting to see a nephrologist prior to procedure in consultation. She  does not currently have a nephrologist. She also had no confidence in the original arterial studies that were done in the hospital and wanted them redone. I do not think Dr. Lucky Cowboy felt that they needed to be redone and wanted to proceed with angiography. At the end of it he simply stated that he would be glad to do the angiogram at any time the patient agreed to proceed I looked briefly at her kidney function which had normalized by the time she left the hospital in late November. I do not know that the patient has any additional concern at this point other than the reduction in GFR associated with her diabetes and normal aging. 1/27; I have not seen this patient in almost 4 weeks. She was apparently isolated in the [facility] facility because of COVID-19. She tells me she was mildly symptomatic but feels better now although she still feels like she is in a "fog" as far as her mental abilities. She states that she is up and walking for short distances with a walker. She would like to go back to her independent apartment. Still using Santyl to the wound The patient is not describing a lot of pain. She does not describe claudication but her activity sounds very limited especially when she was under isolation. She is still reluctant to go forward with the angiogram that was suggested. Currently she wants to recover fully from Covid infection 2/10; 2-week follow-up. Patient arrives with 100% nonviable surface over the wound on her right dorsal foot. I  changed her to [facility] from [address] last week in terms of surface the wound is deteriorated although I think the surface area is about the same. She is not describing claudication walking about 50 feet The patient tells me she is trying to get in with her primary doctor however there is restrictions with regards to the pandemic. She wants to be referred to nephrology and then to vascular surgery at [facility] I cannot help her with this. I did tell her that  her creatinine had normalized via the last lab work I could see done at [facility] in November and her estimated GFR was 53 at that time. By my estimation careful application of contrast dye and preangiogram hydration should be able to get her through the necessary angiography with no complication however she simply does not want to hear this and she is going to put this off until she can get through the [place] system 2/24; 2-week follow-up wound surface area is about the same certainly not worse. She has 100% slough cover we have been using Santyl 3/3; 2-week follow-up. The patient's wound is essentially unchanged. The surface has slough on it no debridement it certainly does not look perfused. She is worried about the erythema she is having in her foot. She had a cramp in her leg last night but is not complaining of any other type of pain. Is also is concerned about some edema that is not well corrected by the diuretic she is on 3/16; 2-week follow-up. The wound is unchanged in appearance and in size. The patient has 100% nonviable surface. She saw Dr. Reginal Lutes, [place] ([id number]) Gari Crown on 07/21/2019 vascular at [facility]. His statement was "chronic limb threatening ischemia". She was actually planned for a right leg arteriogram and intervention today however she states this was postponed and they are supposed to call her this afternoon to reschedule. Her dressing is being changed at [facility] 08/04/2019 upon evaluation today patient has had her evaluation with vascular at [facility]. Subsequently she unfortunately does not have good arterial flow into her bilateral lower extremities. She actually is doing worse on the left as compared to the right but neither is effectively receiving good blood flow from the heart down into the lower extremity. She tells me that she actually has her dressing changed on a regular basis at [facility]. She is scheduled apparently to have intervention tomorrow in regard to her  right lower extremity and then they will proceed from there in regard to the left depend on how things go. 4/14; the patient was admitted to [facility] from 3/26 through 08/07/2019. She underwent an aortogram ultimately underwent a right popliteal orbital atherectomy and angioplasty, a right anterior tibial artery orbital atherectomy and angioplasty and a right dorsalis pedis artery angioplasty. She was kept in hospital for hydration secondary to a mild bump in her creatinine. I see she was back in the vascular clinic complaining of fatigue but this was not felt to be secondary to procedure which was not done under general anesthesia. She was apparently using Medihoney, the nurse at [facility] is changing the dressing 4/21; the patient has been back to see vascular surgery at [facility] yesterday. She had an improvement in her right arterial studies which were initially done on 3/11. Although her ABI initially was 0.83 on 3/11 yesterday at 0.74 there was a fairly marked improvement in her TBI from 0.13-0.52. The patient is not complaining of any pain. We have been placing Iodoflex on the wound  under kerlix Coban 4/28; patient arrived in clinic with been using Iodoflex. Still requiring debridement but the wound is measuring smaller 5/5; still using Iodoflex kerlix Coban. Dimensions slightly smaller 5/12 wound is slightly smaller and the surface looks much better. We have been using Iodoflex with kerlix Coban. The patient is not having any pain around the wound She is describing some cramping in her leg at night which seems to be mostly in her upper thigh. She states its not every night and it does not seem severe. I am unable to pick up any claudication with activity. I looked through care everywhere. I cannot see her actual postoperative noninvasive studies but I can see them referenced in her surgeon's notes. On 4/20 her ABI was 0.74 TBI of 0.52 with biphasic waveforms on the right. On the left an ABI of 0.62  and 0.48 TBI with monophasic and biphasic waveform 5/19; the wound continues to make nice progress. Healthy granulation. Changed to [facility] 6/2; continued improvement in surface area. Healthy granulation she does have some eschar around the wound circumference although I do not think this appears to be inhibiting healing. She is using Hydrofera Blue. Dressing being changed at the [facility] facility where she is a resident 6/16; 2-week follow-up. Using Hydrofera Blue. Continued improvement in surface area. 6/30; wound exam; area on the foot continues to get smaller. Eschar and retained Hydrofera Blue over the area. She is having this changed at Electronic Signature(s) Signed: 11/15/2019 7:31:01 AM By: Linton Ham MD Entered By: Linton Ham on 11/09/2019 11:03:51 [name], [name] ([id number]) -------------------------------------------------------------------------------- Physical Exam Details Patient Name: [name] Date of Service: 11/09/2019 10:45 AM Medical Record Number: [id number] Patient Account Number: [id number] Date of Birth/Sex: 1930/09/02 (86 y.o. F) Treating RN: Cornell Barman Primary Care Provider: Emily Filbert Other Clinician: Referring Provider: Emily Filbert Treating Provider/Extender: Tito Dine in Treatment: 75 Constitutional Patient is hypertensive.. Pulse regular and within target range for patient.Marland Kitchen Respirations regular, non-labored and within target range.. Temperature is normal and within the target range for the patient.Marland Kitchen appears in no distress. Notes Wound exam; still considerable eschar and some retained Hydrofera Blue/subcutaneous debris. I used a #5 curette to remove this she still has a small clean wound remaining post debridement. No evidence of surrounding infection Electronic Signature(s) Signed: 11/15/2019 7:31:01 AM By: Linton Ham MD Entered By: Linton Ham on 11/09/2019 11:05:03 [name], [name]  ([id number]) -------------------------------------------------------------------------------- Physician Orders Details Patient Name: [name] Date of Service: 11/09/2019 10:45 AM Medical Record Number: [id number] Patient Account Number: [id number] Date of Birth/Sex: 08-Aug-1930 (86 y.o. F) Treating RN: Cornell Barman Primary Care Provider: Emily Filbert Other Clinician: Referring Provider: Emily Filbert Treating Provider/Extender: Tito Dine in Treatment: 87 Verbal / Phone Orders: No Diagnosis Coding Wound Cleansing Wound #2 Right,Dorsal Foot o Clean wound with Normal Saline. Anesthetic (add to Medication List) Wound #2 Right,Dorsal Foot o Topical Lidocaine 4% cream applied to wound bed prior to debridement (In Clinic Only). Skin Barriers/Peri-Wound Care Wound #2 Right,Dorsal Foot o Moisturizing lotion - on leg Primary Wound Dressing Wound #2 Right,Dorsal Foot o Hydrafera Blue Ready Transfer Secondary Dressing Wound #2 Right,Dorsal Foot o ABD pad Dressing Change Frequency Wound #2 Right,Dorsal Foot o Change Dressing Monday, Wednesday, Friday Follow-up Appointments Wound #2 Right,Dorsal Foot o Return Appointment in 2 weeks. Edema Control Wound #2 Right,Dorsal Foot o Kerlix and Coban - Right Lower Extremity o Elevate legs to the level of the heart and pump ankles as often as possible Home Health   o [place] to change dressings Electronic Signature(s) Signed: 11/09/2019 2:35:46 PM By: Gretta Cool, BSN, RN, CWS, Kim RN, BSN Signed: 11/15/2019 7:31:01 AM By: Linton Ham MD Entered By: Gretta Cool, BSN, RN, CWS, Kim on 11/09/2019 10:55:24 [name], [name] ([id number]) -------------------------------------------------------------------------------- Problem List Details Patient Name: [name] Date of Service: 11/09/2019 10:45 AM Medical Record Number: [id number] Patient Account Number: [id number] Date of Birth/Sex: 01-06-1931 (86  y.o. F) Treating RN: Cornell Barman Primary Care Provider: Emily Filbert Other Clinician: Referring Provider: Emily Filbert Treating Provider/Extender: Tito Dine in Treatment: 29 Active Problems ICD-10 Encounter Code Description Active Date MDM Diagnosis E11.52 Type 2 diabetes mellitus with diabetic peripheral angiopathy with 04/20/2019 No Yes gangrene L97.518 Non-pressure chronic ulcer of other part of right foot with other specified 04/20/2019 No Yes severity E11.42 Type 2 diabetes mellitus with diabetic polyneuropathy 04/20/2019 No Yes E11.621 Type 2 diabetes mellitus with foot ulcer 04/20/2019 No Yes Inactive Problems ICD-10 Code Description Active Date Inactive Date L97.528 Non-pressure chronic ulcer of other part of left foot with other specified 04/20/2019 04/20/2019 severity Resolved Problems Electronic Signature(s) Signed: 11/15/2019 7:31:01 AM By: Linton Ham MD Entered By: Linton Ham on 11/09/2019 10:59:28 [name], [name] ([id number]) -------------------------------------------------------------------------------- Progress Note Details Patient Name: [name] Date of Service: 11/09/2019 10:45 AM Medical Record Number: [id number] Patient Account Number: [id number] Date of Birth/Sex: 18-Aug-1930 (86 y.o. F) Treating RN: Cornell Barman Primary Care Provider: Emily Filbert Other Clinician: Referring Provider: Emily Filbert Treating Provider/Extender: Tito Dine in Treatment: 29 Subjective History of Present Illness (HPI) ADMISSION 04/20/2019 This is an 86 year old woman who lives in the independent part of [place]. She is a type II diabetic on oral agents and insulin with a recent hemoglobin A1c of 6.3 on 10/6. She was initially seen by primary care on 11/12 after dropping a heavy walker on her bilateral dorsal feet. She had blisters on the right and left lateral foot. She was given doxycycline. She was seen the next day in [place] clinic on 11/13  continued on the.C. Bilateral foot x-rays were negative. She was admitted to hospital from 11/17 through 11/27 with acute kidney injury I did not research this although the patient tells me that at discharge her kidney function had returned to normal. During the course of this hospitalization it was noted she had wounds on her bilateral feet and her peripheral pulses were nonpalpable. She was evaluated by Dr. Delana Meyer. It was recommended that she consider angiography on the right leg and it was unlikely that she would heal the right leg as her ABI here was 0.48 with monophasic waveforms on the left her ABI was 1.19 with biphasic waveforms. One would wonder if the ABI was artificially elevated on the left. In any case Dr. Cydney Ok note accurately describes the patient's current state of mind. She does not want to rush into an intervention and she is here for our review of her wounds. Noteworthy that when she arrived I think her renal insufficiency was felt to be prerenal. Her GFR on arrival was 22 now 36. Past medical history includes type 2 diabetes with peripheral neuropathy, atrial fibrillation, renal insufficiency We did not calculate ABIs in our clinic this was 0.48 on the right and 1.19 on the left as noted above. Monophasic waveforms on the right biphasic on the left 12/16; patient admitted to the clinic last week with trauma critical limb ischemia on the right. Today her wound on the left dorsal foot is healed. She  still has the black eschar on the dorsal right foot that we have been using Santyl on. There is apparently some drainage. She arrives in clinic today with more swelling on the right great and the left dorsal surrounding her foot wounds. Some erythema especially on the right. There is some tenderness on the right but not as much as I might expect if this was all infection nevertheless he does have some neuropathy which might be blunting discomfort. She reports no additional trauma.  She is at the rehab section of [facility], she lives in the independent part of [place]. She tells me she has some pain in the right leg at night which wakes her up from sleeping although she manages to get back to sleep. She has about 150 foot exercise tolerance and then gets fatigue in her legs. Again I discussed the issue of angiogram with the patient especially on the right. She just will not agree to this. She states she wants the rehab and then go to [facility] where a list of her specialists. She was on Levaquin last week we did not prescribe this but she is finished. She describes some nausea and loose stools but does not sound dramatically unwell with this Left dorsal foot had some very superficial areas today I think related to swelling. The big problem is on the dorsal right foot black surface eschar with increased swelling. She does not have any pain or tenderness although she has neuropathy. We have been using Santyl. She has PAD and again I think needs an angiogram. 12/30; 2-week follow-up. The areas on the right dorsal foot. Everything is healed on the left. She is still in the rehab part of [place]. She went to see Dr. Lucky Cowboy on 12/29. He reiterated to her he felt that she needed angiography on the right leg for critical limb ischemia. She seemed to have 2 major concerns that is wanting to see a nephrologist prior to procedure in consultation. She does not currently have a nephrologist. She also had no confidence in the original arterial studies that were done in the hospital and wanted them redone. I do not think Dr. Lucky Cowboy felt that they needed to be redone and wanted to proceed with angiography. At the end of it he simply stated that he would be glad to do the angiogram at any time the patient agreed to proceed I looked briefly at her kidney function which had normalized by the time she left the hospital in late November. I do not know that the patient has any additional concern at this  point other than the reduction in GFR associated with her diabetes and normal aging. 1/27; I have not seen this patient in almost 4 weeks. She was apparently isolated in the [facility] facility because of COVID-19. She tells me she was mildly symptomatic but feels better now although she still feels like she is in a "fog" as far as her mental abilities. She states that she is up and walking for short distances with a walker. She would like to go back to her independent apartment. Still using Santyl to the wound The patient is not describing a lot of pain. She does not describe claudication but her activity sounds very limited especially when she was under isolation. She is still reluctant to go forward with the angiogram that was suggested. Currently she wants to recover fully from Covid infection 2/10; 2-week follow-up. Patient arrives with 100% nonviable surface over the wound on her  right dorsal foot. I changed her to [facility] from [place] last week in terms of surface the wound is deteriorated although I think the surface area is about the same. She is not describing claudication walking about 50 feet The patient tells me she is trying to get in with her primary doctor however there is restrictions with regards to the pandemic. She wants to be referred to nephrology and then to vascular surgery at [facility] I cannot help her with this. I did tell her that her creatinine had normalized via the last lab work I could see done at [facility] in November and her estimated GFR was 53 at that time. By my estimation careful application of contrast dye and preangiogram hydration should be able to get her through the necessary angiography with no complication however she simply does not want to hear this and she is going to put this off until she can get through the [place] system 2/24; 2-week follow-up wound surface area is about the same certainly not worse. She has 100% slough cover we have been using Santyl 3/3;  2-week follow-up. The patient's wound is essentially unchanged. The surface has slough on it no debridement it certainly does not look perfused. She is worried about the erythema she is having in her foot. She had a cramp in her leg last night but is not complaining of any other type of pain. Is also is concerned about some edema that is not well corrected by the diuretic she is on 3/16; 2-week follow-up. The wound is unchanged in appearance and in size. The patient has 100% nonviable surface. She saw Dr. Reginal Lutes, [place] ([id number]) Gari Crown on 07/21/2019 vascular at [facility]. His statement was "chronic limb threatening ischemia". She was actually planned for a right leg arteriogram and intervention today however she states this was postponed and they are supposed to call her this afternoon to reschedule. Her dressing is being changed at [facility] 08/04/2019 upon evaluation today patient has had her evaluation with vascular at [facility]. Subsequently she unfortunately does not have good arterial flow into her bilateral lower extremities. She actually is doing worse on the left as compared to the right but neither is effectively receiving good blood flow from the heart down into the lower extremity. She tells me that she actually has her dressing changed on a regular basis at [facility]. She is scheduled apparently to have intervention tomorrow in regard to her right lower extremity and then they will proceed from there in regard to the left depend on how things go. 4/14; the patient was admitted to [facility] from 3/26 through 08/07/2019. She underwent an aortogram ultimately underwent a right popliteal orbital atherectomy and angioplasty, a right anterior tibial artery orbital atherectomy and angioplasty and a right dorsalis pedis artery angioplasty. She was kept in hospital for hydration secondary to a mild bump in her creatinine. I see she was back in the vascular clinic complaining of fatigue but this was  not felt to be secondary to procedure which was not done under general anesthesia. She was apparently using Medihoney, the nurse at [facility] is changing the dressing 4/21; the patient has been back to see vascular surgery at [facility] yesterday. She had an improvement in her right arterial studies which were initially done on 3/11. Although her ABI initially was 0.83 on 3/11 yesterday at 0.74 there was a fairly marked improvement in her TBI from 0.13-0.52. The patient is not complaining of any pain. We have been placing  Iodoflex on the wound under kerlix Coban 4/28; patient arrived in clinic with been using Iodoflex. Still requiring debridement but the wound is measuring smaller 5/5; still using Iodoflex kerlix Coban. Dimensions slightly smaller 5/12 wound is slightly smaller and the surface looks much better. We have been using Iodoflex with kerlix Coban. The patient is not having any pain around the wound She is describing some cramping in her leg at night which seems to be mostly in her upper thigh. She states its not every night and it does not seem severe. I am unable to pick up any claudication with activity. I looked through care everywhere. I cannot see her actual postoperative noninvasive studies but I can see them referenced in her surgeon's notes. On 4/20 her ABI was 0.74 TBI of 0.52 with biphasic waveforms on the right. On the left an ABI of 0.62 and 0.48 TBI with monophasic and biphasic waveform 5/19; the wound continues to make nice progress. Healthy granulation. Changed to [facility] 6/2; continued improvement in surface area. Healthy granulation she does have some eschar around the wound circumference although I do not think this appears to be inhibiting healing. She is using Hydrofera Blue. Dressing being changed at the [facility] facility where she is a resident 6/16; 2-week follow-up. Using Hydrofera Blue. Continued improvement in surface area. 6/30; wound exam; area on the foot  continues to get smaller. Eschar and retained Hydrofera Blue over the area. She is having this changed at [facility] Patient is hypertensive.. Pulse regular and within target range for patient.Marland Kitchen Respirations regular, non-labored and within target range.. Temperature is normal and within the target range for the patient.Marland Kitchen appears in no distress. Vitals Time Taken: 10:41 AM, Height: 69 in, Weight: 195 lbs, BMI: 28.8, Temperature: 97.7 F, Pulse: 69 bpm, Respiratory Rate: 16 breaths/min, Blood Pressure: 171/70 mmHg. General Notes: Wound exam; still considerable eschar and some retained Hydrofera Blue/subcutaneous debris. I used a #5 curette to remove this she still has a small clean wound remaining post debridement. No evidence of surrounding infection Integumentary (Hair, Skin) Wound #2 status is Open. Original cause of wound was Trauma. The wound is located on the Right,Dorsal Foot. The wound measures 1cm length x 0.5cm width x 0.1cm depth; 0.393cm^2 area and 0.039cm^3 volume. There is Fat Layer (Subcutaneous Tissue) Exposed exposed. There is no tunneling or undermining noted. There is a none present amount of drainage noted. The wound margin is thickened. There is no granulation within the wound bed. There is a large (67-100%) amount of necrotic tissue within the wound bed including Eschar. 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 There was a Excisional Skin/Subcutaneous Tissue Debridement with a total area of 0.5 sq cm performed by Ricard Dillon, MD. With the following instrument(s): Curette to  remove Viable and Non-Viable tissue/material. Material removed includes Subcutaneous Tissue, Skin: Dermis, and Fibrin/Exudate. A time out was conducted at 10:50, prior to the start of the procedure. A Minimum amount of bleeding was controlled with Pressure. The procedure was tolerated well. Post Debridement Measurements: 1cm length x 0.5cm width x 0.1cm depth; 0.039cm^3 volume. Character of Wound/Ulcer Post Debridement is stable. Post procedure Diagnosis Wound #2: Same as  Pre-Procedure Plan Wound Cleansing: Wound #2 Right,Dorsal Foot: Clean wound with Normal Saline. Anesthetic (add to Medication List): Wound #2 Right,Dorsal Foot: Topical Lidocaine 4% cream applied to wound bed prior to debridement (In Clinic Only). Skin Barriers/Peri-Wound Care: Wound #2 Right,Dorsal Foot: Moisturizing lotion - on leg Primary Wound Dressing: Wound #2 Right,Dorsal Foot: Hydrafera Blue Ready Transfer Secondary Dressing: Wound #2 Right,Dorsal Foot: ABD pad Dressing Change Frequency: Wound #2 Right,Dorsal Foot: Change Dressing Monday, Wednesday, Friday Follow-up Appointments: Wound #2 Right,Dorsal Foot: Return Appointment in 2 weeks. Edema Control: Wound #2 Right,Dorsal Foot: Kerlix and Coban - Right Lower Extremity Elevate legs to the level of the heart and pump ankles as often as possible Home Health: [place] to change dressings #1 I am continue with Hydrofera Blue 2. This should be changed 3 times a week 3. Follow-up in 2 weeks may be healed at that point Electronic Signature(s) Signed: 11/15/2019 7:31:01 AM By: Linton Ham MD Entered By: Linton Ham on 11/09/2019 11:05:53 [name], [name] ([id number]) -------------------------------------------------------------------------------- SuperBill Details Patient Name: [name] Date of Service: 11/09/2019 Medical Record Number: [id number] Patient Account Number: [id number] Date of Birth/Sex: 07/10/1930 (86  y.o. F) Treating RN: Cornell Barman Primary Care Provider: Emily Filbert Other Clinician: Referring Provider: Emily Filbert Treating Provider/Extender: Tito Dine in Treatment: 29 Diagnosis Coding ICD-10 Codes Code Description E11.52 Type 2 diabetes mellitus with diabetic peripheral angiopathy with gangrene L97.518 Non-pressure chronic ulcer of other part of right foot with other specified severity E11.42 Type 2 diabetes mellitus with diabetic polyneuropathy E11.621 Type 2 diabetes mellitus with foot ulcer Facility Procedures CPT4 Code: 16384536 Description: 46803 - DEB SUBQ TISSUE 20 SQ CM/< Modifier: Quantity: 1 CPT4 Code: Description: ICD-10 Diagnosis Description L97.518 Non-pressure chronic ulcer of other part of right foot with other specifi E11.52 Type 2 diabetes mellitus with diabetic peripheral angiopathy with gangren Modifier: ed severity e Quantity: Physician Procedures CPT4 Code: 2122482 Description: 50037 - WC PHYS SUBQ TISS 20 SQ CM Modifier: Quantity: 1 CPT4 Code: Description: ICD-10 Diagnosis Description L97.518 Non-pressure chronic ulcer of other part of right foot with other specifi E11.52 Type 2 diabetes mellitus with diabetic peripheral angiopathy with gangren Modifier: ed severity e Quantity: Electronic Signature(s) Signed: 11/15/2019 7:31:01 AM By: Linton Ham MD Entered By: Linton Ham on 11/09/2019 11:06:42

## 2019-11-23 ENCOUNTER — Encounter: Payer: Medicare Other | Attending: Physician Assistant | Admitting: Internal Medicine

## 2019-11-23 ENCOUNTER — Other Ambulatory Visit: Payer: Self-pay

## 2019-11-23 DIAGNOSIS — E11621 Type 2 diabetes mellitus with foot ulcer: Secondary | ICD-10-CM | POA: Insufficient documentation

## 2019-11-23 DIAGNOSIS — L97518 Non-pressure chronic ulcer of other part of right foot with other specified severity: Secondary | ICD-10-CM | POA: Insufficient documentation

## 2019-11-23 DIAGNOSIS — I509 Heart failure, unspecified: Secondary | ICD-10-CM | POA: Diagnosis not present

## 2019-11-23 DIAGNOSIS — I4891 Unspecified atrial fibrillation: Secondary | ICD-10-CM | POA: Diagnosis not present

## 2019-11-23 DIAGNOSIS — Z8616 Personal history of COVID-19: Secondary | ICD-10-CM | POA: Diagnosis not present

## 2019-11-23 DIAGNOSIS — L97528 Non-pressure chronic ulcer of other part of left foot with other specified severity: Secondary | ICD-10-CM | POA: Diagnosis not present

## 2019-11-23 DIAGNOSIS — E1142 Type 2 diabetes mellitus with diabetic polyneuropathy: Secondary | ICD-10-CM | POA: Insufficient documentation

## 2019-11-23 DIAGNOSIS — I110 Hypertensive heart disease with heart failure: Secondary | ICD-10-CM | POA: Insufficient documentation

## 2019-11-23 DIAGNOSIS — E1152 Type 2 diabetes mellitus with diabetic peripheral angiopathy with gangrene: Secondary | ICD-10-CM | POA: Diagnosis not present

## 2019-11-24 NOTE — Progress Notes (Signed)
[place], [place] ([id number]) Visit Report for 11/23/2019 Arrival Information Details Patient Name: [name], [name] [facility] Date of Service: 11/23/2019 11:00 AM Medical Record Number: [id number] Patient Account Number: [id number] Date of Birth/Sex: 10/10/1930 (86 y.o. F) Treating RN: Army Melia Primary Care Maliki Gignac: Emily Filbert Other Clinician: Referring Nyjai Graff: Emily Filbert Treating Tierre Netto/Extender: Tito Dine in Treatment: 87 Visit Information History Since Last Visit Added or deleted any medications: No Patient Arrived: [place] Any new allergies or adverse reactions: No Arrival Time: 11:06 Had a fall or experienced change in No Accompanied By: self activities of daily living that may affect Transfer Assistance: None risk of falls: Patient Identification Verified: Yes Signs or symptoms of abuse/neglect since last visito No Patient Has Alerts: Yes Hospitalized since last visit: No Patient Alerts: Patient on Blood Thinner Has Dressing in Place as Prescribed: Yes ABI L 1.19 R 0.48 03/2019 Pain Present Now: No 2-325mg  Aspirin daily TBI R .13 L .42 Electronic Signature(s) Signed: 11/23/2019 4:08:07 PM By: Army Melia Entered By: Army Melia on 11/23/2019 11:07:00 [place], [place] ([id number]) -------------------------------------------------------------------------------- Encounter Discharge Information Details Patient Name: [name] Date of Service: 11/23/2019 11:00 AM Medical Record Number: [id number] Patient Account Number: [id number] Date of Birth/Sex: Oct 31, 1930 (86 y.o. F) Treating RN: Cornell Barman Primary Care Kashae Carstens: Emily Filbert Other Clinician: Referring Tifanie Gardiner: Emily Filbert Treating Melanie Pellot/Extender: Tito Dine in Treatment: 59 Encounter Discharge Information Items Post Procedure Vitals Discharge Condition: Stable Unable to obtain vitals Reason: . Ambulatory Status: Ambulatory Discharge Destination: Home Transportation: Private  Auto Accompanied By: self Schedule Follow-up Appointment: Yes Clinical Summary of Care: Electronic Signature(s) Signed: 11/23/2019 12:58:54 PM By: Gretta Cool, BSN, RN, CWS, Kim RN, BSN Entered By: Gretta Cool, BSN, RN, CWS, Kim on 11/23/2019 12:58:54 [name], [place] ([id number]) -------------------------------------------------------------------------------- Lower Extremity Assessment Details Patient Name: [name] Date of Service: 11/23/2019 11:00 AM Medical Record Number: [id number] Patient Account Number: [id number] Date of Birth/Sex: 1930/06/25 (86 y.o. F) Treating RN: Army Melia Primary Care Matrice Herro: Emily Filbert Other Clinician: Referring Kelin Borum: Emily Filbert Treating Aurel Nguyen/Extender: Ricard Dillon Weeks in Treatment: 31 Edema Assessment Assessed: [Left: No] [Right: No] Edema: [Left: N] [Right: o] Vascular Assessment Pulses: Dorsalis Pedis Palpable: [Right:Yes] Electronic Signature(s) Signed: 11/23/2019 4:08:07 PM By: Army Melia Entered By: Army Melia on 11/23/2019 11:11:40 [name], [name] ([id number]) -------------------------------------------------------------------------------- Multi Wound Chart Details Patient Name: [name] Date of Service: 11/23/2019 11:00 AM Medical Record Number: [id number] Patient Account Number: [id number] Date of Birth/Sex: June 28, 1930 (86 y.o. F) Treating RN: Cornell Barman Primary Care Chayla Shands: Emily Filbert Other Clinician: Referring Hendrix Console: Emily Filbert Treating Sister Carbone/Extender: Tito Dine in Treatment: 31 Vital Signs Height(in): 78 Pulse(bpm): 66 Weight(lbs): 195 Blood Pressure(mmHg): 162/72 Body Mass Index(BMI): 29 Temperature(F): 98.4 Respiratory Rate(breaths/min): 16 Photos: [N/A:N/A] Wound Location: Right, Dorsal Foot N/A N/A Wounding Event: Trauma N/A N/A Primary Etiology: Cellulitis N/A N/A Comorbid History: Cataracts, Arrhythmia, Congestive N/A N/A Heart Failure, Hypertension, Type II Diabetes, Rheumatoid  Arthritis Date Acquired: 03/24/2019 N/A N/A Weeks of Treatment: 31 N/A N/A Wound Status: Open N/A N/A Measurements L x W x D (cm) 1x0.5x0.1 N/A N/A Area (cm) : 0.393 N/A N/A Volume (cm) : 0.039 N/A N/A % Reduction in Area: 94.00% N/A N/A % Reduction in Volume: 94.10% N/A N/A Classification: Full Thickness Without Exposed N/A N/A Support Structures Exudate Amount: None Present N/A N/A Wound Margin: Thickened N/A N/A Granulation Amount: None Present (0%) N/A N/A Necrotic Amount: Large (67-100%) N/A N/A Necrotic Tissue: Eschar N/A N/A Exposed Structures: Fat Layer (Subcutaneous Tissue) N/A N/A Exposed: Yes Fascia:  No Tendon: No Muscle: No Joint: No Bone: No Epithelialization: Medium (34-66%) N/A N/A Debridement: Debridement - Excisional N/A N/A Pre-procedure Verification/Time 11:14 N/A N/A Out Taken: Pain Control: Lidocaine N/A N/A Tissue Debrided: Necrotic/Eschar, Subcutaneous N/A N/A Level: Skin/Subcutaneous Tissue N/A N/A Debridement Area (sq cm): 0.5 N/A N/A Instrument: Curette N/A N/A Bleeding: Minimum N/A N/A Hemostasis Achieved: Pressure N/A N/A Debridement Treatment Procedure was tolerated well N/A N/A Response: Post Debridement 1x0.5x0.2 N/A N/A Measurements L x W x D (cm) [name], [name] ([id number]) Post Debridement Volume: 0.079 N/A N/A (cm) Procedures Performed: Debridement N/A N/A Treatment Notes Electronic Signature(s) Signed: 11/23/2019 4:21:24 PM By: Linton Ham MD Entered By: Linton Ham on 11/23/2019 12:17:31 Meding, Gayle ([id number]) -------------------------------------------------------------------------------- Multi-Disciplinary Care Plan Details Patient Name: [name] Date of Service: 11/23/2019 11:00 AM Medical Record Number: [id number] Patient Account Number: [id number] Date of Birth/Sex: 03/10/1931 (86 y.o. F) Treating RN: Cornell Barman Primary Care Lillian Ballester: Emily Filbert Other Clinician: Referring Eran Mistry: Emily Filbert Treating  Delsin Copen/Extender: Tito Dine in Treatment: 31 Active Inactive Necrotic Tissue Nursing Diagnoses: Impaired tissue integrity related to necrotic/devitalized tissue Knowledge deficit related to management of necrotic/devitalized tissue Goals: Necrotic/devitalized tissue will be minimized in the wound bed Date Initiated: 04/20/2019 Target Resolution Date: 05/04/2019 Goal Status: Active Interventions: Assess patient pain level pre-, during and post procedure and prior to discharge Provide education on necrotic tissue and debridement process Treatment Activities: Apply topical anesthetic as ordered : 04/20/2019 Notes: Venous Leg Ulcer Nursing Diagnoses: Actual venous Insuffiency (use after diagnosis is confirmed) Goals: Patient/caregiver will verbalize understanding of disease process and disease management Date Initiated: 04/20/2019 Target Resolution Date: 05/04/2019 Goal Status: Active Interventions: Assess peripheral edema status every visit. Notes: Wound/Skin Impairment Nursing Diagnoses: Impaired tissue integrity Goals: Patient/caregiver will verbalize understanding of skin care regimen Date Initiated: 04/20/2019 Target Resolution Date: 05/04/2019 Goal Status: Active Ulcer/skin breakdown will have a volume reduction of 30% by week 4 Date Initiated: 04/20/2019 Target Resolution Date: 05/18/2019 Goal Status: Active Interventions: Assess ulceration(s) every visit Treatment Activities: Skin care regimen initiated : 04/20/2019 Topical wound management initiated : 04/20/2019 [name], [place] ([id number]) Notes: Electronic Signature(s) Signed: 11/23/2019 6:25:04 PM By: Gretta Cool, BSN, RN, CWS, Kim RN, BSN Entered By: Gretta Cool, BSN, RN, CWS, Kim on 11/23/2019 11:19:21 [name], [place] ([id number]) -------------------------------------------------------------------------------- Pain Assessment Details Patient Name: [name] Date of Service: 11/23/2019 11:00 AM Medical Record  Number: [id number] Patient Account Number: [id number] Date of Birth/Sex: 1930/07/11 (86 y.o. F) Treating RN: Army Melia Primary Care Syre Knerr: Emily Filbert Other Clinician: Referring Keitra Carusone: Emily Filbert Treating Marcie Shearon/Extender: Tito Dine in Treatment: 31 Active Problems Location of Pain Severity and Description of Pain Patient Has Paino No Site Locations Pain Management and Medication Current Pain Management: Electronic Signature(s) Signed: 11/23/2019 4:08:07 PM By: Army Melia Entered By: Army Melia on 11/23/2019 11:07:55 Bent, [place] ([id number]) -------------------------------------------------------------------------------- Wound Assessment Details Patient Name: [name] Date of Service: 11/23/2019 11:00 AM Medical Record Number: [id number] Patient Account Number: [id number] Date of Birth/Sex: 1930/11/05 (86 y.o. F) Treating RN: Army Melia Primary Care Kaliann Coryell: Emily Filbert Other Clinician: Referring Jobani Sabado: Emily Filbert Treating Jezabella Schriever/Extender: Tito Dine in Treatment: 31 Wound Status Wound Number: 2 Primary Cellulitis Etiology: Wound Location: Right, Dorsal Foot Wound Open Wounding Event: Trauma Status: Date Acquired: 03/24/2019 Comorbid Cataracts, Arrhythmia, Congestive Heart Failure, Weeks Of Treatment: 31 History: Hypertension, Type II Diabetes, Rheumatoid Arthritis Clustered Wound: No Photos Wound Measurements Length: (cm) 1 Width: (cm) 0.5 Depth: (cm) 0.1 Area: (cm) 0.393 Volume: (cm) 0.039 % Reduction  in Area: 94% % Reduction in Volume: 94.1% Epithelialization: Medium (34-66%) Tunneling: No Undermining: No Wound Description Classification: Full Thickness Without Exposed Support Structures Wound Margin: Thickened Exudate Amount: None Present Foul Odor After Cleansing: No Slough/Fibrino No Wound Bed Granulation Amount: None Present (0%) Exposed Structure Necrotic Amount: Large (67-100%) Fascia Exposed:  No Necrotic Quality: Eschar Fat Layer (Subcutaneous Tissue) Exposed: Yes Tendon Exposed: No Muscle Exposed: No Joint Exposed: No Bone Exposed: No Treatment Notes Wound #2 (Right, Dorsal Foot) Notes Polymen Ag, kerlix and coban toes to knee Electronic Signature(s) Signed: 11/23/2019 4:08:07 PM By: Army Melia Entered By: Army Melia on 11/23/2019 11:10:05 Wisser, Shirelle ([id number]) Kiel, Lavanya ([id number]) -------------------------------------------------------------------------------- Vitals Details Patient Name: [name] Date of Service: 11/23/2019 11:00 AM Medical Record Number: [id number] Patient Account Number: [id number] Date of Birth/Sex: 03-22-1931 (86 y.o. F) Treating RN: Army Melia Primary Care Yi Haugan: Emily Filbert Other Clinician: Referring Gurfateh Mcclain: Emily Filbert Treating Amyra Vantuyl/Extender: Tito Dine in Treatment: 31 Vital Signs Time Taken: 11:07 Temperature (F): 98.4 Height (in): 69 Pulse (bpm): 53 Weight (lbs): 195 Respiratory Rate (breaths/min): 16 Body Mass Index (BMI): 28.8 Blood Pressure (mmHg): 162/72 Reference Range: 80 - 120 mg / dl Electronic Signature(s) Signed: 11/23/2019 4:08:07 PM By: Army Melia Entered By: Army Melia on 11/23/2019 11:07:46

## 2019-11-24 NOTE — Progress Notes (Signed)
Grainola, Cionna ([id number]) Visit Report for 11/23/2019 Debridement Details Patient Name: [name], [name] Date of Service: 11/23/2019 11:00 AM Medical Record Number: [id number] Patient Account Number: [id number] Date of Birth/Sex: [birth year] (86 y.o. F) Treating RN: Cornell Barman Primary Care Provider: Emily Filbert Other Clinician: Referring Provider: Emily Filbert Treating Provider/Extender: Tito Dine in Treatment: 31 Debridement Performed for Wound #2 Right,Dorsal Foot Assessment: Performed By: Physician Ricard Dillon, MD Debridement Type: Debridement Level of Consciousness (Pre- Awake and Alert procedure): Pre-procedure Verification/Time Out Yes - 11:14 Taken: Start Time: 11:14 Pain Control: Lidocaine Total Area Debrided (L x W): 1 (cm) x 0.5 (cm) = 0.5 (cm) Tissue and other material Eschar, Subcutaneous debrided: Level: Skin/Subcutaneous Tissue Debridement Description: Excisional Instrument: Curette Bleeding: Minimum Hemostasis Achieved: Pressure Response to Treatment: Procedure was tolerated well Level of Consciousness (Post- Awake and Alert procedure): Post Debridement Measurements of Total Wound Length: (cm) 1 Width: (cm) 0.5 Depth: (cm) 0.2 Volume: (cm) 0.079 Character of Wound/Ulcer Post Debridement: Stable Post Procedure Diagnosis Same as Pre-procedure Electronic Signature(s) Signed: 11/23/2019 4:21:24 PM By: Linton Ham MD Signed: 11/23/2019 6:25:04 PM By: Gretta Cool, BSN, RN, CWS, Kim RN, BSN Entered By: Linton Ham on 11/23/2019 12:17:43 [name], [name] ([id number]) -------------------------------------------------------------------------------- HPI Details Patient Name: [name] Date of Service: 11/23/2019 11:00 AM Medical Record Number: [id number] Patient Account Number: [id number] Date of Birth/Sex: Mar 23, 1931 (86 y.o. F) Treating RN: Cornell Barman Primary Care Provider: Emily Filbert Other Clinician: Referring Provider: Emily Filbert Treating Provider/Extender: Tito Dine in Treatment: 31 History of Present Illness HPI Description: ADMISSION 04/20/2019 This is an 86 year old woman who lives in the independent part of [place]. She is a type II diabetic on oral agents and insulin with a recent hemoglobin A1c of 6.3 on 10/6. She was initially seen by primary care on 11/12 after dropping a heavy walker on her bilateral dorsal feet. She had blisters on the right and left lateral foot. She was given doxycycline. She was seen the next day in [place] clinic on 11/13 continued on the.C. Bilateral foot x-rays were negative. She was admitted to [name] from 11/17 through 11/27 with acute kidney injury I did not research this although the patient tells me that at discharge her kidney function had returned to normal. During the course of this hospitalization it was noted she had wounds on her bilateral feet and her peripheral pulses were nonpalpable. She was evaluated by Dr. Delana Meyer. It was recommended that she consider angiography on the right leg and it was unlikely that she would heal the right leg as her ABI here was 0.48 with monophasic waveforms on the left her ABI was 1.19 with biphasic waveforms. One would wonder if the ABI was artificially elevated on the left. In any case Dr. Cydney Ok note accurately describes the patient's current state of mind. She does not want to rush into an intervention and she is here for our review of her wounds. Noteworthy that when she arrived I think her renal insufficiency was felt to be prerenal. Her GFR on arrival was 22 now 36. Past medical history includes type 2 diabetes with peripheral neuropathy, atrial fibrillation, renal insufficiency We did not calculate ABIs in our clinic this was 0.48 on the right and 1.19 on the left as noted above. Monophasic waveforms on the right biphasic on the left 12/16; patient admitted to the clinic last week with trauma critical limb  ischemia on the right. Today her wound on the left dorsal foot is healed. She still has  the black eschar on the dorsal right foot that we have been using Santyl on. There is apparently some drainage. She arrives in clinic today with more swelling on the right great and the left dorsal surrounding her foot wounds. Some erythema especially on the right. There is some tenderness on the right but not as much as I might expect if this was all infection nevertheless he does have some neuropathy which might be blunting discomfort. She reports no additional trauma. She is at the rehab section of [name], she lives in the independent part of [place]. She tells me she has some pain in the right leg at night which wakes her up from sleeping although she manages to get back to sleep. She has about 150 foot exercise tolerance and then gets fatigue in her legs. Again I discussed the issue of angiogram with the patient especially on the right. She just will not agree to this. She states she wants the rehab and then go to [facility] where a list of her specialists. She was on Levaquin last week we did not prescribe this but she is finished. She describes some nausea and loose stools but does not sound dramatically unwell with this Left dorsal foot had some very superficial areas today I think related to swelling. The big problem is on the dorsal right foot black surface eschar with increased swelling. She does not have any pain or tenderness although she has neuropathy. We have been using Santyl. She has PAD and again I think needs an angiogram. 12/30; 2-week follow-up. The areas on the right dorsal foot. Everything is healed on the left. She is still in the rehab part of [place]. She went to see Dr. Lucky Cowboy on 12/29. He reiterated to her he felt that she needed angiography on the right leg for critical limb ischemia. She seemed to have 2 major concerns that is wanting to see a nephrologist prior to procedure in  consultation. She does not currently have a nephrologist. She also had no confidence in the original arterial studies that were done in the [name] and wanted them redone. I do not think Dr. Lucky Cowboy felt that they needed to be redone and wanted to proceed with angiography. At the end of it he simply stated that he would be glad to do the angiogram at any time the patient agreed to proceed I looked briefly at her kidney function which had normalized by the time she left the [name] in late November. I do not know that the patient has any additional concern at this point other than the reduction in GFR associated with her diabetes and normal aging. 1/27; I have not seen this patient in almost 4 weeks. She was apparently isolated in the [facility] facility because of COVID-19. She tells me she was mildly symptomatic but feels better now although she still feels like she is in a "fog" as far as her mental abilities. She states that she is up and walking for short distances with a walker. She would like to go back to her independent apartment. Still using Santyl to the wound The patient is not describing a lot of pain. She does not describe claudication but her activity sounds very limited especially when she was under isolation. She is still reluctant to go forward with the angiogram that was suggested. Currently she wants to recover fully from Covid infection 2/10; 2-week follow-up. Patient arrives with 100% nonviable surface over the wound on her right dorsal foot.  I changed her to [name] [place] last week in terms of surface the wound is deteriorated although I think the surface area is about the same. She is not describing claudication walking about 50 feet The patient tells me she is trying to get in with her primary doctor however there is restrictions with regards to the pandemic. She wants to be referred to nephrology and then to vascular surgery at [name] I cannot help her with this. I  did tell her that her creatinine had normalized via the last lab work I could see done at [name] in November and her estimated GFR was 53 at that time. By my estimation careful application of contrast dye and preangiogram hydration should be able to get her through the necessary angiography with no complication however she simply does not want to hear this and she is going to put this off until she can get through the [place] system 2/24; 2-week follow-up wound surface area is about the same certainly not worse. She has 100% slough cover we have been using Santyl 3/3; 2-week follow-up. The patient's wound is essentially unchanged. The surface has slough on it no debridement it certainly does not look perfused. She is worried about the erythema she is having in her foot. She had a cramp in her leg last night but is not complaining of any other type of pain. Is also is concerned about some edema that is not well corrected by the diuretic she is on 3/16; 2-week follow-up. The wound is unchanged in appearance and in size. The patient has 100% nonviable surface. She saw Dr. Reginal Lutes, [place] ([id number]) Gari Crown on 07/21/2019 vascular at [facility] His statement was "chronic limb threatening ischemia". She was actually planned for a right leg arteriogram and intervention today however she states this was postponed and they are supposed to call her this afternoon to reschedule. Her dressing is being changed at [facility] 08/04/2019 upon evaluation today patient has had her evaluation with vascular at [facility]. Subsequently she unfortunately does not have good arterial flow into her bilateral lower extremities. She actually is doing worse on the left as compared to the right but neither is effectively receiving good blood flow from the heart down into the lower extremity. She tells me that she actually has her dressing changed on a regular basis at [name]. She is scheduled apparently to have intervention tomorrow in  regard to her right lower extremity and then they will proceed from there in regard to the left depend on how things go. 4/14; the patient was admitted to [facility] from 3/26 through 08/07/2019. She underwent an aortogram ultimately underwent a right popliteal orbital atherectomy and angioplasty, a right anterior tibial artery orbital atherectomy and angioplasty and a right dorsalis pedis artery angioplasty. She was kept in [name] for hydration secondary to a mild bump in her creatinine. I see she was back in the vascular clinic complaining of fatigue but this was not felt to be secondary to procedure which was not done under general anesthesia. She was apparently using Medihoney, the nurse at [name] is changing the dressing 4/21; the patient has been back to see vascular surgery at [facility] yesterday. She had an improvement in her right arterial studies which were initially done on 3/11. Although her ABI initially was 0.83 on 3/11 yesterday at 0.74 there was a fairly marked improvement in her TBI from 0.13-0.52. The patient is not complaining of any pain. We have been placing Iodoflex on the  wound under kerlix Coban 4/28; patient arrived in clinic with been using Iodoflex. Still requiring debridement but the wound is measuring smaller 5/5; still using Iodoflex kerlix Coban. Dimensions slightly smaller 5/12 wound is slightly smaller and the surface looks much better. We have been using Iodoflex with kerlix Coban. The patient is not having any pain around the wound She is describing some cramping in her leg at night which seems to be mostly in her upper thigh. She states its not every night and it does not seem severe. I am unable to pick up any claudication with activity. I looked through care everywhere. I cannot see her actual postoperative noninvasive studies but I can see them referenced in her surgeon's notes. On 4/20 her ABI was 0.74 TBI of 0.52 with biphasic waveforms on the right. On the left an  ABI of 0.62 and 0.48 TBI with monophasic and biphasic waveform 5/19; the wound continues to make nice progress. Healthy granulation. Changed to [name] 6/2; continued improvement in surface area. Healthy granulation she does have some eschar around the wound circumference although I do not think this appears to be inhibiting healing. She is using Hydrofera Blue. Dressing being changed at the [facility] facility where she is a resident 6/16; 2-week follow-up. Using Hydrofera Blue. Continued improvement in surface area. 6/30; wound exam; area on the foot continues to get smaller. Eschar and retained Hydrofera Blue over the area. She is having this changed at 7/14; disappointingly the wound on the dorsal right foot still eschared over. We have been using Hydrofera Blue Electronic Signature(s) Signed: 11/23/2019 4:21:24 PM By: Linton Ham MD Entered By: Linton Ham on 11/23/2019 12:18:08 [name], Nadine ([id number]) -------------------------------------------------------------------------------- Physical Exam Details Patient Name: [name] Date of Service: 11/23/2019 11:00 AM Medical Record Number: [id number] Patient Account Number: [id number] Date of Birth/Sex: October 28, 1930 (86 y.o. F) Treating RN: Cornell Barman Primary Care Provider: Emily Filbert Other Clinician: Referring Provider: Emily Filbert Treating Provider/Extender: Tito Dine in Treatment: 73 Constitutional Patient is hypertensive.. Pulse regular and within target range for patient.Marland Kitchen Respirations regular, non-labored and within target range.. Temperature is normal and within the target range for the patient.Marland Kitchen appears in no distress. Notes Wound exam; still thick eschar over a small area on the dorsal foot. I remove the eschar subcutaneous debris still small superficial open areas. Hemostasis with direct pressure Electronic Signature(s) Signed: 11/23/2019 4:21:24 PM By: Linton Ham MD Entered By: Linton Ham on 11/23/2019 12:18:48 [name], [place] ([id number]) -------------------------------------------------------------------------------- Physician Orders Details Patient Name: [name] Date of Service: 11/23/2019 11:00 AM Medical Record Number: [id number] Patient Account Number: [id number] Date of Birth/Sex: 07/20/1930 (86 y.o. F) Treating RN: Cornell Barman Primary Care Provider: Emily Filbert Other Clinician: Referring Provider: Emily Filbert Treating Provider/Extender: Tito Dine in Treatment: 37 Verbal / Phone Orders: No Diagnosis Coding Wound Cleansing Wound #2 Right,Dorsal Foot o Clean wound with Normal Saline. Anesthetic (add to Medication List) Wound #2 Right,Dorsal Foot o Topical Lidocaine 4% cream applied to wound bed prior to debridement (In Clinic Only). Skin Barriers/Peri-Wound Care Wound #2 Right,Dorsal Foot o Moisturizing lotion - on leg Primary Wound Dressing Wound #2 Right,Dorsal Foot o Other: - Poly mem Ag Secondary Dressing Wound #2 Right,Dorsal Foot o Dry Gauze Dressing Change Frequency Wound #2 Right,Dorsal Foot o Change Dressing Monday, Wednesday, Friday Follow-up Appointments Wound #2 Right,Dorsal Foot o Return Appointment in 2 weeks. Edema Control Wound #2 Right,Dorsal Foot o Kerlix and Coban - Right Lower Extremity o Elevate legs to  the level of the heart and pump ankles as often as possible [place] to change dressings Electronic Signature(s) Signed: 11/23/2019 4:21:24 PM By: Linton Ham MD Signed: 11/23/2019 6:25:04 PM By: Gretta Cool, BSN, RN, CWS, Kim RN, BSN Entered By: Gretta Cool, BSN, RN, CWS, Kim on 11/23/2019 11:24:12 [name], [place] ([id number]) -------------------------------------------------------------------------------- Problem List Details Patient Name: [name] Date of Service: 11/23/2019 11:00 AM Medical Record Number: [id number] Patient Account Number:  [id number] Date of Birth/Sex: 09/28/1930 (86 y.o. F) Treating RN: Cornell Barman Primary Care Provider: Emily Filbert Other Clinician: Referring Provider: Emily Filbert Treating Provider/Extender: Tito Dine in Treatment: 31 Active Problems ICD-10 Encounter Code Description Active Date MDM Diagnosis E11.52 Type 2 diabetes mellitus with diabetic peripheral angiopathy with 04/20/2019 No Yes gangrene L97.518 Non-pressure chronic ulcer of other part of right foot with other specified 04/20/2019 No Yes severity E11.42 Type 2 diabetes mellitus with diabetic polyneuropathy 04/20/2019 No Yes E11.621 Type 2 diabetes mellitus with foot ulcer 04/20/2019 No Yes Inactive Problems ICD-10 Code Description Active Date Inactive Date L97.528 Non-pressure chronic ulcer of other part of left foot with other specified 04/20/2019 04/20/2019 severity Resolved Problems Electronic Signature(s) Signed: 11/23/2019 4:21:24 PM By: Linton Ham MD Entered By: Linton Ham on 11/23/2019 12:17:24 [place], [place] ([id number]) -------------------------------------------------------------------------------- Progress Note Details Patient Name: [name] Date of Service: 11/23/2019 11:00 AM Medical Record Number: [id number] Patient Account Number: [id number] Date of Birth/Sex: [birth year] (86 y.o. F) Treating RN: Cornell Barman Primary Care Provider: Emily Filbert Other Clinician: Referring Provider: Emily Filbert Treating Provider/Extender: Tito Dine in Treatment: 31 Subjective History of Present Illness (HPI) ADMISSION 04/20/2019 This is an 86 year old woman who lives in the independent part of [place]. She is a type II diabetic on oral agents and insulin with a recent hemoglobin A1c of 6.3 on 10/6. She was initially seen by primary care on 11/12 after dropping a heavy walker on her bilateral dorsal feet. She had blisters on the right and left lateral foot. She was given doxycycline. She was seen  the next day in [place] clinic on 11/13 continued on the.C. Bilateral foot x-rays were negative. She was admitted to [name] from 11/17 through 11/27 with acute kidney injury I did not research this although the patient tells me that at discharge her kidney function had returned to normal. During the course of this hospitalization it was noted she had wounds on her bilateral feet and her peripheral pulses were nonpalpable. She was evaluated by Dr. Delana Meyer. It was recommended that she consider angiography on the right leg and it was unlikely that she would heal the right leg as her ABI here was 0.48 with monophasic waveforms on the left her ABI was 1.19 with biphasic waveforms. One would wonder if the ABI was artificially elevated on the left. In any case Dr. Cydney Ok note accurately describes the patient's current state of mind. She does not want to rush into an intervention and she is here for our review of her wounds. Noteworthy that when she arrived I think her renal insufficiency was felt to be prerenal. Her GFR on arrival was 22 now 36. Past medical history includes type 2 diabetes with peripheral neuropathy, atrial fibrillation, renal insufficiency We did not calculate ABIs in our clinic this was 0.48 on the right and 1.19 on the left as noted above. Monophasic waveforms on the right biphasic on the left 12/16; patient admitted to the clinic last week with trauma critical limb ischemia  on the right. Today her wound on the left dorsal foot is healed. She still has the black eschar on the dorsal right foot that we have been using Santyl on. There is apparently some drainage. She arrives in clinic today with more swelling on the right great and the left dorsal surrounding her foot wounds. Some erythema especially on the right. There is some tenderness on the right but not as much as I might expect if this was all infection nevertheless he does have some neuropathy which might be blunting  discomfort. She reports no additional trauma. She is at the rehab section of [facility], she lives in the independent part of [place]. She tells me she has some pain in the right leg at night which wakes her up from sleeping although she manages to get back to sleep. She has about 150 foot exercise tolerance and then gets fatigue in her legs. Again I discussed the issue of angiogram with the patient especially on the right. She just will not agree to this. She states she wants the rehab and then go to [name] where a list of her specialists. She was on Levaquin last week we did not prescribe this but she is finished. She describes some nausea and loose stools but does not sound dramatically unwell with this Left dorsal foot had some very superficial areas today I think related to swelling. The big problem is on the dorsal right foot black surface eschar with increased swelling. She does not have any pain or tenderness although she has neuropathy. We have been using Santyl. She has PAD and again I think needs an angiogram. 12/30; 2-week follow-up. The areas on the right dorsal foot. Everything is healed on the left. She is still in the rehab part of [place]. She went to see Dr. Lucky Cowboy on 12/29. He reiterated to her he felt that she needed angiography on the right leg for critical limb ischemia. She seemed to have 2 major concerns that is wanting to see a nephrologist prior to procedure in consultation. She does not currently have a nephrologist. She also had no confidence in the original arterial studies that were done in the [name] and wanted them redone. I do not think Dr. Lucky Cowboy felt that they needed to be redone and wanted to proceed with angiography. At the end of it he simply stated that he would be glad to do the angiogram at any time the patient agreed to proceed I looked briefly at her kidney function which had normalized by the time she left the [name] in late November. I do not know that  the patient has any additional concern at this point other than the reduction in GFR associated with her diabetes and normal aging. 1/27; I have not seen this patient in almost 4 weeks. She was apparently isolated in the [name] facility because of COVID-19. She tells me she was mildly symptomatic but feels better now although she still feels like she is in a "fog" as far as her mental abilities. She states that she is up and walking for short distances with a walker. She would like to go back to her independent apartment. Still using Santyl to the wound The patient is not describing a lot of pain. She does not describe claudication but her activity sounds very limited especially when she was under isolation. She is still reluctant to go forward with the angiogram that was suggested. Currently she wants to recover fully from Covid infection  2/10; 2-week follow-up. Patient arrives with 100% nonviable surface over the wound on her right dorsal foot. I changed her to [facility] from [place] last week in terms of surface the wound is deteriorated although I think the surface area is about the same. She is not describing claudication walking about 50 feet The patient tells me she is trying to get in with her primary doctor however there is restrictions with regards to the pandemic. She wants to be referred to nephrology and then to vascular surgery at [place] [name] I cannot help her with this. I did tell her that her creatinine had normalized via the last lab work I could see done at [facility] in November and her estimated GFR was 53 at that time. By my estimation careful application of contrast dye and preangiogram hydration should be able to get her through the necessary angiography with no complication however she simply does not want to hear this and she is going to put this off until she can get through the [place] system 2/24; 2-week follow-up wound surface area is about the same certainly not worse. She has  100% slough cover we have been using Santyl 3/3; 2-week follow-up. The patient's wound is essentially unchanged. The surface has slough on it no debridement it certainly does not look perfused. She is worried about the erythema she is having in her foot. She had a cramp in her leg last night but is not complaining of any other type of pain. Is also is concerned about some edema that is not well corrected by the diuretic she is on 3/16; 2-week follow-up. The wound is unchanged in appearance and in size. The patient has 100% nonviable surface. She saw Dr. Reginal Lutes, Cameron ([id number]) Gari Crown on 07/21/2019 vascular at [name]. His statement was "chronic limb threatening ischemia". She was actually planned for a right leg arteriogram and intervention today however she states this was postponed and they are supposed to call her this afternoon to reschedule. Her dressing is being changed at [facility] 08/04/2019 upon evaluation today patient has had her evaluation with vascular at [facility]. Subsequently she unfortunately does not have good arterial flow into her bilateral lower extremities. She actually is doing worse on the left as compared to the right but neither is effectively receiving good blood flow from the heart down into the lower extremity. She tells me that she actually has her dressing changed on a regular basis at [facility]. She is scheduled apparently to have intervention tomorrow in regard to her right lower extremity and then they will proceed from there in regard to the left depend on how things go. 4/14; the patient was admitted to [facility] from 3/26 through 08/07/2019. She underwent an aortogram ultimately underwent a right popliteal orbital atherectomy and angioplasty, a right anterior tibial artery orbital atherectomy and angioplasty and a right dorsalis pedis artery angioplasty. She was kept in [name] for hydration secondary to a mild bump in her creatinine. I see she was back in the  vascular clinic complaining of fatigue but this was not felt to be secondary to procedure which was not done under general anesthesia. She was apparently using Medih[employer], the nurse at [facility] is changing the dressing 4/21; the patient has been back to see vascular surgery at [facility] yesterday. She had an improvement in her right arterial studies which were initially done on 3/11. Although her ABI initially was 0.83 on 3/11 yesterday at 0.74 there was a fairly marked improvement in her TBI  from 0.13-0.52. The patient is not complaining of any pain. We have been placing Iodoflex on the wound under kerlix Coban 4/28; patient arrived in clinic with been using Iodoflex. Still requiring debridement but the wound is measuring smaller 5/5; still using Iodoflex kerlix Coban. Dimensions slightly smaller 5/12 wound is slightly smaller and the surface looks much better. We have been using Iodoflex with kerlix Coban. The patient is not having any pain around the wound She is describing some cramping in her leg at night which seems to be mostly in her upper thigh. She states its not every night and it does not seem severe. I am unable to pick up any claudication with activity. I looked through care everywhere. I cannot see her actual postoperative noninvasive studies but I can see them referenced in her surgeon's notes. On 4/20 her ABI was 0.74 TBI of 0.52 with biphasic waveforms on the right. On the left an ABI of 0.62 and 0.48 TBI with monophasic and biphasic waveform 5/19; the wound continues to make nice progress. Healthy granulation. Changed to [facility] 6/2; continued improvement in surface area. Healthy granulation she does have some eschar around the wound circumference although I do not think this appears to be inhibiting healing. She is using Hydrofera Blue. Dressing being changed at the [facility] facility where she is a resident 6/16; 2-week follow-up. Using Hydrofera Blue. Continued improvement  in surface area. 6/30; wound exam; area on the foot continues to get smaller. Eschar and retained Hydrofera Blue over the area. She is having this changed at 7/14; disappointingly the wound on the dorsal right foot still eschared over. We have been using Hydrofera Blue Objective Constitutional Patient is hypertensive.. Pulse regular and within target range for patient.Marland Kitchen Respirations regular, non-labored and within target range.. Temperature is normal and within the target range for the patient.Marland Kitchen appears in no distress. Vitals Time Taken: 11:07 AM, Height: 69 in, Weight: 195 lbs, BMI: 28.8, Temperature: 98.4 F, Pulse: 53 bpm, Respiratory Rate: 16 breaths/min, Blood Pressure: 162/72 mmHg. General Notes: Wound exam; still thick eschar over a small area on the dorsal foot. I remove the eschar subcutaneous debris still small superficial open areas. Hemostasis with direct pressure Integumentary (Hair, Skin) Wound #2 status is Open. Original cause of wound was Trauma. The wound is located on the Right,Dorsal Foot. The wound measures 1cm length x 0.5cm width x 0.1cm depth; 0.393cm^2 area and 0.039cm^3 volume. There is Fat Layer (Subcutaneous Tissue) Exposed exposed. There is no tunneling or undermining noted. There is a none present amount of drainage noted. The wound margin is thickened. There is no granulation within the wound bed. There is a large (67-100%) amount of necrotic tissue within the wound bed including Eschar. 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 There was a Excisional Skin/Subcutaneous Tissue Debridement  with a total area of 0.5 sq cm performed by Ricard Dillon, MD. With the following instrument(s): Curette Material removed includes Eschar and Subcutaneous Tissue and after achieving pain control using Lidocaine. No specimens were taken. A time out was conducted at 11:14, prior to the start of the procedure. A Minimum amount of bleeding was controlled with Pressure. The procedure was  tolerated well. Post Debridement Measurements: 1cm length x 0.5cm width x 0.2cm depth; 0.079cm^3 volume. Character of Wound/Ulcer Post Debridement is stable. Post procedure Diagnosis Wound #2: Same as Pre-Procedure Plan Wound Cleansing: Wound #2 Right,Dorsal Foot: Clean wound with Normal Saline. Anesthetic (add to Medication List): Wound #2 Right,Dorsal Foot: Topical Lidocaine 4% cream applied to wound bed prior to debridement (In Clinic Only). Skin Barriers/Peri-Wound Care: Wound #2 Right,Dorsal Foot: Moisturizing lotion - on leg Primary Wound Dressing: Wound #2 Right,Dorsal Foot: Other: - Poly mem Ag Secondary Dressing: Wound #2 Right,Dorsal Foot: Dry Gauze Dressing Change Frequency: Wound #2 Right,Dorsal Foot: Change Dressing Monday, Wednesday, Friday Follow-up Appointments: Wound #2 Right,Dorsal Foot: Return Appointment in 2 weeks. Edema Control: Wound #2 Right,Dorsal Foot: Kerlix and Coban - Right Lower Extremity Elevate legs to the level of the heart and pump ankles as often as possible Home Health: Felida to change dressings 1. I change the primary dressing to polymen hopefully this will take up some of the drainage that is forming the eschar on the wound 2. Follow-up in 2 weeks. Electronic Signature(s) Signed: 11/23/2019 4:21:24 PM By: Linton Ham MD Entered By: Linton Ham on 11/23/2019 12:19:24 [name], Aaliayah ([id number]) -------------------------------------------------------------------------------- SuperBill Details Patient Name: [name] Date of Service: 11/23/2019 Medical Record Number: [id number] Patient Account Number: [id number] Date of Birth/Sex: 12-29-1930 (86 y.o. F) Treating RN: Cornell Barman Primary Care Provider: Emily Filbert Other Clinician: Referring Provider: Emily Filbert Treating Provider/Extender: Tito Dine in Treatment: 31 Diagnosis Coding ICD-10 Codes Code Description E11.52 Type 2 diabetes mellitus with diabetic peripheral angiopathy with gangrene L97.518 Non-pressure chronic ulcer of other part of right foot with other specified severity E11.42 Type 2 diabetes mellitus with diabetic polyneuropathy E11.621 Type 2 diabetes mellitus with foot ulcer Facility Procedures CPT4 Code: [id number] Description: 29574 - DEB SUBQ TISSUE 20 SQ CM/< Modifier: Quantity: 1 CPT4 Code: Description: ICD-10 Diagnosis Description L97.518 Non-pressure chronic ulcer of other part of right foot with other specifi Modifier: ed severity Quantity: Physician Procedures CPT4 Code: 7340370 Description: 96438 - WC PHYS SUBQ TISS 20 SQ CM Modifier: Quantity: 1 CPT4 Code: Description: ICD-10 Diagnosis Description L97.518 Non-pressure chronic ulcer of other part of right foot with other specifi Modifier: ed severity Quantity: Electronic Signature(s) Signed: 11/23/2019 4:21:24 PM By: Linton Ham MD Entered By: Linton Ham on 11/23/2019 12:19:42

## 2019-12-07 ENCOUNTER — Other Ambulatory Visit: Payer: Self-pay

## 2019-12-07 ENCOUNTER — Encounter: Payer: Medicare Other | Admitting: Internal Medicine

## 2019-12-07 DIAGNOSIS — E11621 Type 2 diabetes mellitus with foot ulcer: Secondary | ICD-10-CM | POA: Diagnosis not present

## 2019-12-07 NOTE — Progress Notes (Signed)
[name], [name] ([id number]) Visit Report for 12/07/2019 HPI Details Patient Name: [name], [name] Date of Service: 12/07/2019 11:00 AM Medical Record Number: [id number] Patient Account Number: [id number] Date of Birth/Sex: [birth year] (86 y.o. F) Treating RN: Cornell Barman Primary Care Provider: Emily Filbert Other Clinician: Referring Provider: Emily Filbert Treating Provider/Extender: Tito Dine in Treatment: 79 History of Present Illness HPI Description: ADMISSION 04/20/2019 This is an 86 year old woman who lives in the independent part of [place]. She is a type II diabetic on oral agents and insulin with a recent hemoglobin A1c of 6.3 on 10/6. She was initially seen by primary care on 11/12 after dropping a heavy walker on her bilateral dorsal feet. She had blisters on the right and left lateral foot. She was given doxycycline. She was seen the next day in [place] clinic on 11/13 continued on the.C. Bilateral foot x-rays were negative. She was admitted to hospital from 11/17 through 11/27 with acute kidney injury I did not research this although the patient tells me that at discharge her kidney function had returned to normal. During the course of this hospitalization it was noted she had wounds on her bilateral feet and her peripheral pulses were nonpalpable. She was evaluated by Dr. Delana Meyer. It was recommended that she consider angiography on the right leg and it was unlikely that she would heal the right leg as her ABI here was 0.48 with monophasic waveforms on the left her ABI was 1.19 with biphasic waveforms. One would wonder if the ABI was artificially elevated on the left. In any case Dr. Cydney Ok note accurately describes the patient's current state of mind. She does not want to rush into an intervention and she is here for our review of her wounds. Noteworthy that when she arrived I think her renal insufficiency was felt to be prerenal. Her GFR on arrival was 22 now 36. Past  medical history includes type 2 diabetes with peripheral neuropathy, atrial fibrillation, renal insufficiency We did not calculate ABIs in our clinic this was 0.48 on the right and 1.19 on the left as noted above. Monophasic waveforms on the right biphasic on the left 12/16; patient admitted to the clinic last week with trauma critical limb ischemia on the right. Today her wound on the left dorsal foot is healed. She still has the black eschar on the dorsal right foot that we have been using Santyl on. There is apparently some drainage. She arrives in clinic today with more swelling on the right great and the left dorsal surrounding her foot wounds. Some erythema especially on the right. There is some tenderness on the right but not as much as I might expect if this was all infection nevertheless he does have some neuropathy which might be blunting discomfort. She reports no additional trauma. She is at the rehab section of [facility], she lives in the independent part of [place]. She tells me she has some pain in the right leg at night which wakes her up from sleeping although she manages to get back to sleep. She has about 150 foot exercise tolerance and then gets fatigue in her legs. Again I discussed the issue of angiogram with the patient especially on the right. She just will not agree to this. She states she wants the rehab and then go to [facility] where a list of her specialists. She was on Levaquin last week we did not prescribe this but she is finished. She describes some nausea and loose stools but does  not sound dramatically unwell with this Left dorsal foot had some very superficial areas today I think related to swelling. The big problem is on the dorsal right foot black surface eschar with increased swelling. She does not have any pain or tenderness although she has neuropathy. We have been using Santyl. She has PAD and again I think needs an angiogram. 12/30; 2-week follow-up. The areas  on the right dorsal foot. Everything is healed on the left. She is still in the rehab part of [place]. She went to see Dr. Lucky Cowboy on 12/29. He reiterated to her he felt that she needed angiography on the right leg for critical limb ischemia. She seemed to have 2 major concerns that is wanting to see a nephrologist prior to procedure in consultation. She does not currently have a nephrologist. She also had no confidence in the original arterial studies that were done in the hospital and wanted them redone. I do not think Dr. Lucky Cowboy felt that they needed to be redone and wanted to proceed with angiography. At the end of it he simply stated that he would be glad to do the angiogram at any time the patient agreed to proceed I looked briefly at her kidney function which had normalized by the time she left the hospital in late November. I do not know that the patient has any additional concern at this point other than the reduction in GFR associated with her diabetes and normal aging. 1/27; I have not seen this patient in almost 4 weeks. She was apparently isolated in the [facility] facility because of COVID-19. She tells me she was mildly symptomatic but feels better now although she still feels like she is in a "fog" as far as her mental abilities. She states that she is up and walking for short distances with a walker. She would like to go back to her independent apartment. Still using Santyl to the wound The patient is not describing a lot of pain. She does not describe claudication but her activity sounds very limited especially when she was under isolation. She is still reluctant to go forward with the angiogram that was suggested. Currently she wants to recover fully from Covid infection 2/10; 2-week follow-up. Patient arrives with 100% nonviable surface over the wound on her right dorsal foot. I changed her to [facility] from [place] last week in terms of surface the wound is deteriorated although I  think the surface area is about the same. She is not describing claudication walking about 50 feet The patient tells me she is trying to get in with her primary doctor however there is restrictions with regards to the pandemic. She wants to be referred to nephrology and then to vascular surgery at [name] I cannot help her with this. I did tell her that her creatinine had normalized via the last lab work I could see done at [facility] in November and her estimated GFR was 53 at that time. By my estimation careful application of contrast dye and preangiogram hydration should be able to get her through the necessary angiography with no complication however she simply does not want to hear this and she is going to put this off until she can get through the [place] system 2/24; 2-week follow-up wound surface area is about the same certainly not worse. She has 100% slough cover we have been using Soyla Dryer, [place] ([id number]) 3/3; 2-week follow-up. The patient's wound is essentially unchanged. The surface has slough on it no  debridement it certainly does not look perfused. She is worried about the erythema she is having in her foot. She had a cramp in her leg last night but is not complaining of any other type of pain. Is also is concerned about some edema that is not well corrected by the diuretic she is on 3/16; 2-week follow-up. The wound is unchanged in appearance and in size. The patient has 100% nonviable surface. She saw Dr. Bonnee Quin on 07/21/2019 vascular at [facility]. His statement was "chronic limb threatening ischemia". She was actually planned for a right leg arteriogram and intervention today however she states this was postponed and they are supposed to call her this afternoon to reschedule. Her dressing is being changed at [facility] 08/04/2019 upon evaluation today patient has had her evaluation with vascular at [facility]. Subsequently she unfortunately does not have good arterial flow into her  bilateral lower extremities. She actually is doing worse on the left as compared to the right but neither is effectively receiving good blood flow from the heart down into the lower extremity. She tells me that she actually has her dressing changed on a regular basis at [facility]. She is scheduled apparently to have intervention tomorrow in regard to her right lower extremity and then they will proceed from there in regard to the left depend on how things go. 4/14; the patient was admitted to [name] from 3/26 through 08/07/2019. She underwent an aortogram ultimately underwent a right popliteal orbital atherectomy and angioplasty, a right anterior tibial artery orbital atherectomy and angioplasty and a right dorsalis pedis artery angioplasty. She was kept in hospital for hydration secondary to a mild bump in her creatinine. I see she was back in the vascular clinic complaining of fatigue but this was not felt to be secondary to procedure which was not done under general anesthesia. She was apparently using Medihoney, the nurse at [facility] is changing the dressing 4/21; the patient has been back to see vascular surgery at [facility] yesterday. She had an improvement in her right arterial studies which were initially done on 3/11. Although her ABI initially was 0.83 on 3/11 yesterday at 0.74 there was a fairly marked improvement in her TBI from 0.13-0.52. The patient is not complaining of any pain. We have been placing Iodoflex on the wound under kerlix Coban 4/28; patient arrived in clinic with been using Iodoflex. Still requiring debridement but the wound is measuring smaller 5/5; still using Iodoflex kerlix Coban. Dimensions slightly smaller 5/12 wound is slightly smaller and the surface looks much better. We have been using Iodoflex with kerlix Coban. The patient is not having any pain around the wound She is describing some cramping in her leg at night which seems to be mostly in her upper thigh. She  states its not every night and it does not seem severe. I am unable to pick up any claudication with activity. I looked through care everywhere. I cannot see her actual postoperative noninvasive studies but I can see them referenced in her surgeon's notes. On 4/20 her ABI was 0.74 TBI of 0.52 with biphasic waveforms on the right. On the left an ABI of 0.62 and 0.48 TBI with monophasic and biphasic waveform 5/19; the wound continues to make nice progress. Healthy granulation. Changed to [facility] 6/2; continued improvement in surface area. Healthy granulation she does have some eschar around the wound circumference although I do not think this appears to be inhibiting healing. She is using Hydrofera Blue. Dressing  being changed at the [facility] facility where she is a resident 6/16; 2-week follow-up. Using Hydrofera Blue. Continued improvement in surface area. 6/30; wound exam; area on the foot continues to get smaller. Eschar and retained Hydrofera Blue over the area. She is having this changed at 7/14; disappointingly the wound on the dorsal right foot still eschared over. We have been using Hydrofera Blue 7/28; she arrives again with reasonably thick eschar over the wound surface. We have been using polymen Ag. Electronic Signature(s) Signed: 12/07/2019 3:56:44 PM By: Linton Ham MD Entered By: Linton Ham on 12/07/2019 11:44:35 [name], [name] ([id number]) -------------------------------------------------------------------------------- Physical Exam Details Patient Name: [name] Date of Service: 12/07/2019 11:00 AM Medical Record Number: [id number] Patient Account Number: [id number] Date of Birth/Sex: 16-Oct-1930 (86 y.o. F) Treating RN: Cornell Barman Primary Care Provider: Emily Filbert Other Clinician: Referring Provider: Emily Filbert Treating Provider/Extender: Tito Dine in Treatment: 53 Cardiovascular Pedal pulses palpable but not robust. Integumentary (Hair,  Skin) No erythema around any wound area. Notes Wound exam; still thick eschar over the 100% of the wound surface. This was separating in places using a #5 curette I carefully remove this but everything underneath is epithelialized. The area still looks somewhat vulnerable but there was no open wound. Electronic Signature(s) Signed: 12/07/2019 3:56:44 PM By: Linton Ham MD Entered By: Linton Ham on 12/07/2019 11:45:40 [name], [name] ([id number]) -------------------------------------------------------------------------------- Physician Orders Details Patient Name: [name] Date of Service: 12/07/2019 11:00 AM Medical Record Number: [id number] Patient Account Number: [id number] Date of Birth/Sex: December 29, 1930 (86 y.o. F) Treating RN: Grover Canavan Primary Care Provider: Emily Filbert Other Clinician: Referring Provider: Emily Filbert Treating Provider/Extender: Tito Dine in Treatment: 59 Verbal / Phone Orders: No Diagnosis Coding Skin Barriers/Peri-Wound Care Wound #2 Right,Dorsal Foot o Moisturizing lotion - on leg Secondary Dressing o Boardered Foam Dressing Dressing Change Frequency Wound #2 Right,Dorsal Foot o Change Dressing Monday, Wednesday, Friday Follow-up Appointments Wound #2 Right,Dorsal Foot o Return Appointment in 1 month Edema Control o Elevate legs to the level of the heart and pump ankles as often as possible [place] to change dressings Electronic Signature(s) Signed: 12/07/2019 3:56:44 PM By: Linton Ham MD Signed: 12/07/2019 4:16:31 PM By: Grover Canavan Entered By: Grover Canavan on 12/07/2019 11:28:34 [name], [name] ([id number]) -------------------------------------------------------------------------------- Problem List Details Patient Name: [name] Date of Service: 12/07/2019 11:00 AM Medical Record Number: [id number] Patient Account Number: [id number] Date of  Birth/Sex: 07/20/1930 (86 y.o. F) Treating RN: Grover Canavan Primary Care Provider: Emily Filbert Other Clinician: Referring Provider: Emily Filbert Treating Provider/Extender: Tito Dine in Treatment: 58 Active Problems ICD-10 Encounter Code Description Active Date MDM Diagnosis E11.52 Type 2 diabetes mellitus with diabetic peripheral angiopathy with 04/20/2019 No Yes gangrene L97.518 Non-pressure chronic ulcer of other part of right foot with other specified 04/20/2019 No Yes severity E11.42 Type 2 diabetes mellitus with diabetic polyneuropathy 04/20/2019 No Yes E11.621 Type 2 diabetes mellitus with foot ulcer 04/20/2019 No Yes Inactive Problems ICD-10 Code Description Active Date Inactive Date L97.528 Non-pressure chronic ulcer of other part of left foot with other specified 04/20/2019 04/20/2019 severity Resolved Problems Electronic Signature(s) Signed: 12/07/2019 3:56:44 PM By: Linton Ham MD Entered By: Linton Ham on 12/07/2019 11:43:41 [name], [name] ([id number]) -------------------------------------------------------------------------------- Progress Note Details Patient Name: [name] Date of Service: 12/07/2019 11:00 AM Medical Record Number: [id number] Patient Account Number: [id number] Date of Birth/Sex: 05-17-1930 (86 y.o. F) Treating RN: Cornell Barman Primary Care Provider: Sabra Heck,  Elta Guadeloupe Other Clinician: Referring Provider: Emily Filbert Treating Provider/Extender: Tito Dine in Treatment: 69 Subjective History of Present Illness (HPI) ADMISSION 04/20/2019 This is an 86 year old woman who lives in the independent part of [place]. She is a type II diabetic on oral agents and insulin with a recent hemoglobin A1c of 6.3 on 10/6. She was initially seen by primary care on 11/12 after dropping a heavy walker on her bilateral dorsal feet. She had blisters on the right and left lateral foot. She was given doxycycline. She was seen the next day  in [place] clinic on 11/13 continued on the.C. Bilateral foot x-rays were negative. She was admitted to hospital from 11/17 through 11/27 with acute kidney injury I did not research this although the patient tells me that at discharge her kidney function had returned to normal. During the course of this hospitalization it was noted she had wounds on her bilateral feet and her peripheral pulses were nonpalpable. She was evaluated by Dr. Delana Meyer. It was recommended that she consider angiography on the right leg and it was unlikely that she would heal the right leg as her ABI here was 0.48 with monophasic waveforms on the left her ABI was 1.19 with biphasic waveforms. One would wonder if the ABI was artificially elevated on the left. In any case Dr. Cydney Ok note accurately describes the patient's current state of mind. She does not want to rush into an intervention and she is here for our review of her wounds. Noteworthy that when she arrived I think her renal insufficiency was felt to be prerenal. Her GFR on arrival was 22 now 36. Past medical history includes type 2 diabetes with peripheral neuropathy, atrial fibrillation, renal insufficiency We did not calculate ABIs in our clinic this was 0.48 on the right and 1.19 on the left as noted above. Monophasic waveforms on the right biphasic on the left 12/16; patient admitted to the clinic last week with trauma critical limb ischemia on the right. Today her wound on the left dorsal foot is healed. She still has the black eschar on the dorsal right foot that we have been using Santyl on. There is apparently some drainage. She arrives in clinic today with more swelling on the right great and the left dorsal surrounding her foot wounds. Some erythema especially on the right. There is some tenderness on the right but not as much as I might expect if this was all infection nevertheless he does have some neuropathy which might be blunting discomfort. She  reports no additional trauma. She is at the rehab section of [name], she lives in the independent part of [place]. She tells me she has some pain in the right leg at night which wakes her up from sleeping although she manages to get back to sleep. She has about 150 foot exercise tolerance and then gets fatigue in her legs. Again I discussed the issue of angiogram with the patient especially on the right. She just will not agree to this. She states she wants the rehab and then go to [facility] where a list of her specialists. She was on Levaquin last week we did not prescribe this but she is finished. She describes some nausea and loose stools but does not sound dramatically unwell with this Left dorsal foot had some very superficial areas today I think related to swelling. The big problem is on the dorsal right foot black surface eschar with increased swelling. She does not have any pain  or tenderness although she has neuropathy. We have been using Santyl. She has PAD and again I think needs an angiogram. 12/30; 2-week follow-up. The areas on the right dorsal foot. Everything is healed on the left. She is still in the rehab part of [place]. She went to see Dr. Lucky Cowboy on 12/29. He reiterated to her he felt that she needed angiography on the right leg for critical limb ischemia. She seemed to have 2 major concerns that is wanting to see a nephrologist prior to procedure in consultation. She does not currently have a nephrologist. She also had no confidence in the original arterial studies that were done in the hospital and wanted them redone. I do not think Dr. Lucky Cowboy felt that they needed to be redone and wanted to proceed with angiography. At the end of it he simply stated that he would be glad to do the angiogram at any time the patient agreed to proceed I looked briefly at her kidney function which had normalized by the time she left the hospital in late November. I do not know that the patient  has any additional concern at this point other than the reduction in GFR associated with her diabetes and normal aging. 1/27; I have not seen this patient in almost 4 weeks. She was apparently isolated in the [facility] facility because of COVID-19. She tells me she was mildly symptomatic but feels better now although she still feels like she is in a "fog" as far as her mental abilities. She states that she is up and walking for short distances with a walker. She would like to go back to her independent apartment. Still using Santyl to the wound The patient is not describing a lot of pain. She does not describe claudication but her activity sounds very limited especially when she was under isolation. She is still reluctant to go forward with the angiogram that was suggested. Currently she wants to recover fully from Covid infection 2/10; 2-week follow-up. Patient arrives with 100% nonviable surface over the wound on her right dorsal foot. I changed her to [facility] from [place] last week in terms of surface the wound is deteriorated although I think the surface area is about the same. She is not describing claudication walking about 50 feet The patient tells me she is trying to get in with her primary doctor however there is restrictions with regards to the pandemic. She wants to be referred to nephrology and then to vascular surgery at [facility] I cannot help her with this. I did tell her that her creatinine had normalized via the last lab work I could see done at [facility] in November and her estimated GFR was 53 at that time. By my estimation careful application of contrast dye and preangiogram hydration should be able to get her through the necessary angiography with no complication however she simply does not want to hear this and she is going to put this off until she can get through the [place] system 2/24; 2-week follow-up wound surface area is about the same certainly not worse. She has 100% slough  cover we have been using Santyl 3/3; 2-week follow-up. The patient's wound is essentially unchanged. The surface has slough on it no debridement it certainly does not look perfused. She is worried about the erythema she is having in her foot. She had a cramp in her leg last night but is not complaining of any other type of pain. Is also is concerned about some  edema that is not well corrected by the diuretic she is on 3/16; 2-week follow-up. The wound is unchanged in appearance and in size. The patient has 100% nonviable surface. She saw Dr. Reginal Lutes, [place] ([id number]) Gari Crown on 07/21/2019 vascular at [facility]. His statement was "chronic limb threatening ischemia". She was actually planned for a right leg arteriogram and intervention today however she states this was postponed and they are supposed to call her this afternoon to reschedule. Her dressing is being changed at [facility] 08/04/2019 upon evaluation today patient has had her evaluation with vascular at [facility]. Subsequently she unfortunately does not have good arterial flow into her bilateral lower extremities. She actually is doing worse on the left as compared to the right but neither is effectively receiving good blood flow from the heart down into the lower extremity. She tells me that she actually has her dressing changed on a regular basis at [facility]. She is scheduled apparently to have intervention tomorrow in regard to her right lower extremity and then they will proceed from there in regard to the left depend on how things go. 4/14; the patient was admitted to [facility] from 3/26 through 08/07/2019. She underwent an aortogram ultimately underwent a right popliteal orbital atherectomy and angioplasty, a right anterior tibial artery orbital atherectomy and angioplasty and a right dorsalis pedis artery angioplasty. She was kept in hospital for hydration secondary to a mild bump in her creatinine. I see she was back in the vascular clinic  complaining of fatigue but this was not felt to be secondary to procedure which was not done under general anesthesia. She was apparently using Medihoney, the nurse at [facility] is changing the dressing 4/21; the patient has been back to see vascular surgery at [facility] yesterday. She had an improvement in her right arterial studies which were initially done on 3/11. Although her ABI initially was 0.83 on 3/11 yesterday at 0.74 there was a fairly marked improvement in her TBI from 0.13-0.52. The patient is not complaining of any pain. We have been placing Iodoflex on the wound under kerlix Coban 4/28; patient arrived in clinic with been using Iodoflex. Still requiring debridement but the wound is measuring smaller 5/5; still using Iodoflex kerlix Coban. Dimensions slightly smaller 5/12 wound is slightly smaller and the surface looks much better. We have been using Iodoflex with kerlix Coban. The patient is not having any pain around the wound She is describing some cramping in her leg at night which seems to be mostly in her upper thigh. She states its not every night and it does not seem severe. I am unable to pick up any claudication with activity. I looked through care everywhere. I cannot see her actual postoperative noninvasive studies but I can see them referenced in her surgeon's notes. On 4/20 her ABI was 0.74 TBI of 0.52 with biphasic waveforms on the right. On the left an ABI of 0.62 and 0.48 TBI with monophasic and biphasic waveform 5/19; the wound continues to make nice progress. Healthy granulation. Changed to [facility] 6/2; continued improvement in surface area. Healthy granulation she does have some eschar around the wound circumference although I do not think this appears to be inhibiting healing. She is using Hydrofera Blue. Dressing being changed at the [facility] facility where she is a resident 6/16; 2-week follow-up. Using Hydrofera Blue. Continued improvement in surface  area. 6/30; wound exam; area on the foot continues to get smaller. Eschar and retained Hydrofera Blue over the  area. She is having this changed at 7/14; disappointingly the wound on the dorsal right foot still eschared over. We have been using Hydrofera Blue 7/28; she arrives again with reasonably thick eschar over the wound surface. We have been using polymen Ag. Objective Constitutional Vitals Time Taken: 10:50 AM, Height: 69 in, Weight: 195 lbs, BMI: 28.8, Temperature: 98.4 F, Pulse: 57 bpm, Respiratory Rate: 16 breaths/min, Blood Pressure: 153/82 mmHg. Cardiovascular Pedal pulses palpable but not robust. General Notes: Wound exam; still thick eschar over the 100% of the wound surface. This was separating in places using a #5 curette I carefully remove this but everything underneath is epithelialized. The area still looks somewhat vulnerable but there was no open wound. Integumentary (Hair, Skin) No erythema around any wound area. Wound #2 status is Open. Original cause of wound was Trauma. The wound is located on the Right,Dorsal Foot. The wound measures 1cm length x 0.5cm width x 0.1cm depth; 0.393cm^2 area and 0.039cm^3 volume. There is Fat Layer (Subcutaneous Tissue) Exposed exposed. There is a none present amount of drainage noted. The wound margin is thickened. There is no granulation within the wound bed. There is a large (67- 100%) amount of necrotic tissue within the wound bed including Eschar. 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 The area is epithelialized. I simply used border foam to protect this. The nurses at [facility] are changing this 3 times a week which should suffice 2. The patient has been back to see vascular surgery at [facility] before she comes back I will see if I can see the record in care everywhere 3. I would like to see this 1 more time just to make sure it maintain skin integrity. No compression is necessary Electronic Signature(s) Signed: 12/07/2019 3:56:44 PM By: Linton Ham MD Entered By: Linton Ham on 12/07/2019 11:47:01 [name], [name] ([id number]) -------------------------------------------------------------------------------- SuperBill Details Patient Name: [name] Date of Service: 12/07/2019 Medical Record Number: [id number] Patient Account Number: [id number] Date of Birth/Sex: 29-Aug-1930 (86 y.o. F) Treating RN: Grover Canavan Primary Care Provider: Emily Filbert Other Clinician: Referring Provider: Emily Filbert Treating Provider/Extender: Tito Dine in Treatment: 33 Diagnosis Coding ICD-10 Codes Code Description E11.52 Type 2 diabetes mellitus with diabetic peripheral angiopathy with gangrene L97.518 Non-pressure chronic ulcer of other part of right foot with other specified severity E11.42 Type 2 diabetes mellitus with diabetic polyneuropathy E11.621 Type 2 diabetes mellitus with foot ulcer Facility Procedures CPT4 Code: 10932355 Description: [phone] - WOUND CARE VISIT-LEV 2 EST PT Modifier: Quantity: 1 Physician Procedures CPT4 Code: 2542706 Description: 99213 - WC PHYS LEVEL 3 - EST PT Modifier: Quantity: 1 CPT4 Code: Description: ICD-10  Diagnosis Description E11.52 Type 2 diabetes mellitus with diabetic peripheral angiopathy with gangre E11.621 Type 2 diabetes mellitus with foot ulcer L97.518 Non-pressure chronic ulcer of other part of right foot with other specif Modifier: ne ied  severity Quantity: Electronic Signature(s) Signed: 12/07/2019 3:56:44 PM By: Linton Ham MD Entered By: Linton Ham on 12/07/2019 11:47:26

## 2019-12-07 NOTE — Progress Notes (Signed)
[place], [place] ([id number]) Visit Report for 12/07/2019 Arrival Information Details Patient Name: [name], [name] [facility] Date of Service: 12/07/2019 11:00 AM Medical Record Number: [id number] Patient Account Number: [id number] Date of Birth/Sex: [birth year] (86 y.o. F) Treating RN: Cornell Barman Primary Care Malayzia Laforte: Emily Filbert Other Clinician: Referring Tahsin Benyo: Emily Filbert Treating Cleola Perryman/Extender: Tito Dine in Treatment: 52 Visit Information History Since Last Visit Added or deleted any medications: No Patient Arrived: Walker Any new allergies or adverse reactions: No Arrival Time: 10:51 Had a fall or experienced change in No Accompanied By: self activities of daily living that may affect Transfer Assistance: None risk of falls: Patient Identification Verified: Yes Signs or symptoms of abuse/neglect since last visito No Secondary Verification Process Completed: Yes Hospitalized since last visit: No Patient Has Alerts: Yes Implantable device outside of the clinic excluding No Patient Alerts: Patient on Blood Thinner cellular tissue based products placed in the center ABI L 1.19 R 0.48 03/2019 since last visit: 2-325mg  Aspirin daily Has Dressing in Place as Prescribed: Yes TBI R .13 L .42 Pain Present Now: No Electronic Signature(s) Signed: 12/07/2019 2:14:25 PM By: Darci Needle Entered By: Darci Needle on 12/07/2019 11:01:07 [name], [name] ([id number]) -------------------------------------------------------------------------------- Clinic Level of Care Assessment Details Patient Name: [name] Date of Service: 12/07/2019 11:00 AM Medical Record Number: [id number] Patient Account Number: [id number] Date of Birth/Sex: [birth year] (86 y.o. F) Treating RN: Grover Canavan Primary Care Kellyann Ordway: Emily Filbert Other Clinician: Referring Kaisyn Reinhold: Emily Filbert Treating Recardo Linn/Extender: Tito Dine in Treatment: 78 Clinic Level of Care Assessment  Items TOOL 4 Quantity Score []  - Use when only an EandM is performed on FOLLOW-UP visit 0 ASSESSMENTS - Nursing Assessment / Reassessment X - Reassessment of Co-morbidities (includes updates in patient status) 1 10 X- 1 5 Reassessment of Adherence to Treatment Plan ASSESSMENTS - Wound and Skin Assessment / Reassessment X - Simple Wound Assessment / Reassessment - one wound 1 5 []  - 0 Complex Wound Assessment / Reassessment - multiple wounds []  - 0 Dermatologic / Skin Assessment (not related to wound area) ASSESSMENTS - Focused Assessment []  - Circumferential Edema Measurements - multi extremities 0 []  - 0 Nutritional Assessment / Counseling / Intervention []  - 0 Lower Extremity Assessment (monofilament, tuning fork, pulses) []  - 0 Peripheral Arterial Disease Assessment (using hand held doppler) ASSESSMENTS - Ostomy and/or Continence Assessment and Care []  - Incontinence Assessment and Management 0 []  - 0 Ostomy Care Assessment and Management (repouching, etc.) PROCESS - Coordination of Care []  - Simple Patient / Family Education for ongoing care 0 []  - 0 Complex (extensive) Patient / Family Education for ongoing care []  - 0 Staff obtains Programmer, systems, Records, Test Results / Process Orders []  - 0 Staff telephones HHA, Nursing Homes / Clarify orders / etc []  - 0 Routine Transfer to another Facility (non-emergent condition) []  - 0 Routine Hospital Admission (non-emergent condition) []  - 0 New Admissions / Biomedical engineer / Ordering NPWT, Apligraf, etc. []  - 0 Emergency Hospital Admission (emergent condition) []  - 0 Simple Discharge Coordination []  - 0 Complex (extensive) Discharge Coordination PROCESS - Special Needs []  - Pediatric / Minor Patient Management 0 []  - 0 Isolation Patient Management []  - 0 Hearing / Language / Visual special needs []  - 0 Assessment of Community assistance (transportation, D/C planning, etc.) []  - 0 Additional assistance / Altered  mentation []  - 0 Support Surface(s) Assessment (bed, cushion, seat, etc.) INTERVENTIONS - Wound Cleansing / Measurement [name], [name] ([id number]) X- 1 5 Simple Wound Cleansing -  one wound []  - 0 Complex Wound Cleansing - multiple wounds X- 1 5 Wound Imaging (photographs - any number of wounds) []  - 0 Wound Tracing (instead of photographs) []  - 0 Simple Wound Measurement - one wound []  - 0 Complex Wound Measurement - multiple wounds INTERVENTIONS - Wound Dressings X - Small Wound Dressing one or multiple wounds 1 10 []  - 0 Medium Wound Dressing one or multiple wounds []  - 0 Large Wound Dressing one or multiple wounds []  - 0 Application of Medications - topical []  - 0 Application of Medications - injection INTERVENTIONS - Miscellaneous []  - External ear exam 0 []  - 0 Specimen Collection (cultures, biopsies, blood, body fluids, etc.) []  - 0 Specimen(s) / Culture(s) sent or taken to Lab for analysis []  - 0 Patient Transfer (multiple staff / Civil Service fast streamer / Similar devices) []  - 0 Simple Staple / Suture removal (25 or less) []  - 0 Complex Staple / Suture removal (26 or more) []  - 0 Hypo / Hyperglycemic Management (close monitor of Blood Glucose) []  - 0 Ankle / Brachial Index (ABI) - do not check if billed separately X- 1 5 Vital Signs Has the patient been seen at the hospital within the last three years: Yes Total Score: 45 Level Of Care: New/Established - Level 2 Electronic Signature(s) Signed: 12/07/2019 4:16:31 PM By: Grover Canavan Entered By: Grover Canavan on 12/07/2019 11:29:34 [name], [name] ([id number]) -------------------------------------------------------------------------------- Encounter Discharge Information Details Patient Name: [name] Date of Service: 12/07/2019 11:00 AM Medical Record Number: [id number] Patient Account Number: [id number] Date of Birth/Sex: 1930/08/22 (86 y.o. F) Treating RN: Grover Canavan Primary Care Verginia Toohey: Emily Filbert  Other Clinician: Referring Arian Mcquitty: Emily Filbert Treating Egan Sahlin/Extender: Tito Dine in Treatment: 70 Encounter Discharge Information Items Discharge Condition: Stable Ambulatory Status: Walker Discharge Destination: Other (Note Required) Telephoned: No Orders Sent: Yes Transportation: Other Accompanied By: self Schedule Follow-up Appointment: Yes Clinical Summary of Care: Electronic Signature(s) Signed: 12/07/2019 4:16:31 PM By: Grover Canavan Entered By: Grover Canavan on 12/07/2019 11:32:09 [name], [name] ([id number]) -------------------------------------------------------------------------------- Lower Extremity Assessment Details Patient Name: [name] Date of Service: 12/07/2019 11:00 AM Medical Record Number: [id number] Patient Account Number: [id number] Date of Birth/Sex: 1931-02-20 (86 y.o. F) Treating RN: Cornell Barman Primary Care Ahtziry Saathoff: Emily Filbert Other Clinician: Referring Odessa Nishi: Emily Filbert Treating Keenen Roessner/Extender: Tito Dine in Treatment: 74 Electronic Signature(s) Signed: 12/07/2019 2:14:25 PM By: Darci Needle Signed: 12/07/2019 4:56:00 PM By: Gretta Cool BSN, RN, CWS, Kim RN, BSN Entered By: Darci Needle on 12/07/2019 11:02:34 Tawni [name], [name] ([id number]) -------------------------------------------------------------------------------- Multi Wound Chart Details Patient Name: [name] Date of Service: 12/07/2019 11:00 AM Medical Record Number: [id number] Patient Account Number: [id number] Date of Birth/Sex: 01-29-1931 (86 y.o. F) Treating RN: Cornell Barman Primary Care Yzabelle Calles: Emily Filbert Other Clinician: Referring Lyndsee Casa: Emily Filbert Treating Teran Daughenbaugh/Extender: Tito Dine in Treatment: 57 Vital Signs Height(in): 69 Pulse(bpm): 21 Weight(lbs): 195 Blood Pressure(mmHg): 153/82 Body Mass Index(BMI): 29 Temperature(F): 98.4 Respiratory Rate(breaths/min): 16 Photos: [N/A:N/A] Wound Location:  Right, Dorsal Foot N/A N/A Wounding Event: Trauma N/A N/A Primary Etiology: Cellulitis N/A N/A Comorbid History: Cataracts, Arrhythmia, Congestive N/A N/A Heart Failure, Hypertension, Type II Diabetes, Rheumatoid Arthritis Date Acquired: 03/24/2019 N/A N/A Weeks of Treatment: 33 N/A N/A Wound Status: Open N/A N/A Measurements L x W x D (cm) 1x0.5x0.1 N/A N/A Area (cm) : 0.393 N/A N/A Volume (cm) : 0.039 N/A N/A % Reduction in Area: 94.00% N/A N/A % Reduction in Volume: 94.10% N/A N/A Classification: Full Thickness Without Exposed  N/A N/A Support Structures Exudate Amount: None Present N/A N/A Wound Margin: Thickened N/A N/A Granulation Amount: None Present (0%) N/A N/A Necrotic Amount: Large (67-100%) N/A N/A Necrotic Tissue: Eschar N/A N/A Exposed Structures: Fat Layer (Subcutaneous Tissue) N/A N/A Exposed: Yes Fascia: No Tendon: No Muscle: No Joint: No Bone: No Epithelialization: Medium (34-66%) N/A N/A Treatment Notes Wound #2 (Right, Dorsal Foot) Notes BFD to wound, change 3 x weekly Electronic Signature(s) [name], [name] ([id number]) Signed: 12/07/2019 3:56:44 PM By: Linton Ham MD Entered By: Linton Ham on 12/07/2019 11:43:54 [name], [name] ([id number]) -------------------------------------------------------------------------------- Multi-Disciplinary Care Plan Details Patient Name: [name] Date of Service: 12/07/2019 11:00 AM Medical Record Number: [id number] Patient Account Number: [id number] Date of Birth/Sex: Oct 23, 1930 (86 y.o. F) Treating RN: Cornell Barman Primary Care Giulliana Mcroberts: Emily Filbert Other Clinician: Referring Arjuna Doeden: Emily Filbert Treating Lezette Kitts/Extender: Tito Dine in Treatment: 15 Active Inactive Necrotic Tissue Nursing Diagnoses: Impaired tissue integrity related to necrotic/devitalized tissue Knowledge deficit related to management of necrotic/devitalized tissue Goals: Necrotic/devitalized tissue will be minimized in  the wound bed Date Initiated: 04/20/2019 Target Resolution Date: 05/04/2019 Goal Status: Active Interventions: Assess patient pain level pre-, during and post procedure and prior to discharge Provide education on necrotic tissue and debridement process Treatment Activities: Apply topical anesthetic as ordered : 04/20/2019 Notes: Venous Leg Ulcer Nursing Diagnoses: Actual venous Insuffiency (use after diagnosis is confirmed) Goals: Patient/caregiver will verbalize understanding of disease process and disease management Date Initiated: 04/20/2019 Target Resolution Date: 05/04/2019 Goal Status: Active Interventions: Assess peripheral edema status every visit. Notes: Wound/Skin Impairment Nursing Diagnoses: Impaired tissue integrity Goals: Patient/caregiver will verbalize understanding of skin care regimen Date Initiated: 04/20/2019 Target Resolution Date: 05/04/2019 Goal Status: Active Ulcer/skin breakdown will have a volume reduction of 30% by week 4 Date Initiated: 04/20/2019 Target Resolution Date: 05/18/2019 Goal Status: Active Interventions: Assess ulceration(s) every visit Treatment Activities: Skin care regimen initiated : 04/20/2019 Topical wound management initiated : 04/20/2019 [name], [name] ([id number]) Notes: Electronic Signature(s) Signed: 12/07/2019 4:16:31 PM By: Grover Canavan Signed: 12/07/2019 4:56:00 PM By: Gretta Cool, BSN, RN, CWS, Kim RN, BSN Entered By: Grover Canavan on 12/07/2019 11:24:46 [name], [name] ([id number]) -------------------------------------------------------------------------------- Pain Assessment Details Patient Name: [name] Date of Service: 12/07/2019 11:00 AM Medical Record Number: [id number] Patient Account Number: [id number] Date of Birth/Sex: 22-Aug-1930 (86 y.o. F) Treating RN: Cornell Barman Primary Care Park Beck: Emily Filbert Other Clinician: Referring Danicka Hourihan: Emily Filbert Treating Haleemah Buckalew/Extender: Tito Dine in  Treatment: 37 Active Problems Location of Pain Severity and Description of Pain Patient Has Paino No Site Locations With Dressing Change: No Pain Management and Medication Current Pain Management: Electronic Signature(s) Signed: 12/07/2019 2:14:25 PM By: Darci Needle Signed: 12/07/2019 4:56:00 PM By: Gretta Cool, BSN, RN, CWS, Kim RN, BSN Entered By: Darci Needle on 12/07/2019 11:01:31 Tawni [name], [name] ([id number]) -------------------------------------------------------------------------------- Patient/Caregiver Education Details Patient Name: [name] Date of Service: 12/07/2019 11:00 AM Medical Record Number: [id number] Patient Account Number: [id number] Date of Birth/Gender: 13-Dec-1930 (86 y.o. F) Treating RN: Grover Canavan Primary Care Physician: Emily Filbert Other Clinician: Referring Physician: Emily Filbert Treating Physician/Extender: Tito Dine in Treatment: 88 Education Assessment Education Provided To: Patient Education Topics Provided Wound/Skin Impairment: Handouts: Skin Care Do's and Dont's Methods: Explain/Verbal Responses: State content correctly Electronic Signature(s) Signed: 12/07/2019 4:16:31 PM By: Grover Canavan Entered By: Grover Canavan on 12/07/2019 11:30:03 [name], [name] ([id number]) -------------------------------------------------------------------------------- Wound Assessment Details Patient Name: [name] Date of Service: 12/07/2019 11:00 AM Medical Record Number: [id number] Patient Account Number: [id number] Date of Birth/Sex:  11/29/1930 (86 y.o. F) Treating RN: Cornell Barman Primary Care Ayomide Purdy: Emily Filbert Other Clinician: Referring Mirinda Monte: Emily Filbert Treating Keelin Neville/Extender: Tito Dine in Treatment: 33 Wound Status Wound Number: 2 Primary Cellulitis Etiology: Wound Location: Right, Dorsal Foot Wound Open Wounding Event: Trauma Status: Date Acquired: 03/24/2019 Comorbid Cataracts, Arrhythmia,  Congestive Heart Failure, Weeks Of Treatment: 33 History: Hypertension, Type II Diabetes, Rheumatoid Arthritis Clustered Wound: No Photos Wound Measurements Length: (cm) 1 Width: (cm) 0.5 Depth: (cm) 0.1 Area: (cm) 0.393 Volume: (cm) 0.039 % Reduction in Area: 94% % Reduction in Volume: 94.1% Epithelialization: Medium (34-66%) Wound Description Classification: Full Thickness Without Exposed Support Structures Wound Margin: Thickened Exudate Amount: None Present Foul Odor After Cleansing: No Slough/Fibrino No Wound Bed Granulation Amount: None Present (0%) Exposed Structure Necrotic Amount: Large (67-100%) Fascia Exposed: No Necrotic Quality: Eschar Fat Layer (Subcutaneous Tissue) Exposed: Yes Tendon Exposed: No Muscle Exposed: No Joint Exposed: No Bone Exposed: No Treatment Notes Wound #2 (Right, Dorsal Foot) Notes BFD to wound, change 3 x weekly Electronic Signature(s) Signed: 12/07/2019 2:59:16 PM By: [name] Signed: 12/07/2019 4:56:00 PM By: Gretta Cool, BSN, RN, CWS, Kim RN, BSN [name], [name] ([id number]) Entered By: [name] on 12/07/2019 11:18:36 [name], [name] ([id number]) -------------------------------------------------------------------------------- Vitals Details Patient Name: [name] Date of Service: 12/07/2019 11:00 AM Medical Record Number: [id number] Patient Account Number: [id number] Date of Birth/Sex: May 12, 1931 (86 y.o. F) Treating RN: Cornell Barman Primary Care Thiago Ragsdale: Emily Filbert Other Clinician: Referring John Williamsen: Emily Filbert Treating Alieyah Spader/Extender: Tito Dine in Treatment: 14 Vital Signs Time Taken: 10:50 Temperature (F): 98.4 Height (in): 69 Pulse (bpm): 57 Weight (lbs): 195 Respiratory Rate (breaths/min): 16 Body Mass Index (BMI): 28.8 Blood Pressure (mmHg): 153/82 Reference Range: 80 - 120 mg / dl Electronic Signature(s) Signed: 12/07/2019 2:14:25 PM By: Darci Needle Entered By: Darci Needle on  12/07/2019 11:01:12

## 2020-01-04 ENCOUNTER — Encounter: Payer: Medicare Other | Attending: Internal Medicine | Admitting: Internal Medicine

## 2020-01-04 ENCOUNTER — Other Ambulatory Visit: Payer: Self-pay

## 2020-01-04 DIAGNOSIS — Z8616 Personal history of COVID-19: Secondary | ICD-10-CM | POA: Diagnosis not present

## 2020-01-04 DIAGNOSIS — Z09 Encounter for follow-up examination after completed treatment for conditions other than malignant neoplasm: Secondary | ICD-10-CM | POA: Diagnosis present

## 2020-01-04 DIAGNOSIS — E1151 Type 2 diabetes mellitus with diabetic peripheral angiopathy without gangrene: Secondary | ICD-10-CM | POA: Diagnosis not present

## 2020-01-04 DIAGNOSIS — Z8631 Personal history of diabetic foot ulcer: Secondary | ICD-10-CM | POA: Insufficient documentation

## 2020-01-04 DIAGNOSIS — E1142 Type 2 diabetes mellitus with diabetic polyneuropathy: Secondary | ICD-10-CM | POA: Diagnosis not present

## 2020-01-05 NOTE — Progress Notes (Signed)
Langston, Adiel ([id number]) Visit Report for 01/04/2020 HPI Details Patient Name: [name], [name] [facility] Date of Service: 01/04/2020 11:00 AM Medical Record Number: [id number] Patient Account Number: [id number] Date of Birth/Sex: Apr 09, 1931 (86 y.o. F) Treating RN: Cornell Barman Primary Care Provider: Emily Filbert Other Clinician: Referring Provider: Emily Filbert Treating Provider/Extender: Tito Dine in Treatment: 46 History of Present Illness HPI Description: ADMISSION 04/20/2019 This is an 86 year old woman who lives in the independent part of [place]. She is a type II diabetic on oral agents and insulin with a recent hemoglobin A1c of 6.3 on 10/6. She was initially seen by primary care on 11/12 after dropping a heavy walker on her bilateral dorsal feet. She had blisters on the right and left lateral foot. She was given doxycycline. She was seen the next day in [facility] on 11/13 continued on the.C. Bilateral foot x-rays were negative. She was admitted to hospital from 11/17 through 11/27 with acute kidney injury I did not research this although the patient tells me that at discharge her kidney function had returned to normal. During the course of this hospitalization it was noted she had wounds on her bilateral feet and her peripheral pulses were nonpalpable. She was evaluated by Dr. Delana Meyer. It was recommended that she consider angiography on the right leg and it was unlikely that she would heal the right leg as her ABI here was 0.48 with monophasic waveforms on the left her ABI was 1.19 with biphasic waveforms. One would wonder if the ABI was artificially elevated on the left. In any case Dr. Cydney Ok note accurately describes the patient's current state of mind. She does not want to rush into an intervention and she is here for our review of her wounds. Noteworthy that when she arrived I think her renal insufficiency was felt to be prerenal. Her GFR on arrival was 22 now 36. Past  medical history includes type 2 diabetes with peripheral neuropathy, atrial fibrillation, renal insufficiency We did not calculate ABIs in our clinic this was 0.48 on the right and 1.19 on the left as noted above. Monophasic waveforms on the right biphasic on the left 12/16; patient admitted to the clinic last week with trauma critical limb ischemia on the right. Today her wound on the left dorsal foot is healed. She still has the black eschar on the dorsal right foot that we have been using Santyl on. There is apparently some drainage. She arrives in clinic today with more swelling on the right great and the left dorsal surrounding her foot wounds. Some erythema especially on the right. There is some tenderness on the right but not as much as I might expect if this was all infection nevertheless he does have some neuropathy which might be blunting discomfort. She reports no additional trauma. She is at the rehab section of [facility], she lives in the independent part of [place]. She tells me she has some pain in the right leg at night which wakes her up from sleeping although she manages to get back to sleep. She has about 150 foot exercise tolerance and then gets fatigue in her legs. Again I discussed the issue of angiogram with the patient especially on the right. She just will not agree to this. She states she wants the rehab and then go to [facility] where a list of her specialists. She was on Levaquin last week we did not prescribe this but she is finished. She describes some nausea and loose stools but does  not sound dramatically unwell with this Left dorsal foot had some very superficial areas today I think related to swelling. The big problem is on the dorsal right foot black surface eschar with increased swelling. She does not have any pain or tenderness although she has neuropathy. We have been using Santyl. She has PAD and again I think needs an angiogram. 12/30; 2-week follow-up. The areas  on the right dorsal foot. Everything is healed on the left. She is still in the rehab part of [place]. She went to see Dr. Lucky Cowboy on 12/29. He reiterated to her he felt that she needed angiography on the right leg for critical limb ischemia. She seemed to have 2 major concerns that is wanting to see a nephrologist prior to procedure in consultation. She does not currently have a nephrologist. She also had no confidence in the original arterial studies that were done in the hospital and wanted them redone. I do not think Dr. Lucky Cowboy felt that they needed to be redone and wanted to proceed with angiography. At the end of it he simply stated that he would be glad to do the angiogram at any time the patient agreed to proceed I looked briefly at her kidney function which had normalized by the time she left the hospital in late November. I do not know that the patient has any additional concern at this point other than the reduction in GFR associated with her diabetes and normal aging. 1/27; I have not seen this patient in almost 4 weeks. She was apparently isolated in the [facility] facility because of COVID-19. She tells me she was mildly symptomatic but feels better now although she still feels like she is in a "fog" as far as her mental abilities. She states that she is up and walking for short distances with a walker. She would like to go back to her independent apartment. Still using Santyl to the wound The patient is not describing a lot of pain. She does not describe claudication but her activity sounds very limited especially when she was under isolation. She is still reluctant to go forward with the angiogram that was suggested. Currently she wants to recover fully from Covid infection 2/10; 2-week follow-up. Patient arrives with 100% nonviable surface over the wound on her right dorsal foot. I changed her to [facility] from [place] last week in terms of surface the wound is deteriorated although I  think the surface area is about the same. She is not describing claudication walking about 50 feet The patient tells me she is trying to get in with her primary doctor however there is restrictions with regards to the pandemic. She wants to be referred to nephrology and then to vascular surgery at [facility] I cannot help her with this. I did tell her that her creatinine had normalized via the last lab work I could see done at [facility] in November and her estimated GFR was 53 at that time. By my estimation careful application of contrast dye and preangiogram hydration should be able to get her through the necessary angiography with no complication however she simply does not want to hear this and she is going to put this off until she can get through the [place] system 2/24; 2-week follow-up wound surface area is about the same certainly not worse. She has 100% slough cover we have been using Soyla Dryer, Clatie ([id number]) 3/3; 2-week follow-up. The patient's wound is essentially unchanged. The surface has slough on it no  debridement it certainly does not look perfused. She is worried about the erythema she is having in her foot. She had a cramp in her leg last night but is not complaining of any other type of pain. Is also is concerned about some edema that is not well corrected by the diuretic she is on 3/16; 2-week follow-up. The wound is unchanged in appearance and in size. The patient has 100% nonviable surface. She saw Dr. Bonnee Quin on 07/21/2019 vascular at [facility]. His statement was "chronic limb threatening ischemia". She was actually planned for a right leg arteriogram and intervention today however she states this was postponed and they are supposed to call her this afternoon to reschedule. Her dressing is being changed at [facility] 08/04/2019 upon evaluation today patient has had her evaluation with vascular at [facility]. Subsequently she unfortunately does not have good arterial flow into her  bilateral lower extremities. She actually is doing worse on the left as compared to the right but neither is effectively receiving good blood flow from the heart down into the lower extremity. She tells me that she actually has her dressing changed on a regular basis at [facility]. She is scheduled apparently to have intervention tomorrow in regard to her right lower extremity and then they will proceed from there in regard to the left depend on how things go. 4/14; the patient was admitted to [facility] from 3/26 through 08/07/2019. She underwent an aortogram ultimately underwent a right popliteal orbital atherectomy and angioplasty, a right anterior tibial artery orbital atherectomy and angioplasty and a right dorsalis pedis artery angioplasty. She was kept in hospital for hydration secondary to a mild bump in her creatinine. I see she was back in the vascular clinic complaining of fatigue but this was not felt to be secondary to procedure which was not done under general anesthesia. She was apparently using Medihoney, the nurse at [facility] is changing the dressing 4/21; the patient has been back to see vascular surgery at [facility] yesterday. She had an improvement in her right arterial studies which were initially done on 3/11. Although her ABI initially was 0.83 on 3/11 yesterday at 0.74 there was a fairly marked improvement in her TBI from 0.13-0.52. The patient is not complaining of any pain. We have been placing Iodoflex on the wound under kerlix Coban 4/28; patient arrived in clinic with been using Iodoflex. Still requiring debridement but the wound is measuring smaller 5/5; still using Iodoflex kerlix Coban. Dimensions slightly smaller 5/12 wound is slightly smaller and the surface looks much better. We have been using Iodoflex with kerlix Coban. The patient is not having any pain around the wound She is describing some cramping in her leg at night which seems to be mostly in her upper thigh. She  states its not every night and it does not seem severe. I am unable to pick up any claudication with activity. I looked through care everywhere. I cannot see her actual postoperative noninvasive studies but I can see them referenced in her surgeon's notes. On 4/20 her ABI was 0.74 TBI of 0.52 with biphasic waveforms on the right. On the left an ABI of 0.62 and 0.48 TBI with monophasic and biphasic waveform 5/19; the wound continues to make nice progress. Healthy granulation. Changed to [facility] 6/2; continued improvement in surface area. Healthy granulation she does have some eschar around the wound circumference although I do not think this appears to be inhibiting healing. She is using Hydrofera Blue. Dressing  being changed at the [facility] facility where she is a resident 6/16; 2-week follow-up. Using Hydrofera Blue. Continued improvement in surface area. 6/30; wound exam; area on the foot continues to get smaller. Eschar and retained Hydrofera Blue over the area. She is having this changed at 7/14; disappointingly the wound on the dorsal right foot still eschared over. We have been using Hydrofera Blue 7/28; she arrives again with reasonably thick eschar over the wound surface. We have been using polymen Ag. 8/25; almost 1 month follow-up now. I brought her back in to have a look at the foot. The area on the dorsal foot is closed. She has not had a follow-up with vascular surgery that I can see. Electronic Signature(s) Signed: 01/05/2020 4:51:35 PM By: Linton Ham MD Entered By: Linton Ham on 01/04/2020 11:39:31 Mcduffee, Everli ([id number]) -------------------------------------------------------------------------------- Physical Exam Details Patient Name: [name] Date of Service: 01/04/2020 11:00 AM Medical Record Number: [id number] Patient Account Number: [id number] Date of Birth/Sex: January 13, 1931 (86 y.o. F) Treating RN: Cornell Barman Primary Care Provider: Emily Filbert Other  Clinician: Referring Provider: Emily Filbert Treating Provider/Extender: Tito Dine in Treatment: 64 Constitutional Patient is hypertensive.. Pulse regular and within target range for patient.Marland Kitchen Respirations regular, non-labored and within target range.. Temperature is normal and within the target range for the patient.Marland Kitchen appears in no distress. Notes Wound exam; the area on the right dorsal foot is 100% closed. Minimal callus on the top of this but certainly no evidence of an open wound. Electronic Signature(s) Signed: 01/05/2020 4:51:35 PM By: Linton Ham MD Entered By: Linton Ham on 01/04/2020 11:40:20 Koeppen, Orli ([id number]) -------------------------------------------------------------------------------- Physician Orders Details Patient Name: [name] Date of Service: 01/04/2020 11:00 AM Medical Record Number: [id number] Patient Account Number: [id number] Date of Birth/Sex: Dec 28, 1930 (86 y.o. F) Treating RN: Cornell Barman Primary Care Provider: Emily Filbert Other Clinician: Referring Provider: Emily Filbert Treating Provider/Extender: Tito Dine in Treatment: 87 Verbal / Phone Orders: No Diagnosis Coding Discharge From [facility] Services o Discharge from [facility] - treatment complete Electronic Signature(s) Signed: 01/04/2020 5:45:00 PM By: Gretta Cool, BSN, RN, CWS, Kim RN, BSN Signed: 01/05/2020 4:51:35 PM By: Linton Ham MD Entered By: Gretta Cool, BSN, RN, CWS, Kim on 01/04/2020 11:18:29 [name], [place] ([id number]) -------------------------------------------------------------------------------- Problem List Details Patient Name: [name] Date of Service: 01/04/2020 11:00 AM Medical Record Number: [id number] Patient Account Number: [id number] Date of Birth/Sex: [birth year] (86 y.o. F) Treating RN: Cornell Barman Primary Care Provider: Emily Filbert Other Clinician: Referring Provider: Emily Filbert Treating Provider/Extender: Tito Dine  in Treatment: 37 Active Problems ICD-10 Encounter Code Description Active Date MDM Diagnosis E11.52 Type 2 diabetes mellitus with diabetic peripheral angiopathy with 04/20/2019 No Yes gangrene L97.518 Non-pressure chronic ulcer of other part of right foot with other specified 04/20/2019 No Yes severity E11.42 Type 2 diabetes mellitus with diabetic polyneuropathy 04/20/2019 No Yes E11.621 Type 2 diabetes mellitus with foot ulcer 04/20/2019 No Yes Inactive Problems ICD-10 Code Description Active Date Inactive Date L97.528 Non-pressure chronic ulcer of other part of left foot with other specified 04/20/2019 04/20/2019 severity Resolved Problems Electronic Signature(s) Signed: 01/05/2020 4:51:35 PM By: Linton Ham MD Entered By: Linton Ham on 01/04/2020 11:38:27 Maramec, [place] ([id number]) -------------------------------------------------------------------------------- Progress Note Details Patient Name: [name] Date of Service: 01/04/2020 11:00 AM Medical Record Number: [id number] Patient Account Number: [id number] Date of Birth/Sex: 1931/02/07 (86 y.o. F) Treating RN: Cornell Barman Primary Care Provider: Emily Filbert Other Clinician: Referring Provider: Emily Filbert Treating Provider/Extender: Ricard Dillon  Weeks in Treatment: 37 Subjective History of Present Illness (HPI) ADMISSION 04/20/2019 This is an 86 year old woman who lives in the independent part of [place]. She is a type II diabetic on oral agents and insulin with a recent hemoglobin A1c of 6.3 on 10/6. She was initially seen by primary care on 11/12 after dropping a heavy walker on her bilateral dorsal feet. She had blisters on the right and left lateral foot. She was given doxycycline. She was seen the next day in [place] clinic on 11/13 continued on the.C. Bilateral foot x-rays were negative. She was admitted to hospital from 11/17 through 11/27 with acute kidney injury I did not research this although the  patient tells me that at discharge her kidney function had returned to normal. During the course of this hospitalization it was noted she had wounds on her bilateral feet and her peripheral pulses were nonpalpable. She was evaluated by Dr. Delana Meyer. It was recommended that she consider angiography on the right leg and it was unlikely that she would heal the right leg as her ABI here was 0.48 with monophasic waveforms on the left her ABI was 1.19 with biphasic waveforms. One would wonder if the ABI was artificially elevated on the left. In any case Dr. Cydney Ok note accurately describes the patient's current state of mind. She does not want to rush into an intervention and she is here for our review of her wounds. Noteworthy that when she arrived I think her renal insufficiency was felt to be prerenal. Her GFR on arrival was 22 now 36. Past medical history includes type 2 diabetes with peripheral neuropathy, atrial fibrillation, renal insufficiency We did not calculate ABIs in our clinic this was 0.48 on the right and 1.19 on the left as noted above. Monophasic waveforms on the right biphasic on the left 12/16; patient admitted to the clinic last week with trauma critical limb ischemia on the right. Today her wound on the left dorsal foot is healed. She still has the black eschar on the dorsal right foot that we have been using Santyl on. There is apparently some drainage. She arrives in clinic today with more swelling on the right great and the left dorsal surrounding her foot wounds. Some erythema especially on the right. There is some tenderness on the right but not as much as I might expect if this was all infection nevertheless he does have some neuropathy which might be blunting discomfort. She reports no additional trauma. She is at the rehab section of [facility], she lives in the independent part of [place]. She tells me she has some pain in the right leg at night which wakes her up from  sleeping although she manages to get back to sleep. She has about 150 foot exercise tolerance and then gets fatigue in her legs. Again I discussed the issue of angiogram with the patient especially on the right. She just will not agree to this. She states she wants the rehab and then go to [facility] where a list of her specialists. She was on Levaquin last week we did not prescribe this but she is finished. She describes some nausea and loose stools but does not sound dramatically unwell with this Left dorsal foot had some very superficial areas today I think related to swelling. The big problem is on the dorsal right foot black surface eschar with increased swelling. She does not have any pain or tenderness although she has neuropathy. We have been using Santyl. She  has PAD and again I think needs an angiogram. 12/30; 2-week follow-up. The areas on the right dorsal foot. Everything is healed on the left. She is still in the rehab part of [place]. She went to see Dr. Lucky Cowboy on 12/29. He reiterated to her he felt that she needed angiography on the right leg for critical limb ischemia. She seemed to have 2 major concerns that is wanting to see a nephrologist prior to procedure in consultation. She does not currently have a nephrologist. She also had no confidence in the original arterial studies that were done in the hospital and wanted them redone. I do not think Dr. Lucky Cowboy felt that they needed to be redone and wanted to proceed with angiography. At the end of it he simply stated that he would be glad to do the angiogram at any time the patient agreed to proceed I looked briefly at her kidney function which had normalized by the time she left the hospital in late November. I do not know that the patient has any additional concern at this point other than the reduction in GFR associated with her diabetes and normal aging. 1/27; I have not seen this patient in almost 4 weeks. She was apparently isolated in the  [facility] facility because of COVID-19. She tells me she was mildly symptomatic but feels better now although she still feels like she is in a "fog" as far as her mental abilities. She states that she is up and walking for short distances with a walker. She would like to go back to her independent apartment. Still using Santyl to the wound The patient is not describing a lot of pain. She does not describe claudication but her activity sounds very limited especially when she was under isolation. She is still reluctant to go forward with the angiogram that was suggested. Currently she wants to recover fully from Covid infection 2/10; 2-week follow-up. Patient arrives with 100% nonviable surface over the wound on her right dorsal foot. I changed her to [facility] from [place] last week in terms of surface the wound is deteriorated although I think the surface area is about the same. She is not describing claudication walking about 50 feet The patient tells me she is trying to get in with her primary doctor however there is restrictions with regards to the pandemic. She wants to be referred to nephrology and then to vascular surgery at [facility] I cannot help her with this. I did tell her that her creatinine had normalized via the last lab work I could see done at [facility] in November and her estimated GFR was 53 at that time. By my estimation careful application of contrast dye and preangiogram hydration should be able to get her through the necessary angiography with no complication however she simply does not want to hear this and she is going to put this off until she can get through the [place] system 2/24; 2-week follow-up wound surface area is about the same certainly not worse. She has 100% slough cover we have been using Santyl 3/3; 2-week follow-up. The patient's wound is essentially unchanged. The surface has slough on it no debridement it certainly does not look perfused. She is worried about the  erythema she is having in her foot. She had a cramp in her leg last night but is not complaining of any other type of pain. Is also is concerned about some edema that is not well corrected by the diuretic she is on  3/16; 2-week follow-up. The wound is unchanged in appearance and in size. The patient has 100% nonviable surface. She saw Dr. Reginal Lutes, [place] ([id number]) Gari Crown on 07/21/2019 vascular at [facility]. His statement was "chronic limb threatening ischemia". She was actually planned for a right leg arteriogram and intervention today however she states this was postponed and they are supposed to call her this afternoon to reschedule. Her dressing is being changed at [facility] 08/04/2019 upon evaluation today patient has had her evaluation with vascular at [facility]. Subsequently she unfortunately does not have good arterial flow into her bilateral lower extremities. She actually is doing worse on the left as compared to the right but neither is effectively receiving good blood flow from the heart down into the lower extremity. She tells me that she actually has her dressing changed on a regular basis at [facility]. She is scheduled apparently to have intervention tomorrow in regard to her right lower extremity and then they will proceed from there in regard to the left depend on how things go. 4/14; the patient was admitted to [facility] from 3/26 through 08/07/2019. She underwent an aortogram ultimately underwent a right popliteal orbital atherectomy and angioplasty, a right anterior tibial artery orbital atherectomy and angioplasty and a right dorsalis pedis artery angioplasty. She was kept in hospital for hydration secondary to a mild bump in her creatinine. I see she was back in the vascular clinic complaining of fatigue but this was not felt to be secondary to procedure which was not done under general anesthesia. She was apparently using Medihoney, the nurse at [facility] is changing the  dressing 4/21; the patient has been back to see vascular surgery at [facility] yesterday. She had an improvement in her right arterial studies which were initially done on 3/11. Although her ABI initially was 0.83 on 3/11 yesterday at 0.74 there was a fairly marked improvement in her TBI from 0.13-0.52. The patient is not complaining of any pain. We have been placing Iodoflex on the wound under kerlix Coban 4/28; patient arrived in clinic with been using Iodoflex. Still requiring debridement but the wound is measuring smaller 5/5; still using Iodoflex kerlix Coban. Dimensions slightly smaller 5/12 wound is slightly smaller and the surface looks much better. We have been using Iodoflex with kerlix Coban. The patient is not having any pain around the wound She is describing some cramping in her leg at night which seems to be mostly in her upper thigh. She states its not every night and it does not seem severe. I am unable to pick up any claudication with activity. I looked through care everywhere. I cannot see her actual postoperative noninvasive studies but I can see them referenced in her surgeon's notes. On 4/20 her ABI was 0.74 TBI of 0.52 with biphasic waveforms on the right. On the left an ABI of 0.62 and 0.48 TBI with monophasic and biphasic waveform 5/19; the wound continues to make nice progress. Healthy granulation. Changed to [facility] 6/2; continued improvement in surface area. Healthy granulation she does have some eschar around the wound circumference although I do not think this appears to be inhibiting healing. She is using Hydrofera Blue. Dressing being changed at the [facility] facility where she is a resident 6/16; 2-week follow-up. Using Hydrofera Blue. Continued improvement in surface area. 6/30; wound exam; area on the foot continues to get smaller. Eschar and retained Hydrofera Blue over the area. She is having this changed at 7/14; disappointingly the wound on the  dorsal right  foot still eschared over. We have been using Hydrofera Blue 7/28; she arrives again with reasonably thick eschar over the wound surface. We have been using polymen Ag. 8/25; almost 1 month follow-up now. I brought her back in to have a look at the foot. The area on the dorsal foot is closed. She has not had a follow-up with vascular surgery that I can see. Objective Constitutional Patient is hypertensive.. Pulse regular and within target range for patient.Marland Kitchen Respirations regular, non-labored and within target range.. Temperature is normal and within the target range for the patient.Marland Kitchen appears in no distress. Vitals Time Taken: 11:04 AM, Height: 69 in, Weight: 195 lbs, BMI: 28.8, Temperature: 98.4 F, Pulse: 63 bpm, Respiratory Rate: 16 breaths/min, Blood Pressure: 154/81 mmHg. General Notes: Wound exam; the area on the right dorsal foot is 100% closed. Minimal callus on the top of this but certainly no evidence of an open wound. Integumentary (Hair, Skin) Wound #2 status is Healed - Epithelialized. Original cause of wound was Trauma. The wound is located on the Right,Dorsal Foot. The wound measures 0cm length x 0cm width x 0cm depth; 0cm^2 area and 0cm^3 volume. There is Fat Layer (Subcutaneous Tissue) exposed. There is no tunneling or undermining noted. There is a none present amount of drainage noted. The wound margin is thickened. There is no granulation within the wound bed. There is a small (1-33%) amount of necrotic tissue within the wound bed. 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 This was initially a traumatic wound complicated by ischemia. She has been revascularized. At this point I do not see any need for secondary prevention Electronic Signature(s) Signed: 01/05/2020 4:51:35 PM By: Linton Ham MD Entered By: Linton Ham on 01/04/2020 11:41:08 Mcnamara, Jalaiyah ([id number]) -------------------------------------------------------------------------------- SuperBill Details Patient Name: [name] Date of Service: 01/04/2020 Medical Record Number: [id number] Patient Account Number: [id number] Date of Birth/Sex: [birth year] (86 y.o. F) Treating RN: Cornell Barman Primary Care Provider: Emily Filbert Other Clinician: Referring Provider: Emily Filbert Treating Provider/Extender: Tito Dine in Treatment: 37 Diagnosis Coding ICD-10 Codes Code Description E11.52 Type 2 diabetes mellitus with diabetic peripheral angiopathy with gangrene L97.518 Non-pressure chronic ulcer of other part of right foot with other specified severity E11.42 Type 2 diabetes mellitus with diabetic polyneuropathy E11.621 Type 2 diabetes mellitus with foot ulcer Facility Procedures CPT4 Code: 80321224 Description: [phone] - WOUND CARE VISIT-LEV 2 EST PT Modifier: Quantity: 1 Physician Procedures CPT4 Code: [id number] Description: 91694 - WC PHYS LEVEL 2 - EST PT Modifier: Quantity: 1 CPT4 Code: Description: ICD-10 Diagnosis Description E11.52 Type 2 diabetes mellitus with diabetic peripheral angiopathy with gangre L97.518 Non-pressure chronic ulcer of other part of right foot with other specif Modifier: ne ied severity Quantity: Electronic Signature(s) Signed: 01/05/2020 4:51:35 PM By: Linton Ham MD Entered By: Linton Ham on 01/04/2020 11:41:24

## 2020-01-05 NOTE — Progress Notes (Signed)
[place], [place] ([id number]) Visit Report for 01/04/2020 Arrival Information Details Patient Name: [name], [name] [name] Date of Service: 01/04/2020 11:00 AM Medical Record Number: [id number] Patient Account Number: [id number] Date of Birth/Sex: 19-Jan-1931 (86 y.o. F) Treating RN: Cornell Barman Primary Care Olusegun Gerstenberger: Emily Filbert Other Clinician: Referring Sonal Dorwart: Emily Filbert Treating Marranda Arakelian/Extender: Tito Dine in Treatment: 83 Visit Information History Since Last Visit Added or deleted any medications: No Patient Arrived: Gilford Rile Had a fall or experienced change in No Arrival Time: 11:03 activities of daily living that may affect Accompanied By: self risk of falls: Transfer Assistance: None Hospitalized since last visit: No Patient Has Alerts: Yes Pain Present Now: No Patient Alerts: Patient on Blood Thinner ABI L 1.19 R 0.48 03/2019 2-325mg  Aspirin daily TBI R .13 L .42 Electronic Signature(s) Signed: 01/04/2020 5:45:00 PM By: Gretta Cool, BSN, RN, CWS, Kim RN, BSN Entered By: Gretta Cool, BSN, RN, CWS, Kim on 01/04/2020 11:04:26 [name], [place] ([id number]) -------------------------------------------------------------------------------- Clinic Level of Care Assessment Details Patient Name: [name] Date of Service: 01/04/2020 11:00 AM Medical Record Number: [id number] Patient Account Number: [id number] Date of Birth/Sex: 1931-03-26 (86 y.o. F) Treating RN: Cornell Barman Primary Care Daison Braxton: Emily Filbert Other Clinician: Referring Leilah Polimeni: Emily Filbert Treating Dekayla Prestridge/Extender: Tito Dine in Treatment: 43 Clinic Level of Care Assessment Items TOOL 4 Quantity Score []  - Use when only an EandM is performed on FOLLOW-UP visit 0 ASSESSMENTS - Nursing Assessment / Reassessment X - Reassessment of Co-morbidities (includes updates in patient status) 1 10 X- 1 5 Reassessment of Adherence to Treatment Plan ASSESSMENTS - Wound and Skin Assessment / Reassessment X - Simple  Wound Assessment / Reassessment - one wound 1 5 []  - 0 Complex Wound Assessment / Reassessment - multiple wounds []  - 0 Dermatologic / Skin Assessment (not related to wound area) ASSESSMENTS - Focused Assessment []  - Circumferential Edema Measurements - multi extremities 0 []  - 0 Nutritional Assessment / Counseling / Intervention []  - 0 Lower Extremity Assessment (monofilament, tuning fork, pulses) []  - 0 Peripheral Arterial Disease Assessment (using hand held doppler) ASSESSMENTS - Ostomy and/or Continence Assessment and Care []  - Incontinence Assessment and Management 0 []  - 0 Ostomy Care Assessment and Management (repouching, etc.) PROCESS - Coordination of Care X - Simple Patient / Family Education for ongoing care 1 15 []  - 0 Complex (extensive) Patient / Family Education for ongoing care []  - 0 Staff obtains Programmer, systems, Records, Test Results / Process Orders []  - 0 Staff telephones HHA, Nursing Homes / Clarify orders / etc []  - 0 Routine Transfer to another Facility (non-emergent condition) []  - 0 Routine Hospital Admission (non-emergent condition) []  - 0 New Admissions / Biomedical engineer / Ordering NPWT, Apligraf, etc. []  - 0 Emergency Hospital Admission (emergent condition) X- 1 10 Simple Discharge Coordination []  - 0 Complex (extensive) Discharge Coordination PROCESS - Special Needs []  - Pediatric / Minor Patient Management 0 []  - 0 Isolation Patient Management []  - 0 Hearing / Language / Visual special needs []  - 0 Assessment of Community assistance (transportation, D/C planning, etc.) []  - 0 Additional assistance / Altered mentation []  - 0 Support Surface(s) Assessment (bed, cushion, seat, etc.) INTERVENTIONS - Wound Cleansing / Measurement Montella, Lavella ([id number]) X- 1 5 Simple Wound Cleansing - one wound []  - 0 Complex Wound Cleansing - multiple wounds X- 1 5 Wound Imaging (photographs - any number of wounds) []  - 0 Wound Tracing (instead  of photographs) X- 1 5 Simple Wound Measurement - one wound []  -  0 Complex Wound Measurement - multiple wounds INTERVENTIONS - Wound Dressings []  - Small Wound Dressing one or multiple wounds 0 []  - 0 Medium Wound Dressing one or multiple wounds []  - 0 Large Wound Dressing one or multiple wounds []  - 0 Application of Medications - topical []  - 0 Application of Medications - injection INTERVENTIONS - Miscellaneous []  - External ear exam 0 []  - 0 Specimen Collection (cultures, biopsies, blood, body fluids, etc.) []  - 0 Specimen(s) / Culture(s) sent or taken to Lab for analysis []  - 0 Patient Transfer (multiple staff / Civil Service fast streamer / Similar devices) []  - 0 Simple Staple / Suture removal (25 or less) []  - 0 Complex Staple / Suture removal (26 or more) []  - 0 Hypo / Hyperglycemic Management (close monitor of Blood Glucose) []  - 0 Ankle / Brachial Index (ABI) - do not check if billed separately X- 1 5 Vital Signs Has the patient been seen at the hospital within the last three years: Yes Total Score: 65 Level Of Care: New/Established - Level 2 Electronic Signature(s) Signed: 01/04/2020 5:45:00 PM By: Gretta Cool, BSN, RN, CWS, Kim RN, BSN Entered By: Gretta Cool, BSN, RN, CWS, Kim on 01/04/2020 11:18:50 [name], [place]mer[place] ([id number]) -------------------------------------------------------------------------------- Encounter Discharge Information Details Patient Name: [name] Date of Service: 01/04/2020 11:00 AM Medical Record Number: [id number] Patient Account Number: [id number] Date of Birth/Sex: 06-Jul-1930 (86 y.o. F) Treating RN: Cornell Barman Primary Care Jader Desai: Emily Filbert Other Clinician: Referring Ross Bender: Emily Filbert Treating Taksh Hjort/Extender: Tito Dine in Treatment: 77 Encounter Discharge Information Items Discharge Condition: Stable Ambulatory Status: Ambulatory Discharge Destination: Home Transportation: Private Auto Accompanied By: self Schedule Follow-up  Appointment: Yes Clinical Summary of Care: Electronic Signature(s) Signed: 01/04/2020 5:45:00 PM By: Gretta Cool, BSN, RN, CWS, Kim RN, BSN Entered By: Gretta Cool, BSN, RN, CWS, Kim on 01/04/2020 11:19:55 [name], [place] ([id number]) -------------------------------------------------------------------------------- Lower Extremity Assessment Details Patient Name: [name] Date of Service: 01/04/2020 11:00 AM Medical Record Number: [id number] Patient Account Number: [id number] Date of Birth/Sex: 02-11-1931 (86 y.o. F) Treating RN: Cornell Barman Primary Care Shawan Tosh: Emily Filbert Other Clinician: Referring Jesly Hartmann: Emily Filbert Treating Amante Fomby/Extender: Tito Dine in Treatment: 37 Edema Assessment Assessed: [Left: No] [Right: Yes] Edema: [Left: N] [Right: o] Vascular Assessment Pulses: Dorsalis Pedis Palpable: [Right:Yes] Posterior Tibial Palpable: [Right:Yes] Electronic Signature(s) Signed: 01/04/2020 5:45:00 PM By: Gretta Cool, BSN, RN, CWS, Kim RN, BSN Entered By: Gretta Cool, BSN, RN, CWS, Kim on 01/04/2020 11:13:01 [name], Demetrica [id number]) -------------------------------------------------------------------------------- Multi Wound Chart Details Patient Name: [name] Date of Service: 01/04/2020 11:00 AM Medical Record Number: [id number] Patient Account Number: [id number] Date of Birth/Sex: 1930/08/29 (86 y.o. F) Treating RN: Cornell Barman Primary Care Rhyann Berton: Emily Filbert Other Clinician: Referring Deepika Decatur: Emily Filbert Treating Fortune Brannigan/Extender: Tito Dine in Treatment: 37 Vital Signs Height(in): 52 Pulse(bpm): 80 Weight(lbs): 195 Blood Pressure(mmHg): 154/81 Body Mass Index(BMI): 29 Temperature(F): 98.4 Respiratory Rate(breaths/min): 16 Photos: [N/A:N/A] Wound Location: Right, Dorsal Foot N/A N/A Wounding Event: Trauma N/A N/A Primary Etiology: Cellulitis N/A N/A Comorbid History: Cataracts, Arrhythmia, Congestive N/A N/A Heart Failure, Hypertension, Type  II Diabetes, Rheumatoid Arthritis Date Acquired: 03/24/2019 N/A N/A Weeks of Treatment: 37 N/A N/A Wound Status: Healed - Epithelialized N/A N/A Measurements L x W x D (cm) 0x0x0 N/A N/A Area (cm) : 0 N/A N/A Volume (cm) : 0 N/A N/A % Reduction in Area: 100.00% N/A N/A % Reduction in Volume: 100.00% N/A N/A Classification: Full Thickness Without Exposed N/A N/A Support Structures Exudate Amount: None Present N/A N/A Wound  Margin: Thickened N/A N/A Granulation Amount: None Present (0%) N/A N/A Necrotic Amount: Small (1-33%) N/A N/A Exposed Structures: Fat Layer (Subcutaneous Tissue): N/A N/A Yes Fascia: No Tendon: No Muscle: No Joint: No Bone: No Epithelialization: Large (67-100%) N/A N/A Treatment Notes Electronic Signature(s) Signed: 01/05/2020 4:51:35 PM By: Linton Ham MD Entered By: Linton Ham on 01/04/2020 11:38:55 Wlodarczyk, Dorri ([id number]) -------------------------------------------------------------------------------- Multi-Disciplinary Care Plan Details Patient Name: [name] Date of Service: 01/04/2020 11:00 AM Medical Record Number: [id number] Patient Account Number: [id number] Date of Birth/Sex: 07-Jan-1931 (86 y.o. F) Treating RN: Cornell Barman Primary Care Keenan Trefry: Emily Filbert Other Clinician: Referring Aja Whitehair: Emily Filbert Treating Markeria Goetsch/Extender: Tito Dine in Treatment: 24 Active Inactive Electronic Signature(s) Signed: 01/04/2020 5:45:00 PM By: Gretta Cool, BSN, RN, CWS, Kim RN, BSN Entered By: Gretta Cool, BSN, RN, CWS, Kim on 01/04/2020 11:17:54 [name], Kaysi ([id number]) -------------------------------------------------------------------------------- Pain Assessment Details Patient Name: [name] Date of Service: 01/04/2020 11:00 AM Medical Record Number: [id number] Patient Account Number: [id number] Date of Birth/Sex: Aug 02, 1930 (86 y.o. F) Treating RN: Cornell Barman Primary Care Kobe Jansma: Emily Filbert Other Clinician: Referring  Tamer Baughman: Emily Filbert Treating Norfleet Capers/Extender: Tito Dine in Treatment: 37 Active Problems Location of Pain Severity and Description of Pain Patient Has Paino No Site Locations Pain Management and Medication Current Pain Management: Electronic Signature(s) Signed: 01/04/2020 5:45:00 PM By: Gretta Cool, BSN, RN, CWS, Kim RN, BSN Entered By: Gretta Cool, BSN, RN, CWS, Kim on 01/04/2020 11:07:29 [name], Roseanne ([id number]) -------------------------------------------------------------------------------- Patient/Caregiver Education Details Patient Name: [name] Date of Service: 01/04/2020 11:00 AM Medical Record Number: [id number] Patient Account Number: [id number] Date of Birth/Gender: April 18, 1931 (86 y.o. F) Treating RN: Cornell Barman Primary Care Physician: Emily Filbert Other Clinician: Referring Physician: Emily Filbert Treating Physician/Extender: Tito Dine in Treatment: 51 Education Assessment Education Provided To: Patient Education Topics Provided Wound/Skin Impairment: Handouts: Caring for Your Ulcer Methods: Demonstration, Explain/Verbal Responses: State content correctly Electronic Signature(s) Signed: 01/04/2020 5:45:00 PM By: Gretta Cool, BSN, RN, CWS, Kim RN, BSN Entered By: Gretta Cool, BSN, RN, CWS, Kim on 01/04/2020 11:19:27 [name], Miquela ([id number]) -------------------------------------------------------------------------------- Wound Assessment Details Patient Name: [name] Date of Service: 01/04/2020 11:00 AM Medical Record Number: [id number] Patient Account Number: [id number] Date of Birth/Sex: 07/11/1930 (86 y.o. F) Treating RN: Cornell Barman Primary Care Jermichael Belmares: Emily Filbert Other Clinician: Referring Carmino Ocain: Emily Filbert Treating Johnte Portnoy/Extender: Tito Dine in Treatment: 94 Wound Status Wound Number: 2 Primary Cellulitis Etiology: Wound Location: Right, Dorsal Foot Wound Healed - Epithelialized Wounding Event:  Trauma Status: Date Acquired: 03/24/2019 Comorbid Cataracts, Arrhythmia, Congestive Heart Failure, Weeks Of Treatment: 37 History: Hypertension, Type II Diabetes, Rheumatoid Arthritis Clustered Wound: No Photos Wound Measurements Length: (cm) 0 Width: (cm) 0 Depth: (cm) 0 Area: (cm) 0 Volume: (cm) 0 % Reduction in Area: 100% % Reduction in Volume: 100% Epithelialization: Large (67-100%) Tunneling: No Undermining: No Wound Description Classification: Full Thickness Without Exposed Support Structures Wound Margin: Thickened Exudate Amount: None Present Foul Odor After Cleansing: No Slough/Fibrino No Wound Bed Granulation Amount: None Present (0%) Exposed Structure Necrotic Amount: Small (1-33%) Fascia Exposed: No Fat Layer (Subcutaneous Tissue) Exposed: Yes Tendon Exposed: No Muscle Exposed: No Joint Exposed: No Bone Exposed: No Electronic Signature(s) Signed: 01/04/2020 5:45:00 PM By: Gretta Cool, BSN, RN, CWS, Kim RN, BSN Entered By: Gretta Cool, BSN, RN, CWS, Kim on 01/04/2020 11:17:45 [name], Karema ([id number]) -------------------------------------------------------------------------------- Magnet Details Patient Name: [name] Date of Service: 01/04/2020 11:00 AM Medical Record Number: [id number] Patient Account Number: [id number] Date of Birth/Sex: 01/06/1931 (86 y.o. F) Treating RN:  Cornell Barman Primary Care Hyla Coard: Emily Filbert Other Clinician: Referring Ixel Boehning: Emily Filbert Treating Trashaun Streight/Extender: Tito Dine in Treatment: 37 Vital Signs Time Taken: 11:04 Temperature (F): 98.4 Height (in): 69 Pulse (bpm): 63 Weight (lbs): 195 Respiratory Rate (breaths/min): 16 Body Mass Index (BMI): 28.8 Blood Pressure (mmHg): 154/81 Reference Range: 80 - 120 mg / dl Electronic Signature(s) Signed: 01/04/2020 5:45:00 PM By: Gretta Cool, BSN, RN, CWS, Kim RN, BSN Entered By: Gretta Cool, BSN, RN, CWS, Kim on 01/04/2020 11:07:23

## 2020-04-09 DIAGNOSIS — L03115 Cellulitis of right lower limb: Secondary | ICD-10-CM

## 2020-11-01 DIAGNOSIS — Z789 Other specified health status: Secondary | ICD-10-CM | POA: Insufficient documentation

## 2021-01-25 IMAGING — CR DG LUMBAR SPINE COMPLETE 4+V
1 series · 5 of 5 positions shown · non-contrast
Comparison: 02/12/2015

CLINICAL DATA: Worsening lumbar spine pain.

EXAM:
LUMBAR SPINE - COMPLETE 4+ VIEW

[Series 1: dg lumbar spine complete 4 +v · 0.14mm/px · 5 of 5 slices shown]
[im 1/5]
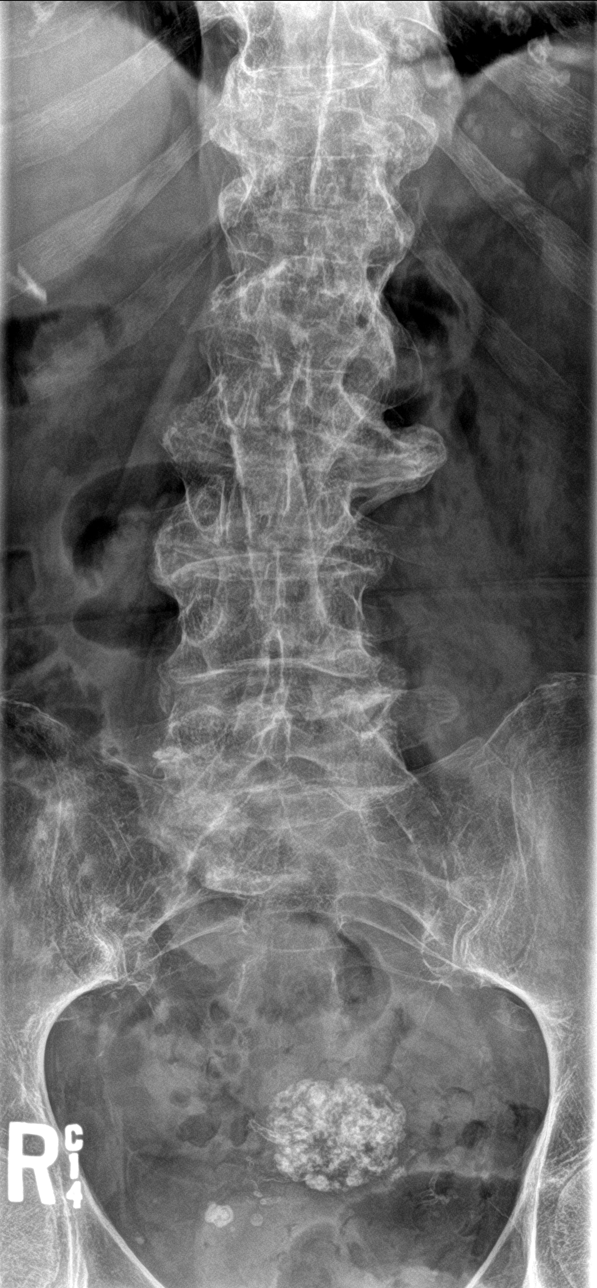
[im 2/5]
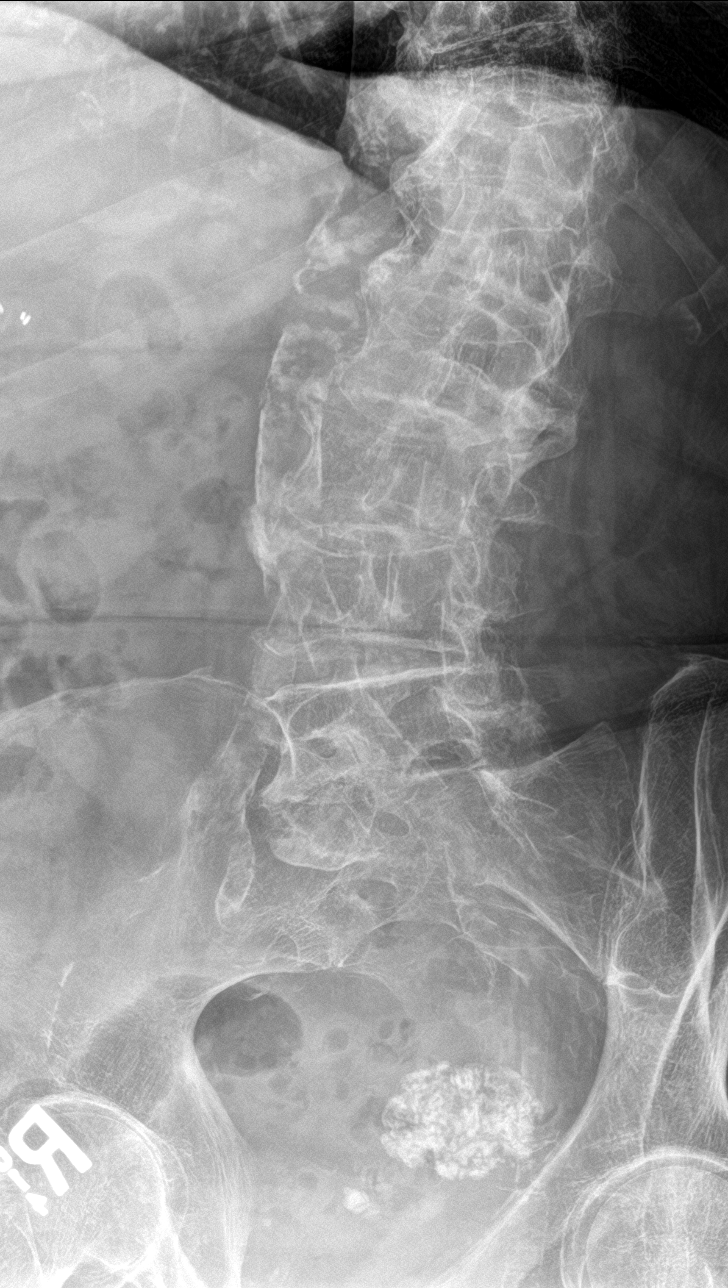
[im 3/5]
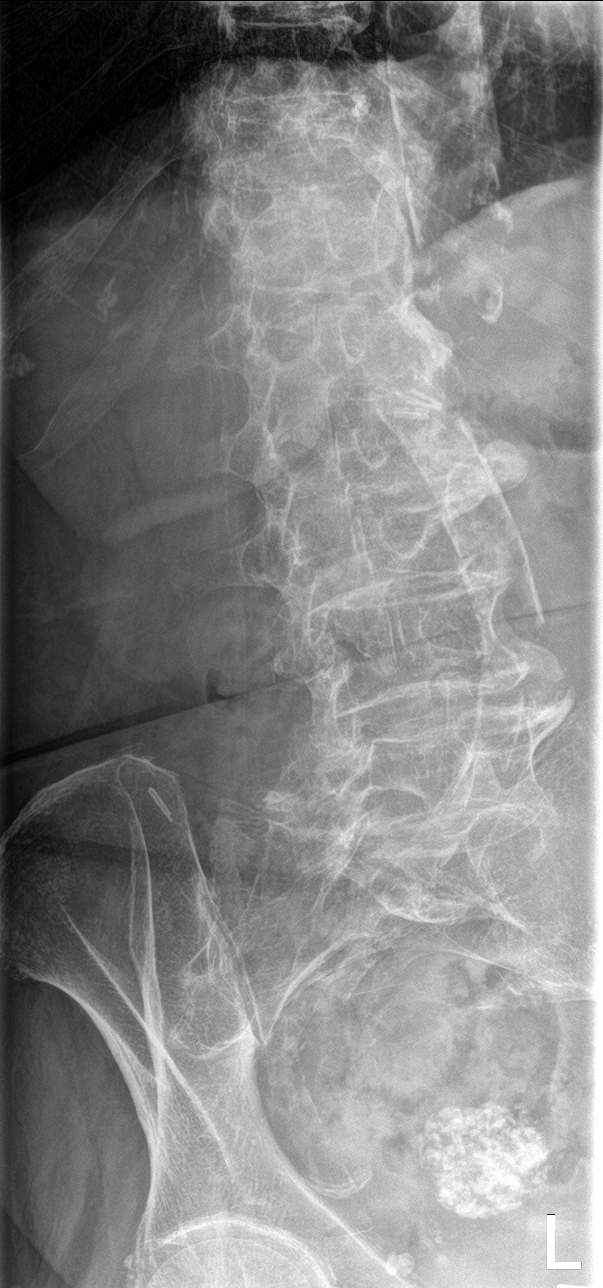
[im 4/5]
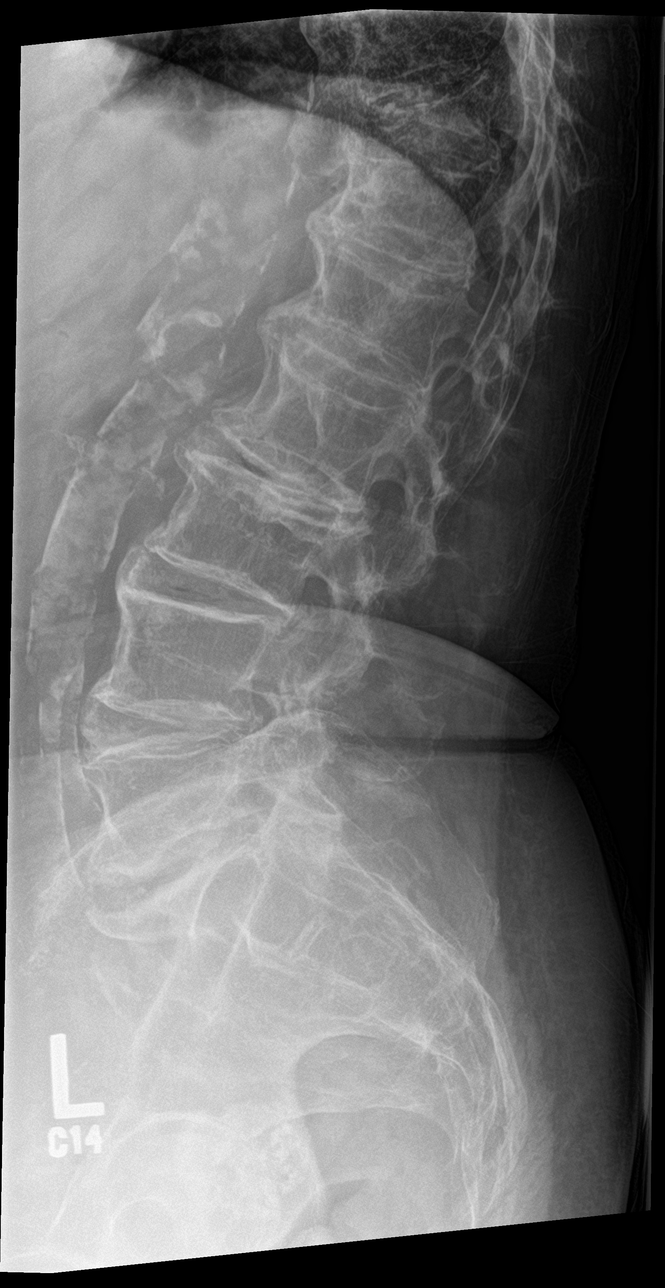
[im 5/5]
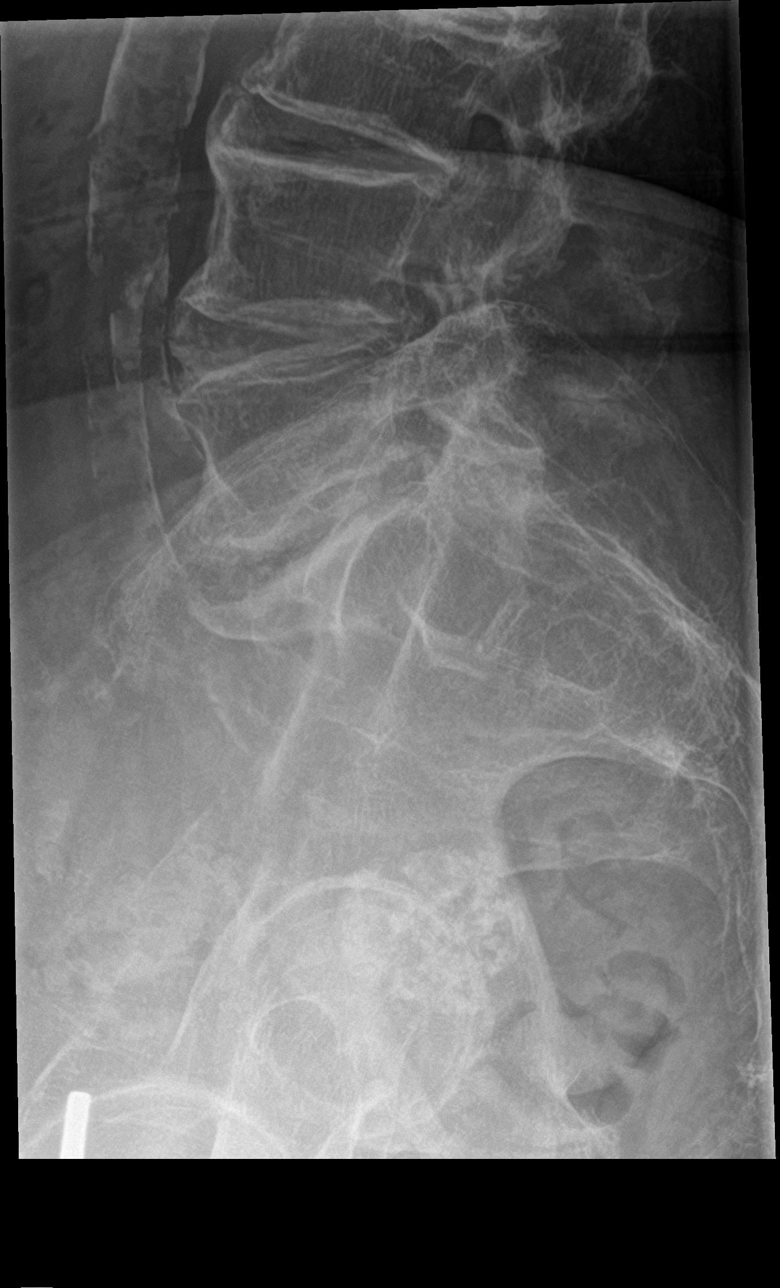

[5 of 5 positions shown; findings below may reference images not displayed]

FINDINGS: Bones are diffusely demineralized. Loss of disc space again noted
T12-L1, L1-2, L2-3 loss of disc height evident L5-S1. Endplate
degeneration noted at multiple levels. SI joints unremarkable.
Atherosclerotic calcification noted in the wall of the abdominal
aorta. Coarse calcification in the central pelvis likely uterine
fibroid.
IMPRESSION: Stable.  No acute bony abnormality.

Diffuse degenerative disc disease in the lumbar spine.

## 2021-12-20 ENCOUNTER — Inpatient Hospital Stay
Admission: EM | Admit: 2021-12-20 | Discharge: 2021-12-25 | DRG: 291 | Disposition: A | Discharge status: Skilled Nursing Facility | Payer: Medicare Other | Source: Skilled Nursing Facility | Attending: Internal Medicine | Admitting: Internal Medicine

## 2021-12-20 ENCOUNTER — Other Ambulatory Visit: Payer: Self-pay

## 2021-12-20 ENCOUNTER — Inpatient Hospital Stay: Payer: Medicare Other

## 2021-12-20 ENCOUNTER — Emergency Department: Payer: Medicare Other

## 2021-12-20 DIAGNOSIS — I872 Venous insufficiency (chronic) (peripheral): Secondary | ICD-10-CM | POA: Diagnosis present

## 2021-12-20 DIAGNOSIS — Z794 Long term (current) use of insulin: Secondary | ICD-10-CM | POA: Diagnosis not present

## 2021-12-20 DIAGNOSIS — Z888 Allergy status to other drugs, medicaments and biological substances status: Secondary | ICD-10-CM | POA: Diagnosis not present

## 2021-12-20 DIAGNOSIS — I2510 Atherosclerotic heart disease of native coronary artery without angina pectoris: Secondary | ICD-10-CM | POA: Diagnosis present

## 2021-12-20 DIAGNOSIS — Z86011 Personal history of benign neoplasm of the brain: Secondary | ICD-10-CM | POA: Diagnosis not present

## 2021-12-20 DIAGNOSIS — Z7982 Long term (current) use of aspirin: Secondary | ICD-10-CM

## 2021-12-20 DIAGNOSIS — Z7984 Long term (current) use of oral hypoglycemic drugs: Secondary | ICD-10-CM | POA: Diagnosis not present

## 2021-12-20 DIAGNOSIS — E870 Hyperosmolality and hypernatremia: Secondary | ICD-10-CM | POA: Diagnosis present

## 2021-12-20 DIAGNOSIS — I5031 Acute diastolic (congestive) heart failure: Secondary | ICD-10-CM | POA: Diagnosis not present

## 2021-12-20 DIAGNOSIS — I5033 Acute on chronic diastolic (congestive) heart failure: Secondary | ICD-10-CM | POA: Diagnosis present

## 2021-12-20 DIAGNOSIS — I10 Essential (primary) hypertension: Secondary | ICD-10-CM | POA: Diagnosis not present

## 2021-12-20 DIAGNOSIS — I4891 Unspecified atrial fibrillation: Secondary | ICD-10-CM | POA: Diagnosis present

## 2021-12-20 DIAGNOSIS — L03116 Cellulitis of left lower limb: Secondary | ICD-10-CM | POA: Diagnosis present

## 2021-12-20 DIAGNOSIS — N1831 Chronic kidney disease, stage 3a: Secondary | ICD-10-CM | POA: Diagnosis present

## 2021-12-20 DIAGNOSIS — Z66 Do not resuscitate: Secondary | ICD-10-CM | POA: Diagnosis not present

## 2021-12-20 DIAGNOSIS — Z801 Family history of malignant neoplasm of trachea, bronchus and lung: Secondary | ICD-10-CM | POA: Diagnosis not present

## 2021-12-20 DIAGNOSIS — N179 Acute kidney failure, unspecified: Secondary | ICD-10-CM | POA: Diagnosis present

## 2021-12-20 DIAGNOSIS — Z20822 Contact with and (suspected) exposure to covid-19: Secondary | ICD-10-CM | POA: Diagnosis present

## 2021-12-20 DIAGNOSIS — E1129 Type 2 diabetes mellitus with other diabetic kidney complication: Secondary | ICD-10-CM | POA: Diagnosis present

## 2021-12-20 DIAGNOSIS — I4819 Other persistent atrial fibrillation: Secondary | ICD-10-CM | POA: Diagnosis present

## 2021-12-20 DIAGNOSIS — E1122 Type 2 diabetes mellitus with diabetic chronic kidney disease: Secondary | ICD-10-CM | POA: Diagnosis present

## 2021-12-20 DIAGNOSIS — R7989 Other specified abnormal findings of blood chemistry: Secondary | ICD-10-CM | POA: Diagnosis not present

## 2021-12-20 DIAGNOSIS — Z823 Family history of stroke: Secondary | ICD-10-CM | POA: Diagnosis not present

## 2021-12-20 DIAGNOSIS — I130 Hypertensive heart and chronic kidney disease with heart failure and stage 1 through stage 4 chronic kidney disease, or unspecified chronic kidney disease: Principal | ICD-10-CM | POA: Diagnosis present

## 2021-12-20 DIAGNOSIS — Z79899 Other long term (current) drug therapy: Secondary | ICD-10-CM | POA: Diagnosis not present

## 2021-12-20 DIAGNOSIS — I5A Non-ischemic myocardial injury (non-traumatic): Secondary | ICD-10-CM | POA: Diagnosis present

## 2021-12-20 DIAGNOSIS — Z85820 Personal history of malignant melanoma of skin: Secondary | ICD-10-CM | POA: Diagnosis not present

## 2021-12-20 DIAGNOSIS — R0602 Shortness of breath: Secondary | ICD-10-CM | POA: Diagnosis present

## 2021-12-20 DIAGNOSIS — E1151 Type 2 diabetes mellitus with diabetic peripheral angiopathy without gangrene: Secondary | ICD-10-CM | POA: Diagnosis present

## 2021-12-20 DIAGNOSIS — R600 Localized edema: Secondary | ICD-10-CM

## 2021-12-20 LAB — CBC
HCT: 39.5 % (ref 36.0–46.0)
Hemoglobin: 12.3 g/dL (ref 12.0–15.0)
MCH: 29.7 pg (ref 26.0–34.0)
MCHC: 31.1 g/dL (ref 30.0–36.0)
MCV: 95.4 fL (ref 80.0–100.0)
Platelets: 234 10*3/uL (ref 150–400)
RBC: 4.14 MIL/uL (ref 3.87–5.11)
RDW: 17.2 % — ABNORMAL HIGH (ref 11.5–15.5)
WBC: 8.6 10*3/uL (ref 4.0–10.5)
nRBC: 0 % (ref 0.0–0.2)

## 2021-12-20 LAB — BASIC METABOLIC PANEL
Anion gap: 11 (ref 5–15)
BUN: 61 mg/dL — ABNORMAL HIGH (ref 8–23)
CO2: 31 mmol/L (ref 22–32)
Calcium: 9.6 mg/dL (ref 8.9–10.3)
Chloride: 105 mmol/L (ref 98–111)
Creatinine, Ser: 1.47 mg/dL — ABNORMAL HIGH (ref 0.44–1.00)
GFR, Estimated: 34 mL/min — ABNORMAL LOW (ref >60)
Glucose, Bld: 129 mg/dL — ABNORMAL HIGH (ref 70–99)
Potassium: 4 mmol/L (ref 3.5–5.1)
Sodium: 147 mmol/L — ABNORMAL HIGH (ref 135–145)

## 2021-12-20 LAB — BRAIN NATRIURETIC PEPTIDE: B Natriuretic Peptide: 732 pg/mL — ABNORMAL HIGH (ref 0.0–100.0)

## 2021-12-20 LAB — LIPID PANEL
Cholesterol: 188 mg/dL (ref 0–200)
HDL: 66 mg/dL (ref >40)
LDL Cholesterol: 106 mg/dL — ABNORMAL HIGH (ref 0–99)
Total CHOL/HDL Ratio: 2.8 RATIO
Triglycerides: 78 mg/dL (ref <150)
VLDL: 16 mg/dL (ref 0–40)

## 2021-12-20 LAB — LACTIC ACID, PLASMA
Lactic Acid, Venous: 2.6 mmol/L (ref 0.5–1.9)
Lactic Acid, Venous: 1.4 mmol/L (ref 0.5–1.9)

## 2021-12-20 LAB — SEDIMENTATION RATE: Sed Rate: 10 mm/hr (ref 0–30)

## 2021-12-20 LAB — TROPONIN I (HIGH SENSITIVITY)
Troponin I (High Sensitivity): 34 ng/L — ABNORMAL HIGH (ref <18)
Troponin I (High Sensitivity): 33 ng/L — ABNORMAL HIGH (ref <18)
Troponin I (High Sensitivity): 30 ng/L — ABNORMAL HIGH (ref <18)

## 2021-12-20 LAB — SARS CORONAVIRUS 2 BY RT PCR: SARS Coronavirus 2 by RT PCR: NEGATIVE

## 2021-12-20 LAB — GLUCOSE, CAPILLARY: Glucose-Capillary: 218 mg/dL — ABNORMAL HIGH (ref 70–99)

## 2021-12-20 MED ORDER — ENOXAPARIN SODIUM 40 MG/0.4ML IJ SOSY
40.0000 mg | PREFILLED_SYRINGE | INTRAMUSCULAR | Status: DC
  Administered 2021-12-21: 40 mg via SUBCUTANEOUS
  Filled 2021-12-20: qty 0.4

## 2021-12-20 MED ORDER — FUROSEMIDE 10 MG/ML IJ SOLN: 40.0000 mg | Freq: Once | INTRAMUSCULAR | Status: DC

## 2021-12-20 MED ORDER — ACETAMINOPHEN 325 MG PO TABS
650.0000 mg | ORAL_TABLET | Freq: Four times a day (QID) | ORAL | Status: DC | PRN
  Administered 2021-12-21 – 2021-12-25 (×10): 650 mg via ORAL
  Filled 2021-12-20 (×10): qty 2

## 2021-12-20 MED ORDER — METOPROLOL TARTRATE 25 MG PO TABS
25.0000 mg | ORAL_TABLET | Freq: Two times a day (BID) | ORAL | Status: DC
  Administered 2021-12-20 – 2021-12-25 (×9): 25 mg via ORAL
  Filled 2021-12-20 (×11): qty 1

## 2021-12-20 MED ORDER — ONDANSETRON HCL 4 MG/2ML IJ SOLN: 4.0000 mg | Freq: Three times a day (TID) | INTRAMUSCULAR | Status: DC | PRN

## 2021-12-20 MED ORDER — FUROSEMIDE 10 MG/ML IJ SOLN
40.0000 mg | Freq: Two times a day (BID) | INTRAMUSCULAR | Status: DC
  Administered 2021-12-20 – 2021-12-25 (×10): 40 mg via INTRAVENOUS
  Filled 2021-12-20 (×10): qty 4

## 2021-12-20 MED ORDER — ASPIRIN 325 MG PO TBEC
325.0000 mg | DELAYED_RELEASE_TABLET | Freq: Every day | ORAL | Status: DC
  Administered 2021-12-20 – 2021-12-25 (×6): 325 mg via ORAL
  Filled 2021-12-20 (×6): qty 1

## 2021-12-20 MED ORDER — INSULIN ASPART 100 UNIT/ML IJ SOLN
0.0000 [IU] | Freq: Three times a day (TID) | INTRAMUSCULAR | Status: DC
  Administered 2021-12-21: 5 [IU] via SUBCUTANEOUS
  Administered 2021-12-21: 2 [IU] via SUBCUTANEOUS
  Administered 2021-12-22: 5 [IU] via SUBCUTANEOUS
  Administered 2021-12-22: 3 [IU] via SUBCUTANEOUS
  Administered 2021-12-22 – 2021-12-23 (×3): 2 [IU] via SUBCUTANEOUS
  Administered 2021-12-23: 3 [IU] via SUBCUTANEOUS
  Administered 2021-12-24: 5 [IU] via SUBCUTANEOUS
  Administered 2021-12-24: 2 [IU] via SUBCUTANEOUS
  Administered 2021-12-25: 9 [IU] via SUBCUTANEOUS
  Administered 2021-12-25: 2 [IU] via SUBCUTANEOUS
  Filled 2021-12-20 (×11): qty 1

## 2021-12-20 MED ORDER — INSULIN ASPART 100 UNIT/ML IJ SOLN
0.0000 [IU] | Freq: Every day | INTRAMUSCULAR | Status: DC
  Administered 2021-12-23 – 2021-12-24 (×2): 2 [IU] via SUBCUTANEOUS
  Filled 2021-12-20 (×4): qty 1

## 2021-12-20 MED ORDER — SODIUM CHLORIDE 0.9 % IV SOLN
1.0000 g | Freq: Once | INTRAVENOUS | Status: AC
  Administered 2021-12-20: 1 g via INTRAVENOUS
  Filled 2021-12-20: qty 10

## 2021-12-20 MED ORDER — SODIUM CHLORIDE 0.9 % IV SOLN
1.0000 g | INTRAVENOUS | Status: DC
  Administered 2021-12-21 – 2021-12-24 (×4): 1 g via INTRAVENOUS
  Filled 2021-12-20 (×2): qty 10
  Filled 2021-12-20: qty 1
  Filled 2021-12-20 (×2): qty 10

## 2021-12-20 MED ORDER — INSULIN GLARGINE-YFGN 100 UNIT/ML ~~LOC~~ SOLN
4.0000 [IU] | Freq: Two times a day (BID) | SUBCUTANEOUS | Status: DC
  Administered 2021-12-21 (×2): 4 [IU] via SUBCUTANEOUS
  Filled 2021-12-20 (×4): qty 0.04

## 2021-12-20 MED ORDER — AMLODIPINE BESYLATE 5 MG PO TABS
5.0000 mg | ORAL_TABLET | Freq: Once | ORAL | Status: AC
  Administered 2021-12-20: 5 mg via ORAL
  Filled 2021-12-20: qty 1

## 2021-12-20 MED ORDER — AMLODIPINE BESYLATE 10 MG PO TABS
10.0000 mg | ORAL_TABLET | Freq: Every day | ORAL | Status: DC
  Administered 2021-12-21 – 2021-12-25 (×4): 10 mg via ORAL
  Filled 2021-12-20 (×5): qty 1

## 2021-12-20 MED ORDER — HYDRALAZINE HCL 20 MG/ML IJ SOLN
5.0000 mg | INTRAMUSCULAR | Status: DC | PRN
Start: 2021-12-20 — End: 2021-12-25
  Filled 2021-12-20: qty 1

## 2021-12-20 NOTE — Assessment & Plan Note (Addendum)
Patient is not septic. - Empiric antimicrobial treatment with Rocephin - Blood cultures x 2 -preliminary results were negative - ESR and CRP-within normal limit - wound care consult - f/u LE venous Doppler to rule out DVT-negative for DVT  

## 2021-12-20 NOTE — Assessment & Plan Note (Signed)
-   Hold losartan -Monitor renal function by BMP

## 2021-12-20 NOTE — Assessment & Plan Note (Signed)
Patient is not taking anticoagulants currently.  Heart rate well controlled -Continue metoprolol

## 2021-12-20 NOTE — Assessment & Plan Note (Signed)
2D echo on 08/02/2020 showed EF> 55%.  Patient has shortness breath, 2+ bilateral leg edema, positive JVD, fine crackles on auscultation, elevated BNP 732, clinically consistent with CHF exacerbation. Echo done-pending results -Continue Lasix 40 mg bid by IV -Daily weights -strict I/O's -Low salt diet -Fluid restriction -Obtain REDs Vest reading

## 2021-12-20 NOTE — ED Provider Notes (Signed)
[facility] Provider Note    Event Date/Time   First MD Initiated Contact with Patient 12/20/21 1530     (approximate)   History   Shortness of Breath   HPI  [name] is a 88 y.o. female with a history of heart failure on torsemide as well as CKD presents to the ER for evaluation of worsening leg swelling shortness of breath blisters and redness and discomfort to her bilateral lower extremities but left greater than right.  She is not on any anticoagulation.  States that she been compliant with her medications.  Denies any chest pain or pressure at this time.     Physical Exam   Triage Vital Signs: ED Triage Vitals  Enc Vitals Group     BP 12/20/21 1409 (!) 185/168     Pulse Rate 12/20/21 1409 67     Resp 12/20/21 1409 18     Temp 12/20/21 1409 98.3 F (36.8 C)     Temp Source 12/20/21 1409 Oral     SpO2 12/20/21 1409 95 %     Weight --      Height --      Head Circumference --      Peak Flow --      Pain Score 12/20/21 1410 10     Pain Loc --      Pain Edu? --      Excl. in [place]? --     Most recent vital signs: Vitals:   12/20/21 1409  BP: (!) 185/168  Pulse: 67  Resp: 18  Temp: 98.3 F (36.8 C)  SpO2: 95%     Constitutional: Alert  Eyes: Conjunctivae are normal.  Head: Atraumatic. Nose: No congestion/rhinnorhea. Mouth/Throat: Mucous membranes are moist.   Neck: Painless ROM.  Cardiovascular:   Good peripheral circulation. Respiratory: Normal respiratory effort.  No retractions.  Gastrointestinal: Soft and nontender.  Musculoskeletal: 3+ bilateral pitting edema left greater than right large serous filled blister with streaking cellulitis of the left lower extremity.  No ulcerations on the feet does have chronic venous stasis ulcers to bilateral leg. Neurologic:  MAE spontaneously. No gross focal neurologic deficits are appreciated.  Skin:  Skin is warm, dry and intact. No rash noted. Psychiatric: Mood and affect are normal.  Speech and behavior are normal.    ED Results / Procedures / Treatments   Labs (all labs ordered are listed, but only abnormal results are displayed) Labs Reviewed  BASIC METABOLIC PANEL - Abnormal; Notable for the following components:      Result Value   Sodium 147 (*)    Glucose, Bld 129 (*)    BUN 61 (*)    Creatinine, Ser 1.47 (*)    GFR, Estimated 34 (*)    All other components within normal limits  CBC - Abnormal; Notable for the following components:   RDW 17.2 (*)    All other components within normal limits  LACTIC ACID, PLASMA - Abnormal; Notable for the following components:   Lactic Acid, Venous 2.6 (*)    All other components within normal limits  BRAIN NATRIURETIC PEPTIDE - Abnormal; Notable for the following components:   B Natriuretic Peptide 732.0 (*)    All other components within normal limits  TROPONIN I (HIGH SENSITIVITY) - Abnormal; Notable for the following components:   Troponin I (High Sensitivity) 34 (*)    All other components within normal limits  SARS CORONAVIRUS 2 BY RT PCR  CULTURE, BLOOD (ROUTINE X  2)  CULTURE, BLOOD (ROUTINE X 2)  LACTIC ACID, PLASMA  TROPONIN I (HIGH SENSITIVITY)     EKG  ED ECG REPORT I, Merlyn Lot, the attending physician, personally viewed and interpreted this ECG.   Date: 12/20/2021  EKG Time: 14:16  Rate: 60  Rhythm: afib  Axis: normal  Intervals: right  ST&T Change: nonspecific st and t wave abn, no stemi    RADIOLOGY Please see ED Course for my review and interpretation.  I personally reviewed all radiographic images ordered to evaluate for the above acute complaints and reviewed radiology reports and findings.  These findings were personally discussed with the patient.  Please see medical record for radiology report.    PROCEDURES:  Critical Care performed: No  Procedures   MEDICATIONS ORDERED IN ED: Medications  cefTRIAXone (ROCEPHIN) 1 g in sodium chloride 0.9 % 100 mL IVPB (has no  administration in time range)  amLODipine (NORVASC) tablet 5 mg (has no administration in time range)     IMPRESSION / MDM / ASSESSMENT AND PLAN / ED COURSE  I reviewed the triage vital signs and the nursing notes.                              Differential diagnosis includes, but is not limited to, edema, cellulitis, DVT, CHF exacerbation, anasarca, venous stasis  She presented to the ER for evaluation of symptoms as described above.  This presenting complaint could reflect a potentially life-threatening illness therefore the patient will be placed on continuous pulse oximetry and telemetry for monitoring.  Laboratory evaluation will be sent to evaluate for the above complaints.      Clinical Course as of 12/20/21 1643  Fri Dec 20, 2021  1530 Chest x-ray on my review and interpretation shows evidence of small bilateral effusions and cardiomegaly. [PR]  1642 Given her presentation I discussed case with hospitalist who agrees admit patient.  Requested holding off on diuresis until they evaluate to make further determination on care plan going forward.  She is in no respiratory distress at this time. [PR]    Clinical Course User Index [PR] Merlyn Lot, MD   FINAL CLINICAL IMPRESSION(S) / ED DIAGNOSES   Final diagnoses:  Cellulitis of left lower extremity     Rx / DC Orders   ED Discharge Orders     None        Note:  This document was prepared using Dragon voice recognition software and may include unintentional dictation errors.    Merlyn Lot, MD 12/20/21 [phone]

## 2021-12-20 NOTE — ED Notes (Signed)
RN aware of bed assignment 

## 2021-12-20 NOTE — ED Triage Notes (Addendum)
Pt to ED via [facility]. When pt asked chief complaint, pt accusing this RN of yelling. Furthermore, pt concerned with asking for water and this RN trying to explain pt needs to be seen by MD first depending on scans to be ordered.   Pt finally disclosing that she is SOB and has increased bilateral leg swelling and weeping. Pt has large fluid blister on LE.   Pt placed in recliner in triage per request and able to stand and pivot.

## 2021-12-20 NOTE — Assessment & Plan Note (Signed)
CAD and myocardial injury due to CHF exacerbation: Troponin level 34, no chest pain. -Continue aspirin -Patient is allergic to statin -Trend troponin -Check A1c, FLP

## 2021-12-20 NOTE — Assessment & Plan Note (Signed)
-   IV hydralazine as needed -Hold losartan due to worsening renal function -Increase amlodipine dose from 5 to 10 mg daily -Continue metoprolol -Patient is on IV Lasix

## 2021-12-20 NOTE — ED Notes (Signed)
Patient refused to go to CT at this time until she is given names of all staff members. When patient was given a list of names, she was upset there were no last names wrote down.   Director asked to come speak to patient.   Patient also reports we are ignoring her care, although patient refused CT at this time

## 2021-12-20 NOTE — ED Notes (Signed)
Patient cleansed. Linens and gown changed due to voiding of urine. Pt declines new placement of brief d/t unwillingness to roll or lift bottom. Purewick placed with chux pad between legs.

## 2021-12-20 NOTE — Assessment & Plan Note (Signed)
Lactic acid 2.6, patient is not septic.  No fever or leukocytosis.  No tachycardia or tachypnea. -Trend lactic acid -Will not give IV fluid due to CHF exacerbation

## 2021-12-20 NOTE — Assessment & Plan Note (Signed)
See above

## 2021-12-20 NOTE — Assessment & Plan Note (Signed)
Recent A1c 6.3, well controlled.  Patient is a taking metformin and Levemir 5 units twice daily -Glargine insulin 4 units twice daily -Sliding scale insulin

## 2021-12-20 NOTE — ED Notes (Signed)
This NT and NT Megan to transport pt from subwait to room 11. Pt confrontational with staff at this time. Pt stated she needed to use the bathroom. Took pt to the room bathroom where she was non compliant with instructions from both techs to maintain safety. Once on the toilet yelled at staff. When getting off of toilet pt non compliant again with NT instructions to maintain safety. This NT Grabbed under arm to assit and pt elbowed this NT in the stomach multiple times and was told to "get off of her". Pt also stated that both NT in the room were "angry and hateful" and "if this is nursing, this is disappointing." Pt continued to be non compliant with transfer to bed.

## 2021-12-20 NOTE — ED Notes (Signed)
EDT attempting to collect blood without success. Pt refusing to let EDT or this RN attempt second stick.

## 2021-12-20 NOTE — H&P (Signed)
History and Physical    [name] KMM:[id number] DOB: 10-Dec-1930 DOA: 12/20/2021  Referring MD/NP/PA:   PCP: Rusty Aus, MD   Patient coming from:  The patient is coming from independent living facility  Chief Complaint: SOB, bilateral leg swelling and left leg erythema and pain  HPI: [name] is a 88 y.o. female with medical history significant of dCHF, hypertension, diabetes mellitus, meningioma in the left frontal area, atrial fibrillation not on anticoagulants, CAD, CKD stage IIIa, PAD, who presents with shortness of breath, bilateral leg swelling, left leg erythema and pain.  Patient states that she has shortness of breath which has been progressively worsening the past several days.  Patient does not have cough, chest pain, fever or chills.  She complains of generalized weakness.  She also has worsening bilateral leg edema. Her left lower leg is erythematous with a big blister.  Patient does not have nausea, vomiting, diarrhea or abdominal pain.  No symptoms of UTI  Data reviewed independently and ED Course: pt was found to have BNP 732, troponin level 34, lactic acid 2.6, worsening renal function with creatinine 1.47, BUN 61 (baseline creatinine 0.97 on 04/07/2019), temperature normal, blood pressure 185/168, heart rate 67, RR 18, oxygen saturation 95% on room air.  Chest x-ray showed cardiomegaly, bilateral small pleural effusion, with possible age-indeterminate T-spine compression fracture.  Patient is admitted to telemetry bed as inpatient.  CXR:  1. Cardiomegaly. 2. Compression fracture of a lower thoracic vertebral body with associated kyphosis, age indeterminate. 3. Small bilateral pleural effusions. 4. Nodularity projected over the lateral left chest favored to represent healed rib fractures.  EKG: I have personally reviewed.  Atrial fibrillation, QTc 390, RAD, low voltage, incomplete right bundle blockade.   Review of Systems:   General: no fevers, chills, no  body weight gain, has fatigue HEENT: no blurry vision, hearing changes or sore throat Respiratory: has dyspnea, no coughing, wheezing CV: no chest pain, no palpitations GI: no nausea, vomiting, abdominal pain, diarrhea, constipation GU: no dysuria, burning on urination, increased urinary frequency, hematuria  Ext: has leg edema Neuro: no unilateral weakness, numbness, or tingling, no vision change or hearing loss Skin: no rash, no skin tear. MSK: No muscle spasm, no deformity, no limitation of range of movement in spin Heme: No easy bruising.  Travel history: No recent long distant travel.   Allergy:  Allergies  Allergen Reactions   Alendronate Other (See Comments)    Cramping of the jaw   Iodinated Contrast Media Other (See Comments)    Brother has allergy and she wants not to receive it    Statins Other (See Comments)    Fatigue/muscle weakness Fatigue/muscle weakness Fatigue/muscle weakness    Past Medical History:  Diagnosis Date   Atrial fibrillation ([place])    Coronary artery disease    Diabetes mellitus without complication ([place])    Hypertension    Meningioma ([place])    Left frontal lobe    Past Surgical History:  Procedure Laterality Date   APPENDECTOMY     CHOLECYSTECTOMY     MELANOMA REMOVAL Right     Social History:  reports that she has never smoked. She has never used smokeless tobacco. She reports current alcohol use of about 1.0 standard drink of alcohol per week. She reports that she does not use drugs.  Family History:  Family History  Problem Relation Age of Onset   Stroke Brother    Lung cancer Brother      Prior to Admission  medications   Medication Sig Start Date End Date Taking? Authorizing Provider  amLODipine (NORVASC) 5 MG tablet Take 1 tablet (5 mg total) by mouth daily. 04/09/19   Samuella Cota, MD  aspirin 325 MG EC tablet Take 325 mg by mouth 2 (two) times daily.     [provider]  furosemide (LASIX) 20 MG tablet   04/26/19   [provider]  Insulin Detemir (LEVEMIR FLEXPEN) 100 UNIT/ML Pen Inject 5-10 Units into the skin 2 (two) times daily. 5 units QAM 10 units QPM    [provider]  LOSARTAN POTASSIUM PO Take 100 mg by mouth daily.    [provider]  metFORMIN (GLUCOPHAGE) 1000 MG tablet 1,000 mg 2 (two) times daily with a meal.  04/13/15   [provider]  metoprolol tartrate (LOPRESSOR) 25 MG tablet Take 12.5 mg by mouth 2 (two) times daily.    [provider]  saccharomyces boulardii (FLORASTOR) 250 MG capsule Take 250 mg by mouth daily.    [provider]  spironolactone (ALDACTONE) 25 MG tablet  04/23/19   [provider]    Physical Exam: Vitals:   12/20/21 1409 12/20/21 1739  BP: (!) 185/168 (!) 166/66  Pulse: 67 69  Resp: 18 16  Temp: 98.3 F (36.8 C) 97.7 F (36.5 C)  TempSrc: Oral Oral  SpO2: 95% 98%   General: Not in acute distress HEENT:       Eyes: PERRL, EOMI, no scleral icterus.       ENT: No discharge from the ears and nose, no pharynx injection, no tonsillar enlargement.        Neck: positive JVD, no bruit, no mass felt. Heme: No neck lymph node enlargement. Cardiac: S1/S2, RRR, irregularly irregular rhythm, no gallops or rubs. Respiratory: has fine crackles bilaterally GI: Soft, nondistended, nontender, no rebound pain, no organomegaly, BS present. GU: No hematuria Ext: has 2+ bilateral lower leg edema, left lower leg is erythematous, tender on palpation, with a big blister     Musculoskeletal: No joint deformities, No joint redness or warmth, no limitation of ROM in spin. Skin: No rashes.  Neuro: Alert, oriented X3, cranial nerves II-XII grossly intact, moves all extremities normally.  Psych: Patient is not psychotic, no suicidal or hemocidal ideation.  Labs on Admission: I have personally reviewed following labs and imaging studies  CBC: Recent Labs  Lab 12/20/21 1418  WBC 8.6  HGB 12.3   HCT 39.5  MCV 95.4  PLT 517   Basic Metabolic Panel: Recent Labs  Lab 12/20/21 1418  NA 147*  K 4.0  CL 105  CO2 31  GLUCOSE 129*  BUN 61*  CREATININE 1.47*  CALCIUM 9.6   GFR: CrCl cannot be calculated (Unknown ideal weight.). Liver Function Tests: No results for input(s): "AST", "ALT", "ALKPHOS", "BILITOT", "PROT", "ALBUMIN" in the last 168 hours. No results for input(s): "LIPASE", "AMYLASE" in the last 168 hours. No results for input(s): "AMMONIA" in the last 168 hours. Coagulation Profile: No results for input(s): "INR", "PROTIME" in the last 168 hours. Cardiac Enzymes: No results for input(s): "CKTOTAL", "CKMB", "CKMBINDEX", "TROPONINI" in the last 168 hours. BNP (last 3 results) No results for input(s): "PROBNP" in the last 8760 hours. HbA1C: No results for input(s): "HGBA1C" in the last 72 hours. CBG: No results for input(s): "GLUCAP" in the last 168 hours. Lipid Profile: No results for input(s): "CHOL", "HDL", "LDLCALC", "TRIG", "CHOLHDL", "LDLDIRECT" in the last 72 hours. Thyroid Function Tests: No results for  input(s): "TSH", "T4TOTAL", "FREET4", "T3FREE", "THYROIDAB" in the last 72 hours. Anemia Panel: No results for input(s): "VITAMINB12", "FOLATE", "FERRITIN", "TIBC", "IRON", "RETICCTPCT" in the last 72 hours. Urine analysis: No results found for: "COLORURINE", "APPEARANCEUR", "LABSPEC", "PHURINE", "GLUCOSEU", "HGBUR", "BILIRUBINUR", "KETONESUR", "PROTEINUR", "UROBILINOGEN", "NITRITE", "LEUKOCYTESUR" Sepsis Labs: '@LABRCNTIP' (procalcitonin:4,lacticidven:4) )No results found for this or any previous visit (from the past 240 hour(s)).   Radiological Exams on Admission: DG Chest 2 View  Result Date: 12/20/2021 CLINICAL DATA:  Shortness of breath.  Bilateral leg swelling. EXAM: CHEST - 2 VIEW COMPARISON:  None Available. FINDINGS: Cardiomegaly. The hila and mediastinum are unremarkable. No pneumothorax. Compression fracture of a lower thoracic vertebral body  with associated kyphosis. Small bilateral pleural effusions. Nodularity projected over the lateral left chest favored to represent healed rib fractures. No other acute abnormalities. IMPRESSION: 1. Cardiomegaly. 2. Compression fracture of a lower thoracic vertebral body with associated kyphosis, age indeterminate. 3. Small bilateral pleural effusions. 4. Nodularity projected over the lateral left chest favored to represent healed rib fractures. Electronically Signed   By: Dorise Bullion III M.D.   On: 12/20/2021 15:38      Assessment/Plan Principal Problem:   Acute on chronic diastolic CHF (congestive heart failure) (HCC) Active Problems:   Coronary artery disease   Myocardial injury   Essential (primary) hypertension   Persistent atrial fibrillation (HCC)   Type II diabetes mellitus with renal manifestations (HCC)   Acute renal failure superimposed on stage 3a chronic kidney disease (HCC)   Elevated lactic acid level   Cellulitis of left lower extremity   Assessment and Plan: * Acute on chronic diastolic CHF (congestive heart failure) ([place]) 2D echo on 08/02/2020 showed EF> 55%.  Patient has shortness breath, 2+ bilateral leg edema, positive JVD, fine crackles on auscultation, elevated BNP 732, clinically consistent with CHF exacerbation.  - will admit to tele bed as inpatient -Lasix 40 mg bid by IV -2d echo -Daily weights -strict I/O's -Low salt diet -Fluid restriction -Obtain REDs Vest reading   Coronary artery disease CAD and myocardial injury due to CHF exacerbation: Troponin level 34, no chest pain. -Continue aspirin -Patient is allergic to statin -Trend troponin -Check A1c, FLP  Myocardial injury - See above  Essential (primary) hypertension - IV hydralazine as needed -Hold losartan due to worsening renal function -Increase amlodipine dose from 5 to 10 mg daily -Continue metoprolol -Patient is on IV Lasix  Persistent atrial fibrillation ([place]) Patient is not  taking anticoagulants currently.  Heart rate 67 -Continue metoprolol  Type II diabetes mellitus with renal manifestations (HCC) Recent A1c 6.3, well controlled.  Patient is a taking metformin and Levemir 5 units twice daily -Glargine insulin 4 units twice daily -Sliding scale insulin  Acute renal failure superimposed on stage 3a chronic kidney disease (HCC) - Hold losartan -Monitor renal function by BMP  Elevated lactic acid level Lactic acid 2.6, patient is not septic.  No fever or leukocytosis.  No tachycardia or tachypnea. -Trend lactic acid -Will not give IV fluid due to CHF exacerbation  Cellulitis of left lower extremity Patient is not septic. - Empiric antimicrobial treatment with Rocephin - Blood cultures x 2  - ESR and CRP - wound care consult - f/u LE venous Doppler to rule out DVT       DVT ppx: SQ Lovenox  Code Status: DNR (I discussed with patient and explained the meaning of CODE STATUS in the presence of nurse. Patient wants to be DNR).  His son is aware of this decision  by patient  Family Communication:  Yes, patient's son by phone  Disposition Plan:  Anticipate discharge back to previous environment, independent living facility  Consults called:  none  Admission status and Level of care: Telemetry Cardiac  as inpt       Severity of Illness:  The appropriate patient status for this patient is INPATIENT. Inpatient status is judged to be reasonable and necessary in order to provide the required intensity of service to ensure the patient's safety. The patient's presenting symptoms, physical exam findings, and initial radiographic and laboratory data in the context of their chronic comorbidities is felt to place them at high risk for further clinical deterioration. Furthermore, it is not anticipated that the patient will be medically stable for discharge from the hospital within 2 midnights of admission.   * I certify that at the point of admission it is my  clinical judgment that the patient will require inpatient hospital care spanning beyond 2 midnights from the point of admission due to high intensity of service, high risk for further deterioration and high frequency of surveillance required.*       Date of Service 12/20/2021    Ivor Costa [facility]   If 7PM-7AM, please contact night-coverage [email or web address] 12/20/2021, 5:47 PM

## 2021-12-20 NOTE — ED Provider Triage Note (Signed)
Emergency Medicine Provider Triage Evaluation Note  [name] , a 88 y.o. female  was evaluated in triage.  Pt complains of swelling and blisters on her lower legs with increase in fatigue. No chest pain but has some shortness of breath.  Physical Exam  There were no vitals taken for this visit. Gen:   Awake, no distress   Resp:  Normal effort  MSK:   Moves extremities with difficulty  Other:  Large, fluid filled blister left lower extremity.  Medical Decision Making  Medically screening exam initiated at 2:05 PM.  Appropriate orders placed.  [name] was informed that the remainder of the evaluation will be completed by another provider, this initial triage assessment does not replace that evaluation, and the importance of remaining in the ED until their evaluation is complete.    Victorino Dike, FNP 12/20/21 1409

## 2021-12-21 ENCOUNTER — Inpatient Hospital Stay (HOSPITAL_COMMUNITY)
Admit: 2021-12-21 | Discharge: 2021-12-21 | Disposition: A | Discharge status: Home / Self Care | Payer: Medicare Other | Attending: Internal Medicine | Admitting: Internal Medicine

## 2021-12-21 DIAGNOSIS — I5031 Acute diastolic (congestive) heart failure: Secondary | ICD-10-CM | POA: Diagnosis not present

## 2021-12-21 DIAGNOSIS — I5033 Acute on chronic diastolic (congestive) heart failure: Secondary | ICD-10-CM | POA: Diagnosis not present

## 2021-12-21 LAB — BASIC METABOLIC PANEL
Anion gap: 10 (ref 5–15)
BUN: 56 mg/dL — ABNORMAL HIGH (ref 8–23)
CO2: 32 mmol/L (ref 22–32)
Calcium: 8.8 mg/dL — ABNORMAL LOW (ref 8.9–10.3)
Chloride: 102 mmol/L (ref 98–111)
Creatinine, Ser: 1.47 mg/dL — ABNORMAL HIGH (ref 0.44–1.00)
GFR, Estimated: 34 mL/min — ABNORMAL LOW (ref >60)
Glucose, Bld: 188 mg/dL — ABNORMAL HIGH (ref 70–99)
Potassium: 3.9 mmol/L (ref 3.5–5.1)
Sodium: 144 mmol/L (ref 135–145)

## 2021-12-21 LAB — ECHOCARDIOGRAM COMPLETE
AR max vel: 0.67 cm2
AV Area VTI: 0.79 cm2
AV Area mean vel: 0.7 cm2
AV Mean grad: 8 mmHg
AV Peak grad: 14.9 mmHg
Ao pk vel: 1.93 m/s
Area-P 1/2: 3.42 cm2
Calc EF: 59.7 %
Height: 64.5 in
Single Plane A2C EF: 62.1 %
Single Plane A4C EF: 58.9 %
Weight: 3188.73 oz

## 2021-12-21 LAB — CBC
HCT: 33.5 % — ABNORMAL LOW (ref 36.0–46.0)
Hemoglobin: 10.6 g/dL — ABNORMAL LOW (ref 12.0–15.0)
MCH: 29 pg (ref 26.0–34.0)
MCHC: 31.6 g/dL (ref 30.0–36.0)
MCV: 91.8 fL (ref 80.0–100.0)
Platelets: 204 10*3/uL (ref 150–400)
RBC: 3.65 MIL/uL — ABNORMAL LOW (ref 3.87–5.11)
RDW: 17.1 % — ABNORMAL HIGH (ref 11.5–15.5)
WBC: 7.7 10*3/uL (ref 4.0–10.5)
nRBC: 0 % (ref 0.0–0.2)

## 2021-12-21 LAB — GLUCOSE, CAPILLARY
Glucose-Capillary: 170 mg/dL — ABNORMAL HIGH (ref 70–99)
Glucose-Capillary: 275 mg/dL — ABNORMAL HIGH (ref 70–99)
Glucose-Capillary: 205 mg/dL — ABNORMAL HIGH (ref 70–99)

## 2021-12-21 LAB — C-REACTIVE PROTEIN: CRP: 0.8 mg/dL (ref <1.0)

## 2021-12-21 LAB — MAGNESIUM: Magnesium: 1.9 mg/dL (ref 1.7–2.4)

## 2021-12-21 LAB — HEMOGLOBIN A1C
Hgb A1c MFr Bld: 7.3 % — ABNORMAL HIGH (ref 4.8–5.6)
Mean Plasma Glucose: 162.81 mg/dL

## 2021-12-21 NOTE — Evaluation (Signed)
Occupational Therapy Evaluation Patient Details Name: [name] MRN: [id number] DOB: 06-02-1930 Today's Date: 12/21/2021   History of Present Illness pt is an 88 year old female presenting to the ED with shortness of breath, bilateral leg swelling, left leg erythema and pain; Admitted with acute on chronic CHF, MI bialteral small plueral effusions; PMH significant for dCHF, hypertension, diabetes mellitus, meningioma in the left frontal area, atrial fibrillation not on anticoagulants, CAD, CKD stage IIIa, PAD   Clinical Impression   Chart reviewed, pt greeted in chair agreeable to OT evaluation. Co tx completed with PT on this date. PTA pt reports MOD I for ADL, assist for IADL as needed through [facility]. In the past week, pt has required increased assist for all ADLs with difficulties in bathing and LB dressing. On this date pt performs STS with MIN A, amb in hallway with RW 100'. MAX A required for LB dressing due to edema. Pt provided education re: STR due to functional deficits in strength, endurance, activity tolerance. Pt denies and reports she will get required aids to assist as home. Recommend STR however pt reports she will plan to discharge home with [facility] and pay for aids as needed. Pt is left in bedside chair, NAD, all needs met. OT will follow acutely.      Recommendations for follow up therapy are one component of a multi-disciplinary discharge planning process, led by the attending physician.  Recommendations may be updated based on patient status, additional functional criteria and insurance authorization.   Follow Up Recommendations  Skilled nursing-short term rehab (<3 hours/day) (pt reports she does not want STR, can get required assistance at home. HHOT recommended.)    Assistance Recommended at Discharge Intermittent Supervision/Assistance  Patient can return home with the following A little help with walking and/or transfers;A lot of help with  bathing/dressing/bathroom    Functional Status Assessment  Patient has had a recent decline in their functional status and demonstrates the ability to make significant improvements in function in a reasonable and predictable amount of time.  Equipment Recommendations  BSC/3in1    Recommendations for Other Services       Precautions / Restrictions Precautions Precautions: Fall Restrictions Weight Bearing Restrictions: No      Mobility Bed Mobility               General bed mobility comments: NT pt in recliner pre/post session    Transfers Overall transfer level: Needs assistance Equipment used: Rolling walker (2 wheels) Transfers: Sit to/from Stand Sit to Stand: Min assist                  Balance Overall balance assessment: Needs assistance Sitting-balance support: Feet supported, Bilateral upper extremity supported Sitting balance-Leahy Scale: Fair     Standing balance support: Bilateral upper extremity supported, During functional activity, Reliant on assistive device for balance Standing balance-Leahy Scale: Fair                             ADL either performed or assessed with clinical judgement   ADL Overall ADL's : Needs assistance/impaired Eating/Feeding: Set up;Sitting   Grooming: Wash/dry hands;Sitting;Set up           Upper Body Dressing : Minimal assistance;Sitting Upper Body Dressing Details (indicate cue type and reason): gown Lower Body Dressing: Maximal assistance Lower Body Dressing Details (indicate cue type and reason): socks in chair due to BLE edema Toilet Transfer: Supervision/safety;Min guard;Ambulation;Rolling  walker (2 wheels) Toilet Transfer Details (indicate cue type and reason): simulated to bedside chair         Functional mobility during ADLs: Supervision/safety;Min guard;Rolling walker (2 wheels)       Vision Patient Visual Report: No change from baseline       Perception     Praxis       Pertinent Vitals/Pain Pain Assessment Pain Assessment: Faces Faces Pain Scale: Hurts little more Pain Location: BLE Pain Descriptors / Indicators: Discomfort Pain Intervention(s): Repositioned, Monitored during session     Hand Dominance     Extremity/Trunk Assessment Upper Extremity Assessment Upper Extremity Assessment: Generalized weakness   Lower Extremity Assessment Lower Extremity Assessment: Generalized weakness   Cervical / Trunk Assessment Cervical / Trunk Assessment: Kyphotic   Communication Communication Communication: HOH   Cognition Arousal/Alertness: Awake/alert Behavior During Therapy: WFL for tasks assessed/performed Overall Cognitive Status: Within Functional Limits for tasks assessed                                       General Comments  pt BLE weeping, large blister noted on LLE    Exercises Other Exercises Other Exercises: edu re: role of OT, role of rehab, discharge recommendations, home safety, falls prevention   Shoulder Instructions      Home Living Family/patient expects to be discharged to:: Private residence ([facility]) Living Arrangements: Alone Available Help at Discharge: Available PRN/intermittently;Personal care attendant (has a cleaning lady 1x per week, can hire an aid) Type of Home: Independent living facility Home Access: Level entry     Home Layout: One level     Bathroom Shower/Tub: Walk-in shower         Home Equipment: Conservation officer, nature (2 wheels);Rollator (4 wheels);Shower seat          Prior Functioning/Environment Prior Level of Function : Independent/Modified Independent             Mobility Comments: amb with rollator ADLs Comments: MOD I- I in ADLs  up until this pastweek where pt reports incresed difficulties completing LB ADLs, showering; pt has assist with cooking, cleaning at ILF        OT Problem List: Decreased strength;Decreased activity tolerance;Impaired balance  (sitting and/or standing);Impaired UE functional use;Cardiopulmonary status limiting activity;Decreased knowledge of use of DME or AE      OT Treatment/Interventions: Self-care/ADL training;Patient/family education;Therapeutic exercise;Balance training;Therapeutic activities    OT Goals(Current goals can be found in the care plan section) Acute Rehab OT Goals Patient Stated Goal: go back to ILF OT Goal Formulation: With patient Time For Goal Achievement: 01/04/22 Potential to Achieve Goals: Good ADL Goals Pt Will Perform Grooming: standing;sitting;with supervision Pt Will Perform Lower Body Dressing: with modified independence;sit to/from stand Pt Will Transfer to Toilet: with modified independence;ambulating Pt Will Perform Toileting - Clothing Manipulation and hygiene: with modified independence;sit to/from stand  OT Frequency: Min 2X/week    Co-evaluation PT/OT/SLP Co-Evaluation/Treatment: Yes Reason for Co-Treatment: Necessary to address cognition/behavior during functional activity   OT goals addressed during session: ADL's and self-care      AM-PAC OT "6 Clicks" Daily Activity     Outcome Measure Help from another person eating meals?: None Help from another person taking care of personal grooming?: A Little Help from another person toileting, which includes using toliet, bedpan, or urinal?: A Lot Help from another person bathing (including washing, rinsing, drying)?: A Lot Help  from another person to put on and taking off regular upper body clothing?: A Little Help from another person to put on and taking off regular lower body clothing?: A Lot 6 Click Score: 16   End of Session Equipment Utilized During Treatment: Rolling walker (2 wheels) Nurse Communication: Mobility status  Activity Tolerance: Patient tolerated treatment well Patient left: in chair;with call bell/phone within reach;with chair alarm set  OT Visit Diagnosis: Unsteadiness on feet (R26.81);Muscle  weakness (generalized) (M62.81)                Time: 1410-3013 OT Time Calculation (min): 25 min Charges:  OT General Charges $OT Visit: 1 Visit OT Evaluation $OT Eval Moderate Complexity: 1 Mod  [name], OTD OTR/L  12/21/21, 1:18 PM

## 2021-12-21 NOTE — Progress Notes (Signed)
*  PRELIMINARY RESULTS* Echocardiogram 2D Echocardiogram has been performed.  [name] 12/21/2021, 10:45 AM

## 2021-12-21 NOTE — Hospital Course (Addendum)
Taken from H&P.  [name] is a 88 y.o. female with medical history significant of dCHF, hypertension, diabetes mellitus, meningioma in the left frontal area, atrial fibrillation not on anticoagulants, CAD, CKD stage IIIa, PAD, who presents with shortness of breath, bilateral leg swelling, left leg erythema and pain.   Patient states that she has shortness of breath which has been progressively worsening the past several days.  Patient does not have cough, chest pain, fever or chills.  She complains of generalized weakness.  She also has worsening bilateral leg edema. Her left lower leg is erythematous with a big blister.  Patient does not have nausea, vomiting, diarrhea or abdominal pain.  No symptoms of UTI   Data reviewed independently and ED Course: pt was found to have BNP 732, troponin level 34, lactic acid 2.6, worsening renal function with creatinine 1.47, BUN 61 (baseline creatinine 0.97 on 04/07/2019), temperature normal, blood pressure 185/168, heart rate 67, RR 18, oxygen saturation 95% on room air.    CXR:  1. Cardiomegaly. 2. Compression fracture of a lower thoracic vertebral body with associated kyphosis, age indeterminate. 3. Small bilateral pleural effusions. 4. Nodularity projected over the lateral left chest favored to represent healed rib fractures.   EKG: I have personally reviewed.  Atrial fibrillation, QTc 390, RAD, low voltage, incomplete right bundle blockade   8/12: No urinary output recorded, renal function remained elevated with BUN of 56 and creatinine of 1.47.  Mild hypernatremia, blood cultures negative in 24 hours.  A1c of 7.3, BNP elevated at 732.  Lactic acidosis has been resolved. Wound care consult was placed for left lower extremity large blister. PT/OT are recommending rehab.  8/13: Hemodynamically stable.  Total output of 2.5 L recorded.  Echocardiogram with normal EF, indeterminate diastolic function, biatrial dilation and dilated inferior vena  cava. Slight worsening of creatinine to 1.6. CBG remained elevated-increasing Semglee to 6 units twice daily.  8/14: CBG remained elevated with stable renal function.  Increasing Semglee to 10 units twice daily.  Patient was experiencing significant pain with dressing change-tramadol was added.  IV pain medication before dressing change offered but patient refused. Patient will be going [facility] skilled [facility] on discharge where wound care can be provided.  8/15: Patient had 1 episode of being febrile at 100.1 around 5 AM today.  renal functions improving.  CBG remained elevated, adding 4 units of mealtime coverage. Patient is refusing to work with PT and OT today and keeps saying that she is becoming septic.  She was using a lot of blankets as she was feeling cold earlier. UA and blood cultures repeated at her request. UA is not consistent with UTI.  8/16: Repeat blood cultures remain negative.  Patient continue to have complaints against staff and refusing her participation. She is being discharged on 3 more days of doxycycline and will need daily dressing change for lower extremity wound.  Patient need to have a close follow-up with her nephrologist and primary care provider for further recommendations.  She will continue on current medications.

## 2021-12-21 NOTE — Progress Notes (Signed)
Progress Note   Patient: [name] RJJ:[id number] DOB: 07-30-1930 DOA: 12/20/2021     1 DOS: the patient was seen and examined on 12/21/2021   Brief hospital course: Taken from H&P.  [name] is a 88 y.o. female with medical history significant of dCHF, hypertension, diabetes mellitus, meningioma in the left frontal area, atrial fibrillation not on anticoagulants, CAD, CKD stage IIIa, PAD, who presents with shortness of breath, bilateral leg swelling, left leg erythema and pain.   Patient states that she has shortness of breath which has been progressively worsening the past several days.  Patient does not have cough, chest pain, fever or chills.  She complains of generalized weakness.  She also has worsening bilateral leg edema. Her left lower leg is erythematous with a big blister.  Patient does not have nausea, vomiting, diarrhea or abdominal pain.  No symptoms of UTI   Data reviewed independently and ED Course: pt was found to have BNP 732, troponin level 34, lactic acid 2.6, worsening renal function with creatinine 1.47, BUN 61 (baseline creatinine 0.97 on 04/07/2019), temperature normal, blood pressure 185/168, heart rate 67, RR 18, oxygen saturation 95% on room air.    CXR:  1. Cardiomegaly. 2. Compression fracture of a lower thoracic vertebral body with associated kyphosis, age indeterminate. 3. Small bilateral pleural effusions. 4. Nodularity projected over the lateral left chest favored to represent healed rib fractures.   EKG: I have personally reviewed.  Atrial fibrillation, QTc 390, RAD, low voltage, incomplete right bundle blockade   8/12: No urinary output recorded, renal function remained elevated with BUN of 56 and creatinine of 1.47.  Mild hypernatremia, blood cultures negative in 24 hours.  A1c of 7.3, BNP elevated at 732.  Lactic acidosis has been resolved. Wound care consult was placed for left lower extremity large blister. PT/OT are recommending  rehab.   Assessment and Plan: * Acute on chronic diastolic CHF (congestive heart failure) ([place]) 2D echo on 08/02/2020 showed EF> 55%.  Patient has shortness breath, 2+ bilateral leg edema, positive JVD, fine crackles on auscultation, elevated BNP 732, clinically consistent with CHF exacerbation. Echo done-pending results -Continue Lasix 40 mg bid by IV -Daily weights -strict I/O's -Low salt diet -Fluid restriction -Obtain REDs Vest reading   Cellulitis of left lower extremity Patient is not septic. - Empiric antimicrobial treatment with Rocephin - Blood cultures x 2 -preliminary results were negative - ESR and CRP-within normal limit - wound care consult - f/u LE venous Doppler to rule out DVT-negative for DVT   Myocardial injury - See above  Coronary artery disease CAD and myocardial injury due to CHF exacerbation: Troponin level 34 with a flat curve, no chest pain. -Continue aspirin -Patient is allergic to statin   Essential (primary) hypertension - IV hydralazine as needed -Hold losartan due to worsening renal function -Increase amlodipine dose from 5 to 10 mg daily -Continue metoprolol -Patient is on IV Lasix  Persistent atrial fibrillation ([place]) Patient is not taking anticoagulants currently.  Heart rate well controlled -Continue metoprolol  Type II diabetes mellitus with renal manifestations (HCC) Recent A1c 6.3, well controlled.  Patient is a taking metformin and Levemir 5 units twice daily -Glargine insulin 4 units twice daily -Sliding scale insulin  Acute renal failure superimposed on stage 3a chronic kidney disease (HCC) - Hold losartan -Monitor renal function by BMP  Elevated lactic acid level Lactic acid 2.6, patient is not septic.  No fever or leukocytosis.  No tachycardia or tachypnea. -Trend lactic acid -  Will not give IV fluid due to CHF exacerbation  Subjective: Patient was seen and examined today.  Still feeling little short of breath but  improving.  Per patient she was experiencing gradual physical decline and for her seems like getting difficult to stay independent which she likes.  Physical Exam: Vitals:   12/21/21 0411 12/21/21 0500 12/21/21 0844 12/21/21 1134  BP: (!) 155/66  (!) 169/70 (!) 144/76  Pulse: 69  63 69  Resp:   18 19  Temp: 97.8 F (36.6 C)   98.4 F (36.9 C)  TempSrc: Oral   Oral  SpO2: 96%  93% 94%  Weight:  90.4 kg    Height:       General.  Elderly lady, in no acute distress. Pulmonary.  Lungs clear bilaterally, normal respiratory effort. CV.  Regular rate and rhythm, no JVD, rub or murmur. Abdomen.  Soft, nontender, nondistended, BS positive. CNS.  Alert and oriented .  No focal neurologic deficit. Extremities.  2+ LE edema bilaterally, left lower extremity with large blister and surrounding erythema up to below knee. Psychiatry.  Judgment and insight appears normal.        Data Reviewed: Prior data reviewed.  Family Communication: Called son with no response.  Disposition: Status is: Inpatient Remains inpatient appropriate because: Severity of illness   Planned Discharge Destination: Skilled nursing facility  DVT prophylaxis.  Lovenox Time spent: 50 minutes  This record has been created using Systems analyst. Errors have been sought and corrected,but may not always be located. Such creation errors do not reflect on the standard of care.  Author: Lorella Nimrod, MD 12/21/2021 3:44 PM  For on call review [email or web address].

## 2021-12-21 NOTE — Progress Notes (Signed)
   12/21/21 2132  What Happened  Was fall witnessed? Yes  Who witnessed fall? Jake Samples, RN  Patients activity before fall ambulating-assisted;to/from bed, chair, or stretcher  Point of contact buttocks  Was patient injured? No  Follow Up  MD notified Sharion Settler, NP  Family notified No - patient refusal  Additional tests No  Progress note created (see row info) Yes  Adult Fall Risk Assessment  Risk Factor Category (scoring not indicated) Fall has occurred during this admission (document High fall risk)  Patient Fall Risk Level High fall risk  Adult Fall Risk Interventions  Required Bundle Interventions *See Row Information* High fall risk - low, moderate, and high requirements implemented  Additional Interventions Room near nurses station;Use of appropriate toileting equipment (bedpan, BSC, etc.)  Screening for Fall Injury Risk (To be completed on HIGH fall risk patients) - Assessing Need for Floor Mats  Risk For Fall Injury- Criteria for Floor Mats 85 years or older  Will Implement Floor Mats Yes  PCA/Epidural/Spinal Assessment  Respiratory Pattern Regular;Unlabored  Neurological  Neuro (WDL) X  Level of Consciousness Alert  Orientation Level Oriented X4  Cognition Appropriate at baseline;Follows commands;No memory impairment  Speech Clear  Motor Function/Sensation Assessment Grip  R Hand Grip Strong  L Hand Grip Strong  Neuro Symptoms Forgetful  Musculoskeletal  Musculoskeletal (WDL) X  Assistive Device BSC;Front wheel walker  Generalized Weakness Yes  Weight Bearing Restrictions No  Integumentary  Integumentary (WDL) X  Skin Color Appropriate for ethnicity  Skin Condition Dry  Skin Integrity Blister;Erythema/redness;Weeping  Description of Blister Serous  Blister Location Leg  Blister Location Orientation Bilateral  Blister Intervention Other (Comment) (ota)  Erythema/Redness Location Leg  Erythema/Redness Location Orientation Bilateral  Weeping Location  Leg  Weeping Location Orientation Left;Lower  Skin Turgor Non-tenting   Patient ambulating with assistance from chair to bed. Slipped on fluid from weeping extremity and was gently assisted to the floor by this RN. Lift used to place patient back in bed and NP [employer]. No injuries noted and no complaints of new pain by patient.  Earleen Reaper, RN

## 2021-12-21 NOTE — Evaluation (Signed)
Physical Therapy Evaluation Patient Details Name: [name] MRN: [id number] DOB: Jun 16, 1930 Today's Date: 12/21/2021  History of Present Illness  Pt is an 88 y/o F admitted on 12/20/21 after presenting with c/o SOB, BLE swelling, LLE erythema & pain. Pt is being treated for acute on chronic diastolic CHF & LLE cellulitis. PMH: dCHF, HTN, DM, meningioma in L frontal area, a-fib not on anticoagulants, CAD, CKD 3A, PAD  Clinical Impression  Pt seen for PT evaluation with co-tx with OT. Prior to admission pt was residing in her [place] at [facility] where she ambulated with a walker & denies falls 2/2 being very cautious. Pt does endorse difficulty performing household tasks this past week, as pt reports she hasn't showered in a week & is unable to do dishes. On this date, pt completes STS with min assist & ambulates household distances with RW & supervision<>CGA. Pt does note fatigue after mobility but is adamant about returning to her home vs STR, noting she can hire aides to assist & her cleaning lady can come more often. Will continue to follow pt acutely to address balance, endurance, and gait with LRAD. Recommend HHPT upon d/c.      Recommendations for follow up therapy are one component of a multi-disciplinary discharge planning process, led by the attending physician.  Recommendations may be updated based on patient status, additional functional criteria and insurance authorization.  Follow Up Recommendations Home health PT (pt refusing STR)      Assistance Recommended at Discharge Intermittent Supervision/Assistance  Patient can return home with the following  A little help with walking and/or transfers;A little help with bathing/dressing/bathroom;Assistance with cooking/housework;Assist for transportation    Equipment Recommendations None recommended by PT (pt reports she has a RW)  Recommendations for Other Services       Functional Status Assessment Patient has had a recent  decline in their functional status and demonstrates the ability to make significant improvements in function in a reasonable and predictable amount of time.     Precautions / Restrictions Precautions Precautions: Fall Restrictions Weight Bearing Restrictions: No      Mobility  Bed Mobility               General bed mobility comments: not tested, pt received & left sitting in recliner    Transfers Overall transfer level: Needs assistance Equipment used: Rolling walker (2 wheels) Transfers: Sit to/from Stand Sit to Stand: Min assist                Ambulation/Gait Ambulation/Gait assistance: Supervision, Min guard Gait Distance (Feet): 100 Feet Assistive device: Rolling walker (2 wheels) Gait Pattern/deviations: Decreased step length - left, Decreased step length - right, Decreased stride length Gait velocity: decreased        Stairs            Wheelchair Mobility    Modified Rankin (Stroke Patients Only)       Balance Overall balance assessment: Needs assistance Sitting-balance support: Feet supported, Bilateral upper extremity supported Sitting balance-Leahy Scale: Fair     Standing balance support: Bilateral upper extremity supported, During functional activity, Reliant on assistive device for balance Standing balance-Leahy Scale: Fair                               Pertinent Vitals/Pain Pain Assessment Pain Assessment: Faces Faces Pain Scale: Hurts little more Pain Location: BLE Pain Descriptors / Indicators: Discomfort Pain Intervention(s): Repositioned, Monitored  during session    [place] expects to be discharged to:: Private residence [facility]) Living Arrangements: Alone Available Help at Discharge:  (pt reports she can hire aides) Type of Home: Independent living facility Home Access: Level entry       [place]: One [place]: Conservation officer, nature (2 wheels);Rollator (4 wheels)       Prior Function Prior Level of Function : Independent/Modified Independent             Mobility Comments: Pt reports she was ambulatory with rollator, denies falls in the past 6 months but pt reports she was very cautious with mobility.       Hand Dominance        Extremity/Trunk Assessment   Upper Extremity Assessment Upper Extremity Assessment: Generalized weakness    Lower Extremity Assessment Lower Extremity Assessment: Generalized weakness (significant BLE edema & erythema, large blister on L shin, pt with weeping skin when BLE are touched by therapist during session)       Communication   Communication: HOH  Cognition Arousal/Alertness: Awake/alert Behavior During Therapy: WFL for tasks assessed/performed Overall Cognitive Status: Within Functional Limits for tasks assessed                                          General Comments      Exercises     Assessment/Plan    PT Assessment Patient needs continued PT services  PT Problem List Cardiopulmonary status limiting activity;Decreased strength;Decreased activity tolerance;Decreased balance;Decreased mobility;Decreased skin integrity;Decreased knowledge of use of DME       PT Treatment Interventions DME instruction;Therapeutic exercise;Gait training;Balance training;Stair training;Neuromuscular re-education;Functional mobility training;Therapeutic activities;Patient/family education    PT Goals (Current goals can be found in the Care Plan section)  Acute Rehab PT Goals Patient Stated Goal: get better, [place] PT Goal Formulation: With patient Time For Goal Achievement: 01/04/22 Potential to Achieve Goals: Good    Frequency Min 2X/week     Co-evaluation PT/OT/SLP Co-Evaluation/Treatment: Yes Reason for Co-Treatment: Necessary to address cognition/behavior during functional activity (2/2 documented hx of pt being unpleasant towards staff)           AM-PAC PT "6  Clicks" Mobility  Outcome Measure Help needed turning from your back to your side while in a flat bed without using bedrails?: A Little Help needed moving from lying on your back to sitting on the side of a flat bed without using bedrails?: A Little Help needed moving to and from a bed to a chair (including a wheelchair)?: A Little Help needed standing up from a chair using your arms (e.g., wheelchair or bedside chair)?: A Little Help needed to walk in hospital room?: A Little Help needed climbing 3-5 steps with a railing? : A Lot 6 Click Score: 17    End of Session   Activity Tolerance: Patient tolerated treatment well Patient left: in chair;with chair alarm set;with call bell/phone within reach Nurse Communication: Mobility status PT Visit Diagnosis: Unsteadiness on feet (R26.81);Muscle weakness (generalized) (M62.81)    Time: 1829-9371 PT Time Calculation (min) (ACUTE ONLY): 30 min   Charges:   PT Evaluation $PT Eval Low Complexity: Wil[place], PT, DPT 12/21/21, 12:39 PM   [name] 12/21/2021, 12:37 PM

## 2021-12-22 DIAGNOSIS — I5033 Acute on chronic diastolic (congestive) heart failure: Secondary | ICD-10-CM | POA: Diagnosis not present

## 2021-12-22 LAB — BASIC METABOLIC PANEL
Anion gap: 9 (ref 5–15)
BUN: 59 mg/dL — ABNORMAL HIGH (ref 8–23)
CO2: 34 mmol/L — ABNORMAL HIGH (ref 22–32)
Calcium: 8.6 mg/dL — ABNORMAL LOW (ref 8.9–10.3)
Chloride: 99 mmol/L (ref 98–111)
Creatinine, Ser: 1.6 mg/dL — ABNORMAL HIGH (ref 0.44–1.00)
GFR, Estimated: 31 mL/min — ABNORMAL LOW (ref >60)
Glucose, Bld: 177 mg/dL — ABNORMAL HIGH (ref 70–99)
Potassium: 3.7 mmol/L (ref 3.5–5.1)
Sodium: 142 mmol/L (ref 135–145)

## 2021-12-22 LAB — GLUCOSE, CAPILLARY
Glucose-Capillary: 154 mg/dL — ABNORMAL HIGH (ref 70–99)
Glucose-Capillary: 213 mg/dL — ABNORMAL HIGH (ref 70–99)
Glucose-Capillary: 251 mg/dL — ABNORMAL HIGH (ref 70–99)
Glucose-Capillary: 133 mg/dL — ABNORMAL HIGH (ref 70–99)

## 2021-12-22 MED ORDER — INSULIN GLARGINE-YFGN 100 UNIT/ML ~~LOC~~ SOLN
6.0000 [IU] | Freq: Two times a day (BID) | SUBCUTANEOUS | Status: DC
  Administered 2021-12-22 – 2021-12-23 (×3): 6 [IU] via SUBCUTANEOUS
  Filled 2021-12-22 (×3): qty 0.06

## 2021-12-22 MED ORDER — ENOXAPARIN SODIUM 30 MG/0.3ML IJ SOSY
30.0000 mg | PREFILLED_SYRINGE | INTRAMUSCULAR | Status: DC
  Administered 2021-12-22 – 2021-12-25 (×4): 30 mg via SUBCUTANEOUS
  Filled 2021-12-22 (×4): qty 0.3

## 2021-12-22 NOTE — Progress Notes (Signed)
Patient assisted from chair to bed with 3 assist patient calls out numerous times she is anxious, argumentative, hysterical,and uncooperative with nursing and assistants, refuses pads to be placed under BLE to absorb moisture d/t weeping and continues to disrespect nursing and staff with inappropriate requests and accusations.

## 2021-12-22 NOTE — Plan of Care (Signed)
Additional education needed for wound care.

## 2021-12-22 NOTE — Consult Note (Signed)
[facility] Nurse Consult Note: Reason for Consult:LLE with edema and circular serum filled blister on the medial aspect.  See photo provided to EMR by admitting MD, Dr. Blaine Hamper. Wound type:infectious Pressure Injury POA:N/A Measurement:To be obtained by Bedside RN today prior to application of first dressing and documented on Nursing Flow Sheet. Wound bed: deflated serum filled blister, partial thickness Drainage (amount, consistency, odor) Small serous Periwound:edematous, erythematous Dressing procedure/placement/frequency:I have provided Nursing with conservative care guidance for the LLE and affected area using a soap and water cleanse to the LLE followed by rinse and gentle pat dry. Topical care to the deflated blister will be to cover with folded layers of antimicrobial nonadherent gauze (xeroform, Lawson # [id number]) topped with an ABD pad for comfort and secured with Kerlix wrapped from just below toes to just below knee/paper tape.  [place] nursing team will not follow, but will remain available to this patient, the nursing and medical teams.  Please re-consult if needed.  Thank you for inviting [place] to participate in this patient's Plan of Care.  Maudie Flakes, MSN, RN, CNS, [place], Serita Grammes, [employer], [employer] phone:  [phone]

## 2021-12-22 NOTE — Progress Notes (Signed)
Progress Note   Patient: [name] PRX:[id number] DOB: 03-Nov-1930 DOA: 12/20/2021     2 DOS: the patient was seen and examined on 12/22/2021   Brief hospital course: Taken from H&P.  [name] is a 88 y.o. female with medical history significant of dCHF, hypertension, diabetes mellitus, meningioma in the left frontal area, atrial fibrillation not on anticoagulants, CAD, CKD stage IIIa, PAD, who presents with shortness of breath, bilateral leg swelling, left leg erythema and pain.   Patient states that she has shortness of breath which has been progressively worsening the past several days.  Patient does not have cough, chest pain, fever or chills.  She complains of generalized weakness.  She also has worsening bilateral leg edema. Her left lower leg is erythematous with a big blister.  Patient does not have nausea, vomiting, diarrhea or abdominal pain.  No symptoms of UTI   Data reviewed independently and ED Course: pt was found to have BNP 732, troponin level 34, lactic acid 2.6, worsening renal function with creatinine 1.47, BUN 61 (baseline creatinine 0.97 on 04/07/2019), temperature normal, blood pressure 185/168, heart rate 67, RR 18, oxygen saturation 95% on room air.    CXR:  1. Cardiomegaly. 2. Compression fracture of a lower thoracic vertebral body with associated kyphosis, age indeterminate. 3. Small bilateral pleural effusions. 4. Nodularity projected over the lateral left chest favored to represent healed rib fractures.   EKG: I have personally reviewed.  Atrial fibrillation, QTc 390, RAD, low voltage, incomplete right bundle blockade   8/12: No urinary output recorded, renal function remained elevated with BUN of 56 and creatinine of 1.47.  Mild hypernatremia, blood cultures negative in 24 hours.  A1c of 7.3, BNP elevated at 732.  Lactic acidosis has been resolved. Wound care consult was placed for left lower extremity large blister. PT/OT are recommending rehab.  8/13:  Hemodynamically stable.  Total output of 2.5 L recorded.  Echocardiogram with normal EF, indeterminate diastolic function, biatrial dilation and dilated inferior vena cava. Slight worsening of creatinine to 1.6. CBG remained elevated-increasing Semglee to 6 units twice daily.   Assessment and Plan: * Acute on chronic diastolic CHF (congestive heart failure) ([place]) 2D echo on 08/02/2020 showed EF> 55%.  Patient has shortness breath, 2+ bilateral leg edema, positive JVD, fine crackles on auscultation, elevated BNP 732, clinically consistent with CHF exacerbation. Echo with normal EF and indeterminate diastolic function. Renal function with slight increase in creatinine -Continue Lasix 40 mg bid by IV -Daily weights -strict I/O's -Low salt diet -Fluid restriction -Obtain REDs Vest reading   Cellulitis of left lower extremity Patient is not septic. - Empiric antimicrobial treatment with Rocephin - Blood cultures x 2 -preliminary results were negative - ESR and CRP-within normal limit - wound care consult - f/u LE venous Doppler to rule out DVT-negative for DVT   Myocardial injury - See above  Coronary artery disease CAD and myocardial injury due to CHF exacerbation: Troponin level 34 with a flat curve, no chest pain. -Continue aspirin -Patient is allergic to statin   Essential (primary) hypertension - IV hydralazine as needed -Hold losartan due to worsening renal function -Increase amlodipine dose from 5 to 10 mg daily -Continue metoprolol -Patient is on IV Lasix  Persistent atrial fibrillation ([place]) Patient is not taking anticoagulants currently.  Heart rate well controlled -Continue metoprolol  Type II diabetes mellitus with renal manifestations (HCC) Recent A1c 6.3, well controlled.  Patient is a taking metformin and Levemir 5 units twice daily.  CBG elevated today -Increase glargine insulin to 6 units twice daily -Sliding scale insulin  Acute renal failure  superimposed on stage 3a chronic kidney disease (HCC) Slight worsening of creatinine to 1.6 while she is being diuresed - Hold losartan -Monitor renal function by BMP  Elevated lactic acid level Lactic acid 2.6, patient is not septic.  No fever or leukocytosis.  No tachycardia or tachypnea. -Trend lactic acid -Will not give IV fluid due to CHF exacerbation   Subjective: Patient was complaining of a lot of pain while wound care was wrapping her lower extremities.  Blister has been ruptured.  Physical Exam: Vitals:   12/21/21 1134 12/21/21 1947 12/22/21 0020 12/22/21 0400  BP: (!) 144/76 (!) 128/47 133/65 (!) 141/52  Pulse: 69 65 (!) 55 (!) 54  Resp: _0 Temp: 98.4 F (36.9 C) 97.9 F (36.6 C) (!) 97.5 F (36.4 C) 98 F (36.7 C)  TempSrc: Oral Oral Oral Oral  SpO2: 94% 99% 96% 94%  Weight:      Height:       General.  Obese elderly lady, in no acute distress. Pulmonary.  Lungs clear bilaterally, normal respiratory effort. CV.  Regular rate and rhythm, no JVD, rub or murmur. Abdomen.  Soft, nontender, nondistended, BS positive. CNS.  Alert and oriented .  No focal neurologic deficit. Extremities.  2+ LE edema bilaterally, left lower extremity with ruptured blister and stable erythema. Psychiatry.  Judgment and insight appears normal.  Data Reviewed: Prior data reviewed  Family Communication:   Disposition: Status is: Inpatient Remains inpatient appropriate because: Severity of illness   Planned Discharge Destination: Home with Home Health  DVT prophylaxis.  Lovenox Time spent: 43 minutes  This record has been created using Systems analyst. Errors have been sought and corrected,but may not always be located. Such creation errors do not reflect on the standard of care.  Author: Lorella Nimrod, MD 12/22/2021 4:14 PM  For on call review [email or web address].

## 2021-12-22 NOTE — Assessment & Plan Note (Signed)
Recent A1c 6.3, well controlled.  Patient is a taking metformin and Levemir 5 units twice daily.  CBG elevated today -Increase glargine insulin to 6 units twice daily -Sliding scale insulin

## 2021-12-22 NOTE — Assessment & Plan Note (Signed)
CAD and myocardial injury due to CHF exacerbation: Troponin level 34 with a flat curve, no chest pain. -Continue aspirin -Patient is allergic to statin

## 2021-12-22 NOTE — Progress Notes (Signed)
PHARMACIST - PHYSICIAN COMMUNICATION  CONCERNING:  Enoxaparin (Lovenox) for DVT Prophylaxis    RECOMMENDATION: Patient was prescribed enoxaprin '40mg'$  q24 hours for VTE prophylaxis.   Filed Weights   12/20/21 2200 12/21/21 0500  Weight: 90.4 kg (199 lb 4.7 oz) 90.4 kg (199 lb 4.7 oz)    Body mass index is 33.68 kg/m.  Estimated Creatinine Clearance: 26.2 mL/min (A) (by C-G formula based on SCr of 1.6 mg/dL (H)).   Patient is candidate for enoxaparin '30mg'$  every 24 hours based on CrCl <43m/min or Weight <45kg  DESCRIPTION: [facility]  Patient is now receiving enoxaparin 30 mg every 24 hours    CGlean Salvo PharmD Clinical Pharmacist  12/22/2021 7:42 AM

## 2021-12-22 NOTE — Assessment & Plan Note (Signed)
Patient is not septic. - Empiric antimicrobial treatment with Rocephin - Blood cultures x 2 -preliminary results were negative - ESR and CRP-within normal limit - wound care consult - f/u LE venous Doppler to rule out DVT-negative for DVT  

## 2021-12-22 NOTE — Assessment & Plan Note (Addendum)
2D echo on 08/02/2020 showed EF> 55%.  Patient has shortness breath, 2+ bilateral leg edema, positive JVD, fine crackles on auscultation, elevated BNP 732, clinically consistent with CHF exacerbation. Echo with normal EF and indeterminate diastolic function. Renal function with slight increase in creatinine -Continue Lasix 40 mg bid by IV -Daily weights -strict I/O's -Low salt diet -Fluid restriction -Obtain REDs Vest reading

## 2021-12-22 NOTE — Assessment & Plan Note (Signed)
Slight worsening of creatinine to 1.6 while she is being diuresed - Hold losartan -Monitor renal function by BMP

## 2021-12-23 DIAGNOSIS — I5033 Acute on chronic diastolic (congestive) heart failure: Secondary | ICD-10-CM | POA: Diagnosis not present

## 2021-12-23 LAB — BASIC METABOLIC PANEL
Anion gap: 9 (ref 5–15)
BUN: 52 mg/dL — ABNORMAL HIGH (ref 8–23)
CO2: 33 mmol/L — ABNORMAL HIGH (ref 22–32)
Calcium: 8.7 mg/dL — ABNORMAL LOW (ref 8.9–10.3)
Chloride: 97 mmol/L — ABNORMAL LOW (ref 98–111)
Creatinine, Ser: 1.59 mg/dL — ABNORMAL HIGH (ref 0.44–1.00)
GFR, Estimated: 31 mL/min — ABNORMAL LOW (ref >60)
Glucose, Bld: 216 mg/dL — ABNORMAL HIGH (ref 70–99)
Potassium: 3.6 mmol/L (ref 3.5–5.1)
Sodium: 139 mmol/L (ref 135–145)

## 2021-12-23 LAB — GLUCOSE, CAPILLARY
Glucose-Capillary: 192 mg/dL — ABNORMAL HIGH (ref 70–99)
Glucose-Capillary: 181 mg/dL — ABNORMAL HIGH (ref 70–99)
Glucose-Capillary: 210 mg/dL — ABNORMAL HIGH (ref 70–99)
Glucose-Capillary: 243 mg/dL — ABNORMAL HIGH (ref 70–99)

## 2021-12-23 MED ORDER — INSULIN GLARGINE-YFGN 100 UNIT/ML ~~LOC~~ SOLN
10.0000 [IU] | Freq: Two times a day (BID) | SUBCUTANEOUS | Status: DC
  Administered 2021-12-23 – 2021-12-25 (×4): 10 [IU] via SUBCUTANEOUS
  Filled 2021-12-23 (×5): qty 0.1

## 2021-12-23 MED ORDER — TRAMADOL HCL 50 MG PO TABS
50.0000 mg | ORAL_TABLET | Freq: Four times a day (QID) | ORAL | Status: DC | PRN
  Administered 2021-12-23: 50 mg via ORAL
  Filled 2021-12-23 (×2): qty 1

## 2021-12-23 NOTE — TOC Initial Note (Signed)
Transition of Care [facility]) - Initial/Assessment Note    Patient Details  Name: [name] MRN: [id number] Date of Birth: [birth year]  [facility]) CM/SW Contact:    Candie Chroman, LCSW Phone Number: 12/23/2021, 1:05 PM  Clinical Narrative: CSW met with patient. No supports at bedside. CSW introduced role and explained that therapy recommendations would be discussed. Patient is from [place] but is willing to go to the rehab side. Admissions coordinator is aware. Patient does not feel stable for discharge today. No further concerns. CSW encouraged patient to contact CSW as needed. CSW will continue to follow patient for support and facilitate discharge to SNF once medically stable.  Expected Discharge Plan: Skilled Nursing Facility Barriers to Discharge: Continued Medical Work up   Patient Goals and CMS Choice     Choice offered to / list presented to : Patient  Expected Discharge Plan and Services Expected Discharge Plan: [place] Acute Care Choice: [place] Living arrangements for the past 2 months: [place]                                      Prior Living Arrangements/Services Living arrangements for the past 2 months: [place] Lives with:: Self Patient language and need for interpreter reviewed:: Yes Do you feel safe going back to the place where you live?: Yes      Need for Family Participation in Patient Care: Yes (Comment) Care giver support system in place?: Yes (comment)   Criminal Activity/Legal Involvement Pertinent to Current Situation/Hospitalization: No - Comment as needed  Activities of Daily Living Home Assistive Devices/Equipment: Walker (specify type) ADL Screening (condition at time of admission) Patient's cognitive ability adequate to safely complete daily activities?: Yes Is the patient deaf or have difficulty hearing?: Yes Does the patient have  difficulty seeing, even when wearing glasses/contacts?: No Does the patient have difficulty concentrating, remembering, or making decisions?: No Patient able to express need for assistance with ADLs?: Yes Does the patient have difficulty dressing or bathing?: Yes Independently performs ADLs?: No Communication: Independent Dressing (OT): Needs assistance Is this a change from baseline?: Change from baseline, expected to last <3days Grooming: Needs assistance Is this a change from baseline?: Change from baseline, expected to last <3 days Feeding: Independent Bathing: Needs assistance Is this a change from baseline?: Change from baseline, expected to last <3 days Toileting: Needs assistance Is this a change from baseline?: Change from baseline, expected to last <3 days In/Out Bed: Needs assistance Is this a change from baseline?: Change from baseline, expected to last <3 days Walks in Home: Needs assistance Is this a change from baseline?: Change from baseline, expected to last <3 days Does the patient have difficulty walking or climbing stairs?: Yes Weakness of Legs: Both Weakness of Arms/Hands: Both  Permission Sought/Granted Permission sought to share information with : Facility Art therapist granted to share information with : Yes, Verbal Permission Granted     Permission granted to share info w AGENCY: [place]        Emotional Assessment Appearance:: Appears stated age Attitude/Demeanor/Rapport: Engaged, Gracious Affect (typically observed): Accepting, Appropriate, Calm, Pleasant Orientation: : Oriented to Self, Oriented to Place, Oriented to  Time, Oriented to Situation Alcohol / Substance Use: Not Applicable Psych Involvement: No (comment)  Admission diagnosis:  Bilateral leg edema [R60.0] Cellulitis of left  lower extremity [L03.116] Acute on chronic diastolic CHF (congestive heart failure) ([place]) [I50.33] Patient Active Problem List   Diagnosis  Date Noted   Acute on chronic diastolic CHF (congestive heart failure) ([place]) 12/20/2021   Coronary artery disease    Type II diabetes mellitus with renal manifestations (HCC)    Myocardial injury    Elevated lactic acid level    Acute renal failure superimposed on stage 3a chronic kidney disease (HCC)    Cellulitis of left lower extremity    CKD stage G3a/A1, GFR 45-59 and albumin creatinine ratio <30 mg/g ([place]) 05/10/2019   Atherosclerosis of native arteries of the extremities with ulceration ([place]) 05/10/2019   Failure of outpatient treatment    Cellulitis of foot, right    Hyperkalemia 04/01/2019   Chronic diastolic CHF (congestive heart failure) ([place]) 03/30/2019   PVD (peripheral vascular disease) ([place]) 03/30/2019   Cellulitis of both lower extremities 03/29/2019   ARF (acute renal failure) ([place]) 03/29/2019   Postmenopausal osteoporosis 05/18/2017   Carpal tunnel syndrome on right 08/27/2016   Persistent atrial fibrillation ([place]) 07/14/2016   Backache 06/27/2016   Aortic heart murmur 09/10/2015   Macroalbuminuric diabetic nephropathy ([place]) 06/25/2015   Essential (primary) hypertension 12/06/2014   Cerebral meningioma ([place]) 11/64/3539   Complication of diabetes mellitus ([place]) 05/19/2014   History of neoplasm of bladder 05/19/2014   Personal history of healed traumatic fracture 05/19/2014   Diabetes mellitus type 2, controlled, with complications ([place]) 05/05/8345   PCP:  Rusty Aus, MD Pharmacy:   [id number] [id number]: [phone]: [phone]    Social Determinants of Health (SDOH) Interventions    Readmission Risk Interventions     No data to display

## 2021-12-23 NOTE — Assessment & Plan Note (Signed)
2D echo on 08/02/2020 showed EF> 55%.  Patient has shortness breath, 2+ bilateral leg edema, positive JVD, fine crackles on auscultation, elevated BNP 732, clinically consistent with CHF exacerbation. Echo with normal EF and indeterminate diastolic function. Renal function seems stable. -Continue Lasix 40 mg bid by IV -Daily weights -strict I/O's -Low salt diet -Fluid restriction -Obtain REDs Vest reading

## 2021-12-23 NOTE — Progress Notes (Signed)
During dressing change of pt's LLE, pt kept yelling out in pain no matter how gently I picked up her foot to take off the soiled wrappings and then re-dressed it. I had given her Tramadol 2 hours prior so that she could receive the maximum amount of pain relief before having to touch her leg. Unfortunately, it did not help. I notified Dr. Reesa Chew and she talked to the patient and suggested that we order a small dose of IV pain meds so that we could give it to her right before drsg changes. Irregardless, the patient refused despite the doctor explaining in detail how helpful it would be. Still pt refused.

## 2021-12-23 NOTE — NC FL2 (Signed)
[place] LEVEL OF CARE SCREENING TOOL     IDENTIFICATION  Patient Name: [name] Birthdate: 1931-02-14 Sex: female Admission Date (Current Location): 12/20/2021  [facility] and [place] Number:  Engineering geologist and Address:  [facility], [address], [place], [place] [address]      Provider Number: [id number]  Attending Physician Name and Address:  Lorella Nimrod, MD  Relative Name and Phone Number:       Current Level of Care: Hospital Recommended Level of Care: [place] Prior Approval Number:    Date Approved/Denied:   PASRR Number: [id number]  Discharge Plan: SNF    Current Diagnoses: Patient Active Problem List   Diagnosis Date Noted   Acute on chronic diastolic CHF (congestive heart failure) ([place]) 12/20/2021   Coronary artery disease    Type II diabetes mellitus with renal manifestations ([place])    Myocardial injury    Elevated lactic acid level    Acute renal failure superimposed on stage 3a chronic kidney disease ([place])    Cellulitis of left lower extremity    CKD stage G3a/A1, GFR 45-59 and albumin creatinine ratio <30 mg/g ([place]) 05/10/2019   Atherosclerosis of native arteries of the extremities with ulceration ([place]) 05/10/2019   Failure of outpatient treatment    Cellulitis of foot, right    Hyperkalemia 04/01/2019   Chronic diastolic CHF (congestive heart failure) ([place]) 03/30/2019   PVD (peripheral vascular disease) ([place]) 03/30/2019   Cellulitis of both lower extremities 03/29/2019   ARF (acute renal failure) ([place]) 03/29/2019   Postmenopausal osteoporosis 05/18/2017   Carpal tunnel syndrome on right 08/27/2016   Persistent atrial fibrillation ([place]) 07/14/2016   Backache 06/27/2016   Aortic heart murmur 09/10/2015   Macroalbuminuric diabetic nephropathy ([place]) 06/25/2015   Essential (primary) hypertension 12/06/2014   Cerebral meningioma ([place]) 26/71/2458   Complication of diabetes mellitus  ([place]) 05/19/2014   History of neoplasm of bladder 05/19/2014   Personal history of healed traumatic fracture 05/19/2014   Diabetes mellitus type 2, controlled, with complications ([place]) 09/98/3382    Orientation RESPIRATION BLADDER Height & Weight     Self, Time, Situation, Place  Normal Incontinent, External catheter Weight: 211 lb 10.3 oz (96 kg) Height:  5' 4.5" (163.8 cm)  BEHAVIORAL SYMPTOMS/MOOD NEUROLOGICAL BOWEL NUTRITION STATUS   (None)  (None) Continent Diet (2 gram sodium)  AMBULATORY STATUS COMMUNICATION OF NEEDS Skin   Limited Assist Verbally Other (Comment) (Blister, erythema/redness, weeping. Wound on left anterior lower mid pretibial (gauze, ABD, petroleum every shift).)                       Personal Care Assistance Level of Assistance  Bathing, Feeding, Dressing Bathing Assistance: Maximum assistance Feeding assistance: Limited assistance Dressing Assistance: Maximum assistance     Functional Limitations Info  Sight, Hearing, Speech Sight Info: Adequate Hearing Info: Adequate Speech Info: Adequate    SPECIAL CARE FACTORS FREQUENCY  PT (By licensed PT), OT (By licensed OT)     PT Frequency: 5 x week OT Frequency: 5 x week            Contractures Contractures Info: Not present    Additional Factors Info  Code Status, Allergies Code Status Info: DNR Allergies Info: Alendronate, Iodinated Contrast Media, Statins           Current Medications (12/23/2021):  This is the current hospital active medication list Current Facility-Administered Medications  Medication Dose Route Frequency Provider Last Rate Last  Admin   acetaminophen (TYLENOL) tablet 650 mg  650 mg Oral Q6H PRN Ivor Costa, MD   650 mg at 12/23/21 0804   amLODipine (NORVASC) tablet 10 mg  10 mg Oral Daily Ivor Costa, MD   10 mg at 12/23/21 0804   aspirin EC tablet 325 mg  325 mg Oral Daily Ivor Costa, MD   325 mg at 12/23/21 0804   cefTRIAXone (ROCEPHIN) 1 g in sodium chloride 0.9 %  100 mL IVPB  1 g Intravenous Q24H Ivor Costa, MD 200 mL/hr at 12/22/21 1631 1 g at 12/22/21 1631   enoxaparin (LOVENOX) injection 30 mg  30 mg Subcutaneous Q24H Mickeal Skinner A, RPH   30 mg at 12/23/21 0802   furosemide (LASIX) injection 40 mg  40 mg Intravenous Huntley Dec, MD   40 mg at 12/23/21 0540   hydrALAZINE (APRESOLINE) injection 5 mg  5 mg Intravenous Q2H PRN Ivor Costa, MD       insulin aspart (novoLOG) injection 0-5 Units  0-5 Units Subcutaneous QHS Ivor Costa, MD       insulin aspart (novoLOG) injection 0-9 Units  0-9 Units Subcutaneous TID WC Ivor Costa, MD   2 Units at 12/23/21 1212   insulin glargine-yfgn (SEMGLEE) injection 10 Units  10 Units Subcutaneous BID Lorella Nimrod, MD       metoprolol tartrate (LOPRESSOR) tablet 25 mg  25 mg Oral BID Ivor Costa, MD   25 mg at 12/23/21 0804   ondansetron (ZOFRAN) injection 4 mg  4 mg Intravenous Q8H PRN Ivor Costa, MD       traMADol Veatrice Bourbon) tablet 50 mg  50 mg Oral Q6H PRN Lorella Nimrod, MD   50 mg at 12/23/21 1610     Discharge Medications: Please see discharge summary for a list of discharge medications.  Relevant Imaging Results:  Relevant Lab Results:   Additional Information SS#: [id number]  Candie Chroman, LCSW

## 2021-12-23 NOTE — Progress Notes (Signed)
PT Cancellation Note  Patient Details Name: [name] MRN: [id number] DOB: July 20, 1930   Cancelled Treatment:    Reason Eval/Treat Not Completed: Patient declined, no reason specified Attempted to see pt for PT tx. Pt reports she is in the middle of her meal & today isn't a good day. Offered to assist pt with sitting EOB to complete meal & work on sitting tolerance. Pt continues to decline & is not very open to PT education, reporting she is aware of all the information. Pt declines PT returning after she finishes lunch because "today is not a good day".  Of note, pt does report she's agreeable to going to STR at [facility] upon d/c from acute hospital, which will likely be updated recommendation based on pt's limited activity tolerance & fall while in acute setting (noted in chart over the weekend).  Lavone Nian, PT, DPT 12/23/21, 1:33 PM    [name] 12/23/2021, 1:31 PM

## 2021-12-23 NOTE — Progress Notes (Signed)
Progress Note   Patient: [name] EXH:[id number] DOB: [birth year] DOA: 12/20/2021     3 DOS: the patient was seen and examined on 12/23/2021   Brief hospital course: Taken from H&P.  [name] is a 88 y.o. female with medical history significant of dCHF, hypertension, diabetes mellitus, meningioma in the left frontal area, atrial fibrillation not on anticoagulants, CAD, CKD stage IIIa, PAD, who presents with shortness of breath, bilateral leg swelling, left leg erythema and pain.   Patient states that she has shortness of breath which has been progressively worsening the past several days.  Patient does not have cough, chest pain, fever or chills.  She complains of generalized weakness.  She also has worsening bilateral leg edema. Her left lower leg is erythematous with a big blister.  Patient does not have nausea, vomiting, diarrhea or abdominal pain.  No symptoms of UTI   Data reviewed independently and ED Course: pt was found to have BNP 732, troponin level 34, lactic acid 2.6, worsening renal function with creatinine 1.47, BUN 61 (baseline creatinine 0.97 on 04/07/2019), temperature normal, blood pressure 185/168, heart rate 67, RR 18, oxygen saturation 95% on room air.    CXR:  1. Cardiomegaly. 2. Compression fracture of a lower thoracic vertebral body with associated kyphosis, age indeterminate. 3. Small bilateral pleural effusions. 4. Nodularity projected over the lateral left chest favored to represent healed rib fractures.   EKG: I have personally reviewed.  Atrial fibrillation, QTc 390, RAD, low voltage, incomplete right bundle blockade   8/12: No urinary output recorded, renal function remained elevated with BUN of 56 and creatinine of 1.47.  Mild hypernatremia, blood cultures negative in 24 hours.  A1c of 7.3, BNP elevated at 732.  Lactic acidosis has been resolved. Wound care consult was placed for left lower extremity large blister. PT/OT are recommending rehab.  8/13:  Hemodynamically stable.  Total output of 2.5 L recorded.  Echocardiogram with normal EF, indeterminate diastolic function, biatrial dilation and dilated inferior vena cava. Slight worsening of creatinine to 1.6. CBG remained elevated-increasing Semglee to 6 units twice daily.  8/14: CBG remained elevated with stable renal function.  Increasing Semglee to 10 units twice daily.  Patient was experiencing significant pain with dressing change-tramadol was added.  IV pain medication before dressing change offered but patient refused. Patient will be going to skilled nursing site of [facility] on discharge where wound care can be provided.   Assessment and Plan: * Acute on chronic diastolic CHF (congestive heart failure) ([place]) 2D echo on 08/02/2020 showed EF> 55%.  Patient has shortness breath, 2+ bilateral leg edema, positive JVD, fine crackles on auscultation, elevated BNP 732, clinically consistent with CHF exacerbation. Echo with normal EF and indeterminate diastolic function. Renal function seems stable. -Continue Lasix 40 mg bid by IV -Daily weights -strict I/O's -Low salt diet -Fluid restriction -Obtain REDs Vest reading   Cellulitis of left lower extremity Patient is not septic. - Empiric antimicrobial treatment with Rocephin - Blood cultures x 2 -preliminary results were negative - ESR and CRP-within normal limit - wound care consult LE venous Doppler-negative for DVT   Myocardial injury - See above  Coronary artery disease CAD and myocardial injury due to CHF exacerbation: Troponin level 34 with a flat curve, no chest pain. -Continue aspirin -Patient is allergic to statin   Essential (primary) hypertension - IV hydralazine as needed -Hold losartan due to worsening renal function -Increase amlodipine dose from 5 to 10 mg daily -Continue metoprolol -Patient  is on IV Lasix  Persistent atrial fibrillation [facility]) Patient is not taking anticoagulants currently.  Heart rate  well controlled -Continue metoprolol  Type II diabetes mellitus with renal manifestations (HCC) Recent A1c 6.3, well controlled.  Patient is a taking metformin and Levemir 5 units twice daily.  CBG elevated today -Increase glargine insulin to 6 units twice daily -Sliding scale insulin  Acute renal failure superimposed on stage 3a chronic kidney disease (HCC) Slight worsening of creatinine to 1.6 while she is being diuresed - Hold losartan -Monitor renal function by BMP  Elevated lactic acid level Lactic acid 2.6, patient is not septic.  No fever or leukocytosis.  No tachycardia or tachypnea. -Trend lactic acid -Will not give IV fluid due to CHF exacerbation   Subjective: Patient was concerned that nursing staff is handling her lower extremity very roughly causing a lot of discomfort.  Per patient she is not concerned about her pain but she was concerned about that handling.  I offered IV pain medications before dressing change but she declined.  Physical Exam: Vitals:   12/23/21 0354 12/23/21 0553 12/23/21 0727 12/23/21 1129  BP: (!) 144/67  (!) 156/71 (!) 106/52  Pulse: 65  70 65  Resp: '16  16 16  ' Temp: 98.7 F (37.1 C)  98.3 F (36.8 C) 97.9 F (36.6 C)  TempSrc:      SpO2: 94%  93% 93%  Weight:  96 kg    Height:       General.  Obese elderly lady, in no acute distress. Pulmonary.  Lungs clear bilaterally, normal respiratory effort. CV.  Regular rate and rhythm, no JVD, rub or murmur. Abdomen.  Soft, nontender, nondistended, BS positive. CNS.  Alert and oriented .  No focal neurologic deficit. Extremities.  2+ LE edema bilaterally, right leg with Ace wrap and left leg with clean white bandage for ruptured blister.  Erythema seems improving Psychiatry.  Judgment and insight appears normal.  Data Reviewed: Prior data reviewed.  Family Communication:   Disposition: Status is: Inpatient Remains inpatient appropriate because: Severity of illness.   Planned Discharge  Destination: Skilled nursing facility  DVT Prophylaxis. Lovenox Time spent: 40 minutes  This record has been created using Systems analyst. Errors have been sought and corrected,but may not always be located. Such creation errors do not reflect on the standard of care.  Author: Lorella Nimrod, MD 12/23/2021 1:40 PM  For on call review [email or web address].

## 2021-12-23 NOTE — Consult Note (Addendum)
   Heart Failure Nurse Navigator Note  HFpEF 60 to 65%.  Indeterminate diastolic dysfunction.  Right ventricular systolic function is mildly reduced.  She presented to the emergency room with complaints of shortness of breath for several days, bilateral leg swelling, left leg erythema and pain for several weeks.  She came from independent living at [facility].  BNP 732.  Comorbidities:  Artery disease Hypertension Persistent atrial fibrillation Type 2 diabetes Chronic kidney disease stage III  Medications:  Amlodipine 10 mg daily Aspirin 325 mg daily Furosemide 40 mg IV every 12 Metoprolol tartrate 25 mg 2 times a day   Labs:  Sodium 139, potassium 3.6, chloride 97, CO2 33, BUN 52, creatinine 1.59, estimated GFR 31. Weight is 96 kg Blood pressure 106/52  Intake 1440 mL Output 1100 mL   Initial meeting with patient who was lying quietly in bed in no acute distress.  Discussed heart failure and what it means.  She currently lives by herself, does not have younger family close.  Both her sons live out of state, one on the [place] the other on the [place].  With use of heart failure teaching booklet, discussed signs and symptoms to report, also went over daily weights and what to report.  She states she does not like to use her scale as she does not like the readings.  Stressed that weight and weight increases are an important step in taking care of herself.  She states she has a nephrologist at [facility] and he is very hard to get in to see.  Made aware of follow up in the outpatient HF clinic and has an appointment on September 6, at 2 PM.  She was given the living with heart failure teaching booklet, info on heart failure, low sodium, zone magnet and weight chart.  Asked why I gave her all this teaching information because she states she already knows and understands heart failure.  Explained that we always have something that we can be learning and asked that she looked  through the information.  Goes on to state that she might be severing her ties with [facility] and wanted to speak to me about it.  I asked that she speak with someone  who could handle her complaints.  Told her I would follow along with her tomorrow answer any questions she might have.  Pricilla Riffle RN  CHFN

## 2021-12-23 NOTE — Assessment & Plan Note (Signed)
Patient is not septic. - Empiric antimicrobial treatment with Rocephin - Blood cultures x 2 -preliminary results were negative - ESR and CRP-within normal limit - wound care consult LE venous Doppler-negative for DVT

## 2021-12-23 NOTE — TOC CM/SW Note (Signed)
CSW has gone in room three times and patient has been on the phone. Sent secure chat to RN asking her to notify CSW when she is off the phone. Need to ask her if she is interested in going to the SNF side at [facility].  Dayton Scrape, [place]

## 2021-12-24 DIAGNOSIS — I5033 Acute on chronic diastolic (congestive) heart failure: Secondary | ICD-10-CM | POA: Diagnosis not present

## 2021-12-24 LAB — URINALYSIS, COMPLETE (UACMP) WITH MICROSCOPIC
Bilirubin Urine: NEGATIVE
Glucose, UA: NEGATIVE mg/dL
Hgb urine dipstick: NEGATIVE
Ketones, ur: NEGATIVE mg/dL
Leukocytes,Ua: NEGATIVE
Nitrite: NEGATIVE
Protein, ur: NEGATIVE mg/dL
Specific Gravity, Urine: 1.012 (ref 1.005–1.030)
WBC, UA: NONE SEEN WBC/hpf (ref 0–5)
pH: 5 (ref 5.0–8.0)

## 2021-12-24 LAB — BASIC METABOLIC PANEL
Anion gap: 9 (ref 5–15)
BUN: 46 mg/dL — ABNORMAL HIGH (ref 8–23)
CO2: 32 mmol/L (ref 22–32)
Calcium: 8.5 mg/dL — ABNORMAL LOW (ref 8.9–10.3)
Chloride: 97 mmol/L — ABNORMAL LOW (ref 98–111)
Creatinine, Ser: 1.42 mg/dL — ABNORMAL HIGH (ref 0.44–1.00)
GFR, Estimated: 35 mL/min — ABNORMAL LOW (ref >60)
Glucose, Bld: 204 mg/dL — ABNORMAL HIGH (ref 70–99)
Potassium: 3.5 mmol/L (ref 3.5–5.1)
Sodium: 138 mmol/L (ref 135–145)

## 2021-12-24 LAB — CBC
HCT: 32.7 % — ABNORMAL LOW (ref 36.0–46.0)
Hemoglobin: 10.6 g/dL — ABNORMAL LOW (ref 12.0–15.0)
MCH: 29.5 pg (ref 26.0–34.0)
MCHC: 32.4 g/dL (ref 30.0–36.0)
MCV: 91.1 fL (ref 80.0–100.0)
Platelets: 183 10*3/uL (ref 150–400)
RBC: 3.59 MIL/uL — ABNORMAL LOW (ref 3.87–5.11)
RDW: 16.8 % — ABNORMAL HIGH (ref 11.5–15.5)
WBC: 10.4 10*3/uL (ref 4.0–10.5)
nRBC: 0 % (ref 0.0–0.2)

## 2021-12-24 LAB — GLUCOSE, CAPILLARY
Glucose-Capillary: 186 mg/dL — ABNORMAL HIGH (ref 70–99)
Glucose-Capillary: 258 mg/dL — ABNORMAL HIGH (ref 70–99)
Glucose-Capillary: 108 mg/dL — ABNORMAL HIGH (ref 70–99)
Glucose-Capillary: 208 mg/dL — ABNORMAL HIGH (ref 70–99)

## 2021-12-24 MED ORDER — INSULIN ASPART 100 UNIT/ML IJ SOLN
4.0000 [IU] | Freq: Three times a day (TID) | INTRAMUSCULAR | Status: DC
  Administered 2021-12-24 – 2021-12-25 (×3): 4 [IU] via SUBCUTANEOUS
  Filled 2021-12-24 (×3): qty 1

## 2021-12-24 NOTE — Progress Notes (Signed)
Progress Note   Patient: [name] PNT:[id number] DOB: [birth year] DOA: 12/20/2021     4 DOS: the patient was seen and examined on 12/24/2021   Brief hospital course: Taken from H&P.  [name] is a 88 y.o. female with medical history significant of dCHF, hypertension, diabetes mellitus, meningioma in the left frontal area, atrial fibrillation not on anticoagulants, CAD, CKD stage IIIa, PAD, who presents with shortness of breath, bilateral leg swelling, left leg erythema and pain.   Patient states that she has shortness of breath which has been progressively worsening the past several days.  Patient does not have cough, chest pain, fever or chills.  She complains of generalized weakness.  She also has worsening bilateral leg edema. Her left lower leg is erythematous with a big blister.  Patient does not have nausea, vomiting, diarrhea or abdominal pain.  No symptoms of UTI   Data reviewed independently and ED Course: pt was found to have BNP 732, troponin level 34, lactic acid 2.6, worsening renal function with creatinine 1.47, BUN 61 (baseline creatinine 0.97 on 04/07/2019), temperature normal, blood pressure 185/168, heart rate 67, RR 18, oxygen saturation 95% on room air.    CXR:  1. Cardiomegaly. 2. Compression fracture of a lower thoracic vertebral body with associated kyphosis, age indeterminate. 3. Small bilateral pleural effusions. 4. Nodularity projected over the lateral left chest favored to represent healed rib fractures.   EKG: I have personally reviewed.  Atrial fibrillation, QTc 390, RAD, low voltage, incomplete right bundle blockade   8/12: No urinary output recorded, renal function remained elevated with BUN of 56 and creatinine of 1.47.  Mild hypernatremia, blood cultures negative in 24 hours.  A1c of 7.3, BNP elevated at 732.  Lactic acidosis has been resolved. Wound care consult was placed for left lower extremity large blister. PT/OT are recommending rehab.  8/13:  Hemodynamically stable.  Total output of 2.5 L recorded.  Echocardiogram with normal EF, indeterminate diastolic function, biatrial dilation and dilated inferior vena cava. Slight worsening of creatinine to 1.6. CBG remained elevated-increasing Semglee to 6 units twice daily.  8/14: CBG remained elevated with stable renal function.  Increasing Semglee to 10 units twice daily.  Patient was experiencing significant pain with dressing change-tramadol was added.  IV pain medication before dressing change offered but patient refused. Patient will be going to skilled nursing site of [facility] on discharge where wound care can be provided.  8/15: Patient had 1 episode of being febrile at 100.1 around 5 AM today.  CBC with **, renal functions improving.  CBG remained elevated, adding 4 units of mealtime coverage. Patient is refusing to work with PT and OT today and keeps saying that she is becoming septic.  She was using a lot of blankets as she was feeling cold earlier. UA and blood cultures repeated at her request. UA is not consistent with UTI.   Assessment and Plan: * Acute on chronic diastolic CHF (congestive heart failure) ([place]) 2D echo on 08/02/2020 showed EF> 55%.  Patient has shortness breath, 2+ bilateral leg edema, positive JVD, fine crackles on auscultation, elevated BNP 732, clinically consistent with CHF exacerbation. Echo with normal EF and indeterminate diastolic function. Renal function seems stable. -Continue Lasix 40 mg bid by IV -Daily weights -strict I/O's -Low salt diet -Fluid restriction -Obtain REDs Vest reading   Cellulitis of left lower extremity Patient is not septic.  Cellulitis seems improving. - Empiric antimicrobial treatment with Rocephin -Prior blood cultures remain negative, repeating another  1 today as patient shows 1 episode of fever and was very concerned about sepsis. - ESR and CRP-within normal limit - wound care consult LE venous Doppler-negative for  DVT   Myocardial injury - See above  Coronary artery disease CAD and myocardial injury due to CHF exacerbation: Troponin level 34 with a flat curve, no chest pain. -Continue aspirin -Patient is allergic to statin   Essential (primary) hypertension - IV hydralazine as needed -Hold losartan due to worsening renal function -Increase amlodipine dose from 5 to 10 mg daily -Continue metoprolol -Patient is on IV Lasix  Persistent atrial fibrillation ([place]) Patient is not taking anticoagulants currently.  Heart rate well controlled -Continue metoprolol  Type II diabetes mellitus with renal manifestations (HCC) Recent A1c 6.3, well controlled.  Patient is a taking metformin and Levemir 5 units twice daily.  CBG elevated today -Increase glargine insulin to 6 units twice daily -Sliding scale insulin  Acute renal failure superimposed on stage 3a chronic kidney disease (HCC) Slight worsening of creatinine to 1.6 while she is being diuresed - Hold losartan -Monitor renal function by BMP  Elevated lactic acid level Resolved. Initial lactic acid 2.6, patient is not septic.  No fever or leukocytosis.  No tachycardia or tachypnea.   Subjective: Patient was complaining of feeling cold and keeps saying that my internal temperature is rising and I am becoming septic.  She was refusing to work with PT and OT.  Physical Exam: Vitals:   12/24/21 0729 12/24/21 1228 12/24/21 1229 12/24/21 1534  BP: (!) 148/97 106/88  (!) 135/55  Pulse: 73 (!) 40 61 60  Resp: '20 16  18  ' Temp: 99.5 F (37.5 C) 99.3 F (37.4 C)  98.9 F (37.2 C)  TempSrc:      SpO2: 95% 94% 95% 96%  Weight:      Height:       General.  Obese elderly lady, in no acute distress. Pulmonary.  Lungs clear bilaterally, normal respiratory effort. CV.  Regular rate and rhythm, no JVD, rub or murmur. Abdomen.  Soft, nontender, nondistended, BS positive. CNS.  Alert and oriented .  No focal neurologic deficit. Extremities.  No  edema, no cyanosis, pulses intact and symmetrical. Psychiatry.  Judgment and insight appears normal.  Data Reviewed: Prior data reviewed  Family Communication:   Disposition: Status is: Inpatient Remains inpatient appropriate because: Severity of illness   Planned Discharge Destination: Skilled nursing facility  DVT prophylaxis.  Lovenox Time spent: 40 minutes  This record has been created using Systems analyst. Errors have been sought and corrected,but may not always be located. Such creation errors do not reflect on the standard of care.  Author: Lorella Nimrod, MD 12/24/2021 3:35 PM  For on call review [email or web address].

## 2021-12-24 NOTE — Progress Notes (Signed)
OT Cancellation Note  Patient Details Name: [name] MRN: [id number] DOB: [birth year]   Cancelled Treatment:    Reason Eval/Treat Not Completed: Patient declined, no reason specified;Other (comment). OT order received, chart reviewed. Pt cleared by RN to participate with therapy. Upon arrival to pt room, pt adamantly declining therapy services. She states, "Didn't anyone tell you? I have a fever, my oxygen is dropping, things aren't looking good". OT attempts to engage pt in bed-level activity, however pt adamantly declines stating it is not safe. Will hold OT services at this time and re-attempt at a later date/time as available and pt agreeable to therapy services.   Shara Blazing, M.S., OTR/L Ascom: [phone] 12/24/21, 9:52 AM

## 2021-12-24 NOTE — Assessment & Plan Note (Signed)
2D echo on 08/02/2020 showed EF> 55%.  Patient has shortness breath, 2+ bilateral leg edema, positive JVD, fine crackles on auscultation, elevated BNP 732, clinically consistent with CHF exacerbation. Echo with normal EF and indeterminate diastolic function. Renal function seems stable. -Continue Lasix 40 mg bid by IV -Daily weights -strict I/O's -Low salt diet -Fluid restriction -Obtain REDs Vest reading

## 2021-12-24 NOTE — Progress Notes (Signed)
PT Cancellation Note  Patient Details Name: [name] MRN: [id number] DOB: 1931/01/25   Cancelled Treatment:    Reason Eval/Treat Not Completed: Patient declined, no reason specified. Pt initially asleep upon PT arrival but easy to arouse. However, pt refusing to participate in PT session this date, reporting "my temperature is rising, I have a fever and a very serious infection. Something's brewing! I can't be bounced around today." PT will continue to f/u with pt acutely as available and appropriate.    [place] 12/24/2021, 8:24 AM

## 2021-12-24 NOTE — Assessment & Plan Note (Signed)
Resolved. Initial lactic acid 2.6, patient is not septic.  No fever or leukocytosis.  No tachycardia or tachypnea.

## 2021-12-24 NOTE — Assessment & Plan Note (Signed)
Patient is not septic.  Cellulitis seems improving. - Empiric antimicrobial treatment with Rocephin -Prior blood cultures remain negative, repeating another 1 today as patient shows 1 episode of fever and was very concerned about sepsis. - ESR and CRP-within normal limit - wound care consult LE venous Doppler-negative for DVT

## 2021-12-25 DIAGNOSIS — I5033 Acute on chronic diastolic (congestive) heart failure: Secondary | ICD-10-CM | POA: Diagnosis not present

## 2021-12-25 LAB — BASIC METABOLIC PANEL
Anion gap: 11 (ref 5–15)
BUN: 41 mg/dL — ABNORMAL HIGH (ref 8–23)
CO2: 33 mmol/L — ABNORMAL HIGH (ref 22–32)
Calcium: 8.9 mg/dL (ref 8.9–10.3)
Chloride: 93 mmol/L — ABNORMAL LOW (ref 98–111)
Creatinine, Ser: 1.44 mg/dL — ABNORMAL HIGH (ref 0.44–1.00)
GFR, Estimated: 35 mL/min — ABNORMAL LOW (ref >60)
Glucose, Bld: 133 mg/dL — ABNORMAL HIGH (ref 70–99)
Potassium: 3.6 mmol/L (ref 3.5–5.1)
Sodium: 137 mmol/L (ref 135–145)

## 2021-12-25 LAB — CULTURE, BLOOD (ROUTINE X 2)
Culture: NO GROWTH
Culture: NO GROWTH
Special Requests: ADEQUATE
Special Requests: ADEQUATE

## 2021-12-25 LAB — GLUCOSE, CAPILLARY
Glucose-Capillary: 171 mg/dL — ABNORMAL HIGH (ref 70–99)
Glucose-Capillary: 351 mg/dL — ABNORMAL HIGH (ref 70–99)

## 2021-12-25 MED ORDER — DOXYCYCLINE HYCLATE 100 MG PO TABS: 100.0000 mg | ORAL_TABLET | Freq: Two times a day (BID) | ORAL | 0 refills | Status: AC

## 2021-12-25 NOTE — Progress Notes (Signed)
PT Cancellation Note  Patient Details Name: [name] MRN: [id number] DOB: [birth year]   Cancelled Treatment:    Reason Eval/Treat Not Completed: Patient declined, no reason specified. Patient continues to report weakness and inability to do any therapy. She is set to discharge back to [name] for rehab today. Will continue to follow if not discharged today.    Memori Sammon 12/25/2021, 11:55 AM

## 2021-12-25 NOTE — Progress Notes (Signed)
Night nurse requested to not have this patient when she returns to work tonight.  I was assigned this patient her 1st day on the floor and had "developed" a relationship with her.  I had a long discussion with patient about overnight events.  Pt states she is advocating for herself.  I encouraged participation with her care and encouraged her to call for me if she needs anything.

## 2021-12-25 NOTE — TOC Transition Note (Signed)
Transition of Care [facility]) - CM/SW Discharge Note   Patient Details  Name: [name] MRN: [id number] Date of Birth: 04/12/1931  Transition of Care [facility]) CM/SW Contact:  Candie Chroman, LCSW Phone Number: 12/25/2021, 12:12 PM   Clinical Narrative:   Patient has orders to discharge to [facility] today. RN will call report to [phone] (Room 114). EMS transport has been arranged for 1:30. No further concerns. CSW signing off.  Final next level of care: [place] Barriers to Discharge: Barriers Resolved   Patient Goals and CMS Choice     Choice offered to / list presented to : Patient  Discharge Placement PASRR number recieved: 12/23/21            Patient chooses bed at: [facility] Patient to be transferred to facility by: EMS   Patient and family notified of of transfer: 12/25/21  Discharge Plan and Services     Post Acute Care Choice: [place]                               Social Determinants of Health (SDOH) Interventions     Readmission Risk Interventions     No data to display

## 2021-12-25 NOTE — Care Management Important Message (Signed)
Important Message  Patient Details  Name: [name] MRN: [id number] Date of Birth: 1930/05/30   Medicare Important Message Given:  Yes     Dannette Barbara 12/25/2021, 10:47 AM

## 2021-12-25 NOTE — Progress Notes (Signed)
Attempted to reposition patient. Refuses to be touched however wants nurse to place pillows under leg/ankles. Attempted to lift legs and starts yelling states, "you are not doing it right". This nurse asked patient to lift legs herself however refuses to lift legs off bed. This nurse and two other nurses, Ria Comment, RN and Charlena Cross, RN attempted to address needs however continues to complain that not doing correctly.

## 2021-12-25 NOTE — Discharge Summary (Signed)
Physician Discharge Summary   Patient: [name] MRN: [id number] DOB: 22-Oct-1930  Admit date:     12/20/2021  Discharge date: 12/25/21  Discharge Physician: Lorella Nimrod   PCP: Rusty Aus, MD   Recommendations at discharge:  Please obtain CBC and BMP in 1 week Follow-up with primary care provider  Discharge Diagnoses: Principal Problem:   Acute on chronic diastolic CHF (congestive heart failure) ([place]) Active Problems:   Cellulitis of left lower extremity   Coronary artery disease   Myocardial injury   Essential (primary) hypertension   Persistent atrial fibrillation (HCC)   Type II diabetes mellitus with renal manifestations ([place])   Acute renal failure superimposed on stage 3a chronic kidney disease ([place])   Elevated lactic acid level   Hospital Course: Taken from H&P.  [name] is a 88 y.o. female with medical history significant of dCHF, hypertension, diabetes mellitus, meningioma in the left frontal area, atrial fibrillation not on anticoagulants, CAD, CKD stage IIIa, PAD, who presents with shortness of breath, bilateral leg swelling, left leg erythema and pain.   Patient states that she has shortness of breath which has been progressively worsening the past several days.  Patient does not have cough, chest pain, fever or chills.  She complains of generalized weakness.  She also has worsening bilateral leg edema. Her left lower leg is erythematous with a big blister.  Patient does not have nausea, vomiting, diarrhea or abdominal pain.  No symptoms of UTI   Data reviewed independently and ED Course: pt was found to have BNP 732, troponin level 34, lactic acid 2.6, worsening renal function with creatinine 1.47, BUN 61 (baseline creatinine 0.97 on 04/07/2019), temperature normal, blood pressure 185/168, heart rate 67, RR 18, oxygen saturation 95% on room air.    CXR:  1. Cardiomegaly. 2. Compression fracture of a lower thoracic vertebral body with associated kyphosis, age  indeterminate. 3. Small bilateral pleural effusions. 4. Nodularity projected over the lateral left chest favored to represent healed rib fractures.   EKG: I have personally reviewed.  Atrial fibrillation, QTc 390, RAD, low voltage, incomplete right bundle blockade   8/12: No urinary output recorded, renal function remained elevated with BUN of 56 and creatinine of 1.47.  Mild hypernatremia, blood cultures negative in 24 hours.  A1c of 7.3, BNP elevated at 732.  Lactic acidosis has been resolved. Wound care consult was placed for left lower extremity large blister. PT/OT are recommending rehab.  8/13: Hemodynamically stable.  Total output of 2.5 L recorded.  Echocardiogram with normal EF, indeterminate diastolic function, biatrial dilation and dilated inferior vena cava. Slight worsening of creatinine to 1.6. CBG remained elevated-increasing Semglee to 6 units twice daily.  8/14: CBG remained elevated with stable renal function.  Increasing Semglee to 10 units twice daily.  Patient was experiencing significant pain with dressing change-tramadol was added.  IV pain medication before dressing change offered but patient refused. Patient will be going [facility] skilled nursing [facility] on discharge where wound care can be provided.  8/15: Patient had 1 episode of being febrile at 100.1 around 5 AM today.  renal functions improving.  CBG remained elevated, adding 4 units of mealtime coverage. Patient is refusing to work with PT and OT today and keeps saying that she is becoming septic.  She was using a lot of blankets as she was feeling cold earlier. UA and blood cultures repeated at her request. UA is not consistent with UTI.  8/16: Repeat blood cultures remain  negative.  Patient continue to have complaints against staff and refusing her participation. She is being discharged on 3 more days of doxycycline and will need daily dressing change for lower extremity wound.  Patient need to have a  close follow-up with her nephrologist and primary care provider for further recommendations.  She will continue on current medications.  Assessment and Plan: * Acute on chronic diastolic CHF (congestive heart failure) ([place]) 2D echo on 08/02/2020 showed EF> 55%.  Patient has shortness breath, 2+ bilateral leg edema, positive JVD, fine crackles on auscultation, elevated BNP 732, clinically consistent with CHF exacerbation. Echo with normal EF and indeterminate diastolic function. Renal function seems stable. -Continue Lasix 40 mg bid by IV -Daily weights -strict I/O's -Low salt diet -Fluid restriction -Obtain REDs Vest reading   Cellulitis of left lower extremity Patient is not septic.  Cellulitis seems improving. - Empiric antimicrobial treatment with Rocephin -Prior blood cultures remain negative, repeating another 1 today as patient shows 1 episode of fever and was very concerned about sepsis. - ESR and CRP-within normal limit - wound care consult LE venous Doppler-negative for DVT   Myocardial injury - See above  Coronary artery disease CAD and myocardial injury due to CHF exacerbation: Troponin level 34 with a flat curve, no chest pain. -Continue aspirin -Patient is allergic to statin   Essential (primary) hypertension - IV hydralazine as needed -Hold losartan due to worsening renal function -Increase amlodipine dose from 5 to 10 mg daily -Continue metoprolol -Patient is on IV Lasix  Persistent atrial fibrillation ([place]) Patient is not taking anticoagulants currently.  Heart rate well controlled -Continue metoprolol  Type II diabetes mellitus with renal manifestations (HCC) Recent A1c 6.3, well controlled.  Patient is a taking metformin and Levemir 5 units twice daily.  CBG elevated today -Increase glargine insulin to 6 units twice daily -Sliding scale insulin  Acute renal failure superimposed on stage 3a chronic kidney disease (HCC) Slight worsening of creatinine  to 1.6 while she is being diuresed - Hold losartan -Monitor renal function by BMP  Elevated lactic acid level Resolved. Initial lactic acid 2.6, patient is not septic.  No fever or leukocytosis.  No tachycardia or tachypnea.    Consultants: None Procedures performed: None Disposition: Skilled nursing facility Diet recommendation:  Discharge Diet Orders (From admission, onward)     Start     Ordered   12/25/21 0000  Diet - low sodium heart healthy        12/25/21 1138           Cardiac and Carb modified diet DISCHARGE MEDICATION: Allergies as of 12/25/2021       Reactions   Alendronate Other (See Comments)   Cramping of the jaw   Iodinated Contrast Media Other (See Comments)   Brother has allergy and she wants not to receive it    Statins Other (See Comments)   Fatigue/muscle weakness Fatigue/muscle weakness Fatigue/muscle weakness        Medication List     STOP taking these medications    amLODipine 5 MG tablet Commonly known as: NORVASC   aspirin EC 325 MG tablet   furosemide 20 MG tablet Commonly known as: LASIX   spironolactone 25 MG tablet Commonly known as: ALDACTONE       TAKE these medications    doxycycline 100 MG tablet Commonly known as: VIBRA-TABS Take 1 tablet (100 mg total) by mouth 2 (two) times daily for 3 days.   Levemir 100 UNIT/ML injection Generic  drug: insulin detemir Inject 5 Units into the skin in the morning and at bedtime.   LOSARTAN POTASSIUM PO Take 100 mg by mouth daily.   metFORMIN 1000 MG tablet Commonly known as: GLUCOPHAGE 1,000 mg 2 (two) times daily with a meal.   metoprolol tartrate 25 MG tablet Commonly known as: LOPRESSOR Take 1 tablet by mouth 2 (two) times daily.   saccharomyces boulardii 250 MG capsule Commonly known as: FLORASTOR Take 250 mg by mouth daily.   torsemide 20 MG tablet Commonly known as: DEMADEX Take 40 mg by mouth daily.               Discharge Care Instructions   (From admission, onward)           Start     Ordered   12/25/21 0000  Discharge wound care:       Comments: Wound care to left LE blister:  wash leg with soap and water, rinse and gently pat dry. Cover wound with folded layers of xeroform gauze Kellie Simmering [phone]), top with dry guaze, Top with ABD pad for comfort and secure with Kerlix roll gauze wrapped from just below toes to just below knee. Top with ACE bandage applied in a similar manner. Float heel.   12/25/21 1138            Contact information for follow-up providers     Rusty Aus, MD. Schedule an appointment as soon as possible for a visit in 1 week(s).   Specialty: Internal Medicine Contact information: [place] [address] [phone]              Contact information for after-discharge care     Destination     [facility] .   Service: Skilled Nursing Contact information: [place] [phone]                    Discharge Exam: Filed Weights   12/24/21 0437 12/24/21 0600 12/25/21 0500  Weight: 96.4 kg 96.3 kg 93.2 kg   General.     In no acute distress. Pulmonary.  Lungs clear bilaterally, normal respiratory effort. CV.  Regular rate and rhythm, no JVD, rub or murmur. Abdomen.  Soft, nontender, nondistended, BS positive. CNS.  Alert and oriented .  No focal neurologic deficit. Extremities.  No edema, no cyanosis, pulses intact and symmetrical.  Lower extremity edema and erythema has been improved.  Right lower extremity with Ace wrap and left with clean bandage. Psychiatry.  Judgment and insight appears normal.   Condition at discharge: stable  The results of significant diagnostics from this hospitalization (including imaging, microbiology, ancillary and laboratory) are listed below for reference.   Imaging Studies: ECHOCARDIOGRAM COMPLETE  Result Date: 12/21/2021    ECHOCARDIOGRAM REPORT   Patient  Name:   [name] Date of Exam: 12/21/2021 Medical Rec #:  [id number]   Height:       64.5 in Accession #:    [id number]  Weight:       199.3 lb Date of Birth:  October 31, 1930   BSA:          1.964 m Patient Age:    88 years    BP:           155/66 mmHg Patient Gender: F           HR:           64 bpm. Exam Location:  [facility] Procedure: 2D Echo, Color Doppler and Cardiac Doppler Indications:     I50.31 congestive heart failure-Acute Diastolic  History:         Patient has prior history of Echocardiogram examinations, most                  recent 07/21/2018. CAD, Arrythmias:Atrial Fibrillation; Risk                  Factors:Hypertension and Diabetes.  Sonographer:     Charmayne Sheer Referring Phys:  [place] Diagnosing Phys: Skeet Latch MD  Sonographer Comments: Suboptimal parasternal window. Image acquisition challenging due to patient body habitus. IMPRESSIONS  1. Left ventricular ejection fraction, by estimation, is 60 to 65%. The left ventricle has normal function. The left ventricle has no regional wall motion abnormalities. Left ventricular diastolic parameters are indeterminate.  2. Right ventricular systolic function is mildly reduced. The right ventricular size is normal. There is normal pulmonary artery systolic pressure.  3. Left atrial size was severely dilated.  4. Right atrial size was severely dilated.  5. The mitral valve is normal in structure. No evidence of mitral valve regurgitation. No evidence of mitral stenosis. Moderate mitral annular calcification.  6. The aortic valve is tricuspid. There is mild calcification of the aortic valve. There is mild thickening of the aortic valve. Aortic valve regurgitation is not visualized. No aortic stenosis is present.  7. The inferior vena cava is dilated in size with <50% respiratory variability, suggesting right atrial pressure of 15 mmHg. FINDINGS  Left Ventricle: Left ventricular ejection fraction, by estimation, is 60 to 65%. The left ventricle has normal  function. The left ventricle has no regional wall motion abnormalities. The left ventricular internal cavity size was normal in size. There is  no left ventricular hypertrophy. Left ventricular diastolic function could not be evaluated due to atrial fibrillation. Left ventricular diastolic parameters are indeterminate. Right Ventricle: The right ventricular size is normal. No increase in right ventricular wall thickness. Right ventricular systolic function is mildly reduced. There is normal pulmonary artery systolic pressure. The tricuspid regurgitant velocity is 2.24 m/s, and with an assumed right atrial pressure of 15 mmHg, the estimated right ventricular systolic pressure is 46.6 mmHg. Left Atrium: Left atrial size was severely dilated. Right Atrium: Right atrial size was severely dilated. Pericardium: There is no evidence of pericardial effusion. Mitral Valve: The mitral valve is normal in structure. Moderate mitral annular calcification. No evidence of mitral valve regurgitation. No evidence of mitral valve stenosis. Tricuspid Valve: The tricuspid valve is normal in structure. Tricuspid valve regurgitation is trivial. No evidence of tricuspid stenosis. Aortic Valve: The aortic valve is tricuspid. There is mild calcification of the aortic valve. There is mild thickening of the aortic valve. Aortic valve regurgitation is not visualized. No aortic stenosis is present. Aortic valve mean gradient measures 8.0 mmHg. Aortic valve peak gradient measures 14.9 mmHg. Aortic valve area, by VTI measures 0.79 cm. Pulmonic Valve: The pulmonic valve was normal in structure. Pulmonic valve regurgitation is trivial. No evidence of pulmonic stenosis. Aorta: The aortic root is normal in size and structure. Venous: The inferior vena cava is dilated in size with less than 50% respiratory variability, suggesting right atrial pressure of 15 mmHg. IAS/Shunts: No atrial level shunt detected by color flow Doppler.  LEFT VENTRICLE PLAX  2D LVOT diam:     1.60 cm     Diastology LV SV:         31  LV e' medial:    5.66 cm/s LV SV Index:   16          LV E/e' medial:  19.6 LVOT Area:     2.01 cm    LV e' lateral:   6.64 cm/s                            LV E/e' lateral: 16.7  LV Volumes (MOD) LV vol d, MOD A2C: 66.7 ml LV vol d, MOD A4C: 75.9 ml LV vol s, MOD A2C: 25.3 ml LV vol s, MOD A4C: 31.2 ml LV SV MOD A2C:     41.4 ml LV SV MOD A4C:     75.9 ml LV SV MOD BP:      43.6 ml RIGHT VENTRICLE RV Basal diam:  4.58 cm LEFT ATRIUM             Index        RIGHT ATRIUM           Index LA Vol (A2C):   78.9 ml 40.18 ml/m  RA Area:     28.70 cm LA Vol (A4C):   70.1 ml 35.69 ml/m  RA Volume:   110.00 ml 56.01 ml/m LA Biplane Vol: 76.8 ml 39.11 ml/m  AORTIC VALVE                     PULMONIC VALVE AV Area (Vmax):    0.67 cm      PV Vmax:       0.83 m/s AV Area (Vmean):   0.70 cm      PV Peak grad:  2.8 mmHg AV Area (VTI):     0.79 cm AV Vmax:           193.00 cm/s AV Vmean:          136.000 cm/s AV VTI:            0.388 m AV Peak Grad:      14.9 mmHg AV Mean Grad:      8.0 mmHg LVOT Vmax:         64.00 cm/s LVOT Vmean:        47.600 cm/s LVOT VTI:          0.153 m LVOT/AV VTI ratio: 0.39  AORTA Ao Root diam: 3.00 cm MITRAL VALVE                TRICUSPID VALVE MV Area (PHT): 3.42 cm     TR Peak grad:   20.1 mmHg MV Decel Time: 222 msec     TR Vmax:        224.00 cm/s MV E velocity: 111.00 cm/s MV A velocity: 34.30 cm/s   SHUNTS MV E/A ratio:  3.24         Systemic VTI:  0.15 m                             Systemic Diam: 1.60 cm Skeet Latch MD Electronically signed by Skeet Latch MD Signature Date/Time: 12/21/2021/4:37:18 PM    Final    US Venous Img Lower Bilateral (DVT)  Result Date: 12/20/2021 CLINICAL DATA:  Pain and edema EXAM: BILATERAL LOWER EXTREMITY VENOUS DOPPLER ULTRASOUND TECHNIQUE: Gray-scale sonography with compression, as well as color and duplex ultrasound, were performed to evaluate the deep venous system(s) from the  level of the common femoral vein  through the popliteal and proximal calf veins. COMPARISON:  None Available. FINDINGS: VENOUS Normal compressibility of the common femoral, superficial femoral, and popliteal veins, as well as the visualized calf veins. Visualized portions of profunda femoral vein and great saphenous vein unremarkable. No filling defects to suggest DVT on grayscale or color Doppler imaging. Doppler waveforms show normal direction of venous flow, normal respiratory plasticity and response to augmentation. Limited views of the contralateral common femoral vein are unremarkable. OTHER None. Limitations: none IMPRESSION: Negative. Electronically Signed   By: Dorise Bullion III M.D.   On: 12/20/2021 17:52   DG Chest 2 View  Result Date: 12/20/2021 CLINICAL DATA:  Shortness of breath.  Bilateral leg swelling. EXAM: CHEST - 2 VIEW COMPARISON:  None Available. FINDINGS: Cardiomegaly. The hila and mediastinum are unremarkable. No pneumothorax. Compression fracture of a lower thoracic vertebral body with associated kyphosis. Small bilateral pleural effusions. Nodularity projected over the lateral left chest favored to represent healed rib fractures. No other acute abnormalities. IMPRESSION: 1. Cardiomegaly. 2. Compression fracture of a lower thoracic vertebral body with associated kyphosis, age indeterminate. 3. Small bilateral pleural effusions. 4. Nodularity projected over the lateral left chest favored to represent healed rib fractures. Electronically Signed   By: Dorise Bullion III M.D.   On: 12/20/2021 15:38    Microbiology: Results for orders placed or performed during the hospital encounter of 12/20/21  SARS Coronavirus 2 by RT PCR (hospital order, performed in [facility] hospital lab) *cepheid single result test* Anterior Nasal Swab     Status: None   Collection Time: 12/20/21  3:43 PM   Specimen: Anterior Nasal Swab  Result Value Ref Range Status   SARS Coronavirus 2 by RT PCR NEGATIVE  NEGATIVE Final    Comment: (NOTE) SARS-CoV-2 target nucleic acids are NOT DETECTED.  The SARS-CoV-2 RNA is generally detectable in upper and lower respiratory specimens during the acute phase of infection. The lowest concentration of SARS-CoV-2 viral copies this assay can detect is 250 copies / mL. A negative result does not preclude SARS-CoV-2 infection and should not be used as the sole basis for treatment or other patient management decisions.  A negative result may occur with improper specimen collection / handling, submission of specimen other than nasopharyngeal swab, presence of viral mutation(s) within the areas targeted by this assay, and inadequate number of viral copies (<250 copies / mL). A negative result must be combined with clinical observations, patient history, and epidemiological information.  Fact Sheet for Patients:   https://www.patel.info/  Fact Sheet for Healthcare Providers: https://hall.com/  This test is not yet approved or  cleared by the [place] FDA and has been authorized for detection and/or diagnosis of SARS-CoV-2 by FDA under an Emergency Use Authorization (EUA).  This EUA will remain in effect (meaning this test can be used) for the duration of the COVID-19 declaration under Section 564(b)(1) of the Act, 21 U.S.C. section 360bbb-3(b)(1), unless the authorization is terminated or revoked sooner.  Performed at [facility], [address] [place], [place] [address]   Blood culture (routine x 2)     Status: None   Collection Time: 12/20/21  4:22 PM   Specimen: BLOOD  Result Value Ref Range Status   Specimen Description BLOOD LEFT ANTECUBITAL  Final   Special Requests   Final    BOTTLES DRAWN AEROBIC AND ANAEROBIC Blood Culture adequate volume   Culture   Final    NO GROWTH 5 DAYS Performed at [facility], [address] [place], [place] [address]    Report Status  12/25/2021 FINAL  Final  Blood culture (routine x 2)     Status: None   Collection Time: 12/20/21  4:27 PM   Specimen: BLOOD  Result Value Ref Range Status   Specimen Description BLOOD RIGHT ANTECUBITAL  Final   Special Requests   Final    BOTTLES DRAWN AEROBIC AND ANAEROBIC Blood Culture adequate volume   Culture   Final    NO GROWTH 5 DAYS Performed at [facility], [address] [place], [place] [address]    Report Status 12/25/2021 FINAL  Final  Culture, blood (Routine X 2) w Reflex to ID Panel     Status: None (Preliminary result)   Collection Time: 12/24/21 12:58 PM   Specimen: BLOOD  Result Value Ref Range Status   Specimen Description BLOOD RIGHT [facility]  Final   Special Requests   Final    BOTTLES DRAWN AEROBIC AND ANAEROBIC Blood Culture adequate volume   Culture   Final    NO GROWTH < 24 HOURS Performed at [facility], [address] [place], [place] [address]    Report Status PENDING  Incomplete  Culture, blood (Routine X 2) w Reflex to ID Panel     Status: None (Preliminary result)   Collection Time: 12/24/21  1:54 PM   Specimen: BLOOD  Result Value Ref Range Status   Specimen Description BLOOD RIGHT ANTECUBITAL  Final   Special Requests   Final    BOTTLES DRAWN AEROBIC AND ANAEROBIC Blood Culture adequate volume   Culture   Final    NO GROWTH < 24 HOURS Performed at [facility], [address] [place], [place] [address]    Report Status PENDING  Incomplete    Labs: CBC: Recent Labs  Lab 12/20/21 1418 12/21/21 0608 12/24/21 0445  WBC 8.6 7.7 10.4  HGB 12.3 10.6* 10.6*  HCT 39.5 33.5* 32.7*  MCV 95.4 91.8 91.1  PLT 234 204 287   Basic Metabolic Panel: Recent Labs  Lab 12/21/21 0608 12/22/21 0448 12/23/21 0533 12/24/21 0445 12/25/21 0603  NA 144 142 139 138 137  K 3.9 3.7 3.6 3.5 3.6  CL 102 99 97* 97* 93*  CO2 32 34* 33* 32 33*  GLUCOSE 188* 177* 216* 204* 133*  BUN 56* 59* 52* 46* 41*  CREATININE 1.47* 1.60*  1.59* 1.42* 1.44*  CALCIUM 8.8* 8.6* 8.7* 8.5* 8.9  MG 1.9  --   --   --   --    Liver Function Tests: No results for input(s): "AST", "ALT", "ALKPHOS", "BILITOT", "PROT", "ALBUMIN" in the last 168 hours. CBG: Recent Labs  Lab 12/24/21 0727 12/24/21 1158 12/24/21 1538 12/24/21 2205 12/25/21 0757  GLUCAP 186* 258* 108* 208* 171*    Discharge time spent: greater than 30 minutes.  This record has been created using Systems analyst. Errors have been sought and corrected,but may not always be located. Such creation errors do not reflect on the standard of care.   Signed: Lorella Nimrod, MD [facility] 12/25/2021

## 2021-12-29 LAB — CULTURE, BLOOD (ROUTINE X 2)
Culture: NO GROWTH
Culture: NO GROWTH
Special Requests: ADEQUATE
Special Requests: ADEQUATE

## 2021-12-30 DIAGNOSIS — I739 Peripheral vascular disease, unspecified: Secondary | ICD-10-CM

## 2021-12-30 DIAGNOSIS — I5032 Chronic diastolic (congestive) heart failure: Secondary | ICD-10-CM

## 2021-12-30 DIAGNOSIS — I879 Disorder of vein, unspecified: Secondary | ICD-10-CM

## 2021-12-30 DIAGNOSIS — I10 Essential (primary) hypertension: Secondary | ICD-10-CM

## 2021-12-30 DIAGNOSIS — E1159 Type 2 diabetes mellitus with other circulatory complications: Secondary | ICD-10-CM

## 2021-12-30 DIAGNOSIS — N1832 Chronic kidney disease, stage 3b: Secondary | ICD-10-CM

## 2022-01-02 ENCOUNTER — Non-Acute Institutional Stay (SKILLED_NURSING_FACILITY): Payer: Medicare Other | Admitting: Student

## 2022-01-02 ENCOUNTER — Encounter: Payer: Self-pay | Admitting: Student

## 2022-01-02 DIAGNOSIS — B3731 Acute candidiasis of vulva and vagina: Secondary | ICD-10-CM

## 2022-01-02 DIAGNOSIS — E1121 Type 2 diabetes mellitus with diabetic nephropathy: Secondary | ICD-10-CM | POA: Diagnosis not present

## 2022-01-02 DIAGNOSIS — L03116 Cellulitis of left lower limb: Secondary | ICD-10-CM | POA: Diagnosis not present

## 2022-01-02 DIAGNOSIS — L03115 Cellulitis of right lower limb: Secondary | ICD-10-CM

## 2022-01-02 LAB — CBC: RBC: 3.72 — AB (ref 3.87–5.11)

## 2022-01-02 LAB — BASIC METABOLIC PANEL
BUN: 53 — AB (ref 4–21)
CO2: 38 — AB (ref 13–22)
Chloride: 101 (ref 99–108)
Creatinine: 1.2 — AB (ref 0.5–1.1)
Glucose: 128
Potassium: 5.1 mEq/L (ref 3.5–5.1)
Sodium: 144 (ref 137–147)

## 2022-01-02 LAB — CBC AND DIFFERENTIAL
HCT: 35 — AB (ref 36–46)
Hemoglobin: 10.8 — AB (ref 12.0–16.0)
Neutrophils Absolute: 6264
Platelets: 380 10*3/uL (ref 150–400)
WBC: 8

## 2022-01-02 LAB — COMPREHENSIVE METABOLIC PANEL: eGFR: 46

## 2022-01-02 NOTE — Progress Notes (Signed)
Location:    [facility] of Service:   Nursing Home Provider:  Dewayne Shorter, MD  Rusty Aus, MD  Patient Care Team: Rusty Aus, MD as PCP - General (Internal Medicine)  Extended Emergency Contact Information Primary Emergency Contact: [facility] Phone: [phone] Relation: Son Secondary Emergency Contact: [facility] Phone: [phone] Relation: Son  Code Status:  DNAR Goals of care: Advanced Directive information    12/20/2021    2:12 PM  Advanced Directives  Does Patient Have a Medical Advance Directive? Unable to assess, patient is non-responsive or altered mental status     No chief complaint on file.   HPI:  Pt is a 88 y.o. female seen today for an acute visit for rash of the perineum. Patient has deferred regular changes from nursing staff for the first week, and is now allowing them to perform routine care. She denies pain or discomfort. Denies dysuria, fevers, or illness symptoms.   Patient has persistent wounds of the bilateral legs for which she will have wound care follow up. She also has CKD and she desires follow up with her kidney doctor.    Past Medical History:  Diagnosis Date   Atrial fibrillation ([place])    Coronary artery disease    Diabetes mellitus without complication ([place])    Hypertension    Meningioma ([place])    Left frontal lobe   Past Surgical History:  Procedure Laterality Date   APPENDECTOMY     CHOLECYSTECTOMY     MELANOMA REMOVAL Right     Allergies  Allergen Reactions   Alendronate Other (See Comments)    Cramping of the jaw   Iodinated Contrast Media Other (See Comments)    Brother has allergy and she wants not to receive it    Statins Other (See Comments)    Fatigue/muscle weakness Fatigue/muscle weakness Fatigue/muscle weakness    Outpatient Encounter Medications as of 01/02/2022  Medication Sig   insulin detemir (LEVEMIR) 100 UNIT/ML injection Inject 5 Units into  the skin in the morning and at bedtime.   LOSARTAN POTASSIUM PO Take 100 mg by mouth daily.   metFORMIN (GLUCOPHAGE) 1000 MG tablet 1,000 mg 2 (two) times daily with a meal.    metoprolol tartrate (LOPRESSOR) 25 MG tablet Take 1 tablet by mouth 2 (two) times daily.   saccharomyces boulardii (FLORASTOR) 250 MG capsule Take 250 mg by mouth daily.   torsemide (DEMADEX) 20 MG tablet Take 40 mg by mouth daily.   No facility-administered encounter medications on file as of 01/02/2022.    Review of Systems  Skin:  Positive for rash.    Immunization History  Administered Date(s) Administered   Influenza, High Dose Seasonal PF 02/24/2019   Influenza-Unspecified 04/14/2017   Moderna Sars-Covid-2 Vaccination 04/25/2020   Pneumococcal Polysaccharide-23 05/19/2014   Varicella 02/12/2017   Zoster Recombinat (Shingrix) 01/09/2018   Pertinent  Health Maintenance Due  Topic Date Due   FOOT EXAM  Never done   OPHTHALMOLOGY EXAM  Never done   INFLUENZA VACCINE  12/10/2021   HEMOGLOBIN A1C  06/22/2022   DEXA SCAN  Completed      12/22/2021    1:00 AM 12/22/2021   11:00 PM 12/23/2021    8:00 PM 12/24/2021    8:30 AM 12/24/2021    9:50 PM  Fall Risk  Patient Fall Risk Level High fall risk High fall risk High fall risk High fall risk High fall risk   Functional Status Survey:  Vitals:   01/02/22 0943  BP: (!) 145/73  Pulse: 80  Resp: (!) 22  Temp: (!) 97.2 F (36.2 C)  SpO2: 93%  Weight: 199 lb (90.3 kg)   Body mass index is 33.63 kg/m. Physical Exam Constitutional:      Appearance: Normal appearance.  HENT:     Nose:     Comments: Nasal canula in place Pulmonary:     Effort: Pulmonary effort is normal.     Comments: Speaking in complete sentences.  Genitourinary:    Comments: Bilateral labia with erythematous patch and satellite papules. Copious amounts of barrier cream on inguinal folds Neurological:     Mental Status: She is alert and oriented to person, place, and time.     Labs reviewed: Recent Labs    12/21/21 0608 12/22/21 0448 12/23/21 0533 12/24/21 0445 12/25/21 0603  NA 144   < > 139 138 137  K 3.9   < > 3.6 3.5 3.6  CL 102   < > 97* 97* 93*  CO2 32   < > 33* 32 33*  GLUCOSE 188*   < > 216* 204* 133*  BUN 56*   < > 52* 46* 41*  CREATININE 1.47*   < > 1.59* 1.42* 1.44*  CALCIUM 8.8*   < > 8.7* 8.5* 8.9  MG 1.9  --   --   --   --    < > = values in this interval not displayed.   No results for input(s): "AST", "ALT", "ALKPHOS", "BILITOT", "PROT", "ALBUMIN" in the last 8760 hours. Recent Labs    12/20/21 1418 12/21/21 0608 12/24/21 0445  WBC 8.6 7.7 10.4  HGB 12.3 10.6* 10.6*  HCT 39.5 33.5* 32.7*  MCV 95.4 91.8 91.1  PLT 234 204 183   No results found for: "TSH" Lab Results  Component Value Date   HGBA1C 7.3 (H) 12/20/2021   Lab Results  Component Value Date   CHOL 188 12/20/2021   HDL 66 12/20/2021   LDLCALC 106 (H) 12/20/2021   TRIG 78 12/20/2021   CHOLHDL 2.8 12/20/2021    Significant Diagnostic Results in last 30 days:  ECHOCARDIOGRAM COMPLETE  Result Date: 12/21/2021    ECHOCARDIOGRAM REPORT   Patient Name:   [name] Date of Exam: 12/21/2021 Medical Rec #:  [id number]   Height:       64.5 in Accession #:    [id number]  Weight:       199.3 lb Date of Birth:  May 10, 1931   BSA:          1.964 m Patient Age:    40 years    BP:           155/66 mmHg Patient Gender: F           HR:           64 bpm. Exam Location:  [facility] Procedure: 2D Echo, Color Doppler and Cardiac Doppler Indications:     I50.31 congestive heart failure-Acute Diastolic  History:         Patient has prior history of Echocardiogram examinations, most                  recent 07/21/2018. CAD, Arrythmias:Atrial Fibrillation; Risk                  Factors:Hypertension and Diabetes.  Sonographer:     Charmayne Sheer Referring Phys:  [place] Diagnosing Phys: Skeet Latch MD  Sonographer Comments: Suboptimal parasternal window.  Image acquisition challenging due to  patient body habitus. IMPRESSIONS  1. Left ventricular ejection fraction, by estimation, is 60 to 65%. The left ventricle has normal function. The left ventricle has no regional wall motion abnormalities. Left ventricular diastolic parameters are indeterminate.  2. Right ventricular systolic function is mildly reduced. The right ventricular size is normal. There is normal pulmonary artery systolic pressure.  3. Left atrial size was severely dilated.  4. Right atrial size was severely dilated.  5. The mitral valve is normal in structure. No evidence of mitral valve regurgitation. No evidence of mitral stenosis. Moderate mitral annular calcification.  6. The aortic valve is tricuspid. There is mild calcification of the aortic valve. There is mild thickening of the aortic valve. Aortic valve regurgitation is not visualized. No aortic stenosis is present.  7. The inferior vena cava is dilated in size with <50% respiratory variability, suggesting right atrial pressure of 15 mmHg. FINDINGS  Left Ventricle: Left ventricular ejection fraction, by estimation, is 60 to 65%. The left ventricle has normal function. The left ventricle has no regional wall motion abnormalities. The left ventricular internal cavity size was normal in size. There is  no left ventricular hypertrophy. Left ventricular diastolic function could not be evaluated due to atrial fibrillation. Left ventricular diastolic parameters are indeterminate. Right Ventricle: The right ventricular size is normal. No increase in right ventricular wall thickness. Right ventricular systolic function is mildly reduced. There is normal pulmonary artery systolic pressure. The tricuspid regurgitant velocity is 2.24 m/s, and with an assumed right atrial pressure of 15 mmHg, the estimated right ventricular systolic pressure is 09.8 mmHg. Left Atrium: Left atrial size was severely dilated. Right Atrium: Right atrial size was severely dilated. Pericardium: There is no evidence  of pericardial effusion. Mitral Valve: The mitral valve is normal in structure. Moderate mitral annular calcification. No evidence of mitral valve regurgitation. No evidence of mitral valve stenosis. Tricuspid Valve: The tricuspid valve is normal in structure. Tricuspid valve regurgitation is trivial. No evidence of tricuspid stenosis. Aortic Valve: The aortic valve is tricuspid. There is mild calcification of the aortic valve. There is mild thickening of the aortic valve. Aortic valve regurgitation is not visualized. No aortic stenosis is present. Aortic valve mean gradient measures 8.0 mmHg. Aortic valve peak gradient measures 14.9 mmHg. Aortic valve area, by VTI measures 0.79 cm. Pulmonic Valve: The pulmonic valve was normal in structure. Pulmonic valve regurgitation is trivial. No evidence of pulmonic stenosis. Aorta: The aortic root is normal in size and structure. Venous: The inferior vena cava is dilated in size with less than 50% respiratory variability, suggesting right atrial pressure of 15 mmHg. IAS/Shunts: No atrial level shunt detected by color flow Doppler.  LEFT VENTRICLE PLAX 2D LVOT diam:     1.60 cm     Diastology LV SV:         31          LV e' medial:    5.66 cm/s LV SV Index:   16          LV E/e' medial:  19.6 LVOT Area:     2.01 cm    LV e' lateral:   6.64 cm/s                            LV E/e' lateral: 16.7  LV Volumes (MOD) LV vol d, MOD A2C: 66.7 ml LV vol d, MOD A4C: 75.9 ml LV vol  s, MOD A2C: 25.3 ml LV vol s, MOD A4C: 31.2 ml LV SV MOD A2C:     41.4 ml LV SV MOD A4C:     75.9 ml LV SV MOD BP:      43.6 ml RIGHT VENTRICLE RV Basal diam:  4.58 cm LEFT ATRIUM             Index        RIGHT ATRIUM           Index LA Vol (A2C):   78.9 ml 40.18 ml/m  RA Area:     28.70 cm LA Vol (A4C):   70.1 ml 35.69 ml/m  RA Volume:   110.00 ml 56.01 ml/m LA Biplane Vol: 76.8 ml 39.11 ml/m  AORTIC VALVE                     PULMONIC VALVE AV Area (Vmax):    0.67 cm      PV Vmax:       0.83 m/s AV  Area (Vmean):   0.70 cm      PV Peak grad:  2.8 mmHg AV Area (VTI):     0.79 cm AV Vmax:           193.00 cm/s AV Vmean:          136.000 cm/s AV VTI:            0.388 m AV Peak Grad:      14.9 mmHg AV Mean Grad:      8.0 mmHg LVOT Vmax:         64.00 cm/s LVOT Vmean:        47.600 cm/s LVOT VTI:          0.153 m LVOT/AV VTI ratio: 0.39  AORTA Ao Root diam: 3.00 cm MITRAL VALVE                TRICUSPID VALVE MV Area (PHT): 3.42 cm     TR Peak grad:   20.1 mmHg MV Decel Time: 222 msec     TR Vmax:        224.00 cm/s MV E velocity: 111.00 cm/s MV A velocity: 34.30 cm/s   SHUNTS MV E/A ratio:  3.24         Systemic VTI:  0.15 m                             Systemic Diam: 1.60 cm Skeet Latch MD Electronically signed by Skeet Latch MD Signature Date/Time: 12/21/2021/4:37:18 PM    Final    US Venous Img Lower Bilateral (DVT)  Result Date: 12/20/2021 CLINICAL DATA:  Pain and edema EXAM: BILATERAL LOWER EXTREMITY VENOUS DOPPLER ULTRASOUND TECHNIQUE: Gray-scale sonography with compression, as well as color and duplex ultrasound, were performed to evaluate the deep venous system(s) from the level of the common femoral vein through the popliteal and proximal calf veins. COMPARISON:  None Available. FINDINGS: VENOUS Normal compressibility of the common femoral, superficial femoral, and popliteal veins, as well as the visualized calf veins. Visualized portions of profunda femoral vein and great saphenous vein unremarkable. No filling defects to suggest DVT on grayscale or color Doppler imaging. Doppler waveforms show normal direction of venous flow, normal respiratory plasticity and response to augmentation. Limited views of the contralateral common femoral vein are unremarkable. OTHER None. Limitations: none IMPRESSION: Negative. Electronically Signed   By: Dorise Bullion III M.D.  On: 12/20/2021 17:52   DG Chest 2 View  Result Date: 12/20/2021 CLINICAL DATA:  Shortness of breath.  Bilateral leg swelling.  EXAM: CHEST - 2 VIEW COMPARISON:  None Available. FINDINGS: Cardiomegaly. The hila and mediastinum are unremarkable. No pneumothorax. Compression fracture of a lower thoracic vertebral body with associated kyphosis. Small bilateral pleural effusions. Nodularity projected over the lateral left chest favored to represent healed rib fractures. No other acute abnormalities. IMPRESSION: 1. Cardiomegaly. 2. Compression fracture of a lower thoracic vertebral body with associated kyphosis, age indeterminate. 3. Small bilateral pleural effusions. 4. Nodularity projected over the lateral left chest favored to represent healed rib fractures. Electronically Signed   By: Dorise Bullion III M.D.   On: 12/20/2021 15:38    Assessment & Plan   There are no diagnoses linked to this encounter. 1. Candidiasis, vulva Patient has some skin breakdown in the perineum. She has appropriate and adequate barrier cream to prevent worsened breakdown, however, now has evidence of yeast infection of the vulva. She initially refused some undergarment changes, and is now more agreeable to treatments given the rash. Will start nystatin powder 100,000 u/g TID for 7 days for treatment of infection. Continue to monitor for worsening symptoms.   2. Cellulitis of both lower extremities Wounds evaluated by Dr. Silvio Pate on 8/21 upon admission and noted stable status post antibiotic therapy. Wound care will continue to evaluate and treat.   3. Macroalbuminuric diabetic nephropathy ([place]) Patient with AKI during most recent hospitalization with increase to 1.47 from baseline creatinine of 0.9. Will repeat labs next week for follow up. She has scheduled follow up with Nephrology in January. Will Send a message to determine need for a sooner based on follow up labs.    Family/ staff Communication: Discussed plan with nursing staff.   Labs/tests ordered:  Will order CBC and [place], MD, [place] [phone]

## 2022-01-02 NOTE — Assessment & Plan Note (Signed)
Patient has some skin breakdown in the perineum. She has appropriate and adequate barrier cream to prevent worsened breakdown, however, now has evidence of yeast infection of the vulva. She initially refused some undergarment changes, and is now more agreeable to treatments given the rash. Will start nystatin powder 100,000 u/g TID for 7 days for treatment of infection. Continue to monitor for worsening symptoms.

## 2022-01-02 NOTE — Assessment & Plan Note (Addendum)
Patient with AKI during most recent hospitalization. Repeat labs upon admission. She has scheduled follow up with Nephrology in January. Will Send a message to determine need for a sooner appointment given recent changes in function.

## 2022-01-02 NOTE — Assessment & Plan Note (Signed)
Wounds evaluated by Dr. Silvio Pate on 8/21 upon admission and noted stable status post antibiotic therapy. Wound care will continue to evaluate and treat.

## 2022-01-03 ENCOUNTER — Emergency Department: Payer: Medicare Other

## 2022-01-03 ENCOUNTER — Inpatient Hospital Stay
Admission: EM | Admit: 2022-01-03 | Discharge: 2022-01-07 | DRG: 291 | Disposition: A | Discharge status: Skilled Nursing Facility | Payer: Medicare Other | Source: Skilled Nursing Facility | Attending: Internal Medicine | Admitting: Internal Medicine

## 2022-01-03 ENCOUNTER — Telehealth: Payer: Self-pay | Admitting: Student

## 2022-01-03 DIAGNOSIS — I4819 Other persistent atrial fibrillation: Secondary | ICD-10-CM | POA: Diagnosis present

## 2022-01-03 DIAGNOSIS — Z7984 Long term (current) use of oral hypoglycemic drugs: Secondary | ICD-10-CM

## 2022-01-03 DIAGNOSIS — E1165 Type 2 diabetes mellitus with hyperglycemia: Secondary | ICD-10-CM | POA: Diagnosis present

## 2022-01-03 DIAGNOSIS — I509 Heart failure, unspecified: Principal | ICD-10-CM

## 2022-01-03 DIAGNOSIS — I2510 Atherosclerotic heart disease of native coronary artery without angina pectoris: Secondary | ICD-10-CM | POA: Diagnosis present

## 2022-01-03 DIAGNOSIS — I5A Non-ischemic myocardial injury (non-traumatic): Secondary | ICD-10-CM | POA: Diagnosis present

## 2022-01-03 DIAGNOSIS — Z86011 Personal history of benign neoplasm of the brain: Secondary | ICD-10-CM

## 2022-01-03 DIAGNOSIS — E1122 Type 2 diabetes mellitus with diabetic chronic kidney disease: Secondary | ICD-10-CM | POA: Diagnosis present

## 2022-01-03 DIAGNOSIS — I5033 Acute on chronic diastolic (congestive) heart failure: Secondary | ICD-10-CM | POA: Diagnosis present

## 2022-01-03 DIAGNOSIS — R609 Edema, unspecified: Secondary | ICD-10-CM

## 2022-01-03 DIAGNOSIS — Z66 Do not resuscitate: Secondary | ICD-10-CM | POA: Diagnosis present

## 2022-01-03 DIAGNOSIS — Z515 Encounter for palliative care: Secondary | ICD-10-CM | POA: Diagnosis not present

## 2022-01-03 DIAGNOSIS — E1129 Type 2 diabetes mellitus with other diabetic kidney complication: Secondary | ICD-10-CM | POA: Diagnosis present

## 2022-01-03 DIAGNOSIS — Z823 Family history of stroke: Secondary | ICD-10-CM | POA: Diagnosis not present

## 2022-01-03 DIAGNOSIS — Z20822 Contact with and (suspected) exposure to covid-19: Secondary | ICD-10-CM | POA: Diagnosis present

## 2022-01-03 DIAGNOSIS — Z801 Family history of malignant neoplasm of trachea, bronchus and lung: Secondary | ICD-10-CM | POA: Diagnosis not present

## 2022-01-03 DIAGNOSIS — L03116 Cellulitis of left lower limb: Secondary | ICD-10-CM | POA: Diagnosis present

## 2022-01-03 DIAGNOSIS — I10 Essential (primary) hypertension: Secondary | ICD-10-CM | POA: Diagnosis not present

## 2022-01-03 DIAGNOSIS — Z888 Allergy status to other drugs, medicaments and biological substances status: Secondary | ICD-10-CM

## 2022-01-03 DIAGNOSIS — I130 Hypertensive heart and chronic kidney disease with heart failure and stage 1 through stage 4 chronic kidney disease, or unspecified chronic kidney disease: Secondary | ICD-10-CM | POA: Diagnosis not present

## 2022-01-03 DIAGNOSIS — N1831 Chronic kidney disease, stage 3a: Secondary | ICD-10-CM | POA: Diagnosis present

## 2022-01-03 DIAGNOSIS — Z79899 Other long term (current) drug therapy: Secondary | ICD-10-CM | POA: Diagnosis not present

## 2022-01-03 DIAGNOSIS — E041 Nontoxic single thyroid nodule: Secondary | ICD-10-CM | POA: Diagnosis present

## 2022-01-03 DIAGNOSIS — E874 Mixed disorder of acid-base balance: Secondary | ICD-10-CM

## 2022-01-03 DIAGNOSIS — Z794 Long term (current) use of insulin: Secondary | ICD-10-CM

## 2022-01-03 DIAGNOSIS — I4891 Unspecified atrial fibrillation: Secondary | ICD-10-CM | POA: Diagnosis present

## 2022-01-03 DIAGNOSIS — Z7189 Other specified counseling: Secondary | ICD-10-CM | POA: Diagnosis not present

## 2022-01-03 LAB — CBC WITH DIFFERENTIAL/PLATELET
Abs Immature Granulocytes: 0.08 10*3/uL — ABNORMAL HIGH (ref 0.00–0.07)
Basophils Absolute: 0 10*3/uL (ref 0.0–0.1)
Basophils Relative: 1 %
Eosinophils Absolute: 0.1 10*3/uL (ref 0.0–0.5)
Eosinophils Relative: 1 %
HCT: 36.7 % (ref 36.0–46.0)
Hemoglobin: 10.9 g/dL — ABNORMAL LOW (ref 12.0–15.0)
Immature Granulocytes: 1 %
Lymphocytes Relative: 11 %
Lymphs Abs: 1 10*3/uL (ref 0.7–4.0)
MCH: 28.8 pg (ref 26.0–34.0)
MCHC: 29.7 g/dL — ABNORMAL LOW (ref 30.0–36.0)
MCV: 97.1 fL (ref 80.0–100.0)
Monocytes Absolute: 0.5 10*3/uL (ref 0.1–1.0)
Monocytes Relative: 5 %
Neutro Abs: 7 10*3/uL (ref 1.7–7.7)
Neutrophils Relative %: 81 %
Platelets: 409 10*3/uL — ABNORMAL HIGH (ref 150–400)
RBC: 3.78 MIL/uL — ABNORMAL LOW (ref 3.87–5.11)
RDW: 17.1 % — ABNORMAL HIGH (ref 11.5–15.5)
WBC: 8.6 10*3/uL (ref 4.0–10.5)
nRBC: 0 % (ref 0.0–0.2)

## 2022-01-03 LAB — COMPREHENSIVE METABOLIC PANEL
ALT: 14 U/L (ref 0–44)
AST: 19 U/L (ref 15–41)
Albumin: 2.6 g/dL — ABNORMAL LOW (ref 3.5–5.0)
Alkaline Phosphatase: 69 U/L (ref 38–126)
Anion gap: 12 (ref 5–15)
BUN: 51 mg/dL — ABNORMAL HIGH (ref 8–23)
CO2: 33 mmol/L — ABNORMAL HIGH (ref 22–32)
Calcium: 9.5 mg/dL (ref 8.9–10.3)
Chloride: 100 mmol/L (ref 98–111)
Creatinine, Ser: 1.08 mg/dL — ABNORMAL HIGH (ref 0.44–1.00)
GFR, Estimated: 49 mL/min — ABNORMAL LOW (ref >60)
Glucose, Bld: 171 mg/dL — ABNORMAL HIGH (ref 70–99)
Potassium: 4.8 mmol/L (ref 3.5–5.1)
Sodium: 145 mmol/L (ref 135–145)
Total Bilirubin: 0.8 mg/dL (ref 0.3–1.2)
Total Protein: 6.2 g/dL — ABNORMAL LOW (ref 6.5–8.1)

## 2022-01-03 LAB — TROPONIN I (HIGH SENSITIVITY): Troponin I (High Sensitivity): 30 ng/L — ABNORMAL HIGH (ref <18)

## 2022-01-03 LAB — CBG MONITORING, ED: Glucose-Capillary: 149 mg/dL — ABNORMAL HIGH (ref 70–99)

## 2022-01-03 LAB — BRAIN NATRIURETIC PEPTIDE: B Natriuretic Peptide: 779.4 pg/mL — ABNORMAL HIGH (ref 0.0–100.0)

## 2022-01-03 MED ORDER — DM-GUAIFENESIN ER 30-600 MG PO TB12: 1.0000 | ORAL_TABLET | Freq: Two times a day (BID) | ORAL | Status: DC | PRN

## 2022-01-03 MED ORDER — FUROSEMIDE 10 MG/ML IJ SOLN
40.0000 mg | Freq: Two times a day (BID) | INTRAMUSCULAR | Status: DC
  Administered 2022-01-03 – 2022-01-06 (×7): 40 mg via INTRAVENOUS
  Filled 2022-01-03 (×7): qty 4

## 2022-01-03 MED ORDER — INSULIN ASPART 100 UNIT/ML IJ SOLN
0.0000 [IU] | Freq: Three times a day (TID) | INTRAMUSCULAR | Status: DC
  Administered 2022-01-04: 2 [IU] via SUBCUTANEOUS
  Administered 2022-01-04: 1 [IU] via SUBCUTANEOUS
  Administered 2022-01-04 – 2022-01-05 (×2): 3 [IU] via SUBCUTANEOUS
  Administered 2022-01-05: 5 [IU] via SUBCUTANEOUS
  Administered 2022-01-05: 1 [IU] via SUBCUTANEOUS
  Administered 2022-01-06: 5 [IU] via SUBCUTANEOUS
  Administered 2022-01-06: 3 [IU] via SUBCUTANEOUS
  Administered 2022-01-06 – 2022-01-07 (×2): 1 [IU] via SUBCUTANEOUS
  Filled 2022-01-03 (×10): qty 1

## 2022-01-03 MED ORDER — ENOXAPARIN SODIUM 60 MG/0.6ML IJ SOSY
0.5000 mg/kg | PREFILLED_SYRINGE | INTRAMUSCULAR | Status: DC
  Administered 2022-01-03 – 2022-01-06 (×4): 45 mg via SUBCUTANEOUS
  Filled 2022-01-03 (×4): qty 0.6

## 2022-01-03 MED ORDER — HYDRALAZINE HCL 20 MG/ML IJ SOLN: 5.0000 mg | INTRAMUSCULAR | Status: DC | PRN

## 2022-01-03 MED ORDER — INSULIN GLARGINE-YFGN 100 UNIT/ML ~~LOC~~ SOLN
3.0000 [IU] | Freq: Two times a day (BID) | SUBCUTANEOUS | Status: DC
  Administered 2022-01-03 – 2022-01-05 (×4): 3 [IU] via SUBCUTANEOUS
  Filled 2022-01-03 (×5): qty 0.03

## 2022-01-03 MED ORDER — FUROSEMIDE 10 MG/ML IJ SOLN
40.0000 mg | Freq: Once | INTRAMUSCULAR | Status: AC
  Administered 2022-01-03: 40 mg via INTRAVENOUS
  Filled 2022-01-03: qty 4

## 2022-01-03 MED ORDER — INSULIN ASPART 100 UNIT/ML IJ SOLN
0.0000 [IU] | Freq: Every day | INTRAMUSCULAR | Status: DC
  Administered 2022-01-04: 2 [IU] via SUBCUTANEOUS
  Administered 2022-01-05: 3 [IU] via SUBCUTANEOUS
  Administered 2022-01-06: 2 [IU] via SUBCUTANEOUS
  Filled 2022-01-03 (×3): qty 1

## 2022-01-03 MED ORDER — ACETAMINOPHEN 325 MG PO TABS
650.0000 mg | ORAL_TABLET | Freq: Four times a day (QID) | ORAL | Status: DC | PRN
  Administered 2022-01-05 – 2022-01-06 (×2): 650 mg via ORAL
  Filled 2022-01-03 (×2): qty 2

## 2022-01-03 MED ORDER — ALBUTEROL SULFATE (2.5 MG/3ML) 0.083% IN NEBU: 3.0000 mL | INHALATION_SOLUTION | RESPIRATORY_TRACT | Status: DC | PRN

## 2022-01-03 MED ORDER — ASPIRIN 81 MG PO TBEC
81.0000 mg | DELAYED_RELEASE_TABLET | Freq: Every day | ORAL | Status: DC
  Administered 2022-01-03 – 2022-01-07 (×5): 81 mg via ORAL
  Filled 2022-01-03 (×4): qty 1

## 2022-01-03 MED ORDER — SACCHAROMYCES BOULARDII 250 MG PO CAPS
250.0000 mg | ORAL_CAPSULE | Freq: Every day | ORAL | Status: DC
  Filled 2022-01-03 (×4): qty 1

## 2022-01-03 MED ORDER — METOPROLOL TARTRATE 25 MG PO TABS
25.0000 mg | ORAL_TABLET | Freq: Two times a day (BID) | ORAL | Status: DC
  Administered 2022-01-03 – 2022-01-07 (×7): 25 mg via ORAL
  Filled 2022-01-03 (×8): qty 1

## 2022-01-03 MED ORDER — ONDANSETRON HCL 4 MG/2ML IJ SOLN: 4.0000 mg | Freq: Three times a day (TID) | INTRAMUSCULAR | Status: DC | PRN

## 2022-01-03 MED ORDER — LOSARTAN POTASSIUM 50 MG PO TABS
100.0000 mg | ORAL_TABLET | Freq: Every day | ORAL | Status: DC
  Administered 2022-01-03 – 2022-01-07 (×5): 100 mg via ORAL
  Filled 2022-01-03 (×4): qty 2

## 2022-01-03 NOTE — Assessment & Plan Note (Signed)
-  see above 

## 2022-01-03 NOTE — ED Triage Notes (Signed)
Pt at [facility], needs lasix , bilateral leg swelling , leg pain

## 2022-01-03 NOTE — Assessment & Plan Note (Signed)
History of CAD and myocardial injury due to CHF exacerbation: Troponin level 30 which is at recent baseline.  Patient had recent baseline troponin 30-34.  No chest pain. -Continue aspirin -Patient is allergic to statin

## 2022-01-03 NOTE — ED Notes (Signed)
Called [facility] for transfer spoke to [place],  [place] powershared [phone]

## 2022-01-03 NOTE — ED Notes (Signed)
[facility] called per medical director Dr. Victorio Palm they are unable to process request due to full capacity  1614

## 2022-01-03 NOTE — ED Provider Notes (Cosign Needed Addendum)
[facility] Provider Note    Event Date/Time   First MD Initiated Contact with Patient 01/03/22 1235     (approximate)   History   Leg Swelling ([place] swelling , bilateral leg from [facility]  , per ems pt needs iv lasix  )   HPI  [name] is a 88 y.o. female with history of diabetes, A-fib, CAD, stage II kidney disease presents with fluid overload.  Patient takes torsemide 20 mg daily.  Was sent here from [facility] as the swelling of the lower legs has increased and she is having more pain.  States that her nephrologist is at [facility].  She denies chest pain.  States shortness of breath is no worse than normal.  No fever or chills      Physical Exam   Triage Vital Signs: ED Triage Vitals  Enc Vitals Group     BP 01/03/22 1233 (!) 140/79     Pulse Rate 01/03/22 1233 72     Resp 01/03/22 1227 18     Temp 01/03/22 1227 98.4 F (36.9 C)     Temp Source 01/03/22 1227 Oral     SpO2 01/03/22 1227 90 %     Weight --      Height --      Head Circumference --      Peak Flow --      Pain Score 01/03/22 1229 8     Pain Loc --      Pain Edu? --      Excl. in [place]? --     Most recent vital signs: Vitals:   01/03/22 1233 01/03/22 1635  BP: (!) 140/79   Pulse: 72   Resp:    Temp:  98.6 F (37 C)  SpO2: 97%      General: Awake, no distress.   CV:  Good peripheral perfusion. regular rate and  rhythm Resp:  Normal effort. Lungs cta Abd:  No distention.   Other:      ED Results / Procedures / Treatments   Labs (all labs ordered are listed, but only abnormal results are displayed) Labs Reviewed  COMPREHENSIVE METABOLIC PANEL - Abnormal; Notable for the following components:      Result Value   CO2 33 (*)    Glucose, Bld 171 (*)    BUN 51 (*)    Creatinine, Ser 1.08 (*)    Total Protein 6.2 (*)    Albumin 2.6 (*)    GFR, Estimated 49 (*)    All other components within normal limits  CBC WITH DIFFERENTIAL/PLATELET - Abnormal; Notable for  the following components:   RBC 3.78 (*)    Hemoglobin 10.9 (*)    MCHC 29.7 (*)    RDW 17.1 (*)    Platelets 409 (*)    Abs Immature Granulocytes 0.08 (*)    All other components within normal limits  TROPONIN I (HIGH SENSITIVITY) - Abnormal; Notable for the following components:   Troponin I (High Sensitivity) 30 (*)    All other components within normal limits  BRAIN NATRIURETIC PEPTIDE     EKG  EKG   RADIOLOGY Chest x-ray    PROCEDURES:   .Critical Care  Performed by: Versie Starks, PA-C Authorized by: Versie Starks, PA-C   Critical care provider statement:    Critical care time (minutes):  45   Critical care time was exclusive of:  Separately billable procedures and treating other patients   Critical  care was necessary to treat or prevent imminent or life-threatening deterioration of the following conditions:  Cardiac failure   Critical care was time spent personally by me on the following activities:  Blood draw for specimens, development of treatment plan with patient or surrogate, evaluation of patient's response to treatment, examination of patient, interpretation of cardiac output measurements, obtaining history from patient or surrogate, ordering and performing treatments and interventions, ordering and review of laboratory studies, ordering and review of radiographic studies, pulse oximetry, re-evaluation of patient's condition and review of old charts   Care discussed with: admitting provider      [place] ED: Medications  albuterol (PROVENTIL) (2.5 MG/3ML) 0.083% nebulizer solution 3 mL (has no administration in time range)  dextromethorphan-guaiFENesin (MUCINEX DM) 30-600 MG per 12 hr tablet 1 tablet (has no administration in time range)  ondansetron (ZOFRAN) injection 4 mg (has no administration in time range)  acetaminophen (TYLENOL) tablet 650 mg (has no administration in time range)  hydrALAZINE (APRESOLINE) injection 5 mg (has no  administration in time range)  furosemide (LASIX) injection 40 mg (has no administration in time range)  insulin aspart (novoLOG) injection 0-9 Units ( Subcutaneous Not Given 01/03/22 1559)  insulin aspart (novoLOG) injection 0-5 Units (has no administration in time range)  losartan (COZAAR) tablet 100 mg (100 mg Oral Given 01/03/22 1615)  metoprolol tartrate (LOPRESSOR) tablet 25 mg (has no administration in time range)  saccharomyces boulardii (FLORASTOR) capsule 250 mg (has no administration in time range)  insulin glargine-yfgn (SEMGLEE) injection 3 Units (has no administration in time range)  enoxaparin (LOVENOX) injection 45 mg (has no administration in time range)  furosemide (LASIX) injection 40 mg (40 mg Intravenous Given 01/03/22 1257)     IMPRESSION / MDM / [place] / ED COURSE  I reviewed the triage vital signs and the nursing notes.                              Differential diagnosis includes, but is not limited to, CHF, CKD, electrolyte abnormality, dependent edema  Patient's presentation is most consistent with severe exacerbation of chronic illness.   Labs and imaging ordered.  Lasix 40 mg IV ordered.  Nursing staff instructed to place patient on pure wick   Patient's troponin is elevated but this is in the patient's normal trend.  2 weeks ago her troponin was elevated at 30 and remained elevated for 4 different blood draws.  Her comprehensive metabolic panel shows some kidney disease which is chronic her BUN is more elevated than it was 2 weeks ago but her creatinine is decreased BNP is still pending  Chest x-ray was independently reviewed and interpreted by me as having vascular congestion along with interstitial edema  EKG shows A-fib which is a chronic problem for the patient, see physician read  Discussed with Dr. Kerman Passey, consult hospitalist for admission At the patient's request I did contact [facility] for possible transfer.  They are at capacity and on  diversion.  I did notify Dr. Blaine Hamper of the same  FINAL CLINICAL IMPRESSION(S) / ED DIAGNOSES   Final diagnoses:  Acute congestive heart failure, unspecified heart failure type ([place])  Edema, unspecified type     Rx / DC Orders   ED Discharge Orders     None        Note:  This document was prepared using Dragon voice recognition software and may include unintentional dictation errors.  Versie Starks, PA-C 01/03/22 1434    Caryn Section Linden Dolin, PA-C 01/03/22 1636    Harvest Dark, MD 01/06/22 1146

## 2022-01-03 NOTE — Assessment & Plan Note (Signed)
Not on anticoagulant.  Heart rate 80 -Continue metoprolol

## 2022-01-03 NOTE — Assessment & Plan Note (Signed)
Stable -f/u with BMP 

## 2022-01-03 NOTE — H&P (Signed)
History and Physical    [name] ZOX:[id number] DOB: 03-05-1931 DOA: 01/03/2022  Referring MD/NP/PA:   PCP: Pcp, No   Patient coming from:  The patient is coming from SNF  Chief Complaint:   HPI: [name] is a 88 y.o. female with medical history significant of  dCHF, hypertension, diabetes mellitus, meningioma in the left frontal area, atrial fibrillation not on anticoagulants, CAD, CKD stage IIIa, PAD, who presents with shortness of breath and bilateral leg swelling.  Patient was recently hospitalized from 8/11 - 8/16 due to CHF exacerbation.  Patient had 2D echo on 12/21/2021 which showed EF of 60 to 65%.  Patient was discharged to nursing home, and currently receiving torsemide 40 mg daily there.  She states that her leg edema has been progressively worsening in the past several days.  Her shortness breath has also been progressively worsening.  She denies chest pain, fever or chills.  No nausea, vomiting, diarrhea or abdominal pain.  No symptoms of UTI.  Data reviewed independently and ED Course: pt was found to have BNP 779, troponin level 30, creatinine 1.08, BUN 51, GFR 49, temperature normal, blood pressure 140/79, heart rate 80, RR 20, oxygen saturation 90% on RA, which improved to 97% on 3 L oxygen.  Chest x-ray showed cardiomegaly and vascular congestion with layering right pleural effusion.  Patient is admitted to telemetry bed as inpatient.   EKG: I have personally reviewed.  Atrial fibrillation, QTc 4 6, RAD, poor R wave progression, right bundle blockade, low voltage.   Review of Systems:   General: no fevers, chills, no body weight gain, has fatigue HEENT: no blurry vision, hearing changes or sore throat Respiratory: has dyspnea, coughing, no wheezing CV: no chest pain, no palpitations GI: no nausea, vomiting, abdominal pain, diarrhea, constipation GU: no dysuria, burning on urination, increased urinary frequency, hematuria  Ext: has leg edema Neuro: no unilateral  weakness, numbness, or tingling, no vision change or hearing loss Skin: no rash, no skin tear. MSK: No muscle spasm, no deformity, no limitation of range of movement in spin Heme: No easy bruising.  Travel history: No recent long distant travel.   Allergy:  Allergies  Allergen Reactions   Alendronate Other (See Comments)    Cramping of the jaw   Iodinated Contrast Media Other (See Comments)    Brother has allergy and she wants not to receive it    Statins Other (See Comments)    Fatigue/muscle weakness Fatigue/muscle weakness Fatigue/muscle weakness    Past Medical History:  Diagnosis Date   Atrial fibrillation ([place])    Coronary artery disease    Diabetes mellitus without complication ([place])    Hypertension    Meningioma ([place])    Left frontal lobe    Past Surgical History:  Procedure Laterality Date   APPENDECTOMY     CHOLECYSTECTOMY     MELANOMA REMOVAL Right     Social History:  reports that she has never smoked. She has never used smokeless tobacco. She reports current alcohol use of about 1.0 standard drink of alcohol per week. She reports that she does not use drugs.  Family History:  Family History  Problem Relation Age of Onset   Stroke Brother    Lung cancer Brother      Prior to Admission medications   Medication Sig Start Date End Date Taking? Authorizing Provider  insulin detemir (LEVEMIR) 100 UNIT/ML injection Inject 5 Units into the skin in the morning and at bedtime. 05/22/21   [provider]  LOSARTAN POTASSIUM PO Take 100 mg by mouth daily.    [provider]  metFORMIN (GLUCOPHAGE) 1000 MG tablet 1,000 mg 2 (two) times daily with a meal.  04/13/15   [provider]  metoprolol tartrate (LOPRESSOR) 25 MG tablet Take 1 tablet by mouth 2 (two) times daily. 05/22/21   [provider]  saccharomyces boulardii (FLORASTOR) 250 MG capsule Take 250 mg by mouth daily.    [provider]  torsemide (DEMADEX) 20 MG  tablet Take 40 mg by mouth daily. 05/22/21 05/22/22  [provider]    Physical Exam: Vitals:   01/03/22 1227 01/03/22 1233 01/03/22 1635  BP:  (!) 140/79   Pulse:  72   Resp: 18    Temp: 98.4 F (36.9 C)  98.6 F (37 C)  TempSrc: Oral  Oral  SpO2: 90% 97%    General: Not in acute distress HEENT:       Eyes: PERRL, EOMI, no scleral icterus.       ENT: No discharge from the ears and nose, no pharynx injection, no tonsillar enlargement.        Neck: positive JVD, no bruit, no mass felt. Heme: No neck lymph node enlargement. Cardiac: S1/S2, irregularly irregular rhythm, no gallops or rubs. Respiratory: has fine crackles bilaterally GI: Soft, nondistended, nontender, no rebound pain, no organomegaly, BS present. GU: No hematuria Ext: 2+ pitting leg edema bilaterally. 1+DP/PT pulse bilaterally. Musculoskeletal: No joint deformities, No joint redness or warmth, no limitation of ROM in spin. Skin: No rashes.  Neuro: Alert, oriented X3, cranial nerves II-XII grossly intact, moves all extremities normally. Psych: Patient is not psychotic, no suicidal or hemocidal ideation.  Labs on Admission: I have personally reviewed following labs and imaging studies  CBC: Recent Labs  Lab 01/03/22 1249  WBC 8.6  NEUTROABS 7.0  HGB 10.9*  HCT 36.7  MCV 97.1  PLT 454*   Basic Metabolic Panel: Recent Labs  Lab 01/03/22 1249  NA 145  K 4.8  CL 100  CO2 33*  GLUCOSE 171*  BUN 51*  CREATININE 1.08*  CALCIUM 9.5   GFR: Estimated Creatinine Clearance: 38.9 mL/min (A) (by C-G formula based on SCr of 1.08 mg/dL (H)). Liver Function Tests: Recent Labs  Lab 01/03/22 1249  AST 19  ALT 14  ALKPHOS 69  BILITOT 0.8  PROT 6.2*  ALBUMIN 2.6*   No results for input(s): "LIPASE", "AMYLASE" in the last 168 hours. No results for input(s): "AMMONIA" in the last 168 hours. Coagulation Profile: No results for input(s): "INR", "PROTIME" in the last 168 hours. Cardiac Enzymes: No  results for input(s): "CKTOTAL", "CKMB", "CKMBINDEX", "TROPONINI" in the last 168 hours. BNP (last 3 results) No results for input(s): "PROBNP" in the last 8760 hours. HbA1C: No results for input(s): "HGBA1C" in the last 72 hours. CBG: No results for input(s): "GLUCAP" in the last 168 hours. Lipid Profile: No results for input(s): "CHOL", "HDL", "LDLCALC", "TRIG", "CHOLHDL", "LDLDIRECT" in the last 72 hours. Thyroid Function Tests: No results for input(s): "TSH", "T4TOTAL", "FREET4", "T3FREE", "THYROIDAB" in the last 72 hours. Anemia Panel: No results for input(s): "VITAMINB12", "FOLATE", "FERRITIN", "TIBC", "IRON", "RETICCTPCT" in the last 72 hours. Urine analysis:    Component Value Date/Time   COLORURINE YELLOW (A) 12/24/2021 1212   APPEARANCEUR CLEAR (A) 12/24/2021 1212   LABSPEC 1.012 12/24/2021 1212   PHURINE 5.0 12/24/2021 1212   GLUCOSEU NEGATIVE 12/24/2021 1212   HGBUR NEGATIVE 12/24/2021 1212   BILIRUBINUR NEGATIVE  12/24/2021 1212   [place] 12/24/2021 1212   [place] 12/24/2021 1212   NITRITE NEGATIVE 12/24/2021 1212   LEUKOCYTESUR NEGATIVE 12/24/2021 1212   Sepsis Labs: '@LABRCNTIP'$ (procalcitonin:4,lacticidven:4) )No results found for this or any previous visit (from the past 240 hour(s)).   Radiological Exams on Admission: DG Chest Portable 1 View  Result Date: 01/03/2022 CLINICAL DATA:  Provided history: CHF, leg swelling. EXAM: PORTABLE CHEST 1 VIEW COMPARISON:  Prior chest radiographs 12/20/2021 and earlier. FINDINGS: Patient rotation to the right. Cardiomegaly. Aortic atherosclerosis. Central pulmonary vascular congestion and interstitial edema. Hazy opacity at the right lung base compatible with a layering pleural effusion. Trace left pleural effusion. Bibasilar atelectasis. No evidence of pneumothorax. Degenerative changes of the spine. IMPRESSION: Cardiomegaly with central pulmonary vascular congestion and interstitial edema. Layering right  pleural effusion. Trace left pleural effusion. Bibasilar atelectasis. Aortic Atherosclerosis (ICD10-I70.0). Electronically Signed   By: Kellie Simmering D.O.   On: 01/03/2022 13:22      Assessment/Plan Principal Problem:   Acute on chronic diastolic CHF (congestive heart failure) (HCC) Active Problems:   Coronary artery disease   Myocardial injury   Essential (primary) hypertension   Persistent atrial fibrillation (HCC)   Type II diabetes mellitus with renal manifestations (HCC)   Chronic kidney disease, stage 3a (HCC)   Assessment and Plan: * Acute on chronic diastolic CHF (congestive heart failure) ([place]) Today: 12/21/2021 showed EF 60 to 65%.  Patient has 2+ leg edema, positive JVD, crackles on auscultation, elevated BNP 779, clinically consistent with CHF exacerbation.  Patient has 2 L new oxygen requirement, but no acute respiratory distress, does not meet criteria for acute respiratory failure.  Patient has cardiologist and nephrologist in [place], wants to be transferred to [facility].  ED physician contacted [facility], but they do not have a bed.  -will admit to tele bed as inpatient -Lasix 40 mg bid by IV -Daily weights -strict I/O's -Low salt diet -Fluid restriction -Obtain REDs Vest reading  Myocardial injury -see above  Coronary artery disease History of CAD and myocardial injury due to CHF exacerbation: Troponin level 30 which is at recent baseline.  Patient had recent baseline troponin 30-34.  No chest pain. -Continue aspirin -Patient is allergic to statin  Essential (primary) hypertension -IV hydralazine as needed -metoprolo -pt is also on IV Lasix  Persistent atrial fibrillation (HCC) Not on anticoagulant.  Heart rate 80 -Continue metoprolol  Type II diabetes mellitus with renal manifestations (HCC) Recent A1c 7.3, poorly controlled.  Patient taking metformin and Levemir 5 units twice daily -Glargine insulin 3 unit twice daily -Sliding scale insulin  Chronic kidney  disease, stage 3a (HCC) Stable. -f/u with BMP          DVT ppx:  SQ Lovenox  Code Status: DNR per pt (I discussed with patient and explained the meaning of CODE STATUS in the presence of nurse. Patient wants to be DNR).  Family Communication: not done, no family member is at bed side.    Disposition Plan:  Anticipate discharge back to previous environment, SNF  Consults called:  none  Admission status and Level of care: Telemetry Cardiac:    as inpt          Dispo: The patient is from: SNF              Anticipated d/c is to: SNF              Anticipated d/c date is: 2 days  Patient currently is not medically stable to d/c.    Severity of Illness:  The appropriate patient status for this patient is INPATIENT. Inpatient status is judged to be reasonable and necessary in order to provide the required intensity of service to ensure the patient's safety. The patient's presenting symptoms, physical exam findings, and initial radiographic and laboratory data in the context of their chronic comorbidities is felt to place them at high risk for further clinical deterioration. Furthermore, it is not anticipated that the patient will be medically stable for discharge from the hospital within 2 midnights of admission.   * I certify that at the point of admission it is my clinical judgment that the patient will require inpatient hospital care spanning beyond 2 midnights from the point of admission due to high intensity of service, high risk for further deterioration and high frequency of surveillance required.*       Date of Service 01/03/2022    Ivor Costa [facility]   If 7PM-7AM, please contact night-coverage [email or web address] 01/03/2022, 4:59 PM

## 2022-01-03 NOTE — Assessment & Plan Note (Signed)
Recent A1c 7.3, poorly controlled.  Patient taking metformin and Levemir 5 units twice daily -Glargine insulin 3 unit twice daily -Sliding scale insulin

## 2022-01-03 NOTE — Telephone Encounter (Signed)
Received call as on call provider that patient has had increased leg swelling, increased work of breathing. She is now requiring oxygen at rest. Of note, patient recently discharged from hospital for volume overload. Will plan to transfer patient to hospital for stabilization. Of note, patient has specialist care at [facility] and has voiced desire for transfer there once she is stabilized.   Tomasa Rand, MD, [facility] [phone]

## 2022-01-03 NOTE — Assessment & Plan Note (Addendum)
Today: 12/21/2021 showed EF 60 to 65%.  Patient has 2+ leg edema, positive JVD, crackles on auscultation, elevated BNP 779, clinically consistent with CHF exacerbation.  Patient has 2 L new oxygen requirement, but no acute respiratory distress, does not meet criteria for acute respiratory failure.  Patient has cardiologist and nephrologist in [place], wants to be transferred to [facility].  ED physician contacted [facility], but they do not have a bed. No urinary output recorded, renal functions improving.  Currently better than her baseline. -Continue Lasix 40 mg bid by IV -Daily weights -strict I/O's -Low salt diet -Fluid restriction -Obtain REDs Vest reading

## 2022-01-03 NOTE — Assessment & Plan Note (Signed)
-  IV hydralazine as needed -metoprolo -pt is also on IV Lasix

## 2022-01-04 ENCOUNTER — Other Ambulatory Visit: Payer: Self-pay

## 2022-01-04 DIAGNOSIS — I5033 Acute on chronic diastolic (congestive) heart failure: Secondary | ICD-10-CM | POA: Diagnosis not present

## 2022-01-04 LAB — BASIC METABOLIC PANEL
Anion gap: 9 (ref 5–15)
BUN: 48 mg/dL — ABNORMAL HIGH (ref 8–23)
CO2: 35 mmol/L — ABNORMAL HIGH (ref 22–32)
Calcium: 9.3 mg/dL (ref 8.9–10.3)
Chloride: 100 mmol/L (ref 98–111)
Creatinine, Ser: 0.91 mg/dL (ref 0.44–1.00)
GFR, Estimated: >60 mL/min (ref >60)
Glucose, Bld: 138 mg/dL — ABNORMAL HIGH (ref 70–99)
Potassium: 4.3 mmol/L (ref 3.5–5.1)
Sodium: 144 mmol/L (ref 135–145)

## 2022-01-04 LAB — GLUCOSE, CAPILLARY
Glucose-Capillary: 133 mg/dL — ABNORMAL HIGH (ref 70–99)
Glucose-Capillary: 219 mg/dL — ABNORMAL HIGH (ref 70–99)
Glucose-Capillary: 182 mg/dL — ABNORMAL HIGH (ref 70–99)
Glucose-Capillary: 242 mg/dL — ABNORMAL HIGH (ref 70–99)

## 2022-01-04 LAB — MAGNESIUM: Magnesium: 1.7 mg/dL (ref 1.7–2.4)

## 2022-01-04 NOTE — Hospital Course (Addendum)
Taken from H&P.  [name] is a 88 y.o. female with medical history significant of  dCHF, hypertension, diabetes mellitus, meningioma in the left frontal area, atrial fibrillation not on anticoagulants, CAD, CKD stage IIIa, PAD, who presents with shortness of breath and bilateral leg swelling.   Patient was recently hospitalized from 8/11 - 8/16 due to CHF exacerbation.  Patient had 2D echo on 12/21/2021 which showed EF of 60 to 65%.  Patient was discharged to nursing home, and currently receiving torsemide 40 mg daily there.  She states that her leg edema has been progressively worsening in the past several days.  Her shortness breath has also been progressively worsening.  She denies chest pain, fever or chills.  No nausea, vomiting, diarrhea or abdominal pain.  No symptoms of UTI.   Data reviewed independently and ED Course: pt was found to have BNP 779, troponin level 30, creatinine 1.08, BUN 51, GFR 49, temperature normal, blood pressure 140/79, heart rate 80, RR 20, oxygen saturation 90% on RA, which improved to 97% on 3 L oxygen.  Chest x-ray showed cardiomegaly and vascular congestion with layering right pleural effusion.   EKG: I have personally reviewed.  Atrial fibrillation, QTc 406, RAD, poor R wave progression, right bundle blockade, low voltage. Patient was started on IV Lasix.  8/26: No recorded urinary output.  Renal function has been normalized.  Patient is very fixated that she has sepsis and does not want to go back to her facility stating that they do not manage her medical conditions.  Refusing to work with PT and OT.   Keep talking about transfer to [facility] to her nephrologist, similar behavior on the prior admission.  8/27: Patient continued to have different excuses and not participating with her care.  Refusing to work with PT and OT and keep blaming everyone for different reasons. She was feeling more tired and labs with little worsening of metabolic alkalosis, venous blood  gases were done as an add-on as she does not want arterial puncture and they show hypercarbia at 77, up to 60 is normal on venous blood gas. Repeat chest x-ray with concern of slight worsening of bilateral pleural effusion and there was a concern of 1 pulmonary nodule-CT chest was recommended and ordered. Can try BiPAP for night if she allows. Recent echocardiogram with normal EF, indeterminate diastolic function, severely dilated right and left atrium. We will continue IV diuresis.  I had a long discussion with her son Jenny Reichmann regarding her behavior and refusing care, adding to him this is her personality and he will talk with his brother as he might be able to convince her.  8/28: Patient was unable to tolerate BiPAP last night which was ordered for concern of hypercarbia. CT chest results as follows 1. 16 mm x 8 mm left upper lobe noncalcified lung nodule. Consider one of the following in 3 months for both low-risk and high-risk individuals: (a) repeat chest CT, (b) follow-up PET-CT, or (c) tissue sampling.  2. Small to moderate size bilateral pleural effusions with bilateral lower lobe compressive atelectasis. 3. T10 and T12 chronic compression fracture deformities with large lucent areas seen within the anterior aspects of numerous thoracic spine vertebral bodies. Correlation with MRI is recommended. 4. 2.0 cm x 1.3 cm nodule versus cyst is seen within the right lobe of the thyroid gland. Further evaluation with nonemergent thyroid ultrasound is recommended.  CBG remained elevated-increasing the dose of somatically to 8 units twice daily and adding 3 units of  NovoLog with meals.  Patient continued to complain about that she is not getting any medical care but refusing a lot of things with nursing staff.  Patient wants to go back to her physicians at [facility]. She does not want any further care at [facility]. [facility] is quite hesitant to take her back and asking for a palliative care consult which was  ordered. Per patient she has an appointment with her doctors at [facility] on Wednesday and she cannot miss that. No record of intake and output while she is being diuresed. Another message sent to nursing staff.  8/29: Hemodynamically stable.  Slight increase in creatinine, IV Lasix was discontinued.  We will give her a break of diuretic today and she will be starting torsemide 40 mg daily from tomorrow.  Patient will continue with the rest of her home medications and need to have a close follow-up with her doctors at [facility] where she feel more comfortable for further management.

## 2022-01-04 NOTE — Progress Notes (Signed)
OT Cancellation Note  Patient Details Name: [name] MRN: [id number] DOB: 1931-01-29   Cancelled Treatment:    Reason Eval/Treat Not Completed: Patient declined, no reason specified. 2nd attempt with PT to initiate services. Upon arrival pt reporting having a BM in bed and awaiting assistance. Attempted to assist and pt adamantly refused stating "I do not want you to help me, it is not your job and the nurse assistant needs to come clean me up." Educated on OT scope of practice and repeatedly attempted to assist with hygiene and pt became agitated, waving arms and refusing. Will re-attempt one more time next date however if pt continues to refuse therapy will sign off.   Dessie Coma, M.S. OTR/L  01/04/22, 1:37 PM  ascom [phone]

## 2022-01-04 NOTE — Assessment & Plan Note (Signed)
Patient completed a course of antibiotics.  No acute concern.  Clinically appears healing well. No more erythema. -Continue with wound care

## 2022-01-04 NOTE — Progress Notes (Signed)
OT Cancellation Note  Patient Details Name: [name] MRN: [id number] DOB: [birth year]   Cancelled Treatment:    Reason Eval/Treat Not Completed: Patient declined, no reason specified. Order received and chart reviewed. Upon arrival PT at bedside, pt stating discomfort and hunger. Encouraged OOB to chair for meal and offered to reposition in bed - pt adamantly refused stating she cannot work with therapy until she has had breakfast. Will re-attempt as available to initiate services.   Dessie Coma, M.S. OTR/L  01/04/22, 9:01 AM  ascom [phone]

## 2022-01-04 NOTE — Progress Notes (Signed)
Progress Note   Patient: [name] ZOX:[id number] DOB: Oct 09, 1930 DOA: 01/03/2022     1 DOS: the patient was seen and examined on 01/04/2022   Brief hospital course: Taken from H&P.  [name] is a 88 y.o. female with medical history significant of  dCHF, hypertension, diabetes mellitus, meningioma in the left frontal area, atrial fibrillation not on anticoagulants, CAD, CKD stage IIIa, PAD, who presents with shortness of breath and bilateral leg swelling.   Patient was recently hospitalized from 8/11 - 8/16 due to CHF exacerbation.  Patient had 2D echo on 12/21/2021 which showed EF of 60 to 65%.  Patient was discharged to nursing home, and currently receiving torsemide 40 mg daily there.  She states that her leg edema has been progressively worsening in the past several days.  Her shortness breath has also been progressively worsening.  She denies chest pain, fever or chills.  No nausea, vomiting, diarrhea or abdominal pain.  No symptoms of UTI.   Data reviewed independently and ED Course: pt was found to have BNP 779, troponin level 30, creatinine 1.08, BUN 51, GFR 49, temperature normal, blood pressure 140/79, heart rate 80, RR 20, oxygen saturation 90% on RA, which improved to 97% on 3 L oxygen.  Chest x-ray showed cardiomegaly and vascular congestion with layering right pleural effusion.   EKG: I have personally reviewed.  Atrial fibrillation, QTc 406, RAD, poor R wave progression, right bundle blockade, low voltage. Patient was started on IV Lasix.  8/26: No recorded urinary output.  Renal function has been normalized.  Patient is very fixated that she has sepsis and does not want to go back to her facility stating that they do not manage her medical conditions.  Refusing to work with PT and OT.   Keep talking about transfer to [facility] to her nephrologist, similar behavior on the prior admission.   Assessment and Plan: * Acute on chronic diastolic CHF (congestive heart failure)  ([place]) Today: 12/21/2021 showed EF 60 to 65%.  Patient has 2+ leg edema, positive JVD, crackles on auscultation, elevated BNP 779, clinically consistent with CHF exacerbation.  Patient has 2 L new oxygen requirement, but no acute respiratory distress, does not meet criteria for acute respiratory failure.  Patient has cardiologist and nephrologist in [place], wants to be transferred to [facility].  ED physician contacted [facility], but they do not have a bed. No urinary output recorded, renal functions improving.  Currently better than her baseline. -Continue Lasix 40 mg bid by IV -Daily weights -strict I/O's -Low salt diet -Fluid restriction -Obtain REDs Vest reading  Myocardial injury -see above  Coronary artery disease History of CAD and myocardial injury due to CHF exacerbation: Troponin level 30 which is at recent baseline.  Patient had recent baseline troponin 30-34.  No chest pain. -Continue aspirin -Patient is allergic to statin  Essential (primary) hypertension -IV hydralazine as needed -metoprolo -pt is also on IV Lasix  Persistent atrial fibrillation (HCC) Not on anticoagulant.  Heart rate 80 -Continue metoprolol  Type II diabetes mellitus with renal manifestations (HCC) Recent A1c 7.3, poorly controlled.  Patient taking metformin and Levemir 5 units twice daily -Glargine insulin 3 unit twice daily -Sliding scale insulin  Chronic kidney disease, stage 3a (HCC) Stable.  Creatinine better than her baseline. -f/u with BMP  Cellulitis of left lower extremity Patient completed a course of antibiotics.  No acute concern.  Clinically appears healing well. No more erythema. -Continue with wound care   Subjective: Patient remained agitated  as usual.  Similar behavior on prior admission.  Refusing most of the care and keeps saying that she is septic and we are not taking care of her.  Continue to have a lot of complaints against staff and now for her rehab place stating that they do not  take care of her medical needs??  Physical Exam: Vitals:   01/03/22 2226 01/04/22 0203 01/04/22 0825 01/04/22 1147  BP: (!) 131/59 (!) 133/47 (!) 154/71   Pulse: 75 (!) 49 69 83  Resp: '17 18 16 18  '$ Temp: 98 F (36.7 C) 98.1 F (36.7 C) 98 F (36.7 C) 98 F (36.7 C)  TempSrc: Oral Oral Oral   SpO2: 96% 99% 100% 100%   General.     In no acute distress. Pulmonary.  Lungs clear bilaterally, normal respiratory effort. CV.  Regular rate and rhythm, no JVD, rub or murmur. Abdomen.  Soft, nontender, nondistended, BS positive. CNS.  Alert and oriented .  No focal neurologic deficit. Extremities.  Trace LE edema, no cyanosis, pulses intact and symmetrical.  Bilateral lower extremity wrapped with clean bandage. Psychiatry.  Appears to have some cognitive impairment  Data Reviewed: Prior data reviewed  Family Communication:   Disposition: Status is: Inpatient Remains inpatient appropriate because: Severity of illness   Planned Discharge Destination: Skilled nursing facility  Time spent: 45 minutes  This record has been created using Systems analyst. Errors have been sought and corrected,but may not always be located. Such creation errors do not reflect on the standard of care.  Author: Lorella Nimrod, MD 01/04/2022 4:11 PM  For on call review [email or web address].

## 2022-01-04 NOTE — Progress Notes (Signed)
PT Cancellation Note  Patient Details Name: [name] MRN: [id number] DOB: Jun 08, 1930   Cancelled Treatment:    Reason Eval/Treat Not Completed: Patient declined, no reason specified PT orders received and pt chart reviewed. Pt supine in bed upon entry. Currently declining participation with PT/OT evaluation due to having BM and needing assist getting cleaned up. PT/OT to offer assistance, but pt declined stating "that's not your job." Explained to pt that assisting with pericare is within scope of PT/OT practice and that bed mobility/movement is therapeutic, but pt continued to adamantly decline, stating she will only let RN/CNA assist with pericare as that is "their job." Pt then goes on to state that "the longer they wait the mushier and messier it gets"; again, offered assistance for pericare and pt declined. Charge nurse\ aware. Will continue attempts for PT evaluation tomorrow, as appropriate.    Herminio Commons, PT, DPT 1:33 PM,01/04/22 Physical Therapist - [facility]

## 2022-01-04 NOTE — Progress Notes (Signed)
PT Cancellation Note  Patient Details Name: [name] MRN: [id number] DOB: Oct 25, 1930   Cancelled Treatment:    Reason Eval/Treat Not Completed: Patient declined, no reason specified. PT orders received and pt chart reviewed. Pt supine in bed upon entry, agreeable for PT session. After subjective questioning and initiation of mobility assessment, pt declining participation until she eats breakfast. Encouraged pt for mobility to recliner, so she can eat sitting up. Pt visibly distressed and adamant about working with PT/OT after she's eaten. Will follow up with PT evaluation today with time permitting.    Herminio Commons, PT, DPT 9:34 AM,01/04/22 Physical Therapist - [facility]

## 2022-01-05 ENCOUNTER — Inpatient Hospital Stay: Payer: Medicare Other

## 2022-01-05 DIAGNOSIS — E874 Mixed disorder of acid-base balance: Secondary | ICD-10-CM

## 2022-01-05 DIAGNOSIS — I5033 Acute on chronic diastolic (congestive) heart failure: Secondary | ICD-10-CM | POA: Diagnosis not present

## 2022-01-05 LAB — BLOOD GAS, VENOUS
Acid-Base Excess: 16.4 mmol/L — ABNORMAL HIGH (ref 0.0–2.0)
Bicarbonate: 45.6 mmol/L — ABNORMAL HIGH (ref 20.0–28.0)
O2 Saturation: 62.5 %
Patient temperature: 37
pCO2, Ven: 77 mmHg (ref 44–60)
pH, Ven: 7.38 (ref 7.25–7.43)
pO2, Ven: 40 mmHg (ref 32–45)

## 2022-01-05 LAB — GLUCOSE, CAPILLARY
Glucose-Capillary: 140 mg/dL — ABNORMAL HIGH (ref 70–99)
Glucose-Capillary: 257 mg/dL — ABNORMAL HIGH (ref 70–99)
Glucose-Capillary: 212 mg/dL — ABNORMAL HIGH (ref 70–99)
Glucose-Capillary: 265 mg/dL — ABNORMAL HIGH (ref 70–99)

## 2022-01-05 LAB — BASIC METABOLIC PANEL
Anion gap: 6 (ref 5–15)
BUN: 43 mg/dL — ABNORMAL HIGH (ref 8–23)
CO2: 38 mmol/L — ABNORMAL HIGH (ref 22–32)
Calcium: 9.1 mg/dL (ref 8.9–10.3)
Chloride: 101 mmol/L (ref 98–111)
Creatinine, Ser: 0.99 mg/dL (ref 0.44–1.00)
GFR, Estimated: 55 mL/min — ABNORMAL LOW (ref >60)
Glucose, Bld: 141 mg/dL — ABNORMAL HIGH (ref 70–99)
Potassium: 4.3 mmol/L (ref 3.5–5.1)
Sodium: 145 mmol/L (ref 135–145)

## 2022-01-05 MED ORDER — INSULIN GLARGINE-YFGN 100 UNIT/ML ~~LOC~~ SOLN
5.0000 [IU] | Freq: Two times a day (BID) | SUBCUTANEOUS | Status: DC
  Administered 2022-01-05 – 2022-01-06 (×2): 5 [IU] via SUBCUTANEOUS
  Filled 2022-01-05 (×3): qty 0.05

## 2022-01-05 NOTE — Assessment & Plan Note (Signed)
Stable.  Creatinine better than her baseline. -f/u with BMP

## 2022-01-05 NOTE — Assessment & Plan Note (Signed)
Today: 12/21/2021 showed EF 60 to 65%.  Patient has 2+ leg edema, positive JVD, crackles on auscultation, elevated BNP 779, clinically consistent with CHF exacerbation.  Patient has 2 L new oxygen requirement, but no acute respiratory distress, does not meet criteria for acute respiratory failure.  Patient has cardiologist and nephrologist in [place], wants to be transferred to [facility].  ED physician contacted [facility], but they do not have a bed. Not much urinary output recorded as she is being incontinent.   Repeat chest x-ray with little worsening of bilateral pleural effusion.  Any concern of 1 small pulmonary nodule-recommending CT chest which was ordered. -Continue Lasix 40 mg bid by IV -Daily weights -strict I/O's -Low salt diet -Fluid restriction -Obtain REDs Vest reading

## 2022-01-05 NOTE — Assessment & Plan Note (Signed)
Recent A1c 7.3, poorly controlled.  Patient taking metformin and Levemir 5 units twice daily -Increase glargine insulin to 5 unit twice daily -Sliding scale insulin

## 2022-01-05 NOTE — Progress Notes (Signed)
Progress Note   Patient: [name] [id number]:[id number] DOB: September 08, 1930 DOA: 01/03/2022     2 DOS: the patient was seen and examined on 01/05/2022   Brief hospital course: Taken from H&P.  [name] is a 88 y.o. female with medical history significant of  dCHF, hypertension, diabetes mellitus, meningioma in the left frontal area, atrial fibrillation not on anticoagulants, CAD, CKD stage IIIa, PAD, who presents with shortness of breath and bilateral leg swelling.   Patient was recently hospitalized from 8/11 - 8/16 due to CHF exacerbation.  Patient had 2D echo on 12/21/2021 which showed EF of 60 to 65%.  Patient was discharged to nursing home, and currently receiving torsemide 40 mg daily there.  She states that her leg edema has been progressively worsening in the past several days.  Her shortness breath has also been progressively worsening.  She denies chest pain, fever or chills.  No nausea, vomiting, diarrhea or abdominal pain.  No symptoms of UTI.   Data reviewed independently and ED Course: pt was found to have BNP 779, troponin level 30, creatinine 1.08, BUN 51, GFR 49, temperature normal, blood pressure 140/79, heart rate 80, RR 20, oxygen saturation 90% on RA, which improved to 97% on 3 L oxygen.  Chest x-ray showed cardiomegaly and vascular congestion with layering right pleural effusion.   EKG: I have personally reviewed.  Atrial fibrillation, QTc 406, RAD, poor R wave progression, right bundle blockade, low voltage. Patient was started on IV Lasix.  8/26: No recorded urinary output.  Renal function has been normalized.  Patient is very fixated that she has sepsis and does not want to go back to her facility stating that they do not manage her medical conditions.  Refusing to work with PT and OT.   Keep talking about transfer to [facility] to her nephrologist, similar behavior on the prior admission.  8/27: Patient continued to have different excuses and not participating with her care.  Refusing  to work with PT and OT and keep blaming everyone for different reasons. She was feeling more tired and labs with little worsening of metabolic alkalosis, venous blood gases were done as an add-on as she does not want arterial puncture and they show hypercarbia at 77, up to 60 is normal on venous blood gas. Repeat chest x-ray with concern of slight worsening of bilateral pleural effusion and there was a concern of 1 pulmonary nodule-CT chest was recommended and ordered. Can try BiPAP for night if she allows. Recent echocardiogram with normal EF, indeterminate diastolic function, severely dilated right and left atrium. We will continue IV diuresis.  I had a long discussion with her son [name] regarding her behavior and refusing care, adding to him this is her personality and he will talk with his brother as he might be able to convince her.   Assessment and Plan: * Acute on chronic diastolic CHF (congestive heart failure) ([place]) Today: 12/21/2021 showed EF 60 to 65%.  Patient has 2+ leg edema, positive JVD, crackles on auscultation, elevated BNP 779, clinically consistent with CHF exacerbation.  Patient has 2 L new oxygen requirement, but no acute respiratory distress, does not meet criteria for acute respiratory failure.  Patient has cardiologist and nephrologist in [place], wants to be transferred to [facility].  ED physician contacted [facility], but they do not have a bed. Not much urinary output recorded as she is being incontinent.   Repeat chest x-ray with little worsening of bilateral pleural effusion.  Any concern of 1 small  pulmonary nodule-recommending CT chest which was ordered. -Continue Lasix 40 mg bid by IV -Daily weights -strict I/O's -Low salt diet -Fluid restriction -Obtain REDs Vest reading  Metabolic alkalosis with respiratory acidosis Patient seems to have little worsening of metabolic alkalosis so venous blood gas was ordered as add-on as she does not want an arterial puncture. It shows PCO2  of 77-mildly elevated. -We will try BiPAP if she allows. -Continue to monitor  Essential (primary) hypertension -IV hydralazine as needed -metoprolol -pt is also on IV Lasix  Coronary artery disease History of CAD and myocardial injury due to CHF exacerbation: Troponin level 30 which is at recent baseline.  Patient had recent baseline troponin 30-34.  No chest pain. -Continue aspirin -Patient is allergic to statin  Myocardial injury -see above  Persistent atrial fibrillation ([place]) Not on anticoagulant.  Heart rate 80 -Continue metoprolol  Cellulitis of left lower extremity Patient completed a course of antibiotics.  No acute concern.  Clinically appears healing well. No more erythema. -Continue with wound care  Type II diabetes mellitus with renal manifestations (HCC) Recent A1c 7.3, poorly controlled.  Patient taking metformin and Levemir 5 units twice daily -Increase glargine insulin to 5 unit twice daily -Sliding scale insulin  Chronic kidney disease, stage 3a (HCC) Stable.  Creatinine better than her baseline. -f/u with BMP   Subjective: Patient continued to complain about the care stating that no one is passionate about her.  She was very concerned that they are not taking care of her medical needs.  She was again asking to go to [facility]-despite explaining multiple times that is not a possibility.  She can go over there by herself or a family member can take her to their emergency room then she get upset.  Physical Exam: Vitals:   01/04/22 1941 01/05/22 0510 01/05/22 0738 01/05/22 1532  BP: (!) 155/59 (!) 154/51 126/62 (!) 135/58  Pulse: 67 (!) 58 78 62  Resp: '19 18 16 17  '$ Temp: 97.6 F (36.4 C)  98.3 F (36.8 C) 99.5 F (37.5 C)  TempSrc: Oral     SpO2: 97% 100% 90% 98%   General.  Little agitated elderly lady, in no acute distress. Pulmonary.  Lungs clear bilaterally, normal respiratory effort. CV.  Regular rate and rhythm, no JVD, rub or murmur. Abdomen.   Soft, nontender, nondistended, BS positive. CNS.  Alert and oriented .  No focal neurologic deficit. Extremities.  1+ LE edema, no cyanosis, pulses intact and symmetrical. Psychiatry.  Judgment and insight appears normal.  Data Reviewed: Prior data reviewed.  Family Communication: Discussed with son [name] on phone.  Disposition: Status is: Inpatient Remains inpatient appropriate because: Severity of illness.   Planned Discharge Destination: Skilled nursing facility  Time spent: 50 minutes  This record has been created using Systems analyst. Errors have been sought and corrected,but may not always be located. Such creation errors do not reflect on the standard of care.  Author: Lorella Nimrod, MD 01/05/2022 3:45 PM  For on call review [email or web address].

## 2022-01-05 NOTE — Assessment & Plan Note (Signed)
Patient seems to have little worsening of metabolic alkalosis so venous blood gas was ordered as add-on as she does not want an arterial puncture. It shows PCO2 of 77-mildly elevated. -We will try BiPAP if she allows. -Continue to monitor

## 2022-01-05 NOTE — Progress Notes (Signed)
Attempted bipap with patient. Pressures titrated down as low as machine would allow.  Patient not able to tolerate. Placed back on o2 via nasal cannula.

## 2022-01-05 NOTE — Progress Notes (Signed)
Patient refused the BIPAP. Stated "it makes me feel like I can't breath with all that forced air". MD made aware. Currently on 2.5 L via [place]. Will continue to encourage use of the BIPAP.

## 2022-01-05 NOTE — Assessment & Plan Note (Signed)
History of CAD and myocardial injury due to CHF exacerbation: Troponin level 30 which is at recent baseline.  Patient had recent baseline troponin 30-34.  No chest pain. -Continue aspirin -Patient is allergic to statin

## 2022-01-05 NOTE — Progress Notes (Signed)
OT Cancellation Note  Patient Details Name: [name] MRN: [id number] DOB: 1930/08/13   Cancelled Treatment:    Reason Eval/Treat Not Completed: Patient declined. Pt not interested in participating in OT services this AM, instead wanting to report in detail her dissatisfaction with the level of care she has received, would not engage in any other manner. Pt has now refused 3 OT evaluation attempts; will sign off.    Josiah Lobo 01/05/2022, 9:55 AM

## 2022-01-05 NOTE — Assessment & Plan Note (Signed)
-  IV hydralazine as needed -metoprolol -pt is also on IV Lasix

## 2022-01-05 NOTE — Progress Notes (Signed)
PT Cancellation Note  Patient Details Name: [name] MRN: [id number] DOB: 09/18/1930   Cancelled Treatment:    Reason Eval/Treat Not Completed: Patient declined, no reason specified. PT orders received and pt chart reviewed. Pt supine in bed, just finished breakfast. Agreeable for PT participation with encouragement. As PT is setting up the room for transfer to recliner, pt states that she needs to have a BM. PT agreeable and encouraged for assist to/from the [facility] for toileting. Pt stating she feels that she'll have an accident between the transfer. Educated pt that PT could provide brief for comfort in case of incontinence. Pt declining, stating that she "sat in her own feces 2hrs yesterday." Then goes onto to state that "therapy ripped the call bell out of her hand yesterday when she tried to call for help." This PT was there yesterday when OT, Devin, reached for call bell to ensure it worked as pt stated she called for help multiple times and no one came.   Educated pt that if this is her 3rd time declining, it's hospital policy that we sign off and will not come back. Pt becomes agitated, stating that this PT is threatening and abusing her. PT to explain that this rule is hospital policy. Charge RN notified of pt need to use bathroom. PT to sign off at this time; please re-consult when pt is agreeable for participation.     Herminio Commons, PT, DPT 11:20 AM,01/05/22 Physical Therapist - [facility]

## 2022-01-06 DIAGNOSIS — I5033 Acute on chronic diastolic (congestive) heart failure: Secondary | ICD-10-CM | POA: Diagnosis not present

## 2022-01-06 LAB — GLUCOSE, CAPILLARY
Glucose-Capillary: 146 mg/dL — ABNORMAL HIGH (ref 70–99)
Glucose-Capillary: 214 mg/dL — ABNORMAL HIGH (ref 70–99)
Glucose-Capillary: 269 mg/dL — ABNORMAL HIGH (ref 70–99)
Glucose-Capillary: 249 mg/dL — ABNORMAL HIGH (ref 70–99)

## 2022-01-06 MED ORDER — INSULIN ASPART 100 UNIT/ML IJ SOLN
3.0000 [IU] | Freq: Three times a day (TID) | INTRAMUSCULAR | Status: DC
  Administered 2022-01-06 – 2022-01-07 (×2): 3 [IU] via SUBCUTANEOUS
  Filled 2022-01-06 (×2): qty 1

## 2022-01-06 MED ORDER — INSULIN GLARGINE-YFGN 100 UNIT/ML ~~LOC~~ SOLN
8.0000 [IU] | Freq: Two times a day (BID) | SUBCUTANEOUS | Status: DC
  Administered 2022-01-06 – 2022-01-07 (×2): 8 [IU] via SUBCUTANEOUS
  Filled 2022-01-06 (×3): qty 0.08

## 2022-01-06 NOTE — TOC Progression Note (Signed)
Transition of Care [facility]) - Progression Note    Patient Details  Name: [name] MRN: [id number] Date of Birth: 1930/10/29  Transition of Care [facility]) CM/SW Contact  Laurena Slimmer, RN Phone Number: 01/06/2022, 3:56 PM  Clinical Narrative:    Spoke with patient at bedside to advise of discharge plan back to [facility]. Patient questioning why she is returning to the facility. Reiterated to patient previous refusals for care and with therapy here at [facility]. Patient stated she does not feel the [facility] is capable of her care. Advised this CM had spoken with the facility and they too stated she had not worked with therapy in approximately two weeks and refused care.Patient stated she has anxiety due to fear of being dropped. Patient stated she had not been properly care for at this facility or at [facility]. Patient 's preference is to be cared for by her nephrologist at [facility].          Expected Discharge Plan and Services                                                 Social Determinants of Health (SDOH) Interventions    Readmission Risk Interventions     No data to display

## 2022-01-06 NOTE — Progress Notes (Signed)
Progress Note   Patient: [name] QIO:[id number] DOB: 1931/01/24 DOA: 01/03/2022     3 DOS: the patient was seen and examined on 01/06/2022   Brief hospital course: Taken from H&P.  [name] is a 88 y.o. female with medical history significant of  dCHF, hypertension, diabetes mellitus, meningioma in the left frontal area, atrial fibrillation not on anticoagulants, CAD, CKD stage IIIa, PAD, who presents with shortness of breath and bilateral leg swelling.   Patient was recently hospitalized from 8/11 - 8/16 due to CHF exacerbation.  Patient had 2D echo on 12/21/2021 which showed EF of 60 to 65%.  Patient was discharged to nursing home, and currently receiving torsemide 40 mg daily there.  She states that her leg edema has been progressively worsening in the past several days.  Her shortness breath has also been progressively worsening.  She denies chest pain, fever or chills.  No nausea, vomiting, diarrhea or abdominal pain.  No symptoms of UTI.   Data reviewed independently and ED Course: pt was found to have BNP 779, troponin level 30, creatinine 1.08, BUN 51, GFR 49, temperature normal, blood pressure 140/79, heart rate 80, RR 20, oxygen saturation 90% on RA, which improved to 97% on 3 L oxygen.  Chest x-ray showed cardiomegaly and vascular congestion with layering right pleural effusion.   EKG: I have personally reviewed.  Atrial fibrillation, QTc 406, RAD, poor R wave progression, right bundle blockade, low voltage. Patient was started on IV Lasix.  8/26: No recorded urinary output.  Renal function has been normalized.  Patient is very fixated that she has sepsis and does not want to go back to her facility stating that they do not manage her medical conditions.  Refusing to work with PT and OT.   Keep talking about transfer to [facility] to her nephrologist, similar behavior on the prior admission.  8/27: Patient continued to have different excuses and not participating with her care.  Refusing  to work with PT and OT and keep blaming everyone for different reasons. She was feeling more tired and labs with little worsening of metabolic alkalosis, venous blood gases were done as an add-on as she does not want arterial puncture and they show hypercarbia at 77, up to 60 is normal on venous blood gas. Repeat chest x-ray with concern of slight worsening of bilateral pleural effusion and there was a concern of 1 pulmonary nodule-CT chest was recommended and ordered. Can try BiPAP for night if she allows. Recent echocardiogram with normal EF, indeterminate diastolic function, severely dilated right and left atrium. We will continue IV diuresis.  I had a long discussion with her son [name] regarding her behavior and refusing care, adding to him this is her personality and he will talk with his brother as he might be able to convince her.  8/28: Patient was unable to tolerate BiPAP last night which was ordered for concern of hypercarbia. CT chest results as follows 1. 16 mm x 8 mm left upper lobe noncalcified lung nodule. Consider one of the following in 3 months for both low-risk and high-risk individuals: (a) repeat chest CT, (b) follow-up PET-CT, or (c) tissue sampling.  2. Small to moderate size bilateral pleural effusions with bilateral lower lobe compressive atelectasis. 3. T10 and T12 chronic compression fracture deformities with large lucent areas seen within the anterior aspects of numerous thoracic spine vertebral bodies. Correlation with MRI is recommended. 4. 2.0 cm x 1.3 cm nodule versus cyst is seen within the right  lobe of the thyroid gland. Further evaluation with nonemergent thyroid ultrasound is recommended.  CBG remained elevated-increasing the dose of somatically to 8 units twice daily and adding 3 units of NovoLog with meals.  Patient continued to complain about that she is not getting any medical care but refusing a lot of things with nursing staff.  Patient wants to go back to  her physicians at [facility]. She does not want any further care at [facility] [place] is quite hesitant to take her back and asking for a palliative care consult which was ordered. Per patient she has an appointment with her doctors at [facility] on Wednesday and she cannot miss that.    Assessment and Plan: * Acute on chronic diastolic CHF (congestive heart failure) ([place]) Echo on 12/21/2021 showed EF 60 to 65%.  Patient has 2+ leg edema, positive JVD, crackles on auscultation, elevated BNP 779, clinically consistent with CHF exacerbation.  Patient has 2 L new oxygen requirement, but no acute respiratory distress, does not meet criteria for acute respiratory failure.  Patient has cardiologist and nephrologist in [place], wants to be transferred to [facility].  ED physician contacted [facility], but they do not have a bed. Not much urinary output recorded as she is being incontinent.   Repeat chest x-ray with little worsening of bilateral pleural effusion.  Any concern of 1 small pulmonary nodule-recommending CT chest  CT chest with confirmation of lung nodule need 24-monthfollow-up. There was also concerning thyroid nodule which need further outpatient follow-up. Bilateral mild-to-moderate pleural effusions. [facility]  She need to go over there herself, appeared he had an appointment this Wednesday which she should keep. -Continue Lasix 40 mg bid by IV -Daily weights -strict I/O's -Low salt diet -Fluid restriction -Obtain REDs Vest reading  Metabolic alkalosis with respiratory acidosis Patient seems to have little worsening of metabolic alkalosis so venous blood gas was ordered as add-on as she does not want an arterial puncture. It shows PCO2 of 77-mildly elevated. No change in mental condition. Unable to tolerate BiPAP even at night -Continue to monitor  Essential (primary) hypertension -IV hydralazine as needed -metoprolol -pt is also on IV Lasix  Coronary  artery disease History of CAD and myocardial injury due to CHF exacerbation: Troponin level 30 which is at recent baseline.  Patient had recent baseline troponin 30-34.  No chest pain. -Continue aspirin -Patient is allergic to statin  Myocardial injury -see above  Persistent atrial fibrillation ([place] Not on anticoagulant.  Heart rate 80 -Continue metoprolol  Cellulitis of left lower extremity Patient completed a course of antibiotics.  No acute concern.  Clinically appears healing well. No more erythema. -Continue with wound care  Type II diabetes mellitus with renal manifestations (HCC) Recent A1c 7.3, poorly controlled.  Patient taking metformin and Levemir 5 units twice daily. CBG remained elevated -Increase glargine insulin to 8 unit twice daily -Sliding scale insulin -Add 3 units of NovoLog with meals  Chronic kidney disease, stage 3a (HCC) Stable.  Creatinine better than her baseline. -f/u with BMP   Subjective: Patient was seen and examined today.  She was continued to complain about nursing staff that they are not taking care of her.  [facility] and explaining multiple times that we will not be able to transfer her and she has to go by herself.  Per patient she had an appointment on Wednesday  and just needs some paperwork from [place], not sure what she was talking about.  Physical Exam: Vitals:   01/05/22 1532 01/05/22 1948 01/06/22 0417 01/06/22 0837  BP: (!) 135/58 (!) 140/60 (!) 129/53 (!) 140/73  Pulse: 62 80 (!) 59 63  Resp: '17 18 20 18  '$ Temp: 99.5 F (37.5 C) 98 F (36.7 C) 97.8 F (36.6 C) 98.4 F (36.9 C)  TempSrc:  Oral Oral Oral  SpO2: 98% 100% 100% 100%   General.  Little agitated elderly lady, in no acute distress. Pulmonary.  Lungs clear bilaterally, normal respiratory effort. CV.  Regular rate and rhythm, no JVD, rub or murmur. Abdomen.  Soft, nontender, nondistended, BS positive. CNS.   Alert and oriented .  No focal neurologic deficit. Extremities.  1+ LE edema, no cyanosis, pulses intact and symmetrical.  Clean bandages on lower extremities. Psychiatry.  Appears to have some cognitive impairment.  Data Reviewed: Prior data reviewed.  Family Communication: Called son and left a Advertising account executive.  Disposition: Status is: Inpatient Remains inpatient appropriate because: Severity of illness.  Should be able to go back to her facility tomorrow so she can go for her appointment on Wednesday.   Planned Discharge Destination: Skilled nursing facility  Time spent: 45 minutes  This record has been created using Systems analyst. Errors have been sought and corrected,but may not always be located. Such creation errors do not reflect on the standard of care.  Author: Lorella Nimrod, MD 01/06/2022 2:23 PM  For on call review [email or web address].

## 2022-01-06 NOTE — Assessment & Plan Note (Signed)
Patient seems to have little worsening of metabolic alkalosis so venous blood gas was ordered as add-on as she does not want an arterial puncture. It shows PCO2 of 77-mildly elevated. No change in mental condition. Unable to tolerate BiPAP even at night -Continue to monitor

## 2022-01-06 NOTE — Assessment & Plan Note (Addendum)
Echo on 12/21/2021 showed EF 60 to 65%.  Patient has 2+ leg edema, positive JVD, crackles on auscultation, elevated BNP 779, clinically consistent with CHF exacerbation.  Patient has 2 L new oxygen requirement, but no acute respiratory distress, does not meet criteria for acute respiratory failure.  Patient has cardiologist and nephrologist in [place], wants to be transferred to [facility].  ED physician contacted [facility], but they do not have a bed. Not much urinary output recorded as she is being incontinent.   Repeat chest x-ray with little worsening of bilateral pleural effusion.  Any concern of 1 small pulmonary nodule-recommending CT chest  CT chest with confirmation of lung nodule need 16-monthfollow-up. There was also concerning thyroid nodule which need further outpatient follow-up. Bilateral mild-to-moderate pleural effusions. [facility]  She need to go over there herself, appeared he had an appointment this Wednesday which she should keep. -Continue Lasix 40 mg bid by IV -Daily weights -strict I/O's-still no record.  Another message sent to nursing staff -Low salt diet -Fluid restriction -Obtain REDs Vest reading

## 2022-01-06 NOTE — Assessment & Plan Note (Signed)
Recent A1c 7.3, poorly controlled.  Patient taking metformin and Levemir 5 units twice daily. CBG remained elevated -Increase glargine insulin to 8 unit twice daily -Sliding scale insulin -Add 3 units of NovoLog with meals

## 2022-01-06 NOTE — TOC Progression Note (Signed)
Transition of Care [facility]) - Progression Note    Patient Details  Name: [name] MRN: [id number] Date of Birth: 07-10-1930  Transition of [facility]) CM/SW Contact  Laurena Slimmer, RN Phone Number: 01/06/2022, 2:07 PM  Clinical Narrative:    [name] with [name] in admissions at [facility]. Patient refuses care at facility. Palliative consult requested by facility. MD notified.   Attempt to speak with patient. Patient eating her food and request to talk with her later.         Expected Discharge Plan and Services                                                 Social Determinants of Health (SDOH) Interventions    Readmission Risk Interventions     No data to display

## 2022-01-06 NOTE — Discharge Instructions (Signed)

## 2022-01-07 ENCOUNTER — Encounter: Payer: Self-pay | Admitting: Internal Medicine

## 2022-01-07 DIAGNOSIS — Z7189 Other specified counseling: Secondary | ICD-10-CM

## 2022-01-07 DIAGNOSIS — I5033 Acute on chronic diastolic (congestive) heart failure: Secondary | ICD-10-CM | POA: Diagnosis not present

## 2022-01-07 DIAGNOSIS — Z515 Encounter for palliative care: Secondary | ICD-10-CM | POA: Diagnosis not present

## 2022-01-07 LAB — GLUCOSE, CAPILLARY: Glucose-Capillary: 121 mg/dL — ABNORMAL HIGH (ref 70–99)

## 2022-01-07 LAB — BASIC METABOLIC PANEL
Anion gap: 6 (ref 5–15)
BUN: 46 mg/dL — ABNORMAL HIGH (ref 8–23)
CO2: 38 mmol/L — ABNORMAL HIGH (ref 22–32)
Calcium: 8.9 mg/dL (ref 8.9–10.3)
Chloride: 100 mmol/L (ref 98–111)
Creatinine, Ser: 1.21 mg/dL — ABNORMAL HIGH (ref 0.44–1.00)
GFR, Estimated: 43 mL/min — ABNORMAL LOW (ref >60)
Glucose, Bld: 114 mg/dL — ABNORMAL HIGH (ref 70–99)
Potassium: 4.4 mmol/L (ref 3.5–5.1)
Sodium: 144 mmol/L (ref 135–145)

## 2022-01-07 MED ORDER — INSULIN DETEMIR 100 UNIT/ML ~~LOC~~ SOLN: 5.0000 [IU] | Freq: Two times a day (BID) | SUBCUTANEOUS | 11 refills | Status: DC

## 2022-01-07 MED ORDER — TORSEMIDE 20 MG PO TABS: 40.0000 mg | ORAL_TABLET | Freq: Every day | ORAL | Status: DC

## 2022-01-07 MED ORDER — DOCUSATE SODIUM 100 MG PO CAPS: 100.0000 mg | ORAL_CAPSULE | Freq: Every day | ORAL | Status: DC

## 2022-01-07 MED ORDER — DM-GUAIFENESIN ER 30-600 MG PO TB12: 1.0000 | ORAL_TABLET | Freq: Two times a day (BID) | ORAL | 0 refills | Status: DC | PRN

## 2022-01-07 MED ORDER — ASPIRIN 81 MG PO TBEC: 81.0000 mg | DELAYED_RELEASE_TABLET | Freq: Every day | ORAL | 12 refills | Status: DC

## 2022-01-07 MED ORDER — ACETAMINOPHEN 325 MG PO TABS: 650.0000 mg | ORAL_TABLET | Freq: Four times a day (QID) | ORAL | Status: DC | PRN

## 2022-01-07 MED ORDER — DOCUSATE SODIUM 100 MG PO CAPS: 100.0000 mg | ORAL_CAPSULE | Freq: Every day | ORAL | 0 refills | Status: DC

## 2022-01-07 NOTE — NC FL2 (Signed)
[place] LEVEL OF CARE SCREENING TOOL     IDENTIFICATION  Patient Name: [name] Birthdate: May 06, 1931 Sex: female Admission Date (Current Location): 01/03/2022  [place] and [place] Number:  Engineering geologist and Address:  [facility], [address] [place], [place] [address]      Provider Number: [id number]  Attending Physician Name and Address:  Lorella Nimrod, MD  Relative Name and Phone Number:  Teresa Pelton    Current Level of Care: Hospital Recommended Level of Care: Other (Comment) [facility]) Prior Approval Number:    Date Approved/Denied:   PASRR Number: [id number]  Discharge Plan: Other (Comment)    Current Diagnoses: Patient Active Problem List   Diagnosis Date Noted   Metabolic alkalosis with respiratory acidosis 01/05/2022   Chronic kidney disease, stage 3a ([place]) 01/03/2022   Candidiasis, vulva 01/02/2022   Acute on chronic diastolic CHF (congestive heart failure) ([place]) 12/20/2021   Coronary artery disease    Type II diabetes mellitus with renal manifestations (HCC)    Myocardial injury    Elevated lactic acid level    Cellulitis of left lower extremity    CKD stage G3a/A1, GFR 45-59 and albumin creatinine ratio <30 mg/g (HCC) 05/10/2019   Atherosclerosis of native arteries of the extremities with ulceration ([place]) 05/10/2019   Failure of outpatient treatment    Cellulitis of foot, right    Hyperkalemia 04/01/2019   Chronic diastolic CHF (congestive heart failure) ([place]) 03/30/2019   PVD (peripheral vascular disease) ([place]) 03/30/2019   Cellulitis of both lower extremities 03/29/2019   Postmenopausal osteoporosis 05/18/2017   Carpal tunnel syndrome on right 08/27/2016   Persistent atrial fibrillation ([place]) 07/14/2016   Backache 06/27/2016   Aortic heart murmur 09/10/2015   Macroalbuminuric diabetic nephropathy ([place]) 06/25/2015   Essential (primary) hypertension 12/06/2014   Cerebral  meningioma ([place]) 47/01/6282   Complication of diabetes mellitus ([place]) 05/19/2014   History of neoplasm of bladder 05/19/2014   Personal history of healed traumatic fracture 05/19/2014   Diabetes mellitus type 2, controlled, with complications ([place]) 66/29/4765    Orientation RESPIRATION BLADDER Height & Weight     Self, Time, Situation, Place  Normal External catheter Weight:   Height:     BEHAVIORAL SYMPTOMS/MOOD NEUROLOGICAL BOWEL NUTRITION STATUS   (n/a)  (n/a) Continent Diet (2 gram sodium)  AMBULATORY STATUS COMMUNICATION OF NEEDS Skin   Limited Assist Verbally Normal (Erythema back)                       Personal Care Assistance Level of Assistance  Bathing, Dressing Bathing Assistance: Maximum assistance Feeding assistance: Limited assistance Dressing Assistance: Maximum assistance     Functional Limitations Info  Hearing   Hearing Info: Impaired      SPECIAL CARE FACTORS FREQUENCY                       Contractures Contractures Info: Not present    Additional Factors Info  Code Status, Allergies Code Status Info: DNR Allergies Info: Alendronate, Iodinated Contrast Media, Statins           Current Medications (01/07/2022):   Medication List       TAKE these medications     acetaminophen 325 MG tablet Commonly known as: TYLENOL Take 2 tablets (650 mg total) by mouth every 6 (six) hours as needed for mild pain or fever.    aspirin EC 81 MG tablet Take 1 tablet (81 mg  total) by mouth daily. Swallow whole.    dextromethorphan-guaiFENesin 30-600 MG 12hr tablet Commonly known as: MUCINEX DM Take 1 tablet by mouth 2 (two) times daily as needed for cough.    insulin detemir 100 UNIT/ML injection Commonly known as: Levemir Inject 0.05 mLs (5 Units total) into the skin 2 (two) times daily. What changed: when to take this    LOSARTAN POTASSIUM PO Take 100 mg by mouth at bedtime.    metFORMIN 1000 MG tablet Commonly known as:  GLUCOPHAGE 1,000 mg 2 (two) times daily with a meal.    metoprolol tartrate 25 MG tablet Commonly known as: LOPRESSOR Take 1 tablet by mouth 2 (two) times daily.    saccharomyces boulardii 250 MG capsule Commonly known as: FLORASTOR Take 250 mg by mouth daily.    torsemide 20 MG tablet Commonly known as: DEMADEX Take 40 mg by mouth daily.      Discharge Medications: Please see discharge summary for a list of discharge medications.  Relevant Imaging Results:  Relevant Lab Results:   Additional Information SS# [id number]  Laurena Slimmer, RN

## 2022-01-07 NOTE — Progress Notes (Signed)
Pts. Latest blood pressure: 105/48. Held evening metoprolol per Neomia Glass, NP.

## 2022-01-07 NOTE — Discharge Summary (Addendum)
Physician Discharge Summary   Patient: [name] MRN: [id number] DOB: [birth year]  Admit date:     01/03/2022  Discharge date: 01/07/22  Discharge Physician: Lorella Nimrod   PCP: Pcp, No   Recommendations at discharge:  Please obtain BMP within a week. Patient need to go to [facility] for her medical needs instead of coming to [place] as she does not want to be treated here.  She trusted her [facility] providers and should be treated over there.  We cannot provide the transfer. Follow-up with her [facility] providers for further recommendations. Continue using 2 L of oxygen Continue wound care with daily dressing  Discharge Diagnoses: Principal Problem:   Acute on chronic diastolic CHF (congestive heart failure) (HCC) Active Problems:   Metabolic alkalosis with respiratory acidosis   Essential (primary) hypertension   Coronary artery disease   Myocardial injury   Persistent atrial fibrillation (HCC)   Cellulitis of left lower extremity   Type II diabetes mellitus with renal manifestations (HCC)   Chronic kidney disease, stage 3a [facility])   Hospital Course: Taken from H&P.  [name] is a 88 y.o. female with medical history significant of  dCHF, hypertension, diabetes mellitus, meningioma in the left frontal area, atrial fibrillation not on anticoagulants, CAD, CKD stage IIIa, PAD, who presents with shortness of breath and bilateral leg swelling.   Patient was recently hospitalized from 8/11 - 8/16 due to CHF exacerbation.  Patient had 2D echo on 12/21/2021 which showed EF of 60 to 65%.  Patient was discharged to nursing home, and currently receiving torsemide 40 mg daily there.  She states that her leg edema has been progressively worsening in the past several days.  Her shortness breath has also been progressively worsening.  She denies chest pain, fever or chills.  No nausea, vomiting, diarrhea or abdominal pain.  No symptoms of UTI.   Data reviewed independently and ED Course: pt was found to  have BNP 779, troponin level 30, creatinine 1.08, BUN 51, GFR 49, temperature normal, blood pressure 140/79, heart rate 80, RR 20, oxygen saturation 90% on RA, which improved to 97% on 3 L oxygen.  Chest x-ray showed cardiomegaly and vascular congestion with layering right pleural effusion.   EKG: I have personally reviewed.  Atrial fibrillation, QTc 406, RAD, poor R wave progression, right bundle blockade, low voltage. Patient was started on IV Lasix.  8/26: No recorded urinary output.  Renal function has been normalized.  Patient is very fixated that she has sepsis and does not want to go back to her facility stating that they do not manage her medical conditions.  Refusing to work with PT and OT.   Keep talking about transfer to [facility] to her nephrologist, similar behavior on the prior admission.  8/27: Patient continued to have different excuses and not participating with her care.  Refusing to work with PT and OT and keep blaming everyone for different reasons. She was feeling more tired and labs with little worsening of metabolic alkalosis, venous blood gases were done as an add-on as she does not want arterial puncture and they show hypercarbia at 77, up to 60 is normal on venous blood gas. Repeat chest x-ray with concern of slight worsening of bilateral pleural effusion and there was a concern of 1 pulmonary nodule-CT chest was recommended and ordered. Can try BiPAP for night if she allows. Recent echocardiogram with normal EF, indeterminate diastolic function, severely dilated right and left atrium. We will continue IV diuresis.  I had  a long discussion with her son Jenny Reichmann regarding her behavior and refusing care, adding to him this is her personality and he will talk with his brother as he might be able to convince her.  8/28: Patient was unable to tolerate BiPAP last night which was ordered for concern of hypercarbia. CT chest results as follows 1. 16 mm x 8 mm left upper lobe noncalcified  lung nodule. Consider one of the following in 3 months for both low-risk and high-risk individuals: (a) repeat chest CT, (b) follow-up PET-CT, or (c) tissue sampling.  2. Small to moderate size bilateral pleural effusions with bilateral lower lobe compressive atelectasis. 3. T10 and T12 chronic compression fracture deformities with large lucent areas seen within the anterior aspects of numerous thoracic spine vertebral bodies. Correlation with MRI is recommended. 4. 2.0 cm x 1.3 cm nodule versus cyst is seen within the right lobe of the thyroid gland. Further evaluation with nonemergent thyroid ultrasound is recommended.  CBG remained elevated-increasing the dose of somatically to 8 units twice daily and adding 3 units of NovoLog with meals.  Patient continued to complain about that she is not getting any medical care but refusing a lot of things with nursing staff.  Patient wants to go back to her physicians at [facility]. She does not want any further care at [facility]. [place] is quite hesitant to take her back and asking for a palliative care consult which was ordered. Per patient she has an appointment with her doctors at [facility] on Wednesday and she cannot miss that. No record of intake and output while she is being diuresed. Another message sent to nursing staff.  8/29: Hemodynamically stable.  Slight increase in creatinine, IV Lasix was discontinued.  We will give her a break of diuretic today and she will be starting torsemide 40 mg daily from tomorrow.  Patient will continue with the rest of her home medications and need to have a close follow-up with her doctors at [facility] where she feel more comfortable for further management.  Assessment and Plan: * Acute on chronic diastolic CHF (congestive heart failure) ([place]) Echo on 12/21/2021 showed EF 60 to 65%.  Patient has 2+ leg edema, positive JVD, crackles on auscultation, elevated BNP 779, clinically consistent with CHF exacerbation.  Patient has 2 L  new oxygen requirement, but no acute respiratory distress, does not meet criteria for acute respiratory failure.  Patient has cardiologist and nephrologist in [place], wants to be transferred to [facility]  ED physician contacted [facility], but they do not have a bed. Not much urinary output recorded as she is being incontinent.   Repeat chest x-ray with little worsening of bilateral pleural effusion.  Any concern of 1 small pulmonary nodule-recommending CT chest  CT chest with confirmation of lung nodule need 49-monthfollow-up. There was also concerning thyroid nodule which need further outpatient follow-up. Bilateral mild-to-moderate pleural effusions. [facility]  She need to go over there herself, appeared he had an appointment this Wednesday which she should keep. -Continue Lasix 40 mg bid by IV -Daily weights -strict I/O's-still no record.  Another message sent to nursing staff -Low salt diet -Fluid restriction -Obtain REDs Vest reading  Metabolic alkalosis with respiratory acidosis Patient seems to have little worsening of metabolic alkalosis so venous blood gas was ordered as add-on as she does not want an arterial puncture. It shows PCO2 of 77-mildly elevated. No change in mental condition. Unable to tolerate BiPAP even at  night -Continue to monitor  Essential (primary) hypertension -IV hydralazine as needed -metoprolol -pt is also on IV Lasix  Coronary artery disease History of CAD and myocardial injury due to CHF exacerbation: Troponin level 30 which is at recent baseline.  Patient had recent baseline troponin 30-34.  No chest pain. -Continue aspirin -Patient is allergic to statin  Myocardial injury -see above  Persistent atrial fibrillation ([place]) Not on anticoagulant.  Heart rate 80 -Continue metoprolol  Cellulitis of left lower extremity Patient completed a course of antibiotics.  No acute concern.  Clinically appears healing  well. No more erythema. -Continue with wound care  Type II diabetes mellitus with renal manifestations (HCC) Recent A1c 7.3, poorly controlled.  Patient taking metformin and Levemir 5 units twice daily. CBG remained elevated -Increase glargine insulin to 8 unit twice daily -Sliding scale insulin -Add 3 units of NovoLog with meals  Chronic kidney disease, stage 3a (HCC) Stable.  Creatinine better than her baseline. -f/u with BMP   Consultants: None Procedures performed: None Disposition: Skilled nursing facility Diet recommendation:  Discharge Diet Orders (From admission, onward)     Start     Ordered   01/07/22 0000  Diet - low sodium heart healthy        01/07/22 1023           Cardiac and Carb modified diet DISCHARGE MEDICATION: Allergies as of 01/07/2022       Reactions   Alendronate Other (See Comments)   Cramping of the jaw   Iodinated Contrast Media Other (See Comments)   Brother has allergy and she wants not to receive it    Statins Other (See Comments)   Fatigue/muscle weakness Fatigue/muscle weakness Fatigue/muscle weakness        Medication List     TAKE these medications    acetaminophen 325 MG tablet Commonly known as: TYLENOL Take 2 tablets (650 mg total) by mouth every 6 (six) hours as needed for mild pain or fever.   aspirin EC 81 MG tablet Take 1 tablet (81 mg total) by mouth daily. Swallow whole.   dextromethorphan-guaiFENesin 30-600 MG 12hr tablet Commonly known as: MUCINEX DM Take 1 tablet by mouth 2 (two) times daily as needed for cough.   docusate sodium 100 MG capsule Commonly known as: COLACE Take 1 capsule (100 mg total) by mouth daily.   insulin detemir 100 UNIT/ML injection Commonly known as: Levemir Inject 0.05 mLs (5 Units total) into the skin 2 (two) times daily. What changed: when to take this   LOSARTAN POTASSIUM PO Take 100 mg by mouth at bedtime.   metFORMIN 1000 MG tablet Commonly known as: GLUCOPHAGE 1,000  mg 2 (two) times daily with a meal.   metoprolol tartrate 25 MG tablet Commonly known as: LOPRESSOR Take 1 tablet by mouth 2 (two) times daily.   saccharomyces boulardii 250 MG capsule Commonly known as: FLORASTOR Take 250 mg by mouth daily.   torsemide 20 MG tablet Commonly known as: DEMADEX Take 40 mg by mouth daily.               Discharge Care Instructions  (From admission, onward)           Start     Ordered   01/07/22 0000  Discharge wound care:       Comments: Continue with daily dressing change as she was getting before.   01/07/22 1023            Contact information for after-discharge care  Destination     [facility] .   Service: Skilled Nursing Contact information: [address] [place] [phone]                    Discharge Exam: There were no vitals filed for this visit. General.     In no acute distress. Pulmonary.  Lungs clear bilaterally, normal respiratory effort. CV.  Regular rate and rhythm, no JVD, rub or murmur. Abdomen.  Soft, nontender, nondistended, BS positive. CNS.  Alert and oriented .  No focal neurologic deficit. Extremities.  Trace LE edema, no cyanosis, pulses intact and symmetrical.  No erythema.  Wound looks much improved. Psychiatry.  Judgment and insight appears normal.   Condition at discharge: stable  The results of significant diagnostics from this hospitalization (including imaging, microbiology, ancillary and laboratory) are listed below for reference.   Imaging Studies: CT CHEST WO CONTRAST  Result Date: 01/05/2022 CLINICAL DATA:  Follow-up abnormal lung nodule on x-ray. EXAM: CT CHEST WITHOUT CONTRAST TECHNIQUE: Multidetector CT imaging of the chest was performed following the standard protocol without IV contrast. RADIATION DOSE REDUCTION: This exam was performed according to the departmental dose-optimization program which includes automated  exposure control, adjustment of the mA and/or kV according to patient size and/or use of iterative reconstruction technique. COMPARISON:  None Available. FINDINGS: Cardiovascular: Marked severity calcification of the thoracic aorta is seen, without evidence of aortic aneurysm. There is mild cardiomegaly with marked severity coronary artery calcification. No pericardial effusion. Mediastinum/Nodes: Numerous subcentimeter AP window and pretracheal lymph nodes are seen. A 2.0 cm x 1.3 cm nodule versus cyst is seen within the right lobe of the thyroid gland. Occluded terminal bronchioles are suspected within the bilateral lower lobes. The trachea and esophagus demonstrate no significant findings. Lungs/Pleura: A 16 mm x 8 mm lobulated noncalcified lung nodule is seen within the posterolateral aspect of the left upper lobe. Mild left lower lobe and moderate severity right lower lobe compressive atelectasis is seen. Mild posteromedial right upper lobe atelectatic changes are also noted. Small to moderate size bilateral pleural effusions are seen. No pneumothorax is identified. Upper Abdomen: No acute abnormality. Musculoskeletal: Chronic compression fracture deformities are seen involving the T10 and T12 vertebral bodies. Extensive areas of lucency are seen within the anterior aspects of numerous thoracic spine vertebral bodies. Multilevel degenerative changes are noted throughout the thoracic spine IMPRESSION: 1. 16 mm x 8 mm left upper lobe noncalcified lung nodule. Consider one of the following in 3 months for both low-risk and high-risk individuals: (a) repeat chest CT, (b) follow-up PET-CT, or (c) tissue sampling. This recommendation follows the consensus statement: Guidelines for Management of Incidental Pulmonary Nodules Detected on CT Images: From the Fleischner Society 2017; Radiology 2017; 284:228-243. 2. Small to moderate size bilateral pleural effusions with bilateral lower lobe compressive atelectasis. 3. T10  and T12 chronic compression fracture deformities with large lucent areas seen within the anterior aspects of numerous thoracic spine vertebral bodies. Correlation with MRI is recommended. 4. 2.0 cm x 1.3 cm nodule versus cyst is seen within the right lobe of the thyroid gland. Further evaluation with nonemergent thyroid ultrasound is recommended. This follows ACR consensus guidelines: Managing Incidental Thyroid Nodules Detected on Imaging: White Paper of the ACR Incidental Thyroid Findings Committee. J Am Coll Radiol 2015; 12:143-150. Aortic Atherosclerosis (ICD10-I70.0). Electronically Signed   By: Virgina Norfolk M.D.   On: 01/05/2022 23:07   DG Chest 2 View  Result Date:  01/05/2022 CLINICAL DATA:  CHF.  Hypertension.  Diabetes. EXAM: CHEST - 2 VIEW COMPARISON:  January 03, 2022 FINDINGS: Stable cardiomegaly. The hila and mediastinum are unchanged. No pneumothorax. Bilateral pleural effusions with underlying opacities, increased. Nodular opacity in the lateral left mid lung does not definitely correlate with a specific rib, measuring 13 mm. No other acute abnormalities. IMPRESSION: 1. Increasing bilateral pleural effusions with underlying opacities, likely atelectasis. No overt edema. 2. There is a 13 mm nodule over the left lateral chest which does not definitely correlate with a specific rib and could represent a pulmonary nodule. Recommend a CT scan for further evaluation. 3. No other changes. Electronically Signed   By: Dorise Bullion III M.D.   On: 01/05/2022 08:38   DG Chest Portable 1 View  Result Date: 01/03/2022 CLINICAL DATA:  Provided history: CHF, leg swelling. EXAM: PORTABLE CHEST 1 VIEW COMPARISON:  Prior chest radiographs 12/20/2021 and earlier. FINDINGS: Patient rotation to the right. Cardiomegaly. Aortic atherosclerosis. Central pulmonary vascular congestion and interstitial edema. Hazy opacity at the right lung base compatible with a layering pleural effusion. Trace left pleural  effusion. Bibasilar atelectasis. No evidence of pneumothorax. Degenerative changes of the spine. IMPRESSION: Cardiomegaly with central pulmonary vascular congestion and interstitial edema. Layering right pleural effusion. Trace left pleural effusion. Bibasilar atelectasis. Aortic Atherosclerosis (ICD10-I70.0). Electronically Signed   By: Kellie Simmering D.O.   On: 01/03/2022 13:22   ECHOCARDIOGRAM COMPLETE  Result Date: 12/21/2021    ECHOCARDIOGRAM REPORT   Patient Name:   Zenya Mooers Date of Exam: 12/21/2021 Medical Rec #:  [id number]   Height:       64.5 in Accession #:    [id number]  Weight:       199.3 lb Date of Birth:  1930-07-10   BSA:          1.964 m Patient Age:    88 years    BP:           155/66 mmHg Patient Gender: F           HR:           64 bpm. Exam Location:  [facility] Procedure: 2D Echo, Color Doppler and Cardiac Doppler Indications:     I50.31 congestive heart failure-Acute Diastolic  History:         Patient has prior history of Echocardiogram examinations, most                  recent 07/21/2018. CAD, Arrythmias:Atrial Fibrillation; Risk                  Factors:Hypertension and Diabetes.  Sonographer:     Charmayne Sheer Referring Phys:  [place] Diagnosing Phys: Skeet Latch MD  Sonographer Comments: Suboptimal parasternal window. Image acquisition challenging due to patient body habitus. IMPRESSIONS  1. Left ventricular ejection fraction, by estimation, is 60 to 65%. The left ventricle has normal function. The left ventricle has no regional wall motion abnormalities. Left ventricular diastolic parameters are indeterminate.  2. Right ventricular systolic function is mildly reduced. The right ventricular size is normal. There is normal pulmonary artery systolic pressure.  3. Left atrial size was severely dilated.  4. Right atrial size was severely dilated.  5. The mitral valve is normal in structure. No evidence of mitral valve regurgitation. No evidence of mitral stenosis. Moderate mitral  annular calcification.  6. The aortic valve is tricuspid. There is mild calcification of the aortic valve. There is mild thickening of the  aortic valve. Aortic valve regurgitation is not visualized. No aortic stenosis is present.  7. The inferior vena cava is dilated in size with <50% respiratory variability, suggesting right atrial pressure of 15 mmHg. FINDINGS  Left Ventricle: Left ventricular ejection fraction, by estimation, is 60 to 65%. The left ventricle has normal function. The left ventricle has no regional wall motion abnormalities. The left ventricular internal cavity size was normal in size. There is  no left ventricular hypertrophy. Left ventricular diastolic function could not be evaluated due to atrial fibrillation. Left ventricular diastolic parameters are indeterminate. Right Ventricle: The right ventricular size is normal. No increase in right ventricular wall thickness. Right ventricular systolic function is mildly reduced. There is normal pulmonary artery systolic pressure. The tricuspid regurgitant velocity is 2.24 m/s, and with an assumed right atrial pressure of 15 mmHg, the estimated right ventricular systolic pressure is 08.1 mmHg. Left Atrium: Left atrial size was severely dilated. Right Atrium: Right atrial size was severely dilated. Pericardium: There is no evidence of pericardial effusion. Mitral Valve: The mitral valve is normal in structure. Moderate mitral annular calcification. No evidence of mitral valve regurgitation. No evidence of mitral valve stenosis. Tricuspid Valve: The tricuspid valve is normal in structure. Tricuspid valve regurgitation is trivial. No evidence of tricuspid stenosis. Aortic Valve: The aortic valve is tricuspid. There is mild calcification of the aortic valve. There is mild thickening of the aortic valve. Aortic valve regurgitation is not visualized. No aortic stenosis is present. Aortic valve mean gradient measures 8.0 mmHg. Aortic valve peak gradient  measures 14.9 mmHg. Aortic valve area, by VTI measures 0.79 cm. Pulmonic Valve: The pulmonic valve was normal in structure. Pulmonic valve regurgitation is trivial. No evidence of pulmonic stenosis. Aorta: The aortic root is normal in size and structure. Venous: The inferior vena cava is dilated in size with less than 50% respiratory variability, suggesting right atrial pressure of 15 mmHg. IAS/Shunts: No atrial level shunt detected by color flow Doppler.  LEFT VENTRICLE PLAX 2D LVOT diam:     1.60 cm     Diastology LV SV:         31          LV e' medial:    5.66 cm/s LV SV Index:   16          LV E/e' medial:  19.6 LVOT Area:     2.01 cm    LV e' lateral:   6.64 cm/s                            LV E/e' lateral: 16.7  LV Volumes (MOD) LV vol d, MOD A2C: 66.7 ml LV vol d, MOD A4C: 75.9 ml LV vol s, MOD A2C: 25.3 ml LV vol s, MOD A4C: 31.2 ml LV SV MOD A2C:     41.4 ml LV SV MOD A4C:     75.9 ml LV SV MOD BP:      43.6 ml RIGHT VENTRICLE RV Basal diam:  4.58 cm LEFT ATRIUM             Index        RIGHT ATRIUM           Index LA Vol (A2C):   78.9 ml 40.18 ml/m  RA Area:     28.70 cm LA Vol (A4C):   70.1 ml 35.69 ml/m  RA Volume:   110.00 ml 56.01 ml/m LA Biplane Vol: 76.8  ml 39.11 ml/m  AORTIC VALVE                     PULMONIC VALVE AV Area (Vmax):    0.67 cm      PV Vmax:       0.83 m/s AV Area (Vmean):   0.70 cm      PV Peak grad:  2.8 mmHg AV Area (VTI):     0.79 cm AV Vmax:           193.00 cm/s AV Vmean:          136.000 cm/s AV VTI:            0.388 m AV Peak Grad:      14.9 mmHg AV Mean Grad:      8.0 mmHg LVOT Vmax:         64.00 cm/s LVOT Vmean:        47.600 cm/s LVOT VTI:          0.153 m LVOT/AV VTI ratio: 0.39  AORTA Ao Root diam: 3.00 cm MITRAL VALVE                TRICUSPID VALVE MV Area (PHT): 3.42 cm     TR Peak grad:   20.1 mmHg MV Decel Time: 222 msec     TR Vmax:        224.00 cm/s MV E velocity: 111.00 cm/s MV A velocity: 34.30 cm/s   SHUNTS MV E/A ratio:  3.24         Systemic VTI:   0.15 m                             Systemic Diam: 1.60 cm Skeet Latch MD Electronically signed by Skeet Latch MD Signature Date/Time: 12/21/2021/4:37:18 PM    Final    US Venous Img Lower Bilateral (DVT)  Result Date: 12/20/2021 CLINICAL DATA:  Pain and edema EXAM: BILATERAL LOWER EXTREMITY VENOUS DOPPLER ULTRASOUND TECHNIQUE: Gray-scale sonography with compression, as well as color and duplex ultrasound, were performed to evaluate the deep venous system(s) from the level of the common femoral vein through the popliteal and proximal calf veins. COMPARISON:  None Available. FINDINGS: VENOUS Normal compressibility of the common femoral, superficial femoral, and popliteal veins, as well as the visualized calf veins. Visualized portions of profunda femoral vein and great saphenous vein unremarkable. No filling defects to suggest DVT on grayscale or color Doppler imaging. Doppler waveforms show normal direction of venous flow, normal respiratory plasticity and response to augmentation. Limited views of the contralateral common femoral vein are unremarkable. OTHER None. Limitations: none IMPRESSION: Negative. Electronically Signed   By: Dorise Bullion III M.D.   On: 12/20/2021 17:52   DG Chest 2 View  Result Date: 12/20/2021 CLINICAL DATA:  Shortness of breath.  Bilateral leg swelling. EXAM: CHEST - 2 VIEW COMPARISON:  None Available. FINDINGS: Cardiomegaly. The hila and mediastinum are unremarkable. No pneumothorax. Compression fracture of a lower thoracic vertebral body with associated kyphosis. Small bilateral pleural effusions. Nodularity projected over the lateral left chest favored to represent healed rib fractures. No other acute abnormalities. IMPRESSION: 1. Cardiomegaly. 2. Compression fracture of a lower thoracic vertebral body with associated kyphosis, age indeterminate. 3. Small bilateral pleural effusions. 4. Nodularity projected over the lateral left chest favored to represent healed rib  fractures. Electronically Signed   By: Dorise Bullion III M.D.   On: 12/20/2021 15:38  Microbiology: Results for orders placed or performed during the hospital encounter of 12/20/21  SARS Coronavirus 2 by RT PCR (hospital order, performed in [facility] hospital lab) *cepheid single result test* Anterior Nasal Swab     Status: None   Collection Time: 12/20/21  3:43 PM   Specimen: Anterior Nasal Swab  Result Value Ref Range Status   SARS Coronavirus 2 by RT PCR NEGATIVE NEGATIVE Final    Comment: (NOTE) SARS-CoV-2 target nucleic acids are NOT DETECTED.  The SARS-CoV-2 RNA is generally detectable in upper and lower respiratory specimens during the acute phase of infection. The lowest concentration of SARS-CoV-2 viral copies this assay can detect is 250 copies / mL. A negative result does not preclude SARS-CoV-2 infection and should not be used as the sole basis for treatment or other patient management decisions.  A negative result may occur with improper specimen collection / handling, submission of specimen other than nasopharyngeal swab, presence of viral mutation(s) within the areas targeted by this assay, and inadequate number of viral copies (<250 copies / mL). A negative result must be combined with clinical observations, patient history, and epidemiological information.  Fact Sheet for Patients:   https://www.patel.info/  Fact Sheet for Healthcare Providers: https://hall.com/  This test is not yet approved or  cleared by the [place] FDA and has been authorized for detection and/or diagnosis of SARS-CoV-2 by FDA under an Emergency Use Authorization (EUA).  This EUA will remain in effect (meaning this test can be used) for the duration of the COVID-19 declaration under Section 564(b)(1) of the Act, 21 U.S.C. section 360bbb-3(b)(1), unless the authorization is terminated or revoked sooner.  Performed at [facility],  [address] [place], [place] [address]   Blood culture (routine x 2)     Status: None   Collection Time: 12/20/21  4:22 PM   Specimen: BLOOD  Result Value Ref Range Status   Specimen Description BLOOD LEFT ANTECUBITAL  Final   Special Requests   Final    BOTTLES DRAWN AEROBIC AND ANAEROBIC Blood Culture adequate volume   Culture   Final    NO GROWTH 5 DAYS Performed at [facility], [address] [place], [place] [address]    Report Status 12/25/2021 FINAL  Final  Blood culture (routine x 2)     Status: None   Collection Time: 12/20/21  4:27 PM   Specimen: BLOOD  Result Value Ref Range Status   Specimen Description BLOOD RIGHT ANTECUBITAL  Final   Special Requests   Final    BOTTLES DRAWN AEROBIC AND ANAEROBIC Blood Culture adequate volume   Culture   Final    NO GROWTH 5 DAYS Performed at [facility], [address], [place], [place] [address]    Report Status 12/25/2021 FINAL  Final  Culture, blood (Routine X 2) w Reflex to ID Panel     Status: None   Collection Time: 12/24/21 12:58 PM   Specimen: BLOOD  Result Value Ref Range Status   Specimen Description BLOOD RIGHT [facility]  Final   Special Requests   Final    BOTTLES DRAWN AEROBIC AND ANAEROBIC Blood Culture adequate volume   Culture   Final    NO GROWTH 5 DAYS Performed at [facility], [address] [place],  [address]    Report Status 12/29/2021 FINAL  Final  Culture, blood (Routine X 2) w Reflex to ID Panel     Status: None   Collection Time: 12/24/21  1:54 PM   Specimen: BLOOD  Result Value Ref Range Status   Specimen Description BLOOD RIGHT ANTECUBITAL  Final   Special Requests   Final    BOTTLES DRAWN AEROBIC AND ANAEROBIC Blood Culture adequate volume   Culture   Final    NO GROWTH 5 DAYS Performed at [facility], [address] [place], [place] [address]    Report Status 12/29/2021 FINAL  Final    Labs: CBC: Recent Labs  Lab 01/03/22 1249   WBC 8.6  NEUTROABS 7.0  HGB 10.9*  HCT 36.7  MCV 97.1  PLT 734*   Basic Metabolic Panel: Recent Labs  Lab 01/03/22 1249 01/04/22 0522 01/05/22 0538 01/07/22 0540  NA 145 144 145 144  K 4.8 4.3 4.3 4.4  CL 100 100 101 100  CO2 33* 35* 38* 38*  GLUCOSE 171* 138* 141* 114*  BUN 51* 48* 43* 46*  CREATININE 1.08* 0.91 0.99 1.21*  CALCIUM 9.5 9.3 9.1 8.9  MG  --  1.7  --   --    Liver Function Tests: Recent Labs  Lab 01/03/22 1249  AST 19  ALT 14  ALKPHOS 69  BILITOT 0.8  PROT 6.2*  ALBUMIN 2.6*   CBG: Recent Labs  Lab 01/06/22 0739 01/06/22 1156 01/06/22 1731 01/06/22 2054 01/07/22 0737  GLUCAP 146* 214* 269* 249* 121*    Discharge time spent: greater than 30 minutes.  This record has been created using Systems analyst. Errors have been sought and corrected,but may not always be located. Such creation errors do not reflect on the standard of care.   Signed: Lorella Nimrod, MD [facility] 01/07/2022

## 2022-01-07 NOTE — Consult Note (Signed)
Consultation Note Date: 01/07/2022   Patient Name: [name]  DOB: Sep 24, 1930  MRN: [id number]  Age / Sex: 88 y.o., female  PCP: Pcp, No Referring Physician: Lorella Nimrod, MD  Reason for Consultation: Establishing goals of care  HPI/Patient Profile: 88 y.o. female  with past medical history of diastolic heart failure, HTN, diabetes, meningioma in the left frontal area, A-fib no anticoagulant, CAD, CKD 3, PAD admitted on 01/03/2022 with acute on chronic diastolic heart failure.   Clinical Assessment and Goals of Care: I have reviewed medical records including EPIC notes, labs and imaging, received report from RN, assessed the patient.  [name] is sitting up in bed with her breakfast tray in front of her.  She greets me, making and somewhat keeping eye contact.  She appears chronically ill, morbidly obese.  She is alert and oriented, able to make her basic needs known.  There is no family at bedside at this time.  Limited interview today due to [name]'s interrupting and demanding statements.  We meet at bedside to discuss diagnosis prognosis, GOC, EOL wishes, disposition and options.  I introduced Palliative Medicine as specialized medical care for people living with serious illness. It focuses on providing relief from the symptoms and stress of a serious illness. The goal is to improve quality of life for both the patient and the family.  I attempted to focus on her current illness, heart failure.  [name] will frequently redirect conversations to meaningless details such as moving her head up or down.  Focused on their current illness. The natural disease trajectory and expectations at EOL were discussed.  Advanced directives, concepts specific to code status, artifical feeding and hydration, and rehospitalization were discussed today as she is known DNR.  Palliative Care services outpatient were  explained and offered.  Due to the progressive nature of heart failure, [name] needs outpatient palliative to follow for continued goals of care discussions.  Discussed the importance of continued conversation with family and the medical providers regarding overall plan of care and treatment options, ensuring decisions are within the context of the patient's values and GOCs.  Questions and concerns were addressed. The patient was encouraged to call with questions or concerns.  PMT will continue to support holistically.      HCPOA  NEXT OF KIN -sons [name] and [name]    SUMMARY OF RECOMMENDATIONS   Return to [facility] Outpatient palliative services to follow Anticipate multiple rehospitalizations Would benefit from psychiatric consult   Code Status/Advance Care Planning: DNR  Symptom Management:  Per hospitalist, no additional needs at this time.  Palliative Prophylaxis:  Oral Care and Turn Reposition  Additional Recommendations (Limitations, Scope, Preferences): Treat the treatable but no CPR or intubation  Psycho-social/Spiritual:  Desire for further Chaplaincy support:no Additional Recommendations: Caregiving  Support/Resources and would benefit from psychiatric evaluation  Prognosis:  Unable to determine, based on outcomes.  6 months or less would not be surprising based on chronic illness burden, decreasing functional status  Discharge Planning: Skilled  Nursing Facility for rehab with Palliative care service follow-up      Primary Diagnoses: Present on Admission:  Acute on chronic diastolic CHF (congestive heart failure) ([place])  Coronary artery disease  Myocardial injury  Essential (primary) hypertension  Persistent atrial fibrillation (HCC)  Type II diabetes mellitus with renal manifestations (HCC)  Chronic kidney disease, stage 3a ([place])  Cellulitis of left lower extremity   I have reviewed the medical record, interviewed the patient and  family, and examined the patient. The following aspects are pertinent.  Past Medical History:  Diagnosis Date   Atrial fibrillation ([place])    Coronary artery disease    Diabetes mellitus without complication ([place])    Hypertension    Meningioma (HCC)    Left frontal lobe   Social History   Socioeconomic History   Marital status: Married    Spouse name: Not on file   Number of children: Not on file   Years of education: Not on file   Highest education level: Not on file  Occupational History   Not on file  Tobacco Use   Smoking status: Never   Smokeless tobacco: Never  Vaping Use   Vaping Use: Never used  Substance and Sexual Activity   Alcohol use: Yes    Alcohol/week: 1.0 standard drink of alcohol    Types: 1 Glasses of wine per week   Drug use: No   Sexual activity: Not on file  Other Topics Concern   Not on file  Social History Narrative   Not on file   Social Determinants of Health   Financial Resource Strain: Not on file  Food Insecurity: Not on file  Transportation Needs: Not on file  Physical Activity: Not on file  Stress: Not on file  Social Connections: Not on file   Family History  Problem Relation Age of Onset   Stroke Brother    Lung cancer Brother    Scheduled Meds:  aspirin EC  81 mg Oral Daily   enoxaparin (LOVENOX) injection  0.5 mg/kg Subcutaneous Q24H   insulin aspart  0-5 Units Subcutaneous QHS   insulin aspart  0-9 Units Subcutaneous TID WC   insulin aspart  3 Units Subcutaneous TID WC   insulin glargine-yfgn  8 Units Subcutaneous BID   losartan  100 mg Oral Daily   metoprolol tartrate  25 mg Oral BID   saccharomyces boulardii  250 mg Oral Daily   [START ON 01/08/2022] torsemide  40 mg Oral Daily   Continuous Infusions: PRN Meds:.acetaminophen, albuterol, dextromethorphan-guaiFENesin, hydrALAZINE, ondansetron (ZOFRAN) IV Medications Prior to Admission:  Prior to Admission medications   Medication Sig Start Date End Date Taking?  Authorizing Provider  LOSARTAN POTASSIUM PO Take 100 mg by mouth at bedtime.   Yes [provider]  metFORMIN (GLUCOPHAGE) 1000 MG tablet 1,000 mg 2 (two) times daily with a meal.  04/13/15  Yes [provider]  metoprolol tartrate (LOPRESSOR) 25 MG tablet Take 1 tablet by mouth 2 (two) times daily. 05/22/21  Yes [provider]  saccharomyces boulardii (FLORASTOR) 250 MG capsule Take 250 mg by mouth daily.   Yes [provider]  torsemide (DEMADEX) 20 MG tablet Take 40 mg by mouth daily. 05/22/21 05/22/22 Yes [provider]  acetaminophen (TYLENOL) 325 MG tablet Take 2 tablets (650 mg total) by mouth every 6 (six) hours as needed for mild pain or fever. 01/07/22   [name] Nimrod, MD  aspirin EC 81 MG tablet Take 1 tablet (81 mg total)  by mouth daily. Swallow whole. 01/07/22   [name] Nimrod, MD  dextromethorphan-guaiFENesin [facility] DM) 30-600 MG 12hr tablet Take 1 tablet by mouth 2 (two) times daily as needed for cough. 01/07/22   [name] Nimrod, MD  insulin detemir (LEVEMIR) 100 UNIT/ML injection Inject 0.05 mLs (5 Units total) into the skin 2 (two) times daily. 01/07/22   [name] Nimrod, MD   Allergies  Allergen Reactions   Alendronate Other (See Comments)    Cramping of the jaw   Iodinated Contrast Media Other (See Comments)    Brother has allergy and she wants not to receive it    Statins Other (See Comments)    Fatigue/muscle weakness Fatigue/muscle weakness Fatigue/muscle weakness   Review of Systems  Unable to perform ROS: Age    Physical Exam Vitals and nursing note reviewed.  Constitutional:      General: She is not in acute distress.    Appearance: She is obese. She is ill-appearing.  HENT:     Mouth/Throat:     Mouth: Mucous membranes are moist.  Pulmonary:     Effort: Pulmonary effort is normal. No respiratory distress.  Musculoskeletal:        General: Swelling present.  Skin:    General: Skin is warm and dry.  Neurological:      Mental Status: She is alert and oriented to person, place, and time.  Psychiatric:     Comments: Pressured, manic     Vital Signs: BP 128/64 (BP Location: Left Arm)   Pulse 63   Temp 98.3 F (36.8 C)   Resp 16   SpO2 100%  Pain Scale: 0-10   Pain Score: 0-No pain   SpO2: SpO2: 100 % O2 Device:SpO2: 100 % O2 Flow Rate: .O2 Flow Rate (L/min): 5 L/min  IO: Intake/output summary:  Intake/Output Summary (Last 24 hours) at 01/07/2022 1200 Last data filed at 01/06/2022 1703 Gross per 24 hour  Intake 400 ml  Output 900 ml  Net -500 ml    LBM: Last BM Date : 01/04/22 Baseline Weight:   Most recent weight:       Palliative Assessment/Data:   Flowsheet Rows    Flowsheet Row Most Recent Value  Intake Tab   Referral Department Hospitalist  Unit at Time of Referral Med/Surg Unit  Palliative Care Primary Diagnosis Cardiac  Date Notified 01/06/22  Palliative Care Type New Palliative care  Reason for referral Clarify Goals of Care  Date of Admission 01/03/22  Date first seen by Palliative Care 01/07/22  # of days Palliative referral response time 1 Day(s)  # of days IP prior to Palliative referral 3  Clinical Assessment   Palliative Performance Scale Score 30%  Pain Max last 24 hours Not able to report  Pain Min Last 24 hours Not able to report  Dyspnea Max Last 24 Hours Not able to report  Dyspnea Min Last 24 hours Not able to report  Psychosocial & Spiritual Assessment   Palliative Care Outcomes        Time In: 0800 Time Out: 0855 Time Total: 55 minutes  Greater than 50%  of this time was spent counseling and coordinating care related to the above assessment and plan.  Signed by: Drue Novel, NP   Please contact Palliative Medicine Team phone at [phone] for questions and concerns.  For individual provider: See Shea Evans

## 2022-01-08 ENCOUNTER — Encounter: Payer: Self-pay | Admitting: Student

## 2022-01-08 ENCOUNTER — Non-Acute Institutional Stay (SKILLED_NURSING_FACILITY): Payer: Medicare Other | Admitting: Student

## 2022-01-08 DIAGNOSIS — L03115 Cellulitis of right lower limb: Secondary | ICD-10-CM

## 2022-01-08 DIAGNOSIS — I739 Peripheral vascular disease, unspecified: Secondary | ICD-10-CM | POA: Diagnosis not present

## 2022-01-08 DIAGNOSIS — E1121 Type 2 diabetes mellitus with diabetic nephropathy: Secondary | ICD-10-CM

## 2022-01-08 DIAGNOSIS — I5033 Acute on chronic diastolic (congestive) heart failure: Secondary | ICD-10-CM | POA: Diagnosis not present

## 2022-01-08 DIAGNOSIS — I10 Essential (primary) hypertension: Secondary | ICD-10-CM

## 2022-01-08 DIAGNOSIS — N1831 Chronic kidney disease, stage 3a: Secondary | ICD-10-CM | POA: Diagnosis not present

## 2022-01-08 DIAGNOSIS — I4819 Other persistent atrial fibrillation: Secondary | ICD-10-CM

## 2022-01-08 DIAGNOSIS — Z794 Long term (current) use of insulin: Secondary | ICD-10-CM

## 2022-01-08 DIAGNOSIS — K5901 Slow transit constipation: Secondary | ICD-10-CM

## 2022-01-08 DIAGNOSIS — L03116 Cellulitis of left lower limb: Secondary | ICD-10-CM

## 2022-01-08 NOTE — Progress Notes (Signed)
Provider:   Location:   [facility] Room Number: 662 HUTML of Service:  Nursing (32)  PCP: Pcp, No Patient Care Team: Pcp, No as PCP - General  Extended Emergency Contact Information Primary Emergency Contact: [place] Mobile Phone: [phone] Relation: Son Secondary Emergency Contact: [place] Mobile Phone: [phone] Relation: Son  Code Status: DNAR Goals of Care: Advanced Directive information    01/04/2022    2:00 AM  Advanced Directives  Does Patient Have a Medical Advance Directive? Yes  Type of Advance Directive Living will;Healthcare Power of [place];Out of facility DNR (pink MOST or yellow form)  Does patient want to make changes to medical advance directive? No - Patient declined  Copy of [place] in Chart? No - copy requested    No chief complaint on file.   HPI: Patient is a 88 y.o. female seen today for admission to [facility] for Acute on chronic dCHF. She had increase in work of breathing, swelling, and oxygen requirement. During hospitalizatin she received IV lasix BID and had improvement of symptoms. She was seen by palliative who attempted discussion regarding her goals of care and they will continue to follow outpatient. Kidney function remained stable while receiving diuresis.   Breathing has improved since being in the hospital. Her voice is different and she has to work harder to speak than before. Urinating okay, but not sure if I&Os have been tracked. She has 3 days worth of constipation. She would like something for it. The only pain is from when she has drainage from her legs and has pain where there is swelling. She feels that the legs are ultrasensitive, but not as severe as before going to the hospital. It's not constant. Denies headaches.   Called Son, Jenny Reichmann and DIL to discuss her current condition. Here is an outline of the phone conversation:  Ms. Liscano would like to go home as soon as possible. I  voiced concern that she needed therapy to do so and it's not safe right now. Her DIL and son are concerned about this as it is "against medical advice." I told them she has a right to since she has capacity; but she should have a fall device of some sort before this could happen. She said she would get a nurse for "a few hours of the day." For a transition like this, my recommendation would be 24-hour care.  Hubbard Robinson, her second son would like to talk with her to try and convince her to participate in therapy. I told them about the fact that therapy was going to sign off before her getting admitted to the hospital over the weekend and this current admission has no therapy orders. I told her if they can convince her someone should call Gaspar Bidding to try to get that underway.  They asked about availability at ALF if home isn't a safe option. She has a few upcoming appointments at [facility] for endocrinology. They are wondering about feasibility for these appointments given her status. To my knowledge [facility] helps with this kind of transport, but it seems like it would be an issue to get her there without a nurse. Does transport to an appointment usually have a nurse involved too? At this time, she is still okay with hospitalization, however, I am putting in a palliative consult in for her so they can continue to see her as they did in the hospital.    Past Medical History:  Diagnosis Date   Atrial fibrillation ([place])  Coronary artery disease    Diabetes mellitus without complication ([place])    Hypertension    Meningioma (HCC)    Left frontal lobe   Past Surgical History:  Procedure Laterality Date   APPENDECTOMY     CHOLECYSTECTOMY     MELANOMA REMOVAL Right     reports that she has never smoked. She has never used smokeless tobacco. She reports current alcohol use of about 1.0 standard drink of alcohol per week. She reports that she does not use drugs. Social History   Socioeconomic History    Marital status: Married    Spouse name: Not on file   Number of children: Not on file   Years of education: Not on file   Highest education level: Not on file  Occupational History   Not on file  Tobacco Use   Smoking status: Never   Smokeless tobacco: Never  Vaping Use   Vaping Use: Never used  Substance and Sexual Activity   Alcohol use: Yes    Alcohol/week: 1.0 standard drink of alcohol    Types: 1 Glasses of wine per week   Drug use: No   Sexual activity: Not on file  Other Topics Concern   Not on file  Social History Narrative   Not on file   Social Determinants of Health   Financial Resource Strain: Not on file  Food Insecurity: Not on file  Transportation Needs: Not on file  Physical Activity: Not on file  Stress: Not on file  Social Connections: Not on file  Intimate Partner Violence: Not on file    Functional Status Survey:    Family History  Problem Relation Age of Onset   Stroke Brother    Lung cancer Brother     Health Maintenance  Topic Date Due   FOOT EXAM  Never done   OPHTHALMOLOGY EXAM  Never done   TETANUS/TDAP  Never done   Pneumonia Vaccine 35+ Years old (2 - PCV) 05/20/2015   Zoster Vaccines- Shingrix (2 of 2) 03/06/2018   COVID-19 Vaccine (2 - Moderna series) 06/20/2020   INFLUENZA VACCINE  12/10/2021   HEMOGLOBIN A1C  06/22/2022   DEXA SCAN  Completed   HPV VACCINES  Aged Out    Allergies  Allergen Reactions   Alendronate Other (See Comments)    Cramping of the jaw   Iodinated Contrast Media Other (See Comments)    Brother has allergy and she wants not to receive it    Statins Other (See Comments)    Fatigue/muscle weakness Fatigue/muscle weakness Fatigue/muscle weakness    Outpatient Encounter Medications as of 01/08/2022  Medication Sig   acetaminophen (TYLENOL) 325 MG tablet Take 2 tablets (650 mg total) by mouth every 6 (six) hours as needed for mild pain or fever.   aspirin EC 81 MG tablet Take 1 tablet (81 mg total)  by mouth daily. Swallow whole.   dextromethorphan-guaiFENesin (MUCINEX DM) 30-600 MG 12hr tablet Take 1 tablet by mouth 2 (two) times daily as needed for cough.   docusate sodium (COLACE) 100 MG capsule Take 1 capsule (100 mg total) by mouth daily.   insulin detemir (LEVEMIR) 100 UNIT/ML injection Inject 0.05 mLs (5 Units total) into the skin 2 (two) times daily.   LOSARTAN POTASSIUM PO Take 100 mg by mouth at bedtime.   metFORMIN (GLUCOPHAGE) 1000 MG tablet 1,000 mg 2 (two) times daily with a meal.    metoprolol tartrate (LOPRESSOR) 25 MG tablet Take 1 tablet by mouth 2 (two)  times daily.   saccharomyces boulardii (FLORASTOR) 250 MG capsule Take 250 mg by mouth daily.   torsemide (DEMADEX) 20 MG tablet Take 40 mg by mouth daily.   No facility-administered encounter medications on file as of 01/08/2022.    Review of Systems  Constitutional:  Negative for fatigue and fever.  Respiratory:         Work of breathing improving, but still increased from July.   Gastrointestinal:  Positive for constipation.  All other systems reviewed and are negative.   Vitals:   01/08/22 2029  BP: (!) 150/70  Pulse: 74  Resp: 20  Temp: 97.7 F (36.5 C)  SpO2: 97%  Weight: 197 lb 3.2 oz (89.4 kg)   Body mass index is 33.33 kg/m. Physical Exam Constitutional:      Appearance: She is obese.  HENT:     Nose:     Comments: Nasal Cannula in place [facility] Cardiovascular:     Rate and Rhythm: Normal rate and regular rhythm.  Pulmonary:     Effort: Pulmonary effort is normal.     Breath sounds: Normal breath sounds.  Abdominal:     General: Bowel sounds are normal. There is no distension.     Palpations: Abdomen is soft.  Musculoskeletal:     Comments: 3+ pitting edema to the shin bilaterally  Skin:    General: Skin is warm and dry.  Neurological:     Mental Status: She is alert.  Psychiatric:        Mood and Affect: Mood normal.    Labs reviewed: Basic Metabolic Panel: Recent Labs     12/21/21 0608 12/22/21 0448 01/04/22 0522 01/05/22 0538 01/07/22 0540  NA 144   < > 144 145 144  K 3.9   < > 4.3 4.3 4.4  CL 102   < > 100 101 100  CO2 32   < > 35* 38* 38*  GLUCOSE 188*   < > 138* 141* 114*  BUN 56*   < > 48* 43* 46*  CREATININE 1.47*   < > 0.91 0.99 1.21*  CALCIUM 8.8*   < > 9.3 9.1 8.9  MG 1.9  --  1.7  --   --    < > = values in this interval not displayed.   Liver Function Tests: Recent Labs    01/03/22 1249  AST 19  ALT 14  ALKPHOS 69  BILITOT 0.8  PROT 6.2*  ALBUMIN 2.6*   No results for input(s): "LIPASE", "AMYLASE" in the last 8760 hours. No results for input(s): "AMMONIA" in the last 8760 hours. CBC: Recent Labs    12/21/21 0608 12/24/21 0445 01/03/22 1249  WBC 7.7 10.4 8.6  NEUTROABS  --   --  7.0  HGB 10.6* 10.6* 10.9*  HCT 33.5* 32.7* 36.7  MCV 91.8 91.1 97.1  PLT 204 183 409*   Cardiac Enzymes: No results for input(s): "CKTOTAL", "CKMB", "CKMBINDEX", "TROPONINI" in the last 8760 hours. BNP: Invalid input(s): "POCBNP" Lab Results  Component Value Date   HGBA1C 7.3 (H) 12/20/2021   No results found for: "TSH" No results found for: "VITAMINB12" No results found for: "FOLATE" No results found for: "IRON", "TIBC", "FERRITIN"  Imaging and Procedures obtained prior to SNF admission: DG Chest Portable 1 View  Result Date: 01/03/2022 CLINICAL DATA:  Provided history: CHF, leg swelling. EXAM: PORTABLE CHEST 1 VIEW COMPARISON:  Prior chest radiographs 12/20/2021 and earlier. FINDINGS: Patient rotation to the right. Cardiomegaly. Aortic atherosclerosis. Central pulmonary vascular  congestion and interstitial edema. Hazy opacity at the right lung base compatible with a layering pleural effusion. Trace left pleural effusion. Bibasilar atelectasis. No evidence of pneumothorax. Degenerative changes of the spine. IMPRESSION: Cardiomegaly with central pulmonary vascular congestion and interstitial edema. Layering right pleural effusion. Trace  left pleural effusion. Bibasilar atelectasis. Aortic Atherosclerosis (ICD10-I70.0). Electronically Signed   By: Kellie Simmering D.O.   On: 01/03/2022 13:22    Assessment/Plan 1. Acute on chronic diastolic CHF (congestive heart failure) [facility]) Patient with recent admission for volume overload. Symptoms improved after diuresis.  - Continue daily torsemide 40 mg daily.  - Continuous oxygen at [facility] for a goal of 90% - Continue ASA 81 mg qd - Metoprolol 25 mg BID - PT/OT to evaluate and treat  2. Type 2 diabetes mellitus with diabetic nephropathy, with long-term current use of insulin (HCC) Patient's diabetes well-controlled on current regimen. Patient has follow up with  Will forward questions from the family outlined above with social work, Engineer, site, and Occupational psychologist.  - Continue metformin '1000mg'$  BID.  - Continue detemir 5 units BID.  - Continue FSBS qAM and qhs  3. CKD stage G3a/A1, GFR 45-59 and albumin creatinine ratio <30 mg/g (HCC) Patient's kidney function was stable during hospitalization. Discussed concern that her follow up is in January. DIL states she will help with rescheduling appointment that Ms. Scalise missed earlier this month.  - BMP in 1 week  4. PVD (peripheral vascular disease) ([place]) Patient continues to have issues with pain in the calves and leg swelling. Cellulitis of lower extremities continues to improve.  - continue schedule tylenol 650 mg TID PRN for pain  5. Cellulitis of both lower extremities S/p treatment with oral antibiotics. Continue wound care with wound care nursing staff. Bandages clean, dry and intact. Continue to monitor for signs of worsening infection.   6. Essential (primary) hypertension BP slightly elevated this morning. Will continue current regimen and monitor for persistent elevation given recent transition to NF.  - Continue losartan '100mg'$  daily   7. Persistent atrial fibrillation (HCC) HR within normal limits - continue  metoprolol 25 mg BID.   8. Constipation slow exit transit Patient has gone 3 days without a bowel movement. Previously on Colace without relief - Start miralax 17 g daily - Start senna '8mg'$  2 tablets nightly - monitor closely for lose stools and need for deescalation of medication management.  - encourage hydration and fiber in diet.  - Continue probiotics  Family/ staff Communication: Spoke with son Jenny Reichmann and DIL.   Labs/tests ordered: BMP 01/13/2022  Tomasa Rand, MD, [place] [phone]

## 2022-01-10 ENCOUNTER — Encounter: Payer: Self-pay | Admitting: Student

## 2022-01-10 ENCOUNTER — Non-Acute Institutional Stay (SKILLED_NURSING_FACILITY): Payer: Medicare Other | Admitting: Student

## 2022-01-10 DIAGNOSIS — I5032 Chronic diastolic (congestive) heart failure: Secondary | ICD-10-CM | POA: Diagnosis not present

## 2022-01-10 NOTE — Progress Notes (Signed)
Location:   [facility]) Nursing Home Room Number: 093 GHWEX of Service:  Nursing [phone]) Provider:  Dewayne Shorter, MD   Pcp, No  Patient Care Team: Pcp, No as PCP - General  Extended Emergency Contact Information Primary Emergency Contact: [place] Mobile Phone: [phone] Relation: Son Secondary Emergency Contact: [place] Mobile Phone: [phone] Relation: Son  Code Status:  DNAR Goals of care: Advanced Directive information    01/04/2022    2:00 AM  Advanced Directives  Does Patient Have a Medical Advance Directive? Yes  Type of Advance Directive Living will;Healthcare Power of [place];Out of facility DNR (pink MOST or yellow form)  Does patient want to make changes to medical advance directive? No - Patient declined  Copy of [place] in Chart? No - copy requested     Chief Complaint  Patient presents with   Leg Pain    HPI:  Pt is a 88 y.o. female seen today for an acute visit for concern for leg pain. When attempting to ask questions regarding leg pain, patient interrupts and states the pain is bearable. Discussed with patient concern that she needs to participate in therapy and she states she cannot "participate in therapy in her condition" and doesn't understand why people continue to tell her she needs to. She describes her attempts to sit up on the side of the bed and stand were horrible. She keeps asking "What are they going to do for me? They won't help." She wants to "die at home since no one is doing anything for me medically." Immediately discussed with patient how cardiac condition and kidney disease is being managed by controlling her fluid status. Discussed importance of elevating feet in the setting of bilateral leg swelling. Patient will not refer to it as "leg swelling," and demands we use the terminology "edema." Demands a list of every exercise therapy wants to perform (however they haven't seen her since most  recent readmission) and a list of all of her medications (discussed full 2 times during current conversation). She is very upset there is an error for her aspiring - she has been taking 2 per day for 40 years. She is also upset that she has had diarrhea since starting miralax and senna.   Prefers [place] to be 1st for medical and for financial situations would be John.   Per conversation with care team and staff - Going home is not a safe option. Independent living staff will also likely deny her request. Will continue to support patient as well as possible.    Past Medical History:  Diagnosis Date   Atrial fibrillation ([place])    Coronary artery disease    Diabetes mellitus without complication ([place])    Hypertension    Meningioma ([place])    Left frontal lobe   Past Surgical History:  Procedure Laterality Date   APPENDECTOMY     CHOLECYSTECTOMY     MELANOMA REMOVAL Right     Allergies  Allergen Reactions   Alendronate Other (See Comments)    Cramping of the jaw   Iodinated Contrast Media Other (See Comments)    Brother has allergy and she wants not to receive it    Statins Other (See Comments)    Fatigue/muscle weakness Fatigue/muscle weakness Fatigue/muscle weakness    Outpatient Encounter Medications as of 01/10/2022  Medication Sig   acetaminophen (TYLENOL) 325 MG tablet Take 2 tablets (650 mg total) by mouth every 6 (six) hours as needed for mild pain or fever.  aspirin EC 81 MG tablet Take 1 tablet (81 mg total) by mouth daily. Swallow whole.   docusate sodium (COLACE) 100 MG capsule Take 100 mg by mouth 2 (two) times daily as needed. Per patient's request   glucose blood (ONETOUCH ULTRA) test strip USE AS DIRECTED FOUR TIMES DAILY *INSURANCE MAX OF 3 TIMES DAILY*   insulin detemir (LEVEMIR) 100 UNIT/ML injection Inject 0.05 mLs (5 Units total) into the skin 2 (two) times daily.   Insulin Syringe-Needle U-100 (BD VEO INSULIN SYRINGE U/F) 31G X 15/64" 0.3 ML MISC USE TO INJECT  TWICE A DAY (SIZING CHANGED DUE TO MRS Suhr'S CHOICE)   LOSARTAN POTASSIUM PO Take 100 mg by mouth at bedtime.   metFORMIN (GLUCOPHAGE) 1000 MG tablet 1,000 mg 2 (two) times daily with a meal.    metoprolol tartrate (LOPRESSOR) 25 MG tablet Take 1 tablet by mouth 2 (two) times daily.   saccharomyces boulardii (FLORASTOR) 250 MG capsule Take 250 mg by mouth daily.   torsemide (DEMADEX) 20 MG tablet Take 40 mg by mouth daily.   [DISCONTINUED] dextromethorphan-guaiFENesin (MUCINEX DM) 30-600 MG 12hr tablet Take 1 tablet by mouth 2 (two) times daily as needed for cough.   [DISCONTINUED] polyethylene glycol (MIRALAX / GLYCOLAX) 17 g packet Take 17 g by mouth daily.   [DISCONTINUED] senna (SENOKOT) 8.6 MG TABS tablet Take 2 tablets by mouth at bedtime.   No facility-administered encounter medications on file as of 01/10/2022.    Review of Systems  Constitutional:  Positive for activity change.  Respiratory:  Positive for shortness of breath. Negative for chest tightness.   Gastrointestinal:  Positive for diarrhea.    Immunization History  Administered Date(s) Administered   Influenza, High Dose Seasonal PF 02/24/2019   Influenza-Unspecified 04/14/2017   Moderna Sars-Covid-2 Vaccination 04/25/2020   Pneumococcal Polysaccharide-23 05/19/2014   Varicella 02/12/2017   Zoster Recombinat (Shingrix) 01/09/2018   Pertinent  Health Maintenance Due  Topic Date Due   FOOT EXAM  Never done   OPHTHALMOLOGY EXAM  Never done   INFLUENZA VACCINE  12/10/2021   HEMOGLOBIN A1C  06/22/2022   DEXA SCAN  Completed      01/04/2022   11:50 AM 01/04/2022   10:00 PM 01/05/2022    9:00 AM 01/05/2022    7:34 PM 01/06/2022   11:00 PM  Fall Risk  Patient Fall Risk Level High fall risk High fall risk High fall risk High fall risk High fall risk   Functional Status Survey:    Vitals:   01/10/22 0912  BP: (!) 150/70  Pulse: 74  Resp: 20  Temp: 97.7 F (36.5 C)  SpO2: 97%  Weight: 190 lb 9.6 oz (86.5  kg)   Body mass index is 32.21 kg/m. Physical Exam Constitutional:      Comments: Laying slumped in bed on three pillows. Glasses in place  Cardiovascular:     Rate and Rhythm: Normal rate.  Pulmonary:     Effort: Pulmonary effort is normal.     Comments: Lungs clear to ausculation Abdominal:     General: Bowel sounds are normal.     Palpations: Abdomen is soft.  Musculoskeletal:     Comments: 3+ bilateral pedal edema   Neurological:     Mental Status: She is alert.  Psychiatric:     Comments: Patient is yelling and hysterical throughout conversation. Interrupts sentences and yells with interruption for clarifying questions.      Labs reviewed: Recent Labs    12/21/21 0608 12/22/21 0448  01/04/22 0522 01/05/22 0538 01/07/22 0540  NA 144   < > 144 145 144  K 3.9   < > 4.3 4.3 4.4  CL 102   < > 100 101 100  CO2 32   < > 35* 38* 38*  GLUCOSE 188*   < > 138* 141* 114*  BUN 56*   < > 48* 43* 46*  CREATININE 1.47*   < > 0.91 0.99 1.21*  CALCIUM 8.8*   < > 9.3 9.1 8.9  MG 1.9  --  1.7  --   --    < > = values in this interval not displayed.   Recent Labs    01/03/22 1249  AST 19  ALT 14  ALKPHOS 69  BILITOT 0.8  PROT 6.2*  ALBUMIN 2.6*   Recent Labs    12/21/21 0608 12/24/21 0445 01/03/22 1249  WBC 7.7 10.4 8.6  NEUTROABS  --   --  7.0  HGB 10.6* 10.6* 10.9*  HCT 33.5* 32.7* 36.7  MCV 91.8 91.1 97.1  PLT 204 183 409*   No results found for: "TSH" Lab Results  Component Value Date   HGBA1C 7.3 (H) 12/20/2021   Lab Results  Component Value Date   CHOL 188 12/20/2021   HDL 66 12/20/2021   LDLCALC 106 (H) 12/20/2021   TRIG 78 12/20/2021   CHOLHDL 2.8 12/20/2021    Significant Diagnostic Results in last 30 days:  CT CHEST WO CONTRAST  Result Date: 01/05/2022 CLINICAL DATA:  Follow-up abnormal lung nodule on x-ray. EXAM: CT CHEST WITHOUT CONTRAST TECHNIQUE: Multidetector CT imaging of the chest was performed following the standard protocol without  IV contrast. RADIATION DOSE REDUCTION: This exam was performed according to the departmental dose-optimization program which includes automated exposure control, adjustment of the mA and/or kV according to patient size and/or use of iterative reconstruction technique. COMPARISON:  None Available. FINDINGS: Cardiovascular: Marked severity calcification of the thoracic aorta is seen, without evidence of aortic aneurysm. There is mild cardiomegaly with marked severity coronary artery calcification. No pericardial effusion. Mediastinum/Nodes: Numerous subcentimeter AP window and pretracheal lymph nodes are seen. A 2.0 cm x 1.3 cm nodule versus cyst is seen within the right lobe of the thyroid gland. Occluded terminal bronchioles are suspected within the bilateral lower lobes. The trachea and esophagus demonstrate no significant findings. Lungs/Pleura: A 16 mm x 8 mm lobulated noncalcified lung nodule is seen within the posterolateral aspect of the left upper lobe. Mild left lower lobe and moderate severity right lower lobe compressive atelectasis is seen. Mild posteromedial right upper lobe atelectatic changes are also noted. Small to moderate size bilateral pleural effusions are seen. No pneumothorax is identified. Upper Abdomen: No acute abnormality. Musculoskeletal: Chronic compression fracture deformities are seen involving the T10 and T12 vertebral bodies. Extensive areas of lucency are seen within the anterior aspects of numerous thoracic spine vertebral bodies. Multilevel degenerative changes are noted throughout the thoracic spine IMPRESSION: 1. 16 mm x 8 mm left upper lobe noncalcified lung nodule. Consider one of the following in 3 months for both low-risk and high-risk individuals: (a) repeat chest CT, (b) follow-up PET-CT, or (c) tissue sampling. This recommendation follows the consensus statement: Guidelines for Management of Incidental Pulmonary Nodules Detected on CT Images: From the Fleischner Society  2017; Radiology 2017; 284:228-243. 2. Small to moderate size bilateral pleural effusions with bilateral lower lobe compressive atelectasis. 3. T10 and T12 chronic compression fracture deformities with large lucent areas seen within the anterior aspects  of numerous thoracic spine vertebral bodies. Correlation with MRI is recommended. 4. 2.0 cm x 1.3 cm nodule versus cyst is seen within the right lobe of the thyroid gland. Further evaluation with nonemergent thyroid ultrasound is recommended. This follows ACR consensus guidelines: Managing Incidental Thyroid Nodules Detected on Imaging: White Paper of the ACR Incidental Thyroid Findings Committee. J Am Coll Radiol 2015; 12:143-150. Aortic Atherosclerosis (ICD10-I70.0). Electronically Signed   By: Virgina Norfolk M.D.   On: 01/05/2022 23:07   DG Chest 2 View  Result Date: 01/05/2022 CLINICAL DATA:  CHF.  Hypertension.  Diabetes. EXAM: CHEST - 2 VIEW COMPARISON:  January 03, 2022 FINDINGS: Stable cardiomegaly. The hila and mediastinum are unchanged. No pneumothorax. Bilateral pleural effusions with underlying opacities, increased. Nodular opacity in the lateral left mid lung does not definitely correlate with a specific rib, measuring 13 mm. No other acute abnormalities. IMPRESSION: 1. Increasing bilateral pleural effusions with underlying opacities, likely atelectasis. No overt edema. 2. There is a 13 mm nodule over the left lateral chest which does not definitely correlate with a specific rib and could represent a pulmonary nodule. Recommend a CT scan for further evaluation. 3. No other changes. Electronically Signed   By: Dorise Bullion III M.D.   On: 01/05/2022 08:38   DG Chest Portable 1 View  Result Date: 01/03/2022 CLINICAL DATA:  Provided history: CHF, leg swelling. EXAM: PORTABLE CHEST 1 VIEW COMPARISON:  Prior chest radiographs 12/20/2021 and earlier. FINDINGS: Patient rotation to the right. Cardiomegaly. Aortic atherosclerosis. Central pulmonary  vascular congestion and interstitial edema. Hazy opacity at the right lung base compatible with a layering pleural effusion. Trace left pleural effusion. Bibasilar atelectasis. No evidence of pneumothorax. Degenerative changes of the spine. IMPRESSION: Cardiomegaly with central pulmonary vascular congestion and interstitial edema. Layering right pleural effusion. Trace left pleural effusion. Bibasilar atelectasis. Aortic Atherosclerosis (ICD10-I70.0). Electronically Signed   By: Kellie Simmering D.O.   On: 01/03/2022 13:22   ECHOCARDIOGRAM COMPLETE  Result Date: 12/21/2021    ECHOCARDIOGRAM REPORT   Patient Name:   Fanta Fosberg Date of Exam: 12/21/2021 Medical Rec #:  [id number]   Height:       64.5 in Accession #:    [id number]  Weight:       199.3 lb Date of Birth:  [birth year]   BSA:          1.964 m Patient Age:    6 years    BP:           155/66 mmHg Patient Gender: F           HR:           64 bpm. Exam Location:  [facility] Procedure: 2D Echo, Color Doppler and Cardiac Doppler Indications:     I50.31 congestive heart failure-Acute Diastolic  History:         Patient has prior history of Echocardiogram examinations, most                  recent 07/21/2018. CAD, Arrythmias:Atrial Fibrillation; Risk                  Factors:Hypertension and Diabetes.  Sonographer:     Charmayne Sheer Referring Phys:  [place] Diagnosing Phys: Skeet Latch MD  Sonographer Comments: Suboptimal parasternal window. Image acquisition challenging due to patient body habitus. IMPRESSIONS  1. Left ventricular ejection fraction, by estimation, is 60 to 65%. The left ventricle has normal function. The left ventricle has no regional wall motion  abnormalities. Left ventricular diastolic parameters are indeterminate.  2. Right ventricular systolic function is mildly reduced. The right ventricular size is normal. There is normal pulmonary artery systolic pressure.  3. Left atrial size was severely dilated.  4. Right atrial size was severely  dilated.  5. The mitral valve is normal in structure. No evidence of mitral valve regurgitation. No evidence of mitral stenosis. Moderate mitral annular calcification.  6. The aortic valve is tricuspid. There is mild calcification of the aortic valve. There is mild thickening of the aortic valve. Aortic valve regurgitation is not visualized. No aortic stenosis is present.  7. The inferior vena cava is dilated in size with <50% respiratory variability, suggesting right atrial pressure of 15 mmHg. FINDINGS  Left Ventricle: Left ventricular ejection fraction, by estimation, is 60 to 65%. The left ventricle has normal function. The left ventricle has no regional wall motion abnormalities. The left ventricular internal cavity size was normal in size. There is  no left ventricular hypertrophy. Left ventricular diastolic function could not be evaluated due to atrial fibrillation. Left ventricular diastolic parameters are indeterminate. Right Ventricle: The right ventricular size is normal. No increase in right ventricular wall thickness. Right ventricular systolic function is mildly reduced. There is normal pulmonary artery systolic pressure. The tricuspid regurgitant velocity is 2.24 m/s, and with an assumed right atrial pressure of 15 mmHg, the estimated right ventricular systolic pressure is 78.2 mmHg. Left Atrium: Left atrial size was severely dilated. Right Atrium: Right atrial size was severely dilated. Pericardium: There is no evidence of pericardial effusion. Mitral Valve: The mitral valve is normal in structure. Moderate mitral annular calcification. No evidence of mitral valve regurgitation. No evidence of mitral valve stenosis. Tricuspid Valve: The tricuspid valve is normal in structure. Tricuspid valve regurgitation is trivial. No evidence of tricuspid stenosis. Aortic Valve: The aortic valve is tricuspid. There is mild calcification of the aortic valve. There is mild thickening of the aortic valve. Aortic  valve regurgitation is not visualized. No aortic stenosis is present. Aortic valve mean gradient measures 8.0 mmHg. Aortic valve peak gradient measures 14.9 mmHg. Aortic valve area, by VTI measures 0.79 cm. Pulmonic Valve: The pulmonic valve was normal in structure. Pulmonic valve regurgitation is trivial. No evidence of pulmonic stenosis. Aorta: The aortic root is normal in size and structure. Venous: The inferior vena cava is dilated in size with less than 50% respiratory variability, suggesting right atrial pressure of 15 mmHg. IAS/Shunts: No atrial level shunt detected by color flow Doppler.  LEFT VENTRICLE PLAX 2D LVOT diam:     1.60 cm     Diastology LV SV:         31          LV e' medial:    5.66 cm/s LV SV Index:   16          LV E/e' medial:  19.6 LVOT Area:     2.01 cm    LV e' lateral:   6.64 cm/s                            LV E/e' lateral: 16.7  LV Volumes (MOD) LV vol d, MOD A2C: 66.7 ml LV vol d, MOD A4C: 75.9 ml LV vol s, MOD A2C: 25.3 ml LV vol s, MOD A4C: 31.2 ml LV SV MOD A2C:     41.4 ml LV SV MOD A4C:     75.9 ml LV SV MOD  BP:      43.6 ml RIGHT VENTRICLE RV Basal diam:  4.58 cm LEFT ATRIUM             Index        RIGHT ATRIUM           Index LA Vol (A2C):   78.9 ml 40.18 ml/m  RA Area:     28.70 cm LA Vol (A4C):   70.1 ml 35.69 ml/m  RA Volume:   110.00 ml 56.01 ml/m LA Biplane Vol: 76.8 ml 39.11 ml/m  AORTIC VALVE                     PULMONIC VALVE AV Area (Vmax):    0.67 cm      PV Vmax:       0.83 m/s AV Area (Vmean):   0.70 cm      PV Peak grad:  2.8 mmHg AV Area (VTI):     0.79 cm AV Vmax:           193.00 cm/s AV Vmean:          136.000 cm/s AV VTI:            0.388 m AV Peak Grad:      14.9 mmHg AV Mean Grad:      8.0 mmHg LVOT Vmax:         64.00 cm/s LVOT Vmean:        47.600 cm/s LVOT VTI:          0.153 m LVOT/AV VTI ratio: 0.39  AORTA Ao Root diam: 3.00 cm MITRAL VALVE                TRICUSPID VALVE MV Area (PHT): 3.42 cm     TR Peak grad:   20.1 mmHg MV Decel Time: 222  msec     TR Vmax:        224.00 cm/s MV E velocity: 111.00 cm/s MV A velocity: 34.30 cm/s   SHUNTS MV E/A ratio:  3.24         Systemic VTI:  0.15 m                             Systemic Diam: 1.60 cm Skeet Latch MD Electronically signed by Skeet Latch MD Signature Date/Time: 12/21/2021/4:37:18 PM    Final    US Venous Img Lower Bilateral (DVT)  Result Date: 12/20/2021 CLINICAL DATA:  Pain and edema EXAM: BILATERAL LOWER EXTREMITY VENOUS DOPPLER ULTRASOUND TECHNIQUE: Gray-scale sonography with compression, as well as color and duplex ultrasound, were performed to evaluate the deep venous system(s) from the level of the common femoral vein through the popliteal and proximal calf veins. COMPARISON:  None Available. FINDINGS: VENOUS Normal compressibility of the common femoral, superficial femoral, and popliteal veins, as well as the visualized calf veins. Visualized portions of profunda femoral vein and great saphenous vein unremarkable. No filling defects to suggest DVT on grayscale or color Doppler imaging. Doppler waveforms show normal direction of venous flow, normal respiratory plasticity and response to augmentation. Limited views of the contralateral common femoral vein are unremarkable. OTHER None. Limitations: none IMPRESSION: Negative. Electronically Signed   By: Dorise Bullion III M.D.   On: 12/20/2021 17:52   DG Chest 2 View  Result Date: 12/20/2021 CLINICAL DATA:  Shortness of breath.  Bilateral leg swelling. EXAM: CHEST - 2 VIEW COMPARISON:  None Available. FINDINGS: Cardiomegaly. The  hila and mediastinum are unremarkable. No pneumothorax. Compression fracture of a lower thoracic vertebral body with associated kyphosis. Small bilateral pleural effusions. Nodularity projected over the lateral left chest favored to represent healed rib fractures. No other acute abnormalities. IMPRESSION: 1. Cardiomegaly. 2. Compression fracture of a lower thoracic vertebral body with associated kyphosis,  age indeterminate. 3. Small bilateral pleural effusions. 4. Nodularity projected over the lateral left chest favored to represent healed rib fractures. Electronically Signed   By: Dorise Bullion III M.D.   On: 12/20/2021 15:38    Assessment/Plan 1. Chronic diastolic CHF (congestive heart failure) ([place]) Patient with tenuous cardiac function. She has had two hospitalizations for the same issue in the last month. She is currently stable on 2LNC, lungs clear, continues to have 3+ pitting edema of the legs. Informed patient of my concerns that her cardiac and renal function are beyond meaningful recovery in the absence of physical therapy and activity. Patient is adamant that she know exactly every exercise required of her prior to accepting any additional physical therapy. Patient also desires to return home with a private paid aid. Upon discussion with healthcare center care team, concern for patient's safety at home and even for transports to specialist appointments at [facility]. She is currently unable to sit up on her own volition with core strength. She requires two person assist for transfers and a hoyer lift. Patient has declined therapy in the inpatient and now skilled level of care. For medical visits she will require a family member (all live out of state) or two nurses to assist. Will need to coordinate additional conversations with independent living staff for the possibility of patient returning home against medical advice. She is not safe to return home alone. She is unable to independently care for herself for personal hygiene and medication administration. She often interrupts conversation during counseling making it difficult to communicate therapeutic advice from this author as well as therapy staff. Given patient's preference to only go to [facility], discussed legal ramifications for emergency vehicles to bypass a local hospital. Patient endorsed understanding, however, states she would  like to private pay to get to her preferred hospital.  - Consult for outpatient palliative care with [facility] - new consultation to physical therapy (previously discontinued since patient did not participate while inpatient the consultation was not renewed).  - Attempt wheelchair safety assessment prior to appointment Wednesday - if unable to sit in chair, will request video visit of endocrinologist.  - engage family for assistance  - Patient would like to take ASA '81mg'$  BID against medical advice   Family/ staff Communication: Communicated with Education officer, museum, Scientist, physiological, Occupational psychologist, patient's nurse.   Unable to contact family on DOS.  Labs/tests ordered:  none  Attestation: I spent >1 hour at patient's bedside discussing her current and previous medical treatment plan. This discussion included goals of care for palliative measures as well as her desire to go to [facility] if she is to have any medical decompensation.   Tomasa Rand, MD, [facility] [phone]

## 2022-01-14 LAB — BASIC METABOLIC PANEL
BUN: 38 — AB (ref 4–21)
CO2: 38 — AB (ref 13–22)
Chloride: 98 — AB (ref 99–108)
Creatinine: 1 (ref 0.5–1.1)
Glucose: 104
Potassium: 4.6 mEq/L (ref 3.5–5.1)
Sodium: 141 (ref 137–147)

## 2022-01-14 LAB — COMPREHENSIVE METABOLIC PANEL
Calcium: 8.9 (ref 8.7–10.7)
eGFR: 52

## 2022-01-15 ENCOUNTER — Ambulatory Visit: Payer: PRIVATE HEALTH INSURANCE | Admitting: Family

## 2022-01-17 ENCOUNTER — Non-Acute Institutional Stay: Payer: Medicare Other | Admitting: Student

## 2022-01-17 DIAGNOSIS — Z515 Encounter for palliative care: Secondary | ICD-10-CM

## 2022-01-17 DIAGNOSIS — Z7189 Other specified counseling: Secondary | ICD-10-CM

## 2022-01-22 NOTE — Progress Notes (Signed)
[facility] Consult Note Telephone: [phone]  Fax: [phone]   Date of encounter: 01/17/2022  PATIENT NAME: [name] [place] [address]   [phone] (home)  DOB: 24-May-1930 MRN: [id number] PRIMARY CARE PROVIDER:    Dr. Dewayne Shorter  REFERRING PROVIDER:   Dr. Dewayne Shorter  RESPONSIBLE PARTY:    Contact Information     Name [place] Son   [phone]   Luci Bank   [phone]   Satira Sark   [phone]        I met face to face with patient in the facility. Palliative Care was asked to follow this patient by consultation request of  Dr. Dewayne Shorter to address advance care planning and complex medical decision making. This is the initial visit.                                     ASSESSMENT AND PLAN / RECOMMENDATIONS:   Advance Care Planning/Goals of Care: Goals include to maximize quality of life and symptom management. Patient/health care surrogate gave his/her permission to discuss.Our advance care planning conversation included a discussion about:    The value and importance of advance care planning  Experiences with loved ones who have been seriously ill or have died  Exploration of personal, cultural or spiritual beliefs that might influence medical decisions  Exploration of goals of care in the event of a sudden injury or illness  CODE STATUS: DNR  Education provided on Palliative Medicine vs. Hospice services. Patient expresses she wants to return home, continue therapy. She has received therapy x 1. She becomes very upset during conversation, stating she does not need palliative medicine, stating she is currently on a palliative unit at [facility], NP attempted to explain the difference in setting and she became further upset, yelling. Attempted to provide education on palliative in the community setting. She states she is  not receiving adequate "medical attention" currently. Patient states that she did not request palliative medicine, therefore would not be "paying for any services." She declines further visits from palliative, stating this is not what she needs. I did leave palliative folder for her to review should she change her mind. She will be discharged from palliative at this time. Emmett made aware of conversation. She is to notify palliative should patient agree to services.   Symptom Management/Plan:  ACP: Palliative NP attempted conversation on goals of care, ACP. Patient became upset during conversation; difficult to redirect during conversation. She does express wanting to improve to be able to return home, otherwise patient is resistant to having conversation. She declines further visits from palliative medicine, stating she did not request services. Palliative folder left for patient to review should she change her mind; nursing staff made aware.   Follow up Palliative Care Visit: Palliative care will continue to follow for complex medical decision making, advance care planning, and clarification of goals.   I spent 20 minutes providing this consultation. More than 50% of the time in this consultation was spent in counseling and care coordination.   HOSPICE ELIGIBILITY/DIAGNOSIS: TBD  Chief Complaint: Palliative Medicine initial consult.  HISTORY OF PRESENT ILLNESS:  Lynea Rollison is a 88 y.o. year old female  with hypertensive heart and chronic kidney disease, chronic diastolic heart failure, atrial fibrillation, ASCVD, generalized weakness, cellulitis of bilateral lower extremities,  T2DM.  Patient currently at [facility] on SNF unit. She has lived on campus for 8 years. She is dependent for adl's. Staff report patient not getting out of bed, resistant to care. She is wearing oxygen continuously at 2 lpm. She endorses LE edema, states it has improved some. She reports her blood pressure  being elevated today, stating "this is a sign of something going on." Staff report checking her blood pressure routinely.  Patient received resting in bed. Explained reason for palliative NP visit. She declines for NP to assess her further, including checking her vitals signs. Staff is made aware.   History obtained from review of EMR, discussion with primary team, and interview with family, facility staff/caregiver and/or Ms. Dockery.  I reviewed available labs, medications, imaging, studies and related documents from the EMR.  Records reviewed and summarized above.   Physical Exam: Weight: 194. 2 pounds Constitutional: NAD General: frail appearing, obese  EYES: anicteric sclera, lids intact, no discharge  ENMT: intact hearing,  dentition intact CV: 2+ LE edema Pulmonary: no increased work of breathing, no cough, oxygen via nasal canula at 2 lpm GU: deferred MSK:  moves all extremities Skin: no rashes or wounds on visible skin Neuro: + generalized weakness, A & O x 3 Psych: agitated, poor insight  Hem/lymph/immuno: no widespread bruising CURRENT PROBLEM LIST:  Patient Active Problem List   Diagnosis Date Noted   Metabolic alkalosis with respiratory acidosis 01/05/2022   Chronic kidney disease, stage 3a ([place]) 01/03/2022   Candidiasis, vulva 01/02/2022   Acute on chronic diastolic CHF (congestive heart failure) ([place]) 12/20/2021   Coronary artery disease    Type II diabetes mellitus with renal manifestations ([place])    Myocardial injury    Elevated lactic acid level    Cellulitis of left lower extremity    Statin intolerance 11/01/2020   Cellulitis of foot, right 04/09/2020   CKD stage G3a/A1, GFR 45-59 and albumin creatinine ratio <30 mg/g (HCC) 05/10/2019   Failure of outpatient treatment    Hyperkalemia 04/01/2019   Chronic diastolic CHF (congestive heart failure) ([place]) 03/30/2019   PAD (peripheral artery disease) ([place]) 03/30/2019   Cellulitis of both lower extremities  03/29/2019   Postmenopausal osteoporosis 05/18/2017   Carpal tunnel syndrome on right 08/27/2016   Atrial fibrillation and flutter ([place]) 07/14/2016   Backache 06/27/2016   Aortic heart murmur 09/10/2015   Macroalbuminuric diabetic nephropathy ([place]) 06/25/2015   Essential (primary) hypertension 12/06/2014   Cerebral meningioma ([place]) 86/57/8469   Complication of diabetes mellitus ([place]) 05/19/2014   History of neoplasm of bladder 05/19/2014   Personal history of healed traumatic fracture 05/19/2014   Diabetes mellitus type 2, controlled, with complications ([place]) 62/95/2841   PAST MEDICAL HISTORY:  Active Ambulatory Problems    Diagnosis Date Noted   Cerebral meningioma ([place]) 32/44/0102   Complication of diabetes mellitus ([place]) 05/19/2014   Essential (primary) hypertension 12/06/2014   History of neoplasm of bladder 05/19/2014   Personal history of healed traumatic fracture 05/19/2014   Macroalbuminuric diabetic nephropathy ([place]) 06/25/2015   Aortic heart murmur 09/10/2015   Atrial fibrillation and flutter ([place]) 07/14/2016   Backache 06/27/2016   Carpal tunnel syndrome on right 08/27/2016   Diabetes mellitus type 2, controlled, with complications ([place]) 72/53/6644   Postmenopausal osteoporosis 05/18/2017   Cellulitis of both lower extremities 03/29/2019   Chronic diastolic CHF (congestive heart failure) ([place]) 03/30/2019   PAD (peripheral artery disease) ([place]) 03/30/2019   Hyperkalemia 04/01/2019   Cellulitis of foot, right 04/09/2020  Failure of outpatient treatment    CKD stage G3a/A1, GFR 45-59 and albumin creatinine ratio <30 mg/g (HCC) 05/10/2019   Acute on chronic diastolic CHF (congestive heart failure) ([place]) 12/20/2021   Coronary artery disease    Type II diabetes mellitus with renal manifestations (HCC)    Myocardial injury    Elevated lactic acid level    Cellulitis of left lower extremity    Candidiasis, vulva 01/02/2022   Chronic kidney disease, stage 3a ([place])  16/55/3748   Metabolic alkalosis with respiratory acidosis 01/05/2022   Statin intolerance 11/01/2020   Resolved Ambulatory Problems    Diagnosis Date Noted   ARF (acute renal failure) ([place]) 03/29/2019   Atherosclerosis of native arteries of the extremities with ulceration ([place]) 05/10/2019   Acute renal failure superimposed on stage 3a chronic kidney disease (HCC)    Past Medical History:  Diagnosis Date   Atrial fibrillation ([place])    Diabetes mellitus without complication ([place])    Hypertension    Meningioma ([place])    SOCIAL HX:  Social History   Tobacco Use   Smoking status: Never   Smokeless tobacco: Never  Substance Use Topics   Alcohol use: Yes    Alcohol/week: 1.0 standard drink of alcohol    Types: 1 Glasses of wine per week   FAMILY HX:  Family History  Problem Relation Age of Onset   Stroke Brother    Lung cancer Brother       ALLERGIES:  Allergies  Allergen Reactions   Alendronate Other (See Comments)    Cramping of the jaw   Iodinated Contrast Media Other (See Comments)    Brother has allergy and she wants not to receive it    Statins Other (See Comments)    Fatigue/muscle weakness Fatigue/muscle weakness Fatigue/muscle weakness     PERTINENT MEDICATIONS:  Outpatient Encounter Medications as of 01/17/2022  Medication Sig   acetaminophen (TYLENOL) 325 MG tablet Take 2 tablets (650 mg total) by mouth every 6 (six) hours as needed for mild pain or fever.   aspirin EC 81 MG tablet Take 1 tablet (81 mg total) by mouth daily. Swallow whole.   docusate sodium (COLACE) 100 MG capsule Take 100 mg by mouth 2 (two) times daily as needed. Per patient's request   glucose blood (ONETOUCH ULTRA) test strip USE AS DIRECTED FOUR TIMES DAILY *INSURANCE MAX OF 3 TIMES DAILY*   insulin detemir (LEVEMIR) 100 UNIT/ML injection Inject 0.05 mLs (5 Units total) into the skin 2 (two) times daily.   Insulin Syringe-Needle U-100 (BD VEO INSULIN SYRINGE U/F) 31G X 15/64" 0.3 ML MISC  USE TO INJECT TWICE A DAY (SIZING CHANGED DUE TO MRS Aranas'S CHOICE)   LOSARTAN POTASSIUM PO Take 100 mg by mouth at bedtime.   metFORMIN (GLUCOPHAGE) 1000 MG tablet 1,000 mg 2 (two) times daily with a meal.    metoprolol tartrate (LOPRESSOR) 25 MG tablet Take 1 tablet by mouth 2 (two) times daily.   saccharomyces boulardii (FLORASTOR) 250 MG capsule Take 250 mg by mouth daily.   torsemide (DEMADEX) 20 MG tablet Take 40 mg by mouth daily.   No facility-administered encounter medications on file as of 01/17/2022.   Thank you for the opportunity to participate in the care of Ms. Elahi.  The palliative care team will continue to follow. Please call our office at [phone] if we can be of additional assistance.   Ezekiel Slocumb, NP   COVID-19 PATIENT SCREENING TOOL Asked and negative response unless otherwise  noted:  Have you had symptoms of covid, tested positive or been in contact with someone with symptoms/positive test in the past 5-10 days? No

## 2022-01-24 ENCOUNTER — Non-Acute Institutional Stay (SKILLED_NURSING_FACILITY): Payer: Medicare Other | Admitting: Student

## 2022-01-24 ENCOUNTER — Encounter: Payer: Self-pay | Admitting: Student

## 2022-01-24 DIAGNOSIS — I5033 Acute on chronic diastolic (congestive) heart failure: Secondary | ICD-10-CM | POA: Diagnosis not present

## 2022-01-24 NOTE — Progress Notes (Signed)
Location:   [facility] Room Number: 400 Place of Service:  SNF [phone]) Provider:  Dewayne Shorter, MD  Pcp, No  Patient Care Team: Pcp, No as PCP - General  Extended Emergency Contact Information Primary Emergency Contact: [place] Mobile Phone: [phone] Relation: Son Secondary Emergency Contact: [place] Mobile Phone: [phone] Relation: Son  Code Status:  DNR Goals of care: Advanced Directive information    01/04/2022    2:00 AM  Advanced Directives  Does Patient Have a Medical Advance Directive? Yes  Type of Advance Directive Living will;Healthcare Power of [place];Out of facility DNR (pink MOST or yellow form)  Does patient want to make changes to medical advance directive? No - Patient declined  Copy of [place] in Chart? No - copy requested     Chief Complaint  Patient presents with   Acute Visit    Weight gain and shortness of breath    HPI:  Pt is a 88 y.o. female seen today for an acute visit for    Past Medical History:  Diagnosis Date   Atrial fibrillation ([place])    Coronary artery disease    Diabetes mellitus without complication ([place])    Hypertension    Meningioma ([place])    Left frontal lobe   Past Surgical History:  Procedure Laterality Date   APPENDECTOMY     CHOLECYSTECTOMY     MELANOMA REMOVAL Right     Allergies  Allergen Reactions   Alendronate Other (See Comments)    Cramping of the jaw   Iodinated Contrast Media Other (See Comments)    Brother has allergy and she wants not to receive it    Statins Other (See Comments)    Fatigue/muscle weakness Fatigue/muscle weakness Fatigue/muscle weakness    Allergies as of 01/24/2022       Reactions   Alendronate Other (See Comments)   Cramping of the jaw   Iodinated Contrast Media Other (See Comments)   Brother has allergy and she wants not to receive it    Statins Other (See Comments)   Fatigue/muscle  weakness Fatigue/muscle weakness Fatigue/muscle weakness        Medication List        Accurate as of January 24, 2022  3:25 PM. If you have any questions, ask your nurse or doctor.          acetaminophen 325 MG tablet Commonly known as: TYLENOL Take 2 tablets (650 mg total) by mouth every 6 (six) hours as needed for mild pain or fever.   aspirin EC 81 MG tablet Take 1 tablet (81 mg total) by mouth daily. Swallow whole.   BD Veo Insulin Syringe U/F 31G X 15/64" 0.3 ML Misc Generic drug: Insulin Syringe-Needle U-100 USE TO INJECT TWICE A DAY (SIZING CHANGED DUE TO MRS Uddin'S CHOICE)   docusate sodium 100 MG capsule Commonly known as: COLACE Take 100 mg by mouth 2 (two) times daily as needed. Per patient's request   insulin detemir 100 UNIT/ML injection Commonly known as: Levemir Inject 0.05 mLs (5 Units total) into the skin 2 (two) times daily.   LOSARTAN POTASSIUM PO Take 100 mg by mouth at bedtime.   metFORMIN 1000 MG tablet Commonly known as: GLUCOPHAGE 1,000 mg 2 (two) times daily with a meal.   metoprolol tartrate 25 MG tablet Commonly known as: LOPRESSOR Take 1 tablet by mouth 2 (two) times daily.   OneTouch Ultra test strip Generic drug: glucose blood USE AS DIRECTED FOUR TIMES  DAILY *INSURANCE MAX OF 3 TIMES DAILY*   saccharomyces boulardii 250 MG capsule Commonly known as: FLORASTOR Take 250 mg by mouth daily.   torsemide 20 MG tablet Commonly known as: DEMADEX Take 40 mg by mouth daily.        Review of Systems  Respiratory:  Positive for shortness of breath.     Immunization History  Administered Date(s) Administered   Influenza, High Dose Seasonal PF 02/24/2019   Influenza-Unspecified 04/14/2017   Moderna Sars-Covid-2 Vaccination 04/25/2020   Pneumococcal Polysaccharide-23 05/19/2014   Varicella 02/12/2017   Zoster Recombinat (Shingrix) 01/09/2018   Pertinent  Health Maintenance Due  Topic Date Due   FOOT EXAM  Never done    OPHTHALMOLOGY EXAM  Never done   INFLUENZA VACCINE  12/10/2021   HEMOGLOBIN A1C  06/22/2022   DEXA SCAN  Completed      01/04/2022   11:50 AM 01/04/2022   10:00 PM 01/05/2022    9:00 AM 01/05/2022    7:34 PM 01/06/2022   11:00 PM  Fall Risk  Patient Fall Risk Level High fall risk High fall risk High fall risk High fall risk High fall risk   Functional Status Survey:    Vitals:   01/24/22 1518  BP: (!) 173/71  Pulse: 71  Resp: 20  Temp: 97.7 F (36.5 C)  SpO2: 93%  Weight: 193 lb 14.4 oz (88 kg)  Height: 5' 4.5" (1.638 m)   Body mass index is 32.77 kg/m. Physical Exam Pulmonary:     Comments: Able to speak in complete sentences. Neurological:     Mental Status: She is alert and oriented to person, place, and time.  Psychiatric:     Comments: "Please get out I am about to have a bowel movement."   Exam limited and interrupted by patient refusal for encounter.   Labs reviewed: Recent Labs    12/21/21 0608 12/22/21 0448 01/04/22 0522 01/05/22 0538 01/07/22 0540  NA 144   < > 144 145 144  K 3.9   < > 4.3 4.3 4.4  CL 102   < > 100 101 100  CO2 32   < > 35* 38* 38*  GLUCOSE 188*   < > 138* 141* 114*  BUN 56*   < > 48* 43* 46*  CREATININE 1.47*   < > 0.91 0.99 1.21*  CALCIUM 8.8*   < > 9.3 9.1 8.9  MG 1.9  --  1.7  --   --    < > = values in this interval not displayed.   Recent Labs    01/03/22 1249  AST 19  ALT 14  ALKPHOS 69  BILITOT 0.8  PROT 6.2*  ALBUMIN 2.6*   Recent Labs    12/21/21 0608 12/24/21 0445 01/03/22 1249  WBC 7.7 10.4 8.6  NEUTROABS  --   --  7.0  HGB 10.6* 10.6* 10.9*  HCT 33.5* 32.7* 36.7  MCV 91.8 91.1 97.1  PLT 204 183 409*   No results found for: "TSH" Lab Results  Component Value Date   HGBA1C 7.3 (H) 12/20/2021   Lab Results  Component Value Date   CHOL 188 12/20/2021   HDL 66 12/20/2021   LDLCALC 106 (H) 12/20/2021   TRIG 78 12/20/2021   CHOLHDL 2.8 12/20/2021    Significant Diagnostic Results in last 30  days:  CT CHEST WO CONTRAST  Result Date: 01/05/2022 CLINICAL DATA:  Follow-up abnormal lung nodule on x-ray. EXAM: CT CHEST WITHOUT CONTRAST TECHNIQUE: Multidetector  CT imaging of the chest was performed following the standard protocol without IV contrast. RADIATION DOSE REDUCTION: This exam was performed according to the departmental dose-optimization program which includes automated exposure control, adjustment of the mA and/or kV according to patient size and/or use of iterative reconstruction technique. COMPARISON:  None Available. FINDINGS: Cardiovascular: Marked severity calcification of the thoracic aorta is seen, without evidence of aortic aneurysm. There is mild cardiomegaly with marked severity coronary artery calcification. No pericardial effusion. Mediastinum/Nodes: Numerous subcentimeter AP window and pretracheal lymph nodes are seen. A 2.0 cm x 1.3 cm nodule versus cyst is seen within the right lobe of the thyroid gland. Occluded terminal bronchioles are suspected within the bilateral lower lobes. The trachea and esophagus demonstrate no significant findings. Lungs/Pleura: A 16 mm x 8 mm lobulated noncalcified lung nodule is seen within the posterolateral aspect of the left upper lobe. Mild left lower lobe and moderate severity right lower lobe compressive atelectasis is seen. Mild posteromedial right upper lobe atelectatic changes are also noted. Small to moderate size bilateral pleural effusions are seen. No pneumothorax is identified. Upper Abdomen: No acute abnormality. Musculoskeletal: Chronic compression fracture deformities are seen involving the T10 and T12 vertebral bodies. Extensive areas of lucency are seen within the anterior aspects of numerous thoracic spine vertebral bodies. Multilevel degenerative changes are noted throughout the thoracic spine IMPRESSION: 1. 16 mm x 8 mm left upper lobe noncalcified lung nodule. Consider one of the following in 3 months for both low-risk and  high-risk individuals: (a) repeat chest CT, (b) follow-up PET-CT, or (c) tissue sampling. This recommendation follows the consensus statement: Guidelines for Management of Incidental Pulmonary Nodules Detected on CT Images: From the Fleischner Society 2017; Radiology 2017; 284:228-243. 2. Small to moderate size bilateral pleural effusions with bilateral lower lobe compressive atelectasis. 3. T10 and T12 chronic compression fracture deformities with large lucent areas seen within the anterior aspects of numerous thoracic spine vertebral bodies. Correlation with MRI is recommended. 4. 2.0 cm x 1.3 cm nodule versus cyst is seen within the right lobe of the thyroid gland. Further evaluation with nonemergent thyroid ultrasound is recommended. This follows ACR consensus guidelines: Managing Incidental Thyroid Nodules Detected on Imaging: White Paper of the ACR Incidental Thyroid Findings Committee. J Am Coll Radiol 2015; 12:143-150. Aortic Atherosclerosis (ICD10-I70.0). Electronically Signed   By: Virgina Norfolk M.D.   On: 01/05/2022 23:07   DG Chest 2 View  Result Date: 01/05/2022 CLINICAL DATA:  CHF.  Hypertension.  Diabetes. EXAM: CHEST - 2 VIEW COMPARISON:  January 03, 2022 FINDINGS: Stable cardiomegaly. The hila and mediastinum are unchanged. No pneumothorax. Bilateral pleural effusions with underlying opacities, increased. Nodular opacity in the lateral left mid lung does not definitely correlate with a specific rib, measuring 13 mm. No other acute abnormalities. IMPRESSION: 1. Increasing bilateral pleural effusions with underlying opacities, likely atelectasis. No overt edema. 2. There is a 13 mm nodule over the left lateral chest which does not definitely correlate with a specific rib and could represent a pulmonary nodule. Recommend a CT scan for further evaluation. 3. No other changes. Electronically Signed   By: Dorise Bullion III M.D.   On: 01/05/2022 08:38   DG Chest Portable 1 View  Result Date:  01/03/2022 CLINICAL DATA:  Provided history: CHF, leg swelling. EXAM: PORTABLE CHEST 1 VIEW COMPARISON:  Prior chest radiographs 12/20/2021 and earlier. FINDINGS: Patient rotation to the right. Cardiomegaly. Aortic atherosclerosis. Central pulmonary vascular congestion and interstitial edema. Hazy opacity at the right lung  base compatible with a layering pleural effusion. Trace left pleural effusion. Bibasilar atelectasis. No evidence of pneumothorax. Degenerative changes of the spine. IMPRESSION: Cardiomegaly with central pulmonary vascular congestion and interstitial edema. Layering right pleural effusion. Trace left pleural effusion. Bibasilar atelectasis. Aortic Atherosclerosis (ICD10-I70.0). Electronically Signed   By: Kellie Simmering D.O.   On: 01/03/2022 13:22    Assessment/Plan 1. Acute on chronic diastolic CHF (congestive heart failure) (HCC) Unable to fully assess patient, however, on hcart review patient's weight has increased from 188 to 193 in the last 48 hours. Due to CHF and CKD, concern for worsening symptoms of shortness of breath due to increase in intravascular volume. No increased oxygen requirement at this time. Patient complained to nursing of shortness of breath, however, able to speak in complete sentences. Will plan for an additional dose of torsemide 40 mg today. Follow up at next availability for provider and patient.     Family/ staff Communication: Plan discussed with nurse  Labs/tests ordered:    Tomasa Rand, MD, [facility]  [facility] [phone]

## 2022-01-27 ENCOUNTER — Non-Acute Institutional Stay (SKILLED_NURSING_FACILITY): Payer: Medicare Other | Admitting: Student

## 2022-01-27 ENCOUNTER — Encounter: Payer: Self-pay | Admitting: Student

## 2022-01-27 DIAGNOSIS — E118 Type 2 diabetes mellitus with unspecified complications: Secondary | ICD-10-CM | POA: Diagnosis not present

## 2022-01-27 DIAGNOSIS — E1122 Type 2 diabetes mellitus with diabetic chronic kidney disease: Secondary | ICD-10-CM

## 2022-01-27 DIAGNOSIS — L03116 Cellulitis of left lower limb: Secondary | ICD-10-CM

## 2022-01-27 DIAGNOSIS — I5032 Chronic diastolic (congestive) heart failure: Secondary | ICD-10-CM | POA: Diagnosis not present

## 2022-01-27 DIAGNOSIS — I739 Peripheral vascular disease, unspecified: Secondary | ICD-10-CM

## 2022-01-27 DIAGNOSIS — I4891 Unspecified atrial fibrillation: Secondary | ICD-10-CM

## 2022-01-27 DIAGNOSIS — N1831 Chronic kidney disease, stage 3a: Secondary | ICD-10-CM

## 2022-01-27 DIAGNOSIS — I10 Essential (primary) hypertension: Secondary | ICD-10-CM

## 2022-01-27 DIAGNOSIS — I4892 Unspecified atrial flutter: Secondary | ICD-10-CM

## 2022-01-27 NOTE — Progress Notes (Unsigned)
Location:  Other [facility]) Nursing Home Room Number: 530A Place of Service:  SNF [phone]) Provider:  Dr. Dewayne Shorter  Pcp, No  Patient Care Team: Pcp, No as PCP - General  Extended Emergency Contact Information Primary Emergency Contact: [place] Mobile Phone: [phone] Relation: Son Secondary Emergency Contact: [place] Mobile Phone: [phone] Relation: Son  Code Status:  DNR Goals of care: Advanced Directive information    01/04/2022    2:00 AM  Advanced Directives  Does Patient Have a Medical Advance Directive? Yes  Type of Advance Directive Living will;Healthcare Power of [place];Out of facility DNR (pink MOST or yellow form)  Does patient want to make changes to medical advance directive? No - Patient declined  Copy of [place] in Chart? No - copy requested     Chief Complaint  Patient presents with   Medical Management of Chronic Issues    Medical Management of Chronic Issues with Foot Exam. Discuss need for Ophthalmology, tdap,shingrix,pneumonia and Flu or postpone if patient refuses. NCIR Verified.     HPI:  Pt is a 88 y.o. female seen today for medical management of chronic diseases.  Patient with PMH of CHF, CKD 3b, chronic bilateral lower extremity edema with wounds, chronic oxygen supplementation seen for regulatory visit today.   Patient states she is feeling less "punk" today. She is not always interested in participating in therapy with PT. She does not understand why she isn't on Med A hallway. She does not want to be palliative care and wants to make sure she is getting aggressive treatment for her chronic conditions. Discussed the importance of consistent participation in therapy or she will not have more intense therapies. Discussed she is on the routine floor and declined palliative care. Patient was insistent that I read her the note from palliative care because she wants to know what they said-- request  observed.   Her breathing has improved, swelling has improved, but none of it is better. She has been told she is rude but does not think she is. She wonders if she can move off of the hallway.   Per nursing, patient has "fired" numerous staff members from her care. She has been difficult to work with and declines care.    Past Medical History:  Diagnosis Date   Atrial fibrillation ([place])    Coronary artery disease    Diabetes mellitus without complication ([place])    Hypertension    Meningioma ([place])    Left frontal lobe   Past Surgical History:  Procedure Laterality Date   APPENDECTOMY     CHOLECYSTECTOMY     MELANOMA REMOVAL Right     Allergies  Allergen Reactions   Alendronate Other (See Comments)    Cramping of the jaw   Iodinated Contrast Media Other (See Comments)    Brother has allergy and she wants not to receive it    Statins Other (See Comments)    Fatigue/muscle weakness Fatigue/muscle weakness Fatigue/muscle weakness    Outpatient Encounter Medications as of 01/27/2022  Medication Sig   acetaminophen (TYLENOL) 325 MG tablet Take 2 tablets (650 mg total) by mouth every 6 (six) hours as needed for mild pain or fever.   aspirin EC 81 MG tablet Take 1 tablet (81 mg total) by mouth daily. Swallow whole.   docusate sodium (COLACE) 100 MG capsule Take 100 mg by mouth 2 (two) times daily as needed. Per patient's request   glucose blood (ONETOUCH ULTRA) test strip USE AS DIRECTED FOUR  TIMES DAILY *INSURANCE MAX OF 3 TIMES DAILY*   insulin detemir (LEVEMIR) 100 UNIT/ML injection Inject 0.05 mLs (5 Units total) into the skin 2 (two) times daily.   Insulin Syringe-Needle U-100 (BD VEO INSULIN SYRINGE U/F) 31G X 15/64" 0.3 ML MISC USE TO INJECT TWICE A DAY (SIZING CHANGED DUE TO MRS Buck'S CHOICE)   LOSARTAN POTASSIUM PO Take 100 mg by mouth at bedtime.   metFORMIN (GLUCOPHAGE) 1000 MG tablet 1,000 mg 2 (two) times daily with a meal.    metoprolol tartrate (LOPRESSOR) 25 MG  tablet Take 1 tablet by mouth 2 (two) times daily.   saccharomyces boulardii (FLORASTOR) 250 MG capsule Take 250 mg by mouth daily.   torsemide (DEMADEX) 20 MG tablet Take 40 mg by mouth daily.   No facility-administered encounter medications on file as of 01/27/2022.    Review of Systems  Immunization History  Administered Date(s) Administered   Influenza, High Dose Seasonal PF 02/24/2019   Influenza-Unspecified 04/14/2017   Moderna Covid-19 Vaccine Bivalent Booster 43yr & up 10/08/2021   Moderna SARS-COV2 Booster Vaccination 09/27/2020   Moderna Sars-Covid-2 Vaccination 07/22/2019, 09/24/2019, 04/25/2020   Pneumococcal Polysaccharide-23 05/19/2014   Varicella 02/12/2017   Zoster Recombinat (Shingrix) 01/09/2018   Pertinent  Health Maintenance Due  Topic Date Due   FOOT EXAM  Never done   OPHTHALMOLOGY EXAM  Never done   INFLUENZA VACCINE  12/10/2021   HEMOGLOBIN A1C  06/22/2022   DEXA SCAN  Completed      01/04/2022   11:50 AM 01/04/2022   10:00 PM 01/05/2022    9:00 AM 01/05/2022    7:34 PM 01/06/2022   11:00 PM  Fall Risk  Patient Fall Risk Level High fall risk High fall risk High fall risk High fall risk High fall risk   Functional Status Survey:    Vitals:   01/27/22 0937  BP: (!) 159/63  Pulse: 80  Resp: 20  Temp: 97.7 F (36.5 C)  SpO2: 90%  Weight: 190 lb (86.2 kg)  Height: 5' 4.5" (1.638 m)   Body mass index is 32.11 kg/m. Physical Exam Constitutional:      Appearance: She is obese.  HENT:     Nose:     Comments: Nasal canula in place [facility]:     Rate and Rhythm: Normal rate.     Pulses: Normal pulses.     Heart sounds: Normal heart sounds.  Pulmonary:     Effort: Pulmonary effort is normal.     Breath sounds: Normal breath sounds.  Abdominal:     General: Bowel sounds are normal.     Palpations: Abdomen is soft.  Neurological:     Mental Status: She is alert and oriented to person, place, and time.     Comments: 1+ pitting  edema of shin, 2+ putting edema bilateral feet. Muscle wasting of calf muscles.   Psychiatric:     Comments: Patient yells at nursing staff to continue adjusting blinds in her room until they are perfectly even or it will "bother her all day."     Labs reviewed: Recent Labs    12/21/21 0608 12/22/21 0448 01/04/22 0522 01/05/22 0538 01/07/22 0540  NA 144   < > 144 145 144  K 3.9   < > 4.3 4.3 4.4  CL 102   < > 100 101 100  CO2 32   < > 35* 38* 38*  GLUCOSE 188*   < > 138* 141* 114*  BUN 56*   < >  48* 43* 46*  CREATININE 1.47*   < > 0.91 0.99 1.21*  CALCIUM 8.8*   < > 9.3 9.1 8.9  MG 1.9  --  1.7  --   --    < > = values in this interval not displayed.   Recent Labs    01/03/22 1249  AST 19  ALT 14  ALKPHOS 69  BILITOT 0.8  PROT 6.2*  ALBUMIN 2.6*   Recent Labs    12/21/21 0608 12/24/21 0445 01/03/22 1249  WBC 7.7 10.4 8.6  NEUTROABS  --   --  7.0  HGB 10.6* 10.6* 10.9*  HCT 33.5* 32.7* 36.7  MCV 91.8 91.1 97.1  PLT 204 183 409*   No results found for: "TSH" Lab Results  Component Value Date   HGBA1C 7.3 (H) 12/20/2021   Lab Results  Component Value Date   CHOL 188 12/20/2021   HDL 66 12/20/2021   LDLCALC 106 (H) 12/20/2021   TRIG 78 12/20/2021   CHOLHDL 2.8 12/20/2021    Significant Diagnostic Results in last 30 days:  CT CHEST WO CONTRAST  Result Date: 01/05/2022 CLINICAL DATA:  Follow-up abnormal lung nodule on x-ray. EXAM: CT CHEST WITHOUT CONTRAST TECHNIQUE: Multidetector CT imaging of the chest was performed following the standard protocol without IV contrast. RADIATION DOSE REDUCTION: This exam was performed according to the departmental dose-optimization program which includes automated exposure control, adjustment of the mA and/or kV according to patient size and/or use of iterative reconstruction technique. COMPARISON:  None Available. FINDINGS: Cardiovascular: Marked severity calcification of the thoracic aorta is seen, without evidence of  aortic aneurysm. There is mild cardiomegaly with marked severity coronary artery calcification. No pericardial effusion. Mediastinum/Nodes: Numerous subcentimeter AP window and pretracheal lymph nodes are seen. A 2.0 cm x 1.3 cm nodule versus cyst is seen within the right lobe of the thyroid gland. Occluded terminal bronchioles are suspected within the bilateral lower lobes. The trachea and esophagus demonstrate no significant findings. Lungs/Pleura: A 16 mm x 8 mm lobulated noncalcified lung nodule is seen within the posterolateral aspect of the left upper lobe. Mild left lower lobe and moderate severity right lower lobe compressive atelectasis is seen. Mild posteromedial right upper lobe atelectatic changes are also noted. Small to moderate size bilateral pleural effusions are seen. No pneumothorax is identified. Upper Abdomen: No acute abnormality. Musculoskeletal: Chronic compression fracture deformities are seen involving the T10 and T12 vertebral bodies. Extensive areas of lucency are seen within the anterior aspects of numerous thoracic spine vertebral bodies. Multilevel degenerative changes are noted throughout the thoracic spine IMPRESSION: 1. 16 mm x 8 mm left upper lobe noncalcified lung nodule. Consider one of the following in 3 months for both low-risk and high-risk individuals: (a) repeat chest CT, (b) follow-up PET-CT, or (c) tissue sampling. This recommendation follows the consensus statement: Guidelines for Management of Incidental Pulmonary Nodules Detected on CT Images: From the Fleischner Society 2017; Radiology 2017; 284:228-243. 2. Small to moderate size bilateral pleural effusions with bilateral lower lobe compressive atelectasis. 3. T10 and T12 chronic compression fracture deformities with large lucent areas seen within the anterior aspects of numerous thoracic spine vertebral bodies. Correlation with MRI is recommended. 4. 2.0 cm x 1.3 cm nodule versus cyst is seen within the right lobe of  the thyroid gland. Further evaluation with nonemergent thyroid ultrasound is recommended. This follows ACR consensus guidelines: Managing Incidental Thyroid Nodules Detected on Imaging: White Paper of the ACR Incidental Thyroid Findings Committee. J Am Coll Radiol  2015; 12:143-150. Aortic Atherosclerosis (ICD10-I70.0). Electronically Signed   By: Virgina Norfolk M.D.   On: 01/05/2022 23:07   DG Chest 2 View  Result Date: 01/05/2022 CLINICAL DATA:  CHF.  Hypertension.  Diabetes. EXAM: CHEST - 2 VIEW COMPARISON:  January 03, 2022 FINDINGS: Stable cardiomegaly. The hila and mediastinum are unchanged. No pneumothorax. Bilateral pleural effusions with underlying opacities, increased. Nodular opacity in the lateral left mid lung does not definitely correlate with a specific rib, measuring 13 mm. No other acute abnormalities. IMPRESSION: 1. Increasing bilateral pleural effusions with underlying opacities, likely atelectasis. No overt edema. 2. There is a 13 mm nodule over the left lateral chest which does not definitely correlate with a specific rib and could represent a pulmonary nodule. Recommend a CT scan for further evaluation. 3. No other changes. Electronically Signed   By: Dorise Bullion III M.D.   On: 01/05/2022 08:38   DG Chest Portable 1 View  Result Date: 01/03/2022 CLINICAL DATA:  Provided history: CHF, leg swelling. EXAM: PORTABLE CHEST 1 VIEW COMPARISON:  Prior chest radiographs 12/20/2021 and earlier. FINDINGS: Patient rotation to the right. Cardiomegaly. Aortic atherosclerosis. Central pulmonary vascular congestion and interstitial edema. Hazy opacity at the right lung base compatible with a layering pleural effusion. Trace left pleural effusion. Bibasilar atelectasis. No evidence of pneumothorax. Degenerative changes of the spine. IMPRESSION: Cardiomegaly with central pulmonary vascular congestion and interstitial edema. Layering right pleural effusion. Trace left pleural effusion. Bibasilar  atelectasis. Aortic Atherosclerosis (ICD10-I70.0). Electronically Signed   By: Kellie Simmering D.O.   On: 01/03/2022 13:22    Assessment/Plan 1. Chronic diastolic CHF (congestive heart failure) (HCC) Patient's weight has stabilized after receiving additional dose of torsemide and breathing is less labored. Will continue torsemide 40 mg daily. Discussed potential exercises that could be helpful and discussed IS. Will discuss with nursing incentive spirometer to help with respiratory therapy. Discussed importance of physical activity with PT. Patient is planning to get up with meals, encouraged because this is a level of exercise.   2. CKD stage G3a/A1, GFR 45-59 and albumin creatinine ratio <30 mg/g [facility]) Patient is followed by nephrology. Recently seen on 9/8. Will plan to fax nephrologist lab results. Follow up in 3 months.   3. Controlled type 2 diabetes mellitus with complication, without long-term current use of insulin (HCC) Fasted glucose at goal between 100-135. Half of  post-prandial glucoses >250. Patient is followed by endocrinology. Will defer changes to specialist per patient preference.   4. Essential (primary) hypertension BP generally well-controlled on current regimen with losartan 100 mg daily.   5. Atrial fibrillation and flutter (HCC) HR well-controled with lopressor 25 mg BID  6. PAD (peripheral artery disease) ([place]) Patient has persistent lower extremity pain. Continue scheduled tylenol 650 BID per patient preference.  7. Cellulitis of left lower extremity Wounds no longer appear infected, however, still healing. Will continue routine wound care.   8. Type 2 diabetes mellitus with diabetic chronic kidney disease, unspecified CKD stage, unspecified whether long term insulin use ([place]) CKD and DM outlined above. Will continue discussions regarding tighter postprandial glucose control.    Family/ staff Communication: Communicated with physical therapy to determine update for  care plan for the patient. Plan to communicate with nephrology their requested lab results in hopes of adequate co-management. Nursing staff and administration notified of patient's requests.   Labs/tests ordered:  none  Tomasa Rand, MD, [facility] [phone]

## 2022-01-31 ENCOUNTER — Encounter: Payer: Self-pay | Admitting: Student

## 2022-01-31 ENCOUNTER — Non-Acute Institutional Stay (SKILLED_NURSING_FACILITY): Payer: Medicare Other | Admitting: Student

## 2022-01-31 DIAGNOSIS — N1831 Chronic kidney disease, stage 3a: Secondary | ICD-10-CM

## 2022-01-31 DIAGNOSIS — Z794 Long term (current) use of insulin: Secondary | ICD-10-CM

## 2022-01-31 DIAGNOSIS — E1121 Type 2 diabetes mellitus with diabetic nephropathy: Secondary | ICD-10-CM

## 2022-01-31 DIAGNOSIS — I10 Essential (primary) hypertension: Secondary | ICD-10-CM

## 2022-01-31 DIAGNOSIS — I5032 Chronic diastolic (congestive) heart failure: Secondary | ICD-10-CM | POA: Diagnosis not present

## 2022-01-31 DIAGNOSIS — B370 Candidal stomatitis: Secondary | ICD-10-CM

## 2022-01-31 DIAGNOSIS — I4891 Unspecified atrial fibrillation: Secondary | ICD-10-CM | POA: Diagnosis not present

## 2022-01-31 DIAGNOSIS — I4892 Unspecified atrial flutter: Secondary | ICD-10-CM

## 2022-01-31 DIAGNOSIS — R410 Disorientation, unspecified: Secondary | ICD-10-CM

## 2022-01-31 NOTE — Progress Notes (Signed)
Location:   [place] Room Number: 314 Place of Service:  SNF [phone]) Provider:  Dewayne Shorter, MD  Pcp, No  Patient Care Team: Pcp, No as PCP - General  Extended Emergency Contact Information Primary Emergency Contact: [place] Mobile Phone: [phone] Relation: Son Secondary Emergency Contact: [place] Mobile Phone: [phone] Relation: Son  Code Status:  DNR Goals of care: Advanced Directive information    01/31/2022   10:39 AM  Advanced Directives  Does Patient Have a Medical Advance Directive? Yes  Type of Advance Directive Out of facility DNR (pink MOST or yellow form)     Chief Complaint  Patient presents with   Acute Visit    Lethargy    HPI:  Pt is a 88 y.o. female seen today for an acute visit for lethargy. Patient has declined eating meals for the last 3 meals.  Nursing requested evaluation given decreased p.o. intake and not as communicative as previously.  Upon entering room patient keeps her eyes closed throughout conversation.  When asked open-ended questions there was no response.  When asked directed questions such as her name, date of birth, children's names she was able to state them clearly.  When asked where we were currently she said maybe [place], asked could be that were at [facility] and she said yes on a live [place].  Discussed with patient concerned that a significant change in verbal communication is concerning for stroke.  Patient says "no stroke, no hospital".  Asked patient to repeat after me today's date is January 31, 2022 and she was able to do so clearly.  Also asked her to repeat after me "but Jumped over the moon every Friday" she was able to do so without issue.  She is also able to do three-step orders.  Given patient's change in interaction with staff called her son Clare Gandy who is her first emergency contact and power of attorney.  When the above was discussed, he states "she  sounds resigned to die."  He says that he has not talked to her since Wednesday and at that time she was saying that no one was giving medical care for her oral lesions.  Discussed with son that our nurse practitioner started on medication and patient declined until I evaluated however I was unable to evaluate until today.  In my opinion there is no clear infection at this time, however we will continue mouthwash if patient will allow.  Discussed with him concern that she has not been eating he states he and his brother would like to respect her desire to die in her time and would not like to transferred to the hospital if she does not like to either. "I understand she is on her last legs. Especially the heart. I hoped she would be nicer in the end" She has been moved around a lot so  he isn't as worried about her orientation. He wonders if it is intentionally incorrect because she intentionally wouldn't say where her son worked for 15 year even when she knew. Be informed that she will not hear or listen if she doesn't want to.  Clare Gandy and his brother would like to discuss with his brother and will communicate with nursing staff if there is a change.   Past Medical History:  Diagnosis Date   Atrial fibrillation [facility])    Coronary artery disease    Diabetes mellitus without complication ([place])    Hypertension    Meningioma ([place])  Left frontal lobe   Past Surgical History:  Procedure Laterality Date   APPENDECTOMY     CHOLECYSTECTOMY     MELANOMA REMOVAL Right     Allergies  Allergen Reactions   Alendronate Other (See Comments)    Cramping of the jaw   Iodinated Contrast Media Other (See Comments)    Brother has allergy and she wants not to receive it    Statins Other (See Comments)    Fatigue/muscle weakness Fatigue/muscle weakness Fatigue/muscle weakness    Allergies as of 01/31/2022       Reactions   Alendronate Other (See Comments)   Cramping of the jaw   Iodinated Contrast Media  Other (See Comments)   Brother has allergy and she wants not to receive it    Statins Other (See Comments)   Fatigue/muscle weakness Fatigue/muscle weakness Fatigue/muscle weakness        Medication List        Accurate as of January 31, 2022  1:31 PM. If you have any questions, ask your nurse or doctor.          acetaminophen 325 MG tablet Commonly known as: TYLENOL Take 2 tablets (650 mg total) by mouth every 6 (six) hours as needed for mild pain or fever.   aspirin EC 81 MG tablet Take 1 tablet (81 mg total) by mouth daily. Swallow whole.   BD Veo Insulin Syringe U/F 31G X 15/64" 0.3 ML Misc Generic drug: Insulin Syringe-Needle U-100 USE TO INJECT TWICE A DAY (SIZING CHANGED DUE TO MRS Crespin'S CHOICE)   docusate sodium 100 MG capsule Commonly known as: COLACE Take 100 mg by mouth 2 (two) times daily as needed. Per patient's request   insulin detemir 100 UNIT/ML injection Commonly known as: Levemir Inject 0.05 mLs (5 Units total) into the skin 2 (two) times daily.   LOSARTAN POTASSIUM PO Take 100 mg by mouth at bedtime.   metFORMIN 1000 MG tablet Commonly known as: GLUCOPHAGE 1,000 mg 2 (two) times daily with a meal.   metoprolol tartrate 25 MG tablet Commonly known as: LOPRESSOR Take 1 tablet by mouth 2 (two) times daily.   OneTouch Ultra test strip Generic drug: glucose blood USE AS DIRECTED FOUR TIMES DAILY *INSURANCE MAX OF 3 TIMES DAILY*   saccharomyces boulardii 250 MG capsule Commonly known as: FLORASTOR Take 250 mg by mouth daily.   torsemide 20 MG tablet Commonly known as: DEMADEX Take 40 mg by mouth daily.        Review of Systems  Constitutional:  Positive for activity change and appetite change.  Respiratory:          "No change in breathing"  Cardiovascular:  Positive for leg swelling. Negative for chest pain.  Psychiatric/Behavioral:         "I had a dream I died a couple of days ago" "It was strange"  All other systems  reviewed and are negative.  Immunization History  Administered Date(s) Administered   Influenza, High Dose Seasonal PF 02/24/2019   Influenza-Unspecified 04/14/2017   Moderna Covid-19 Vaccine Bivalent Booster 42yr & up 10/08/2021   Moderna SARS-COV2 Booster Vaccination 09/27/2020   Moderna Sars-Covid-2 Vaccination 07/22/2019, 09/24/2019, 04/25/2020   Pneumococcal Polysaccharide-23 05/19/2014   Varicella 02/12/2017   Zoster Recombinat (Shingrix) 01/09/2018   Pertinent  Health Maintenance Due  Topic Date Due   FOOT EXAM  Never done   OPHTHALMOLOGY EXAM  Never done   INFLUENZA VACCINE  12/10/2021   HEMOGLOBIN A1C  06/22/2022  DEXA SCAN  Completed      01/04/2022   11:50 AM 01/04/2022   10:00 PM 01/05/2022    9:00 AM 01/05/2022    7:34 PM 01/06/2022   11:00 PM  Fall Risk  Patient Fall Risk Level High fall risk High fall risk High fall risk High fall risk High fall risk   Functional Status Survey:    Vitals:   01/31/22 1032  BP: 117/64  Pulse: 73  Resp: 20  Temp: 97.7 F (36.5 C)  SpO2: 94%  Weight: 198 lb 3.2 oz (89.9 kg)  Height: 5' 4.5" (1.638 m)   Body mass index is 33.5 kg/m. Physical Exam Constitutional:      Comments: Awake, keeps eyes closed, one word answers  HENT:     Head: Normocephalic and atraumatic.     Nose: Nose normal.     Mouth/Throat:     Mouth: Mucous membranes are moist.  Eyes:     Conjunctiva/sclera: Conjunctivae normal.     Pupils: Pupils are equal, round, and reactive to light.  Cardiovascular:     Rate and Rhythm: Normal rate.     Pulses: Normal pulses.  Pulmonary:     Effort: Pulmonary effort is normal.     Comments: Anterior lung fields clear bilaterally Musculoskeletal:     Comments: Patient with chronic 2+ edema of bilateral feet an legs to shins  Neurological:     Cranial Nerves: No cranial nerve deficit.     Comments: Patient states her full name and date of birth. She isn't sure of the day of the week or her exact location.  She states she is from [place] and has lived at [place]. Face symmetrical. Tongue protrusion symmetrical. Strength 4/5 bilaterall upper extremities. Able to wiggle both toes well. When asked to lift legs off of the bed for strength testing, patient shakes her head no   Labs reviewed: Recent Labs    12/21/21 0608 12/22/21 0448 01/04/22 0522 01/05/22 0538 01/07/22 0540 01/14/22 0000  NA 144   < > 144 145 144 141  K 3.9   < > 4.3 4.3 4.4 4.6  CL 102   < > 100 101 100 98*  CO2 32   < > 35* 38* 38* 38*  GLUCOSE 188*   < > 138* 141* 114*  --   BUN 56*   < > 48* 43* 46* 38*  CREATININE 1.47*   < > 0.91 0.99 1.21* 1.0  CALCIUM 8.8*   < > 9.3 9.1 8.9 8.9  MG 1.9  --  1.7  --   --   --    < > = values in this interval not displayed.   Recent Labs    01/03/22 1249  AST 19  ALT 14  ALKPHOS 69  BILITOT 0.8  PROT 6.2*  ALBUMIN 2.6*   Recent Labs    12/21/21 0608 12/24/21 0445 01/02/22 0000 01/03/22 1249  WBC 7.7 10.4 8.0 8.6  NEUTROABS  --   --  6,264.00 7.0  HGB 10.6* 10.6* 10.8* 10.9*  HCT 33.5* 32.7* 35* 36.7  MCV 91.8 91.1  --  97.1  PLT 204 183 380 409*   No results found for: "TSH" Lab Results  Component Value Date   HGBA1C 7.3 (H) 12/20/2021   Lab Results  Component Value Date   CHOL 188 12/20/2021   HDL 66 12/20/2021   LDLCALC 106 (H) 12/20/2021   TRIG 78 12/20/2021   CHOLHDL 2.8 12/20/2021  Significant Diagnostic Results in last 30 days:  CT CHEST WO CONTRAST  Result Date: 01/05/2022 CLINICAL DATA:  Follow-up abnormal lung nodule on x-ray. EXAM: CT CHEST WITHOUT CONTRAST TECHNIQUE: Multidetector CT imaging of the chest was performed following the standard protocol without IV contrast. RADIATION DOSE REDUCTION: This exam was performed according to the departmental dose-optimization program which includes automated exposure control, adjustment of the mA and/or kV according to patient size and/or use of iterative reconstruction technique. COMPARISON:  None  Available. FINDINGS: Cardiovascular: Marked severity calcification of the thoracic aorta is seen, without evidence of aortic aneurysm. There is mild cardiomegaly with marked severity coronary artery calcification. No pericardial effusion. Mediastinum/Nodes: Numerous subcentimeter AP window and pretracheal lymph nodes are seen. A 2.0 cm x 1.3 cm nodule versus cyst is seen within the right lobe of the thyroid gland. Occluded terminal bronchioles are suspected within the bilateral lower lobes. The trachea and esophagus demonstrate no significant findings. Lungs/Pleura: A 16 mm x 8 mm lobulated noncalcified lung nodule is seen within the posterolateral aspect of the left upper lobe. Mild left lower lobe and moderate severity right lower lobe compressive atelectasis is seen. Mild posteromedial right upper lobe atelectatic changes are also noted. Small to moderate size bilateral pleural effusions are seen. No pneumothorax is identified. Upper Abdomen: No acute abnormality. Musculoskeletal: Chronic compression fracture deformities are seen involving the T10 and T12 vertebral bodies. Extensive areas of lucency are seen within the anterior aspects of numerous thoracic spine vertebral bodies. Multilevel degenerative changes are noted throughout the thoracic spine IMPRESSION: 1. 16 mm x 8 mm left upper lobe noncalcified lung nodule. Consider one of the following in 3 months for both low-risk and high-risk individuals: (a) repeat chest CT, (b) follow-up PET-CT, or (c) tissue sampling. This recommendation follows the consensus statement: Guidelines for Management of Incidental Pulmonary Nodules Detected on CT Images: From the Fleischner Society 2017; Radiology 2017; 284:228-243. 2. Small to moderate size bilateral pleural effusions with bilateral lower lobe compressive atelectasis. 3. T10 and T12 chronic compression fracture deformities with large lucent areas seen within the anterior aspects of numerous thoracic spine vertebral  bodies. Correlation with MRI is recommended. 4. 2.0 cm x 1.3 cm nodule versus cyst is seen within the right lobe of the thyroid gland. Further evaluation with nonemergent thyroid ultrasound is recommended. This follows ACR consensus guidelines: Managing Incidental Thyroid Nodules Detected on Imaging: White Paper of the ACR Incidental Thyroid Findings Committee. J Am Coll Radiol 2015; 12:143-150. Aortic Atherosclerosis (ICD10-I70.0). Electronically Signed   By: Virgina Norfolk M.D.   On: 01/05/2022 23:07   DG Chest 2 View  Result Date: 01/05/2022 CLINICAL DATA:  CHF.  Hypertension.  Diabetes. EXAM: CHEST - 2 VIEW COMPARISON:  January 03, 2022 FINDINGS: Stable cardiomegaly. The hila and mediastinum are unchanged. No pneumothorax. Bilateral pleural effusions with underlying opacities, increased. Nodular opacity in the lateral left mid lung does not definitely correlate with a specific rib, measuring 13 mm. No other acute abnormalities. IMPRESSION: 1. Increasing bilateral pleural effusions with underlying opacities, likely atelectasis. No overt edema. 2. There is a 13 mm nodule over the left lateral chest which does not definitely correlate with a specific rib and could represent a pulmonary nodule. Recommend a CT scan for further evaluation. 3. No other changes. Electronically Signed   By: Dorise Bullion III M.D.   On: 01/05/2022 08:38   DG Chest Portable 1 View  Result Date: 01/03/2022 CLINICAL DATA:  Provided history: CHF, leg swelling. EXAM: PORTABLE CHEST  1 VIEW COMPARISON:  Prior chest radiographs 12/20/2021 and earlier. FINDINGS: Patient rotation to the right. Cardiomegaly. Aortic atherosclerosis. Central pulmonary vascular congestion and interstitial edema. Hazy opacity at the right lung base compatible with a layering pleural effusion. Trace left pleural effusion. Bibasilar atelectasis. No evidence of pneumothorax. Degenerative changes of the spine. IMPRESSION: Cardiomegaly with central pulmonary  vascular congestion and interstitial edema. Layering right pleural effusion. Trace left pleural effusion. Bibasilar atelectasis. Aortic Atherosclerosis (ICD10-I70.0). Electronically Signed   By: Kellie Simmering D.O.   On: 01/03/2022 13:22    Assessment/Plan. 1. Chronic diastolic CHF (congestive heart failure) ([place])  Disorientation Patient has notable weight gain of roughly 8 pounds over the course of the week.  We will plan for torsemide 40 mg twice daily for the next 3 days and reweigh patient on Monday.  Patient often declines daily weights.  Patient does not have an increased oxygen requirement, leg swelling is stable, in no acute distress at this time.  Patient does appear lethargic at this time and given concern for change in verbal communication discussed with patient I worry she had a stroke.  In response to that she states "no stroke" and she did not want to go to the hospital.  Discussed concerns with her son who also states he wants to respect her wishes so at this time we will plan to manage patient's current weight gain and changes in energy level here at the skilled nursing facility.  Will collect electrolytes, renal function panel, liver function tests, CBC to evaluate for any underlying abnormalities.  2. CKD stage G3a/A1, GFR 45-59 and albumin creatinine ratio <30 mg/g (HCC) Labs as outlined above  3. Type 2 diabetes mellitus with diabetic nephropathy, with long-term current use of insulin ([place]) Patient has refused insulin while she is not eating. Will continue to monitor glucose. Fasted glucose today was 188.   4. Atrial fibrillation and flutter (HCC) HR within normal range at this time. Continue metoprolol 25 mg twice daily.  5. Essential (primary) hypertension Blood pressure Well controlled on current regimen we will continue losartan 50 mg daily.  6. Thrush Patient was diagnosed with thrush earlier this week and started on Magic mouthwash.  Patient's behavioral changes started  after starting Magic mouthwash which includes Benadryl some concern this could be a side effect of the new medication.  We will discontinue Magic mouthwash and start a GI cocktail as follows: Viscous lidocaine 2% 39m, Maalox 388m and nystatin Oral suspension: 400,000-600,000 units PO together to swish and swallow TID  Family/ staff Communication: Clinical coordinator notified, discussed current state and care plan with Son, TeClare Gandyor 15 minutes regarding decision to keep patient here at the facility instead of transfer to the hospital.   Labs/tests ordered:   CBC, CMSan DiegoMD, [id number]

## 2022-02-03 ENCOUNTER — Non-Acute Institutional Stay (SKILLED_NURSING_FACILITY): Payer: Medicare Other | Admitting: Student

## 2022-02-03 DIAGNOSIS — I5032 Chronic diastolic (congestive) heart failure: Secondary | ICD-10-CM | POA: Diagnosis not present

## 2022-02-03 NOTE — Progress Notes (Unsigned)
Location:  [facility] Room Number: 433 IRJJO of Service:  SNF (31) Provider:  Dewayne Shorter  Pcp, No  Patient Care Team: Pcp, No as PCP - General  Extended Emergency Contact Information Primary Emergency Contact: [place] Mobile Phone: [phone] Relation: Son Secondary Emergency Contact: [place] Mobile Phone: [phone] Relation: Son  Code Status:  DNAR Goals of care: Advanced Directive information    01/31/2022   10:39 AM  Advanced Directives  Does Patient Have a Medical Advance Directive? Yes  Type of Advance Directive Out of facility DNR (pink MOST or yellow form)     Chief Complaint  Patient presents with   Labs Only    HPI:  Pt is a 88 y.o. female seen today for an acute visit for follow up of labs. Patient states she is feeling much better since discontinuing magic mouthwash. She says she is very sensitive to medications. She is breathing well. Pain has improved in the legs. She is eating better. Urinating and having regular bowel movements.    Past Medical History:  Diagnosis Date   Atrial fibrillation ([place])    Coronary artery disease    Diabetes mellitus without complication ([place])    Hypertension    Meningioma ([place])    Left frontal lobe   Past Surgical History:  Procedure Laterality Date   APPENDECTOMY     CHOLECYSTECTOMY     MELANOMA REMOVAL Right     Allergies  Allergen Reactions   Alendronate Other (See Comments)    Cramping of the jaw   Iodinated Contrast Media Other (See Comments)    Brother has allergy and she wants not to receive it    Statins Other (See Comments)    Fatigue/muscle weakness Fatigue/muscle weakness Fatigue/muscle weakness    Outpatient Encounter Medications as of 02/03/2022  Medication Sig   acetaminophen (TYLENOL) 325 MG tablet Take 2 tablets (650 mg total) by mouth every 6 (six) hours as needed for mild pain or fever.   aspirin EC 81 MG tablet Take 1 tablet (81 mg total) by mouth  daily. Swallow whole.   docusate sodium (COLACE) 100 MG capsule Take 100 mg by mouth 2 (two) times daily as needed. Per patient's request   glucose blood (ONETOUCH ULTRA) test strip USE AS DIRECTED FOUR TIMES DAILY *INSURANCE MAX OF 3 TIMES DAILY*   insulin detemir (LEVEMIR) 100 UNIT/ML injection Inject 0.05 mLs (5 Units total) into the skin 2 (two) times daily.   Insulin Syringe-Needle U-100 (BD VEO INSULIN SYRINGE U/F) 31G X 15/64" 0.3 ML MISC USE TO INJECT TWICE A DAY (SIZING CHANGED DUE TO MRS Pisani'S CHOICE)   LOSARTAN POTASSIUM PO Take 100 mg by mouth at bedtime.   metFORMIN (GLUCOPHAGE) 1000 MG tablet 1,000 mg 2 (two) times daily with a meal.    metoprolol tartrate (LOPRESSOR) 25 MG tablet Take 1 tablet by mouth 2 (two) times daily.   saccharomyces boulardii (FLORASTOR) 250 MG capsule Take 250 mg by mouth daily.   torsemide (DEMADEX) 20 MG tablet Take 40 mg by mouth daily.   No facility-administered encounter medications on file as of 02/03/2022.    Review of Systems  All other systems reviewed and are negative.  Immunization History  Administered Date(s) Administered   Influenza, High Dose Seasonal PF 02/24/2019   Influenza-Unspecified 04/14/2017   Moderna Covid-19 Vaccine Bivalent Booster 22yr & up 10/08/2021   Moderna SARS-COV2 Booster Vaccination 09/27/2020   Moderna Sars-Covid-2 Vaccination 07/22/2019, 09/24/2019, 04/25/2020   Pneumococcal Polysaccharide-23 05/19/2014   Varicella 02/12/2017  Zoster Recombinat (Shingrix) 01/09/2018   Pertinent  Health Maintenance Due  Topic Date Due   FOOT EXAM  Never done   OPHTHALMOLOGY EXAM  Never done   INFLUENZA VACCINE  12/10/2021   HEMOGLOBIN A1C  06/22/2022   DEXA SCAN  Completed      01/04/2022   11:50 AM 01/04/2022   10:00 PM 01/05/2022    9:00 AM 01/05/2022    7:34 PM 01/06/2022   11:00 PM  Fall Risk  Patient Fall Risk Level High fall risk High fall risk High fall risk High fall risk High fall risk   Functional Status  Survey:    Vitals:   02/05/22 1629  BP: 134/75  Pulse: 89  Temp: 97.7 F (36.5 C)  SpO2: 95%  Weight: 196 lb (88.9 kg)  Height: '4\' 11"'$  (1.499 m)   Body mass index is 39.59 kg/m. Physical Exam Constitutional:      Appearance: She is obese.  Cardiovascular:     Pulses: Normal pulses.     Heart sounds: Normal heart sounds.  Pulmonary:     Effort: Pulmonary effort is normal.     Breath sounds: Normal breath sounds.  Abdominal:     General: Bowel sounds are normal.     Palpations: Abdomen is soft.  Musculoskeletal:     Comments: 2+  pitting edema bilateral legs and feet to the knee   Neurological:     Mental Status: She is alert and oriented to person, place, and time.   Labs reviewed: Recent Labs    12/21/21 0608 12/22/21 0448 01/04/22 0522 01/05/22 0538 01/07/22 0540 01/14/22 0000  NA 144   < > 144 145 144 141  K 3.9   < > 4.3 4.3 4.4 4.6  CL 102   < > 100 101 100 98*  CO2 32   < > 35* 38* 38* 38*  GLUCOSE 188*   < > 138* 141* 114*  --   BUN 56*   < > 48* 43* 46* 38*  CREATININE 1.47*   < > 0.91 0.99 1.21* 1.0  CALCIUM 8.8*   < > 9.3 9.1 8.9 8.9  MG 1.9  --  1.7  --   --   --    < > = values in this interval not displayed.   Recent Labs    01/03/22 1249  AST 19  ALT 14  ALKPHOS 69  BILITOT 0.8  PROT 6.2*  ALBUMIN 2.6*   Recent Labs    12/21/21 0608 12/24/21 0445 01/02/22 0000 01/03/22 1249  WBC 7.7 10.4 8.0 8.6  NEUTROABS  --   --  6,264.00 7.0  HGB 10.6* 10.6* 10.8* 10.9*  HCT 33.5* 32.7* 35* 36.7  MCV 91.8 91.1  --  97.1  PLT 204 183 380 409*   No results found for: "TSH" Lab Results  Component Value Date   HGBA1C 7.3 (H) 12/20/2021   Lab Results  Component Value Date   CHOL 188 12/20/2021   HDL 66 12/20/2021   LDLCALC 106 (H) 12/20/2021   TRIG 78 12/20/2021   CHOLHDL 2.8 12/20/2021    Significant Diagnostic Results in last 30 days:  No results found.  Assessment/Plan 1. Chronic diastolic CHF (congestive heart failure)  ([place]) Patient had labs collected on Friday which showed stable Cr to 9/6. Discussed plan of care with patient. Will continue to monitor for weights. Will consider regular BID dosing of torsemide if patient continues having weekly fluctuations. Responded appropriately to the weekend of  BID dosing. Will continue to monitor for symptoms. Continue Torsemide '40mg'$  daily.    Family/ staff Communication: nursing  Labs/tests ordered:  none  Tomasa Rand, MD, [place] [phone]

## 2022-02-05 ENCOUNTER — Encounter: Payer: Self-pay | Admitting: Student

## 2022-03-02 ENCOUNTER — Telehealth: Payer: Self-pay | Admitting: Orthopedic Surgery

## 2022-03-02 NOTE — Telephone Encounter (Signed)
CXR results note bilateral pleural effusion. Torsemide increased to 40 mg po BID x 2 days. Nurse reports they unable to draw BMP and BNP today due to lack of tubing. Plan to draw tomorrow. O2 sat > 86% on 3 liters. Advised to increase O2 to 4 liters, goal sats > 88%. Advised to contact on call provider if change in condition.

## 2022-03-03 ENCOUNTER — Encounter: Payer: Self-pay | Admitting: Nurse Practitioner

## 2022-03-03 ENCOUNTER — Non-Acute Institutional Stay (SKILLED_NURSING_FACILITY): Payer: Medicare Other | Admitting: Nurse Practitioner

## 2022-03-03 DIAGNOSIS — I5033 Acute on chronic diastolic (congestive) heart failure: Secondary | ICD-10-CM

## 2022-03-03 DIAGNOSIS — N1831 Chronic kidney disease, stage 3a: Secondary | ICD-10-CM | POA: Diagnosis not present

## 2022-03-03 LAB — BASIC METABOLIC PANEL
BUN: 27 — AB (ref 4–21)
Chloride: 90 — AB (ref 99–108)
Creatinine: 1 (ref 0.5–1.1)
Glucose: 150
Potassium: 4 mEq/L (ref 3.5–5.1)
Sodium: 142 (ref 137–147)

## 2022-03-03 LAB — COMPREHENSIVE METABOLIC PANEL
Calcium: 8.8 (ref 8.7–10.7)
eGFR: 53

## 2022-03-03 NOTE — Progress Notes (Signed)
Location:  [facility] Room Number: [facility] 520A Place of Service:  SNF (31)[facility] 520A  Pcp, No  Patient Care Team: Pcp, No as PCP - General  Extended Emergency Contact Information Primary Emergency Contact: [place] Mobile Phone: [phone] Relation: Son Secondary Emergency Contact: [place] Mobile Phone: [phone] Relation: Son  Goals of care: Advanced Directive information    01/31/2022   10:39 AM  Advanced Directives  Does Patient Have a Medical Advance Directive? Yes  Type of Advance Directive Out of facility DNR (pink MOST or yellow form)     Chief Complaint  Patient presents with   Acute Visit    Follow up LE edema and chest xray     HPI:  Pt is a 88 y.o. female seen today for an acute visit for follow up. Pt with hx of  aifb, CAD, CHF, CKD. Staff reported worsening Le edema with increase O2 needs. Currently O2 at 92% on 3L [place].  Orders given to increase torsemide for 2 days. On exam today she was laying in bed with increase in RR without significant increase in work of breathing. weight is down today but continues to have shortness of breath without activity and LE edema. Denies chest pains. Reports she wants to follow up with her [place] doctors.    Past Medical History:  Diagnosis Date   Atrial fibrillation ([place])    Coronary artery disease    Diabetes mellitus without complication ([place])    Hypertension    Meningioma ([place])    Left frontal lobe   Past Surgical History:  Procedure Laterality Date   APPENDECTOMY     CHOLECYSTECTOMY     MELANOMA REMOVAL Right     Allergies  Allergen Reactions   Alendronate Other (See Comments)    Cramping of the jaw   Iodinated Contrast Media Other (See Comments)    Brother has allergy and she wants not to receive it    Statins Other (See Comments)    Fatigue/muscle weakness Fatigue/muscle weakness Fatigue/muscle weakness    Outpatient Encounter Medications as  of 03/03/2022  Medication Sig   acetaminophen (TYLENOL) 325 MG tablet Take 2 tablets (650 mg total) by mouth every 6 (six) hours as needed for mild pain or fever.   aspirin EC 81 MG tablet Take 1 tablet (81 mg total) by mouth daily. Swallow whole. (Patient taking differently: Take 81 mg by mouth 2 (two) times daily. Swallow whole.)   docusate sodium (COLACE) 100 MG capsule Take Two Capsules by mouth every 24 hours as needed.   glucose blood (ONETOUCH ULTRA) test strip USE AS DIRECTED FOUR TIMES DAILY *INSURANCE MAX OF 3 TIMES DAILY*   insulin detemir (LEVEMIR) 100 UNIT/ML injection Inject 0.05 mLs (5 Units total) into the skin 2 (two) times daily.   Insulin Syringe-Needle U-100 (BD VEO INSULIN SYRINGE U/F) 31G X 15/64" 0.3 ML MISC USE TO INJECT TWICE A DAY (SIZING CHANGED DUE TO MRS Hirschmann'S CHOICE)   LOSARTAN POTASSIUM PO Take 100 mg by mouth at bedtime.   metFORMIN (GLUCOPHAGE) 1000 MG tablet 1,000 mg 2 (two) times daily with a meal.    metoprolol tartrate (LOPRESSOR) 25 MG tablet Take 1 tablet by mouth 2 (two) times daily.   saccharomyces boulardii (FLORASTOR) 250 MG capsule Take 250 mg by mouth daily.   torsemide (DEMADEX) 20 MG tablet Take 40 mg by mouth 2 (two) times daily.   No facility-administered encounter medications on file as of 03/03/2022.    Review of  Systems  Constitutional:  Negative for chills and fever.  Respiratory:  Positive for shortness of breath. Negative for cough.   Cardiovascular:  Positive for leg swelling. Negative for chest pain and palpitations.  Gastrointestinal:  Negative for abdominal pain, constipation and diarrhea.  Musculoskeletal:  Negative for back pain and myalgias.  Skin: Negative.   Neurological:  Negative for dizziness and headaches.    Immunization History  Administered Date(s) Administered   Influenza, High Dose Seasonal PF 02/24/2019   Influenza-Unspecified 04/14/2017   Moderna Covid-19 Vaccine Bivalent Booster 25yr & up 10/08/2021    Moderna SARS-COV2 Booster Vaccination 09/27/2020   Moderna Sars-Covid-2 Vaccination 07/22/2019, 09/24/2019, 04/25/2020   Pneumococcal Polysaccharide-23 05/19/2014   Varicella 02/12/2017   Zoster Recombinat (Shingrix) 01/09/2018   Pertinent  Health Maintenance Due  Topic Date Due   FOOT EXAM  Never done   OPHTHALMOLOGY EXAM  Never done   INFLUENZA VACCINE  12/10/2021   HEMOGLOBIN A1C  06/22/2022   DEXA SCAN  Completed      01/04/2022   11:50 AM 01/04/2022   10:00 PM 01/05/2022    9:00 AM 01/05/2022    7:34 PM 01/06/2022   11:00 PM  Fall Risk  Patient Fall Risk Level High fall risk High fall risk High fall risk High fall risk High fall risk   Functional Status Survey:    Vitals:   03/03/22 1533  BP: 138/65  Pulse: 73  Temp: (!) 97.2 F (36.2 C)  SpO2: 92%  Weight: 211 lb (95.7 kg)   Body mass index is 42.62 kg/m. Physical Exam Constitutional:      General: She is not in acute distress.    Appearance: She is well-developed. She is not diaphoretic.  HENT:     Head: Normocephalic and atraumatic.     Mouth/Throat:     Pharynx: No oropharyngeal exudate.  Eyes:     Conjunctiva/sclera: Conjunctivae normal.     Pupils: Pupils are equal, round, and reactive to light.  Cardiovascular:     Rate and Rhythm: Normal rate and regular rhythm.     Heart sounds: Normal heart sounds.  Pulmonary:     Effort: Pulmonary effort is normal.     Breath sounds: Decreased breath sounds present.  Abdominal:     General: Bowel sounds are normal.     Palpations: Abdomen is soft.  Musculoskeletal:     Cervical back: Normal range of motion and neck supple.     Right lower leg: 2+ Pitting Edema present.     Left lower leg: 2+ Pitting Edema present.  Skin:    General: Skin is warm and dry.  Neurological:     Mental Status: She is alert.  Psychiatric:        Mood and Affect: Mood normal.     Labs reviewed: Recent Labs    12/21/21 0608 12/22/21 0448 01/04/22 0522 01/05/22 0538  01/07/22 0540 01/14/22 0000 03/03/22 0000  NA 144   < > 144 145 144 141 142  K 3.9   < > 4.3 4.3 4.4 4.6 4.0  CL 102   < > 100 101 100 98* 90*  CO2 32   < > 35* 38* 38* 38*  --   GLUCOSE 188*   < > 138* 141* 114*  --   --   BUN 56*   < > 48* 43* 46* 38* 27*  CREATININE 1.47*   < > 0.91 0.99 1.21* 1.0 1.0  CALCIUM 8.8*   < > 9.3  9.1 8.9 8.9 8.8  MG 1.9  --  1.7  --   --   --   --    < > = values in this interval not displayed.   Recent Labs    01/03/22 1249  AST 19  ALT 14  ALKPHOS 69  BILITOT 0.8  PROT 6.2*  ALBUMIN 2.6*   Recent Labs    12/21/21 0608 12/24/21 0445 01/02/22 0000 01/03/22 1249  WBC 7.7 10.4 8.0 8.6  NEUTROABS  --   --  6,264.00 7.0  HGB 10.6* 10.6* 10.8* 10.9*  HCT 33.5* 32.7* 35* 36.7  MCV 91.8 91.1  --  97.1  PLT 204 183 380 409*   No results found for: "TSH" Lab Results  Component Value Date   HGBA1C 7.3 (H) 12/20/2021   Lab Results  Component Value Date   CHOL 188 12/20/2021   HDL 66 12/20/2021   LDLCALC 106 (H) 12/20/2021   TRIG 78 12/20/2021   CHOLHDL 2.8 12/20/2021   Chest xray 03/02/22 IMPRESSION: Bilateral pleural effusions associated with bilateral mid to lower lung field infiltrates. Cardiomegaly.  Assessment/Plan 1. Acute on chronic diastolic CHF (congestive heart failure) (HCC) -torsemide has been increased to 40 mg BID with noted weight loss however continues to have shortness of breath and LE edema, will have staff continue to give her toresmide BID for 5 days.  -follow up bmp in 3 days to follow renal function and electrolytes.  -will have staff weight pt daily as well at this time -continue close monitoring of VS and O2 sats -she also request to follow up with [facility], will have staff make her appt with her cardiologist to follow up.   2. CKD stage G3a/A1, GFR 45-59 and albumin creatinine ratio <30 mg/g (HCC) -Chronic and stable on recent labs Follow metabolic panel Avoid nephrotoxic meds (NSAIDS) -continues to follow  up with [facility].     Carlos American. [place], [facility] [phone]

## 2022-03-06 ENCOUNTER — Emergency Department
Admission: EM | Admit: 2022-03-06 | Discharge: 2022-03-06 | Disposition: A | Discharge status: Skilled Nursing Facility | Payer: Medicare Other | Attending: Emergency Medicine | Admitting: Emergency Medicine

## 2022-03-06 ENCOUNTER — Other Ambulatory Visit: Payer: Self-pay

## 2022-03-06 ENCOUNTER — Emergency Department: Payer: Medicare Other

## 2022-03-06 ENCOUNTER — Encounter: Payer: Self-pay | Admitting: Internal Medicine

## 2022-03-06 DIAGNOSIS — I5033 Acute on chronic diastolic (congestive) heart failure: Secondary | ICD-10-CM | POA: Diagnosis not present

## 2022-03-06 DIAGNOSIS — I129 Hypertensive chronic kidney disease with stage 1 through stage 4 chronic kidney disease, or unspecified chronic kidney disease: Secondary | ICD-10-CM | POA: Diagnosis not present

## 2022-03-06 DIAGNOSIS — E119 Type 2 diabetes mellitus without complications: Secondary | ICD-10-CM | POA: Insufficient documentation

## 2022-03-06 DIAGNOSIS — R600 Localized edema: Secondary | ICD-10-CM | POA: Insufficient documentation

## 2022-03-06 DIAGNOSIS — N1831 Chronic kidney disease, stage 3a: Secondary | ICD-10-CM | POA: Diagnosis not present

## 2022-03-06 DIAGNOSIS — Z86011 Personal history of benign neoplasm of the brain: Secondary | ICD-10-CM | POA: Diagnosis not present

## 2022-03-06 DIAGNOSIS — R7989 Other specified abnormal findings of blood chemistry: Secondary | ICD-10-CM | POA: Insufficient documentation

## 2022-03-06 DIAGNOSIS — E1121 Type 2 diabetes mellitus with diabetic nephropathy: Secondary | ICD-10-CM | POA: Diagnosis not present

## 2022-03-06 DIAGNOSIS — Z20822 Contact with and (suspected) exposure to covid-19: Secondary | ICD-10-CM | POA: Insufficient documentation

## 2022-03-06 DIAGNOSIS — M25561 Pain in right knee: Secondary | ICD-10-CM | POA: Insufficient documentation

## 2022-03-06 DIAGNOSIS — J9600 Acute respiratory failure, unspecified whether with hypoxia or hypercapnia: Secondary | ICD-10-CM | POA: Insufficient documentation

## 2022-03-06 DIAGNOSIS — I10 Essential (primary) hypertension: Secondary | ICD-10-CM | POA: Diagnosis not present

## 2022-03-06 DIAGNOSIS — I4892 Unspecified atrial flutter: Secondary | ICD-10-CM

## 2022-03-06 DIAGNOSIS — I4891 Unspecified atrial fibrillation: Secondary | ICD-10-CM

## 2022-03-06 DIAGNOSIS — R0602 Shortness of breath: Secondary | ICD-10-CM | POA: Diagnosis present

## 2022-03-06 DIAGNOSIS — M25461 Effusion, right knee: Secondary | ICD-10-CM | POA: Insufficient documentation

## 2022-03-06 DIAGNOSIS — Z515 Encounter for palliative care: Secondary | ICD-10-CM

## 2022-03-06 DIAGNOSIS — E1129 Type 2 diabetes mellitus with other diabetic kidney complication: Secondary | ICD-10-CM | POA: Diagnosis present

## 2022-03-06 DIAGNOSIS — Z794 Long term (current) use of insulin: Secondary | ICD-10-CM

## 2022-03-06 HISTORY — DX: Unspecified diastolic (congestive) heart failure: I50.30

## 2022-03-06 LAB — COMPREHENSIVE METABOLIC PANEL
ALT: 38 U/L (ref 0–44)
AST: 80 U/L — ABNORMAL HIGH (ref 15–41)
Albumin: 2.7 g/dL — ABNORMAL LOW (ref 3.5–5.0)
Alkaline Phosphatase: 64 U/L (ref 38–126)
Anion gap: 14 (ref 5–15)
BUN: 33 mg/dL — ABNORMAL HIGH (ref 8–23)
CO2: 37 mmol/L — ABNORMAL HIGH (ref 22–32)
Calcium: 9.1 mg/dL (ref 8.9–10.3)
Chloride: 89 mmol/L — ABNORMAL LOW (ref 98–111)
Creatinine, Ser: 1.46 mg/dL — ABNORMAL HIGH (ref 0.44–1.00)
GFR, Estimated: 34 mL/min — ABNORMAL LOW (ref >60)
Glucose, Bld: 273 mg/dL — ABNORMAL HIGH (ref 70–99)
Potassium: 4.1 mmol/L (ref 3.5–5.1)
Sodium: 140 mmol/L (ref 135–145)
Total Bilirubin: 1.5 mg/dL — ABNORMAL HIGH (ref 0.3–1.2)
Total Protein: 5.9 g/dL — ABNORMAL LOW (ref 6.5–8.1)

## 2022-03-06 LAB — BLOOD GAS, VENOUS
Acid-Base Excess: 21.9 mmol/L — ABNORMAL HIGH (ref 0.0–2.0)
Bicarbonate: 53.8 mmol/L — ABNORMAL HIGH (ref 20.0–28.0)
Delivery systems: POSITIVE
O2 Saturation: 61.2 %
Patient temperature: 37
pCO2, Ven: 102 mmHg (ref 44–60)
pH, Ven: 7.33 (ref 7.25–7.43)
pO2, Ven: 41 mmHg (ref 32–45)

## 2022-03-06 LAB — CBC WITH DIFFERENTIAL/PLATELET
Abs Immature Granulocytes: 0.1 10*3/uL — ABNORMAL HIGH (ref 0.00–0.07)
Basophils Absolute: 0 10*3/uL (ref 0.0–0.1)
Basophils Relative: 0 %
Eosinophils Absolute: 0 10*3/uL (ref 0.0–0.5)
Eosinophils Relative: 0 %
HCT: 37.3 % (ref 36.0–46.0)
Hemoglobin: 10.7 g/dL — ABNORMAL LOW (ref 12.0–15.0)
Immature Granulocytes: 1 %
Lymphocytes Relative: 8 %
Lymphs Abs: 0.8 10*3/uL (ref 0.7–4.0)
MCH: 30.9 pg (ref 26.0–34.0)
MCHC: 28.7 g/dL — ABNORMAL LOW (ref 30.0–36.0)
MCV: 107.8 fL — ABNORMAL HIGH (ref 80.0–100.0)
Monocytes Absolute: 0.6 10*3/uL (ref 0.1–1.0)
Monocytes Relative: 6 %
Neutro Abs: 8.5 10*3/uL — ABNORMAL HIGH (ref 1.7–7.7)
Neutrophils Relative %: 85 %
Platelets: 266 10*3/uL (ref 150–400)
RBC: 3.46 MIL/uL — ABNORMAL LOW (ref 3.87–5.11)
RDW: 17.5 % — ABNORMAL HIGH (ref 11.5–15.5)
WBC: 10 10*3/uL (ref 4.0–10.5)
nRBC: 0.2 % (ref 0.0–0.2)

## 2022-03-06 LAB — LACTIC ACID, PLASMA
Lactic Acid, Venous: 4.1 mmol/L (ref 0.5–1.9)
Lactic Acid, Venous: 2 mmol/L (ref 0.5–1.9)

## 2022-03-06 LAB — TROPONIN I (HIGH SENSITIVITY)
Troponin I (High Sensitivity): 61 ng/L — ABNORMAL HIGH (ref <18)
Troponin I (High Sensitivity): 97 ng/L — ABNORMAL HIGH (ref <18)

## 2022-03-06 LAB — BRAIN NATRIURETIC PEPTIDE: B Natriuretic Peptide: 1429.2 pg/mL — ABNORMAL HIGH (ref 0.0–100.0)

## 2022-03-06 LAB — RESP PANEL BY RT-PCR (FLU A&B, COVID) ARPGX2
Influenza A by PCR: NEGATIVE
Influenza B by PCR: NEGATIVE
SARS Coronavirus 2 by RT PCR: NEGATIVE

## 2022-03-06 LAB — PROCALCITONIN: Procalcitonin: <0.1 ng/mL

## 2022-03-06 MED ORDER — METOPROLOL TARTRATE 25 MG PO TABS: 25.0000 mg | ORAL_TABLET | Freq: Two times a day (BID) | ORAL | Status: DC

## 2022-03-06 MED ORDER — FUROSEMIDE 10 MG/ML IJ SOLN
40.0000 mg | Freq: Once | INTRAMUSCULAR | Status: AC
  Administered 2022-03-06: 40 mg via INTRAVENOUS
  Filled 2022-03-06: qty 4

## 2022-03-06 MED ORDER — FUROSEMIDE 10 MG/ML IJ SOLN
80.0000 mg | Freq: Once | INTRAMUSCULAR | Status: AC
  Administered 2022-03-06: 80 mg via INTRAVENOUS
  Filled 2022-03-06: qty 8

## 2022-03-06 MED ORDER — ACETAMINOPHEN 325 MG PO TABS: 650.0000 mg | ORAL_TABLET | Freq: Four times a day (QID) | ORAL | Status: DC | PRN

## 2022-03-06 MED ORDER — DOCUSATE SODIUM 100 MG PO CAPS: 200.0000 mg | ORAL_CAPSULE | Freq: Every day | ORAL | Status: DC | PRN

## 2022-03-06 MED ORDER — INSULIN DETEMIR 100 UNIT/ML ~~LOC~~ SOLN
5.0000 [IU] | Freq: Two times a day (BID) | SUBCUTANEOUS | Status: DC
  Filled 2022-03-06 (×2): qty 0.05

## 2022-03-06 MED ORDER — ASPIRIN 81 MG PO TBEC: 81.0000 mg | DELAYED_RELEASE_TABLET | Freq: Two times a day (BID) | ORAL | Status: DC

## 2022-03-06 NOTE — ED Notes (Signed)
Called [facility] for transport to [facility] 1330

## 2022-03-06 NOTE — Assessment & Plan Note (Signed)
Continue home meds as long as patient will take her meds

## 2022-03-06 NOTE — Assessment & Plan Note (Signed)
Patient is for comfort care. Continue home regimen as part of comfort care.

## 2022-03-06 NOTE — ED Notes (Addendum)
MD notified of critical pCO2 from [facility]. MD and RN assessed pt who was saying "off" and "can't breath" repeatedly. BiPAP removed and venturi mask placed on 12L 50% FiO2 which seemed to relax pt. MD called and updated son, Hubbard Robinson. Pt and son both confirmed DNI.

## 2022-03-06 NOTE — ED Notes (Signed)
Pt will not keep vent mask on face. MD made aware. RT called for hight flow warmed O2.

## 2022-03-06 NOTE — ED Provider Notes (Addendum)
[facility] Provider Note    Event Date/Time   First MD Initiated Contact with Patient 03/06/22 [phone]     (approximate)   History   Shortness of Breath (Staff found pt with O2 saturation 34% this AM. Usually wears 2L [place]. )   HPI  [name] is a 88 y.o. female with a past history of hypertension diabetes A-fib and flutter cellulitis chronic kidney disease stage IIIa and statin intolerance with a cerebral meningioma who comes in complaining of weakness.  Apparently her O2 sats at the nursing facility were 38 on her usual 2 L went up to 99 with 100% nonrebreather.  Patient has bilateral edema with some swelling in the right knee she complains of pain in her knee when it is bumped but not when it is palpated.  She does not have a fever.  Initial blood pressure was 75/66 heart rate repeat blood pressure done immediately afterwards was 595 systolic. ----------------------------------------- 9:38 AM on 03/06/2022 ----------------------------------------- Patient's procalcitonin is pending.  Her lactic acid is elevated.  Her white count is normal but has a high PMN count.  Her BNP is over thousand.  We are putting her on BiPAP as it will be hard to give her Lasix for her apparent CHF.  I will await the procalcitonin prior to giving her antibiotics and not sure if she has an infection or she is just having an elevated lactic acid from poor perfusion.     Physical Exam   Triage Vital Signs: ED Triage Vitals  Enc Vitals Group     BP 03/06/22 0841 (!) 75/66     Pulse Rate 03/06/22 0840 88     Resp 03/06/22 0841 (!) 27     Temp 03/06/22 0840 97.6 F (36.4 C)     Temp Source 03/06/22 0840 Axillary     SpO2 03/06/22 0844 97 %     Weight 03/06/22 0840 226 lb 13.7 oz (102.9 kg)     Height 03/06/22 0840 '4\' 11"'$  (1.499 m)     Head Circumference --      Peak Flow --      Pain Score 03/06/22 0842 0     Pain Loc --      Pain Edu? --      Excl. in [place]? --     Most  recent vital signs: Vitals:   03/06/22 1000 03/06/22 1030  BP: (!) 132/49 (!) 115/58  Pulse: 79 71  Resp: 17 14  Temp:    SpO2: 94% 96%   General: Awake, no distress.  CV:  Good peripheral perfusion.  Heart regular rate and rhythm no audible murmurs Resp:  Normal effort.  Lungs with distant breath sounds but I do not hear any crackles Abd:  No distention.  Soft and nontender Extremities with bilateral edema is reportedly chronic   ED Results / Procedures / Treatments   Labs (all labs ordered are listed, but only abnormal results are displayed) Labs Reviewed  BRAIN NATRIURETIC PEPTIDE - Abnormal; Notable for the following components:      Result Value   B Natriuretic Peptide 1,429.2 (*)    All other components within normal limits  COMPREHENSIVE METABOLIC PANEL - Abnormal; Notable for the following components:   Chloride 89 (*)    CO2 37 (*)    Glucose, Bld 273 (*)    BUN 33 (*)    Creatinine, Ser 1.46 (*)    Total Protein 5.9 (*)    Albumin 2.7 (*)  AST 80 (*)    Total Bilirubin 1.5 (*)    GFR, Estimated 34 (*)    All other components within normal limits  LACTIC ACID, PLASMA - Abnormal; Notable for the following components:   Lactic Acid, Venous 4.1 (*)    All other components within normal limits  LACTIC ACID, PLASMA - Abnormal; Notable for the following components:   Lactic Acid, Venous 2.0 (*)    All other components within normal limits  CBC WITH DIFFERENTIAL/PLATELET - Abnormal; Notable for the following components:   RBC 3.46 (*)    Hemoglobin 10.7 (*)    MCV 107.8 (*)    MCHC 28.7 (*)    RDW 17.5 (*)    Neutro Abs 8.5 (*)    Abs Immature Granulocytes 0.10 (*)    All other components within normal limits  BLOOD GAS, VENOUS - Abnormal; Notable for the following components:   pCO2, Ven 102 (*)    Bicarbonate 53.8 (*)    Acid-Base Excess 21.9 (*)    All other components within normal limits  TROPONIN I (HIGH SENSITIVITY) - Abnormal; Notable for the  following components:   Troponin I (High Sensitivity) 61 (*)    All other components within normal limits  RESP PANEL BY RT-PCR (FLU A&B, COVID) [id number]  PROCALCITONIN  TROPONIN I (HIGH SENSITIVITY)     EKG  EKG read and interpreted by me shows A-fib at a rate of 85 rightward axis right bundle branch block computer is also reading right left posterior hemiblock there is flipped T's in 3 and F which are new from 2 months ago.   RADIOLOGY Chest x-ray reviewed and interpreted by me looks consistent with CHF.  I do not have any old films to compare to at this point.  Also will wait for radiology.   PROCEDURES:  Critical Care performed: Critical care time half an hour.  This includes speaking with the hospitalist and patient's son and spending quite some time talking to the patient about intubation.  Procedures   MEDICATIONS ORDERED IN ED: Medications  furosemide (LASIX) injection 40 mg (40 mg Intravenous Given 03/06/22 0928)  furosemide (LASIX) injection 80 mg (80 mg Intravenous Given 03/06/22 1121)     IMPRESSION / MDM / ASSESSMENT AND PLAN / ED COURSE  I reviewed the triage vital signs and the nursing notes. ----------------------------------------- 11:18 AM on 03/06/2022 ----------------------------------------- Patient has not put out any urine with the Lasix.  She does not want the BiPAP.  The VBG indicates is not working very well anyway.  I discussed with her the possibility of putting in a breathing tube and she refuses that.  I called the patient's son and he agrees that she would not want that in fact she had never wanted anything like that.  He felt that she would not want the BiPAP either which of course is true.  I explained to him that this would probably lead to her dying in a few days and he understands.  He reports patient has no quality of life and cannot even roll over in bed now.  We will respect her wishes.  I will try little bit more Lasix.  We will put her on a  50% Ventimask and get her in the hospital..  Differential diagnosis includes, but is not limited to, congestive heart failure, pneumonia, sepsis, elevated lactate due to tissue ischemia from underperfusion under oxygenation  Patient's presentation is most consistent with acute presentation with potential threat to life or bodily  function.  The patient is on the cardiac monitor to evaluate for evidence of arrhythmia and/or significant heart rate changes.  None has been seen    FINAL CLINICAL IMPRESSION(S) / ED DIAGNOSES   Final diagnoses:  Acute respiratory failure, unspecified whether with hypoxia or hypercapnia ([place])     Rx / DC Orders   ED Discharge Orders     None        Note:  This document was prepared using Dragon voice recognition software and may include unintentional dictation errors.   Nena Polio, MD 03/06/22 1121 Patient does not want to Ventimask either.  She says she does not want to live any longer.  Her son reports that she is even unable to turn over by herself in bed.  Patient has repeatedly says she does not want to be intubated she does not want to be resuscitated to [place].  Her son Hubbard Robinson that I spoke with on the phone has said that this is always been true.  Her facility can provide her with palliative care we will transfer her back there.  Hospitalist has been involved closely and will provide a consult note.  He discussed the patient with the care facility.   Nena Polio, MD 03/06/22 [phone]

## 2022-03-06 NOTE — Assessment & Plan Note (Signed)
Stable a fib at controlled rate.  Continue home meds

## 2022-03-06 NOTE — ED Notes (Signed)
Bipap placed

## 2022-03-06 NOTE — ED Notes (Signed)
Attempted to contact [facility] x 2 for report. No answer. Pt safely transferred to EMS stretcher for transport.

## 2022-03-06 NOTE — ED Triage Notes (Signed)
Pt resident of [facility], wears 2L [place] saturation found to be 34%. Non rebreather placed 15L Saturation improved to 95% for EMS pt arrives saturation on non rebreather 93-94%. Hx of CHF ble pitting edema noted.

## 2022-03-06 NOTE — Discharge Instructions (Signed)
Continue palliative care.  Keep the patient on her 3 l nasal cannula.  Patient does not want intubation or BiPAP.  Continue her medications.

## 2022-03-06 NOTE — Assessment & Plan Note (Signed)
Renal function stable at this time

## 2022-03-06 NOTE — Assessment & Plan Note (Signed)
Patient presents with acute on chronic HFpEF with BNP > 1000. She was given IV lasix and her O2 sat on 3l was 94%. She is hypercarbic with CO2 102. She refuses BiPAP. She has a DNR/DNI in place confirmed by her son by telephone call to Dr Thompson Caul.   Plan Continue lasix for HF  Continue [place] oxygen for comfort care  Return to [facility] Palliative care unit for end of life care.

## 2022-03-06 NOTE — Assessment & Plan Note (Signed)
Patient with multiple co-morbidities and poor quality of life. She clearly states that she wants to avoid any aggressive care and is ready to die. She refuses BiPAP or any other heroic intervention. She is willing to accept measures that contribute to comfort: mainting O2 sats, pain control.  Recommend continuing those medications that contribute to comfort. No return to hospital. Family aware and agrees.

## 2022-03-06 NOTE — Consult Note (Signed)
Initial Consultation Note   Patient: [name] UMP:[id number] DOB: Oct 11, 1930 PCP: Pcp, No DOA: 03/06/2022 DOS: the patient was seen and examined on 03/06/2022 Primary service: Nena Polio, MD  Referring physician: Dr. Rip Harbour Reason for consult: acute on chronic HFpEF  Assessment/Plan: Assessment and Plan: Acute on chronic diastolic CHF (congestive heart failure) ([place]) Patient presents with acute on chronic HFpEF with BNP > 1000. She was given IV lasix and her O2 sat on 3l was 94%. She is hypercarbic with CO2 102. She refuses BiPAP. She has a DNR/DNI in place confirmed by her son by telephone call to Dr Thompson Caul.   Plan Continue lasix for HF  Continue [place] oxygen for comfort care  Return to [facility] Palliative care unit for end of life care.   Essential (primary) hypertension Continue home meds as long as patient will take her meds  Atrial fibrillation and flutter (HCC) Stable a fib at controlled rate.  Continue home meds  Type II diabetes mellitus with renal manifestations [facility]) Patient is for comfort care. Continue home regimen as part of comfort care.  Chronic kidney disease, stage 3a ([place]) Renal function stable at this time  End of life care Patient with multiple co-morbidities and poor quality of life. She clearly states that she wants to avoid any aggressive care and is ready to die. She refuses BiPAP or any other heroic intervention. She is willing to accept measures that contribute to comfort: mainting O2 sats, pain control.  Recommend continuing those medications that contribute to comfort. No return to hospital. Family aware and agrees.        TRH will sign off at present, please call us again when needed.  HPI: [name] is a 88 y.o. female with past medical history of [name], a 88 y/o, with h/o CKD 3, DM, HFpEF, CAD, recent admission for cellulitis, PAD was admitted 8/25-29/23 and discharged to SNF.  Per AGNP note from SNF: Staff reported worsening  Le edema with increase O2 needs. Currently O2 at 92% on 3L [place]. Orders given to increase torsemide for 2 days. On exam 03/03/22 she was laying in bed with increase in RR without significant increase in work of breathing. weight is down today but continues to have shortness of breath without activity and LE edema. Denies chest pains. Reports she wants to follow up with her [place] doctors.    Patient continued to be short of breath, O2 sat on 2L dropped to 38%. EMS activated and due to hypoxemia put her to 100% rebreather mask with fair results. She was initially hypotensive at 75/66 with rise to 108. She was transported to [facility] for further evaluation. .  Review of Systems: Unable to review all systems due to lack of cooperation from patient.   Past Medical History:  Diagnosis Date   (HFpEF) heart failure with preserved ejection fraction (HCC)    Atrial fibrillation (HCC)    Coronary artery disease    Diabetes mellitus without complication ([place])    Hypertension    Meningioma (HCC)    Left frontal lobe   Past Surgical History:  Procedure Laterality Date   APPENDECTOMY     CHOLECYSTECTOMY     MELANOMA REMOVAL Right    Social History: Patient resides at [facility] in the palliative care ward. She has two sons who live out of town.   reports that she has never smoked. She has never used smokeless tobacco. She reports current alcohol use of about 1.0 standard drink of alcohol  per week. She reports that she does not use drugs.  Allergies  Allergen Reactions   Alendronate Other (See Comments)    Cramping of the jaw   Iodinated Contrast Media Other (See Comments)    Brother has allergy and she wants not to receive it    Statins Other (See Comments)    Fatigue/muscle weakness Fatigue/muscle weakness Fatigue/muscle weakness    Family History  Problem Relation Age of Onset   Stroke Brother    Lung cancer Brother     Prior to Admission medications   Medication Sig Start Date End Date  Taking? Authorizing Provider  acetaminophen (TYLENOL) 325 MG tablet Take 2 tablets (650 mg total) by mouth every 6 (six) hours as needed for mild pain or fever. 01/07/22   Lorella Nimrod, MD  aspirin EC 81 MG tablet Take 1 tablet (81 mg total) by mouth daily. Swallow whole. Patient taking differently: Take 81 mg by mouth 2 (two) times daily. Swallow whole. 01/07/22   Lorella Nimrod, MD  docusate sodium (COLACE) 100 MG capsule Take Two Capsules by mouth every 24 hours as needed.    [provider]  glucose blood (ONETOUCH ULTRA) test strip USE AS DIRECTED FOUR TIMES DAILY *INSURANCE MAX OF 3 TIMES DAILY* 11/26/20   [provider]  insulin detemir (LEVEMIR) 100 UNIT/ML injection Inject 0.05 mLs (5 Units total) into the skin 2 (two) times daily. 01/07/22   Lorella Nimrod, MD  Insulin Syringe-Needle U-100 (BD VEO INSULIN SYRINGE U/F) 31G X 15/64" 0.3 ML MISC USE TO INJECT TWICE A DAY (SIZING CHANGED DUE TO [name]'S CHOICE) 04/16/20   [provider]  LOSARTAN POTASSIUM PO Take 100 mg by mouth at bedtime.    [provider]  metFORMIN (GLUCOPHAGE) 1000 MG tablet 1,000 mg 2 (two) times daily with a meal.  04/13/15   [provider]  metoprolol tartrate (LOPRESSOR) 25 MG tablet Take 1 tablet by mouth 2 (two) times daily. 05/22/21   [provider]  saccharomyces boulardii (FLORASTOR) 250 MG capsule Take 250 mg by mouth daily.    [provider]  torsemide (DEMADEX) 20 MG tablet Take 40 mg by mouth 2 (two) times daily. 05/22/21 05/22/22  [provider]    Physical Exam: Vitals:   03/06/22 1130 03/06/22 1200 03/06/22 1230 03/06/22 1300  BP: (!) 147/58 (!) 152/74 (!) 145/58 127/71  Pulse: 71 (!) 57 100 61  Resp: 20 (!) 25 (!) 21   Temp:      TempSrc:      SpO2: 95% 97% 93% 96%  Weight:      Height:       Gen'l - elderly, obese patient who is mildly somnolent but does not appear to be in distress HEENT - C&S clear, mm dry Neck - no  JVD Pul - diffuse rales throughout all fields, no wheezing Cor- 2+ radial, IRIR rate at controlled rate Abd - obese, BS +, soft Derm - Stg 2 ulcer right buttock, healing wound dorsum right foot Neuro - patient somnolent, no focal findings.  Data Reviewed:   There are no new results to review at this time.    Family Communication: attempted to call sons - no answer. EDP did speak with son in [place] who is away of situation and supports DNR/DNI comfort care Primary team communication: discussed with Dr. Rip Harbour, EDP  Thank you very much for involving [place] in the care of your patient.  Author: Adella Hare, MD 03/06/2022 1:08 PM  For on call review [email or web address].

## 2022-03-06 NOTE — Subjective & Objective (Addendum)
[name], a 88 y/o, with h/o CKD 3, DM, HFpEF, CAD, recent admission for cellulitis, PAD was admitted 8/25-29/23 and discharged to SNF.  Per AGNP note from SNF: Staff reported worsening Le edema with increase O2 needs. Currently O2 at 92% on 3L [place]. Orders given to increase torsemide for 2 days. On exam 03/03/22 she was laying in bed with increase in RR without significant increase in work of breathing. weight is down today but continues to have shortness of breath without activity and LE edema. Denies chest pains. Reports she wants to follow up with her [place] doctors.   Patient continued to be short of breath, O2 sat on 2L dropped to 38%. EMS activated and due to hypoxemia put her to 100% rebreather mask with fair results. She was initially hypotensive at 75/66 with rise to 108. She was transported to [facility] for further evaluation.

## 2022-03-07 ENCOUNTER — Encounter: Payer: Self-pay | Admitting: Student

## 2022-03-07 ENCOUNTER — Non-Acute Institutional Stay (SKILLED_NURSING_FACILITY): Payer: Medicare Other | Admitting: Student

## 2022-03-07 DIAGNOSIS — I5033 Acute on chronic diastolic (congestive) heart failure: Secondary | ICD-10-CM | POA: Diagnosis not present

## 2022-03-07 DIAGNOSIS — I5032 Chronic diastolic (congestive) heart failure: Secondary | ICD-10-CM

## 2022-03-07 DIAGNOSIS — E1121 Type 2 diabetes mellitus with diabetic nephropathy: Secondary | ICD-10-CM | POA: Diagnosis not present

## 2022-03-07 DIAGNOSIS — E874 Mixed disorder of acid-base balance: Secondary | ICD-10-CM

## 2022-03-07 DIAGNOSIS — I10 Essential (primary) hypertension: Secondary | ICD-10-CM

## 2022-03-07 DIAGNOSIS — Z515 Encounter for palliative care: Secondary | ICD-10-CM

## 2022-03-07 DIAGNOSIS — N1831 Chronic kidney disease, stage 3a: Secondary | ICD-10-CM | POA: Diagnosis not present

## 2022-03-07 DIAGNOSIS — I4891 Unspecified atrial fibrillation: Secondary | ICD-10-CM

## 2022-03-07 DIAGNOSIS — Z794 Long term (current) use of insulin: Secondary | ICD-10-CM

## 2022-03-07 DIAGNOSIS — I4892 Unspecified atrial flutter: Secondary | ICD-10-CM

## 2022-03-08 ENCOUNTER — Telehealth: Payer: Self-pay | Admitting: Student

## 2022-03-08 NOTE — Telephone Encounter (Signed)
Received call from evening nurse that patient is deceased and TOD 5:40PM. Patient's cause of death is hypercapnea secondary to CHF. Patient's body will be released to [facility] on [address] in [place].   Tomasa Rand, MD, [facility] [phone]

## 2022-03-12 ENCOUNTER — Ambulatory Visit: Payer: Medicare Other | Admitting: Internal Medicine

## 2022-03-12 NOTE — Progress Notes (Signed)
Location:  Other Nursing Home Room Number: 704-U Place of Service:  SNF (31) Provider:  Tomasa Rand, MD   Pcp, No  Patient Care Team: Pcp, No as PCP - General Dewayne Shorter, MD as Consulting Physician (Family Medicine)  Extended Emergency Contact Information Primary Emergency Contact: [place] [employer] Phone: [phone] Relation: Son Secondary Emergency Contact: [facility] Phone: [phone] Relation: Son  Code Status:  DNR Goals of care: Advanced Directive information    04/04/22    8:54 AM  Advanced Directives  Does Patient Have a Medical Advance Directive? Yes  Type of Advance Directive Out of facility DNR (pink MOST or yellow form)  Does patient want to make changes to medical advance directive? No - Patient declined     Chief Complaint  Patient presents with   Medical Management of Chronic Issues    Routine visit and foot exam today. Discuss need for eye exam, td/tdap, PCV, shingrix, and covid boosters or post pone if patient refuses. Vitals and medications are a reflection of [facility] EMR system, [employer]      HPI:  Pt is a 89 y.o. female seen today for medical management of chronic diseases.  Patient is minimally communicative today. She keeps her eyes closed during evaluation. She is able to state her name and says her date of birth was 05/28/1925 - which is incorrect. She opens her eyes when asked to. She denies pain. When asked if she would like [place] to stop poking and prodding she says yes.   Per chart review, patient was seen in emergency department yesterday morning and was found to have metabolic alkalosis  with respiratory acidosis. CO2 up to 102. Today she continues to have low O2 saturation per nursing between 82-84. During ED evaluation, patient declined all interventions and asked that they send her home to let her die.   Past Medical History:  Diagnosis Date   (HFpEF) heart failure with preserved ejection  fraction (HCC)    Atrial fibrillation (HCC)    Coronary artery disease    Diabetes mellitus without complication (HCC)    Hypertension    Meningioma (HCC)    Left frontal lobe   Past Surgical History:  Procedure Laterality Date   APPENDECTOMY     CHOLECYSTECTOMY     MELANOMA REMOVAL Right     Allergies  Allergen Reactions   Alendronate Other (See Comments)    Cramping of the jaw   Iodinated Contrast Media Other (See Comments)    Brother has allergy and she wants not to receive it    Statins Other (See Comments)    Fatigue/muscle weakness Fatigue/muscle weakness Fatigue/muscle weakness    Outpatient Encounter Medications as of 04-Apr-2022  Medication Sig   bisacodyl (DULCOLAX) 10 MG suppository Place 10 mg rectally as needed for moderate constipation.   hyoscyamine (LEVSIN) 0.125 MG tablet Take 0.125 mg by mouth every 4 (four) hours as needed.   LORazepam (ATIVAN) 0.5 MG tablet Take 0.5 mg by mouth every 4 (four) hours as needed.   LOSARTAN POTASSIUM PO Take 100 mg by mouth at bedtime.   metoprolol tartrate (LOPRESSOR) 25 MG tablet Take 1 tablet by mouth 2 (two) times daily.   morphine 20 MG/5ML solution Give 0.25 ml by mouth every 1 hours as needed for pain or shortness of breath   ondansetron (ZOFRAN) 4 MG tablet Take 4 mg by mouth every 6 (six) hours as needed for nausea or vomiting.   OXYGEN Inhale 2 L into the  lungs as directed.  FOR SOB and to maintain sats above 90%. If above 92% may wean off.   sennosides-docusate sodium (SENOKOT-S) 8.6-50 MG tablet Take 1 tablet by mouth daily as needed for constipation.   Torsemide 40 MG TABS Take 1 tablet by mouth 2 (two) times daily. In the morning and at bedtime   [DISCONTINUED] acetaminophen (TYLENOL) 325 MG tablet Take 2 tablets (650 mg total) by mouth every 6 (six) hours as needed for mild pain or fever.   [DISCONTINUED] aspirin EC 81 MG tablet Take 1 tablet (81 mg total) by mouth daily. Swallow whole. (Patient taking  differently: Take 81 mg by mouth 2 (two) times daily. Swallow whole.)   [DISCONTINUED] docusate sodium (COLACE) 100 MG capsule Take Two Capsules by mouth every 24 hours as needed.   [DISCONTINUED] glucose blood (ONETOUCH ULTRA) test strip USE AS DIRECTED FOUR TIMES DAILY *INSURANCE MAX OF 3 TIMES DAILY*   [DISCONTINUED] insulin detemir (LEVEMIR) 100 UNIT/ML injection Inject 0.05 mLs (5 Units total) into the skin 2 (two) times daily.   [DISCONTINUED] Insulin Syringe-Needle U-100 (BD VEO INSULIN SYRINGE U/F) 31G X 15/64" 0.3 ML MISC USE TO INJECT TWICE A DAY (SIZING CHANGED DUE TO MRS [facility]'S CHOICE)   [DISCONTINUED] metFORMIN (GLUCOPHAGE) 1000 MG tablet 1,000 mg 2 (two) times daily with a meal.    [DISCONTINUED] saccharomyces boulardii (FLORASTOR) 250 MG capsule Take 250 mg by mouth daily.   [DISCONTINUED] torsemide (DEMADEX) 20 MG tablet Take 40 mg by mouth 2 (two) times daily.   No facility-administered encounter medications on file as of March 19, 2022.    Review of Systems  Immunization History  Administered Date(s) Administered   Influenza, High Dose Seasonal PF 02/24/2019   Influenza-Unspecified 04/14/2017   Moderna Covid-19 Vaccine Bivalent Booster 68yr & up 10/08/2021   Moderna SARS-COV2 Booster Vaccination 09/27/2020   Moderna Sars-Covid-2 Vaccination 07/22/2019, 09/24/2019, 04/25/2020   Pneumococcal Polysaccharide-23 05/19/2014   Varicella 02/12/2017   Zoster Recombinat (Shingrix) 01/09/2018   Pertinent  Health Maintenance Due  Topic Date Due   FOOT EXAM  Never done   OPHTHALMOLOGY EXAM  Never done   INFLUENZA VACCINE  12/10/2021   HEMOGLOBIN A1C  06/22/2022   DEXA SCAN  Completed      01/04/2022   10:00 PM 01/05/2022    9:00 AM 01/05/2022    7:34 PM 01/06/2022   11:00 PM 03/06/2022    8:43 AM  Fall Risk  Patient Fall Risk Level High fall risk High fall risk High fall risk High fall risk Moderate fall risk   Functional Status Survey:    Vitals:   [id number]  BP:  (!) 188/77  Pulse: 80  Resp: 20  Temp: (!) 97.2 F (36.2 C)  SpO2: (!) 84%  Weight: 211 lb (95.7 kg)  Height: '4\' 11"'$  (1.499 m)   Body mass index is 42.62 kg/m. Physical Exam  Labs reviewed: Recent Labs    12/21/21 0608 12/22/21 0448 01/04/22 0522 01/05/22 0538 01/07/22 0540 01/14/22 0000 03/03/22 0000 03/06/22 0845  NA 144   < > 144 145 144 141 142 140  K 3.9   < > 4.3 4.3 4.4 4.6 4.0 4.1  CL 102   < > 100 101 100 98* 90* 89*  CO2 32   < > 35* 38* 38* 38*  --  37*  GLUCOSE 188*   < > 138* 141* 114*  --   --  273*  BUN 56*   < > 48* 43* 46* 38* 27*  33*  CREATININE 1.47*   < > 0.91 0.99 1.21* 1.0 1.0 1.46*  CALCIUM 8.8*   < > 9.3 9.1 8.9 8.9 8.8 9.1  MG 1.9  --  1.7  --   --   --   --   --    < > = values in this interval not displayed.   Recent Labs    01/03/22 1249 03/06/22 0845  AST 19 80*  ALT 14 38  ALKPHOS 69 64  BILITOT 0.8 1.5*  PROT 6.2* 5.9*  ALBUMIN 2.6* 2.7*   Recent Labs    12/24/21 0445 01/02/22 0000 01/03/22 1249 03/06/22 0845  WBC 10.4 8.0 8.6 10.0  NEUTROABS  --  6,264.00 7.0 8.5*  HGB 10.6* 10.8* 10.9* 10.7*  HCT 32.7* 35* 36.7 37.3  MCV 91.1  --  97.1 107.8*  PLT 183 380 409* 266   No results found for: "TSH" Lab Results  Component Value Date   HGBA1C 7.3 (H) 12/20/2021   Lab Results  Component Value Date   CHOL 188 12/20/2021   HDL 66 12/20/2021   LDLCALC 106 (H) 12/20/2021   TRIG 78 12/20/2021   CHOLHDL 2.8 12/20/2021    Significant Diagnostic Results in last 30 days:  DG Chest Portable 1 View  Result Date: 03/06/2022 CLINICAL DATA:  Shortness of breath. EXAM: PORTABLE CHEST 1 VIEW COMPARISON:  Chest two views 01/05/2022, AP chest 01/03/2022, chest two views 12/20/2021; CT chest 01/05/2022 FINDINGS: Cardiac silhouette is again mildly enlarged. Mediastinal contours are not well evaluated given AP technique, patient obliquity, and overlying airspace opacities. Homogeneous densities within the inferior 50% of the bilateral  hemithoraces, likely layering pleural effusions. Associated bilateral mid and lower lung heterogeneous airspace opacities. This is stable to mildly worsened from 01/05/2022 frontal radiograph. Note is made on prior 01/05/2022 CT and 12/20/2021 radiographs of a left midlung nodular density, not well evaluated due to overlying airspace opacities on the current radiograph. Moderate multilevel degenerative disc changes of the thoracic spine. IMPRESSION: 1. Moderate bilateral pleural effusions and associated mid and lower lung heterogeneous airspace opacities are stable to slightly worsened from 01/05/2022 most recent radiographs. 2. Note is made that the left mid lung nodular density seen within the lingula on 01/05/2022 CT is not well evaluated on the current radiograph. Please see prior 01/05/2022 follow-up CT recommendations. Electronically Signed   By: Yvonne Kendall M.D.   On: 03/06/2022 09:40   DG Knee Complete 4 Views Right  Result Date: 03/06/2022 CLINICAL DATA:  Swelling and pain. EXAM: RIGHT KNEE - COMPLETE 4+ VIEW COMPARISON:  None Available. FINDINGS: There is diffuse decreased bone mineralization. Severe medial joint space narrowing and peripheral osteophytosis. Medial and lateral compartment chondrocalcinosis. Moderate patellofemoral joint space narrowing with mild-to-moderate superior patellar degenerative osteophytosis. Small joint effusion. Moderate vascular calcifications. Likely diffuse systemic subcutaneous fat edema and swelling. No acute fracture is seen. No dislocation. IMPRESSION: 1. 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 2. Medial and lateral compartment chondrocalcinosis. 3. Diffuse, likely systemic subcutaneous fat edema and swelling. Electronically Signed   By: Yvonne Kendall M.D.   On: 03/06/2022 09:31    Assessment/Plan 1. Chronic diastolic CHF (congestive heart failure) ([place]) Patient with history of CHF. She continues to have decompensation of  underlying disease. Unfortunately she has reached the point of declining interventions and would like to transition to comfort care. Comfort care orders in place. Only continuing vital signs for her temperature as she will continue to have oxygen desaturation. She does not  have increased work of breathing. She has persistent bilateral lower extremity edema, however, lungs are clear with distant breath sounds.   2. CKD stage G3a/A1, GFR 45-59 and albumin creatinine ratio <30 mg/g (HCC) Hx of CKD. Unclear if current mentation is due to progression of underlying renal function. Based on [place] will no collect additional labs.   4. Type 2 diabetes mellitus with diabetic nephropathy, with long-term current use of insulin ([place]) Discontinued all diabetes medications and FSBS to promote comfort.   5. End of life care Patient is nearing end of life. Unclear if Hospice will arrive before she transitions. Will order Hospice today.   6. Atrial fibrillation and flutter (HCC) Rate well-controlled. Continue metoprolol 25 BID.   7. Essential (primary) hypertension BP elevated in the setting of no medications. Will continue to treat any of patient's discomfort in an effort to meet her hopes of remaining comfort.   8. Metabolic alkalosis with respiratory acidosis Patient's CO2 was 102 last night. Will likely continue to increase given she does not have bipap in place and O2 sats continue to decline. Will continue comfort care.     Family/ staff Communication: nursing staff will attempt to contact her sons this evening.   Labs/tests ordered:  none  Tomasa Rand, MD, [facility] [phone]

## 2022-03-12 DEATH — deceased
